# Patient Record
Sex: Female | Born: 1940 | Race: White | Hispanic: No | Smoking: Former smoker
Health system: Southern US, Community
[De-identification: ages and names within clinical notes are randomized; demographics above are authoritative.]

## PROBLEM LIST (undated history)

## (undated) DIAGNOSIS — F329 Major depressive disorder, single episode, unspecified: Secondary | ICD-10-CM

## (undated) DIAGNOSIS — R609 Edema, unspecified: Secondary | ICD-10-CM

## (undated) DIAGNOSIS — K219 Gastro-esophageal reflux disease without esophagitis: Secondary | ICD-10-CM

## (undated) DIAGNOSIS — F32A Depression, unspecified: Secondary | ICD-10-CM

## (undated) DIAGNOSIS — H9190 Unspecified hearing loss, unspecified ear: Secondary | ICD-10-CM

## (undated) DIAGNOSIS — R197 Diarrhea, unspecified: Secondary | ICD-10-CM

## (undated) DIAGNOSIS — L659 Nonscarring hair loss, unspecified: Secondary | ICD-10-CM

## (undated) DIAGNOSIS — M1990 Unspecified osteoarthritis, unspecified site: Secondary | ICD-10-CM

## (undated) DIAGNOSIS — E785 Hyperlipidemia, unspecified: Secondary | ICD-10-CM

## (undated) DIAGNOSIS — T7840XA Allergy, unspecified, initial encounter: Secondary | ICD-10-CM

## (undated) DIAGNOSIS — K52831 Collagenous colitis: Secondary | ICD-10-CM

## (undated) DIAGNOSIS — J45909 Unspecified asthma, uncomplicated: Secondary | ICD-10-CM

## (undated) DIAGNOSIS — I10 Essential (primary) hypertension: Secondary | ICD-10-CM

## (undated) DIAGNOSIS — F419 Anxiety disorder, unspecified: Secondary | ICD-10-CM

## (undated) HISTORY — DX: Hyperlipidemia, unspecified: E78.5

## (undated) HISTORY — DX: Collagenous colitis: K52.831

## (undated) HISTORY — DX: Nonscarring hair loss, unspecified: L65.9

## (undated) HISTORY — PX: SPINE SURGERY: SHX786

## (undated) HISTORY — DX: Edema, unspecified: R60.9

## (undated) HISTORY — DX: Allergy, unspecified, initial encounter: T78.40XA

## (undated) HISTORY — DX: Major depressive disorder, single episode, unspecified: F32.9

## (undated) HISTORY — DX: Unspecified osteoarthritis, unspecified site: M19.90

## (undated) HISTORY — PX: ABDOMINAL HYSTERECTOMY: SHX81

## (undated) HISTORY — DX: Essential (primary) hypertension: I10

## (undated) HISTORY — DX: Depression, unspecified: F32.A

---

## 2014-08-28 ENCOUNTER — Telehealth: Payer: Self-pay | Admitting: Family Medicine

## 2014-09-03 NOTE — Telephone Encounter (Signed)
Several attempts have been made to contact this patient and no answer. This encounter will be closed

## 2014-09-06 ENCOUNTER — Telehealth: Payer: Self-pay | Admitting: General Practice

## 2014-09-10 NOTE — Telephone Encounter (Signed)
Patient has humana. Patient is currently taking amlodipine, trazadone, wellbutrin, patanal, and furosemide. Patient given appointment for 4/28 with Tiffany and then patient would then like to transfer to one of the MD's.

## 2014-09-11 ENCOUNTER — Telehealth: Payer: Self-pay | Admitting: Family Medicine

## 2014-09-11 NOTE — Telephone Encounter (Signed)
Appt made per patient request 

## 2014-09-26 ENCOUNTER — Ambulatory Visit: Payer: Self-pay | Admitting: Physician Assistant

## 2014-10-09 ENCOUNTER — Ambulatory Visit (INDEPENDENT_AMBULATORY_CARE_PROVIDER_SITE_OTHER): Payer: Commercial Managed Care - HMO | Admitting: Family Medicine

## 2014-10-09 ENCOUNTER — Encounter: Payer: Self-pay | Admitting: Family Medicine

## 2014-10-09 ENCOUNTER — Encounter (INDEPENDENT_AMBULATORY_CARE_PROVIDER_SITE_OTHER): Payer: Self-pay

## 2014-10-09 VITALS — BP 148/82 | HR 85 | Temp 97.7°F | Ht 65.0 in | Wt 143.0 lb

## 2014-10-09 DIAGNOSIS — L989 Disorder of the skin and subcutaneous tissue, unspecified: Secondary | ICD-10-CM | POA: Diagnosis not present

## 2014-10-09 DIAGNOSIS — M25561 Pain in right knee: Secondary | ICD-10-CM

## 2014-10-09 DIAGNOSIS — R05 Cough: Secondary | ICD-10-CM

## 2014-10-09 DIAGNOSIS — M1990 Unspecified osteoarthritis, unspecified site: Secondary | ICD-10-CM | POA: Insufficient documentation

## 2014-10-09 DIAGNOSIS — F32A Depression, unspecified: Secondary | ICD-10-CM | POA: Insufficient documentation

## 2014-10-09 DIAGNOSIS — I10 Essential (primary) hypertension: Secondary | ICD-10-CM | POA: Insufficient documentation

## 2014-10-09 DIAGNOSIS — F329 Major depressive disorder, single episode, unspecified: Secondary | ICD-10-CM | POA: Insufficient documentation

## 2014-10-09 DIAGNOSIS — T7840XA Allergy, unspecified, initial encounter: Secondary | ICD-10-CM | POA: Insufficient documentation

## 2014-10-09 DIAGNOSIS — R059 Cough, unspecified: Secondary | ICD-10-CM

## 2014-10-09 DIAGNOSIS — L659 Nonscarring hair loss, unspecified: Secondary | ICD-10-CM | POA: Insufficient documentation

## 2014-10-09 MED ORDER — FUROSEMIDE 20 MG PO TABS: 20.0000 mg | ORAL_TABLET | Freq: Two times a day (BID) | ORAL | Status: DC

## 2014-10-09 MED ORDER — AMLODIPINE BESYLATE 5 MG PO TABS: 5.0000 mg | ORAL_TABLET | Freq: Every day | ORAL | Status: DC

## 2014-10-09 MED ORDER — BUPROPION HCL ER (SR) 150 MG PO TB12: 150.0000 mg | ORAL_TABLET | Freq: Two times a day (BID) | ORAL | Status: DC

## 2014-10-09 MED ORDER — HYDROCOD POLST-CPM POLST ER 10-8 MG/5ML PO SUER: 5.0000 mL | Freq: Two times a day (BID) | ORAL | Status: DC | PRN

## 2014-10-09 MED ORDER — TRAZODONE HCL 150 MG PO TABS: 150.0000 mg | ORAL_TABLET | Freq: Every day | ORAL | Status: DC

## 2014-10-09 MED ORDER — OMEPRAZOLE 20 MG PO CPDR: 20.0000 mg | DELAYED_RELEASE_CAPSULE | Freq: Every day | ORAL | Status: DC

## 2014-10-09 MED ORDER — MECLIZINE HCL 25 MG PO TABS: 25.0000 mg | ORAL_TABLET | Freq: Three times a day (TID) | ORAL | Status: DC | PRN

## 2014-10-09 MED ORDER — MELOXICAM 7.5 MG PO TABS: 7.5000 mg | ORAL_TABLET | Freq: Every day | ORAL | Status: DC

## 2014-10-09 NOTE — Progress Notes (Signed)
   Subjective:    Patient ID: Pamela Hughes, female    DOB: 1940-09-10, 74 y.o.   MRN: [id number]  HPI 74 year old female, first visit here changing doctors because insurance changed. We talked mostly today about her cough which seems to occur every spring and last for 1 or 2 months. In reviewing her records from her previous doctor she has been on antibiotics cough syrup steroids and multiple antihistamines. Cough is accompanied by itching eyes and sneezing and nasal discharge. She has a past history of asthma  She also has a history of hypertension chronic renal disease GERD, anxiety, hair loss, depression, and lumbar spine surgery. She will request her medicines be refilled.    Review of Systems  Constitutional: Negative.   HENT: Positive for congestion and sneezing.   Respiratory: Positive for cough.   Cardiovascular: Negative.   Gastrointestinal: Negative.   Genitourinary: Negative.   Neurological: Negative.   Psychiatric/Behavioral: Negative.        Objective:   Physical Exam  Constitutional: She is oriented to person, place, and time. She appears well-developed and well-nourished.  HENT:  Head: Normocephalic.  Cardiovascular: Normal rate and regular rhythm.   Pulmonary/Chest: Effort normal and breath sounds normal.  Neurological: She is alert and oriented to person, place, and time.  Psychiatric: She has a normal mood and affect.          Assessment & Plan:  1. Cough Office more than likely related to allergies or seasonal. She's been on multiple medications including antibiotics antihistamines steroids all with minimal success. One thing that does help his Tussionex so we'll refill that. I've also asked her to pick up some Allegra over-the-counter. We need to get her back for a complete physical. I have ordered some blood work for later this week for back and get a chance to review her old records in more detail.  Patient Active Problem List   Diagnosis Date Noted    . Hypertension   . Depression   . Allergy   . Alopecia   . Arthritis    Outpatient Encounter Prescriptions as of 10/09/2014  Medication Sig  . amLODipine (NORVASC) 5 MG tablet Take 1 tablet by mouth daily.  Marland Kitchen. buPROPion (WELLBUTRIN SR) 150 MG 12 hr tablet Take 1 tablet by mouth 2 (two) times daily.  . cholecalciferol (VITAMIN D) 1000 UNITS tablet Take 1,000 Units by mouth daily.  . cyclobenzaprine (FLEXERIL) 10 MG tablet Take 10 mg by mouth daily as needed.  . furosemide (LASIX) 20 MG tablet Take 1 tablet by mouth 2 (two) times daily.  . hydroxychloroquine (PLAQUENIL) 200 MG tablet Take 1 tablet by mouth 2 (two) times daily.  Marland Kitchen. ipratropium (ATROVENT) 0.03 % nasal spray Place 1 spray into the nose daily as needed.  . meclizine (ANTIVERT) 25 MG tablet Take 25 mg by mouth 3 (three) times daily as needed for dizziness.  . meloxicam (MOBIC) 7.5 MG tablet Take 7.5 mg by mouth daily.  . Multiple Vitamin (MULTIVITAMIN WITH MINERALS) TABS tablet Take 1 tablet by mouth daily.  . Omega-3 Fatty Acids (FISH OIL PO) Take by mouth.  Marland Kitchen. omeprazole (PRILOSEC) 20 MG capsule Take 1 capsule by mouth daily.  . traZODone (DESYREL) 150 MG tablet Take 1 tablet by mouth at bedtime.   No facility-administered encounter medications on file as of 10/09/2014.

## 2014-10-11 ENCOUNTER — Telehealth: Payer: Self-pay | Admitting: Family Medicine

## 2014-10-11 NOTE — Telephone Encounter (Signed)
Patient has had one visit for a cough.  No other visits documented  Epic.  She is requesting medication refills.

## 2014-10-14 ENCOUNTER — Telehealth: Payer: Self-pay | Admitting: Family Medicine

## 2014-10-14 ENCOUNTER — Other Ambulatory Visit: Payer: Self-pay

## 2014-10-14 DIAGNOSIS — M25562 Pain in left knee: Secondary | ICD-10-CM

## 2014-10-14 MED ORDER — IPRATROPIUM BROMIDE 0.03 % NA SOLN: 1.0000 | Freq: Every day | NASAL | Status: DC | PRN

## 2014-10-14 MED ORDER — AMLODIPINE BESYLATE 5 MG PO TABS: 5.0000 mg | ORAL_TABLET | Freq: Every day | ORAL | Status: DC

## 2014-10-16 NOTE — Telephone Encounter (Signed)
I thought I refilled these 1 or 2 days ago but okay to do if I did not

## 2014-11-08 ENCOUNTER — Other Ambulatory Visit: Payer: Self-pay | Admitting: Family Medicine

## 2014-12-06 ENCOUNTER — Telehealth: Payer: Self-pay | Admitting: Family Medicine

## 2014-12-06 ENCOUNTER — Other Ambulatory Visit (INDEPENDENT_AMBULATORY_CARE_PROVIDER_SITE_OTHER): Payer: Commercial Managed Care - HMO

## 2014-12-06 DIAGNOSIS — I10 Essential (primary) hypertension: Secondary | ICD-10-CM | POA: Diagnosis not present

## 2014-12-07 LAB — BMP8+EGFR
BUN / CREAT RATIO: 12 (ref 11–26)
BUN: 16 mg/dL (ref 8–27)
CO2: 25 mmol/L (ref 18–29)
Calcium: 9.3 mg/dL (ref 8.7–10.3)
Chloride: 96 mmol/L — ABNORMAL LOW (ref 97–108)
Creatinine, Ser: 1.3 mg/dL — ABNORMAL HIGH (ref 0.57–1.00)
GFR calc non Af Amer: 41 mL/min/{1.73_m2} — ABNORMAL LOW (ref >59)
GFR, EST AFRICAN AMERICAN: 47 mL/min/{1.73_m2} — AB (ref >59)
GLUCOSE: 83 mg/dL (ref 65–99)
POTASSIUM: 4.5 mmol/L (ref 3.5–5.2)
Sodium: 138 mmol/L (ref 134–144)

## 2014-12-08 NOTE — Telephone Encounter (Signed)
Rx: Mycelex troches TID # [id number]

## 2014-12-09 MED ORDER — CLOTRIMAZOLE 10 MG MT TROC: 10.0000 mg | Freq: Every day | OROMUCOSAL | Status: DC

## 2014-12-09 NOTE — Telephone Encounter (Signed)
Detailed message left for patient that rx has been sent to pharmacy. 

## 2014-12-10 ENCOUNTER — Telehealth: Payer: Self-pay | Admitting: *Deleted

## 2014-12-10 NOTE — Telephone Encounter (Signed)
-----   Message from Frederica KusterStephen M Miller, MD sent at 12/08/2014  8:13 PM EDT ----- Has CKD and history of same; do not have any old labs for comparison

## 2014-12-11 ENCOUNTER — Other Ambulatory Visit: Payer: Self-pay | Admitting: Family Medicine

## 2014-12-13 ENCOUNTER — Encounter: Payer: Self-pay | Admitting: Family Medicine

## 2014-12-13 ENCOUNTER — Encounter (INDEPENDENT_AMBULATORY_CARE_PROVIDER_SITE_OTHER): Payer: Self-pay

## 2014-12-13 ENCOUNTER — Ambulatory Visit (INDEPENDENT_AMBULATORY_CARE_PROVIDER_SITE_OTHER): Payer: Commercial Managed Care - HMO

## 2014-12-13 ENCOUNTER — Ambulatory Visit (INDEPENDENT_AMBULATORY_CARE_PROVIDER_SITE_OTHER): Payer: Commercial Managed Care - HMO | Admitting: Family Medicine

## 2014-12-13 ENCOUNTER — Ambulatory Visit: Payer: Commercial Managed Care - HMO | Admitting: Family Medicine

## 2014-12-13 VITALS — BP 145/86 | HR 90 | Temp 98.4°F | Ht 66.0 in | Wt 142.0 lb

## 2014-12-13 DIAGNOSIS — Z780 Asymptomatic menopausal state: Secondary | ICD-10-CM

## 2014-12-13 DIAGNOSIS — T7840XD Allergy, unspecified, subsequent encounter: Secondary | ICD-10-CM

## 2014-12-13 DIAGNOSIS — I10 Essential (primary) hypertension: Secondary | ICD-10-CM

## 2014-12-13 DIAGNOSIS — Z0000 Encounter for general adult medical examination without abnormal findings: Secondary | ICD-10-CM | POA: Diagnosis not present

## 2014-12-13 MED ORDER — OMEPRAZOLE 20 MG PO CPDR: 20.0000 mg | DELAYED_RELEASE_CAPSULE | Freq: Every day | ORAL | Status: DC

## 2014-12-13 MED ORDER — CLOTRIMAZOLE 10 MG MT TROC: 10.0000 mg | Freq: Every day | OROMUCOSAL | Status: DC

## 2014-12-13 MED ORDER — BUPROPION HCL ER (SR) 150 MG PO TB12: 150.0000 mg | ORAL_TABLET | Freq: Two times a day (BID) | ORAL | Status: DC

## 2014-12-13 MED ORDER — FUROSEMIDE 20 MG PO TABS: 20.0000 mg | ORAL_TABLET | Freq: Two times a day (BID) | ORAL | Status: DC

## 2014-12-13 MED ORDER — MELOXICAM 7.5 MG PO TABS: 7.5000 mg | ORAL_TABLET | Freq: Every day | ORAL | Status: DC

## 2014-12-13 MED ORDER — AMLODIPINE BESYLATE 5 MG PO TABS: 5.0000 mg | ORAL_TABLET | Freq: Every day | ORAL | Status: DC

## 2014-12-13 NOTE — Progress Notes (Signed)
Subjective:    Patient ID: Pamela Hughes, female    DOB: 05-14-1941, 74 y.o.   MRN: [id number]  HPI 74 year old female here for physical. This is her second visit here. She had come last week for some blood work which basically showed some stage III, chronic kidney disease really unchanged from last blood work in March. We did not check lipids. Review of lab work from prior physician shows a really high level of HDL and her LDL was only mildly elevated but not high enough to treat. She continues to have intermittent thrush. She also has chronic cough which sounds allergic she is been on several any histamines as well as Singulair. There is no asthma by history she also has hypertension and takes amlodipine.  Patient Active Problem List   Diagnosis Date Noted  . Hypertension   . Depression   . Allergy   . Alopecia   . Arthritis    Outpatient Encounter Prescriptions as of 12/13/2014  Medication Sig  . amLODipine (NORVASC) 5 MG tablet TAKE ONE (1) TABLET EACH DAY  . buPROPion (WELLBUTRIN SR) 150 MG 12 hr tablet Take 1 tablet (150 mg total) by mouth 2 (two) times daily.  . cholecalciferol (VITAMIN D) 1000 UNITS tablet Take 1,000 Units by mouth daily.  . clobetasol ointment (TEMOVATE) 0.05 % APPLY TO AFFECTED AREA DAILY  . clotrimazole (MYCELEX) 10 MG troche Take 1 tablet (10 mg total) by mouth 5 (five) times daily.  . cyclobenzaprine (FLEXERIL) 10 MG tablet Take 10 mg by mouth daily as needed.  . furosemide (LASIX) 20 MG tablet Take 1 tablet (20 mg total) by mouth 2 (two) times daily.  . hydroxychloroquine (PLAQUENIL) 200 MG tablet Take 1 tablet by mouth 2 (two) times daily.  Marland Kitchen. ipratropium (ATROVENT) 0.03 % nasal spray Place 1 spray into the nose daily as needed.  Marland Kitchen. ketoconazole (NIZORAL) 2 % cream APPLY ONCE DAILY FOR 3 WEEKS AS DIRECTED  . meclizine (ANTIVERT) 25 MG tablet Take 1 tablet (25 mg total) by mouth 3 (three) times daily as needed for dizziness.  . meloxicam (MOBIC) 7.5 MG tablet  Take 1 tablet (7.5 mg total) by mouth daily.  . Multiple Vitamin (MULTIVITAMIN WITH MINERALS) TABS tablet Take 1 tablet by mouth daily.  . Omega-3 Fatty Acids (FISH OIL PO) Take by mouth.  Marland Kitchen. omeprazole (PRILOSEC) 20 MG capsule Take 1 capsule (20 mg total) by mouth daily.  . traZODone (DESYREL) 150 MG tablet TAKE ONE TABLET DAILY AT BEDTIME  . [DISCONTINUED] chlorpheniramine-HYDROcodone (TUSSIONEX PENNKINETIC ER) 10-8 MG/5ML SUER Take 5 mLs by mouth every 12 (twelve) hours as needed for cough.  Marland Kitchen. HYDROcodone-acetaminophen (NORCO/VICODIN) 5-325 MG per tablet   . [DISCONTINUED] ketoconazole (NIZORAL) 2 % cream APPLY TO AFFECTED AREA TWICE DAILY AS NEEDED   No facility-administered encounter medications on file as of 12/13/2014.      Review of Systems  Constitutional: Negative.   Respiratory: Positive for cough.   Cardiovascular: Negative.   Gastrointestinal: Negative.   Genitourinary: Negative.   Neurological: Negative.   Psychiatric/Behavioral: Negative.        Objective:   Physical Exam  Constitutional: She is oriented to person, place, and time. She appears well-developed and well-nourished.  HENT:  Head: Normocephalic.  Mouth/Throat: Oropharynx is clear and moist.  Eyes: Pupils are equal, round, and reactive to light.  Neck: Normal range of motion.  Cardiovascular: Normal rate and regular rhythm.   Pulmonary/Chest: Effort normal and breath sounds normal.  Abdominal: Soft. She  exhibits no mass. There is no tenderness.  Musculoskeletal: Normal range of motion.  Neurological: She is alert and oriented to person, place, and time.     BP 145/86 mmHg  Pulse 90  Temp(Src) 98.4 F (36.9 C) (Oral)  Ht  (1.676 m)  Wt 142 lb (64.411 kg)  BMI 22.93 kg/m2      Assessment & Plan:  1. Post-menopausal Been some years since she's had bone density so we will do that today.  - DG Bone Density; Future - MM DIGITAL SCREENING BILATERAL; Future   2. Essential  hypertension Blood pressure adequately controlled on amlodipine  3. Allergy, subsequent encounter Chronic cough. Suggested switching from Allegra to Zyrtec and sterilely nasal spray such as Flonase or Nasonex  Frederica Kuster MD

## 2014-12-17 NOTE — Progress Notes (Signed)
T score -1.1. Consistent with osteopenia. Would recommend calcium thousand milligrams and vitamin D 05-1998 units per day

## 2014-12-26 ENCOUNTER — Telehealth: Payer: Self-pay | Admitting: Family Medicine

## 2014-12-26 NOTE — Telephone Encounter (Signed)
Details on BMP left

## 2015-01-15 ENCOUNTER — Telehealth: Payer: Self-pay | Admitting: Family Medicine

## 2015-01-31 ENCOUNTER — Encounter: Payer: Self-pay | Admitting: Family Medicine

## 2015-01-31 ENCOUNTER — Ambulatory Visit (INDEPENDENT_AMBULATORY_CARE_PROVIDER_SITE_OTHER): Payer: Commercial Managed Care - HMO | Admitting: Family Medicine

## 2015-01-31 ENCOUNTER — Ambulatory Visit (INDEPENDENT_AMBULATORY_CARE_PROVIDER_SITE_OTHER): Payer: Commercial Managed Care - HMO

## 2015-01-31 VITALS — BP 148/83 | HR 89 | Temp 97.4°F | Ht 66.0 in | Wt 143.2 lb

## 2015-01-31 DIAGNOSIS — M79672 Pain in left foot: Secondary | ICD-10-CM

## 2015-01-31 DIAGNOSIS — J309 Allergic rhinitis, unspecified: Secondary | ICD-10-CM

## 2015-01-31 MED ORDER — TRIAMCINOLONE ACETONIDE 40 MG/ML IJ SUSP
40.0000 mg | Freq: Once | INTRAMUSCULAR | Status: AC
  Administered 2015-01-31: 40 mg via INTRAMUSCULAR

## 2015-01-31 MED ORDER — AZELASTINE HCL 0.1 % NA SOLN: 1.0000 | Freq: Two times a day (BID) | NASAL | Status: DC

## 2015-01-31 NOTE — Progress Notes (Signed)
   HPI  Patient presents today or evaluation of left foot pain, also allergic rhinitis  Left foot pain New problem Patient explains that over the last month or 2 she's had intermittent left foot/heel pain whenever she wakes up. She states that over the ast week she's developed continuous left heel pain described as dull continuous pain worsened with walking She denies any traumaor previous pain similar to this. She's been adding our support to her shoes with no improvement. She's also tried a foot stretchwhich has not helped her.  Allergic rhinitis, non productive cough for approx 1 year She describes  Persistent aggravating cough, itchy throat,intermittent ear painand crunching sounds in her ears, anditching eyes. She states that Allegra helps some but Claritin and Zyrtec do not help her. She's tried Atrovent nasal spray with mild improvement. She requests another medication today  PMH: Smoking status noted ROS: Per HPI  Objective: BP 148/83 mmHg  Pulse 89  Temp(Src) 97.4 F (36.3 C) (Oral)  Ht  (1.676 m)  Wt 143 lb 3.2 oz (64.955 kg)  BMI 23.12 kg/m2 Gen: NAD, alert, cooperative with exam HEENT: NCAT, ropharynx clear, TMs WL BL,nares clear CV: RRR, good S1/S2, no murmur Resp: CTABL, no wheezes, non-labored Abd: SNTND, BS present, no guarding or organomegaly Ext: No edema, warm Neuro: Alert and oriented, No gross deficits Musculoskeletal: No lesions, swelling, or erythema of the left foot, no tenderness to palpation of the insertion of the plantar fascia  Plain film of foot 01/31/2015- Heel spurs, no fracture, awaiting radiology read     Assessment and plan:  # Left foot pain Likely plantar fasciitis, XR with constant pain to r/o fracture and heel spur Already on daily NSAIDs, Given a shot of kenalog in the clinic Ice, stretches, handout given with illustrations  # Allergic rhinitis, chronic cough With ear symptoms and itchy eyes probably due to allergic rhinitis  with postnasal drip Change from Atrovent to Astelin nasal spray Continue Allegra   Orders Placed This Encounter  Procedures  . DG Foot Complete Left    Standing Status: Future     Number of Occurrences:      Standing Expiration Date: 04/01/2016    Order Specific Question:  Reason for Exam (SYMPTOM  OR DIAGNOSIS REQUIRED)    Answer:  heel pain    Order Specific Question:  Preferred imaging location?    Answer:  Internal    Meds ordered this encounter  Medications  . azelastine (ASTELIN) 0.1 % nasal spray    Sig: Place 1-2 sprays into both nostrils 2 (two) times daily. Use in each nostril as directed    Dispense:  30 mL    Refill:  12    Murtis Sink, MD [facility] 01/31/2015, 1:33 PM

## 2015-01-31 NOTE — Patient Instructions (Signed)
Great to meet you!  Try the astelin spray for your nose, it will probably help your ears as well.    Try the stretches for your foot, it looks like you have plantar fascitis

## 2015-03-07 ENCOUNTER — Telehealth: Payer: Self-pay | Admitting: Family Medicine

## 2015-03-07 DIAGNOSIS — M79672 Pain in left foot: Secondary | ICD-10-CM

## 2015-03-07 MED ORDER — TRAZODONE HCL 150 MG PO TABS: 150.0000 mg | ORAL_TABLET | Freq: Every day | ORAL | Status: DC

## 2015-03-07 MED ORDER — AMLODIPINE BESYLATE 5 MG PO TABS: 5.0000 mg | ORAL_TABLET | Freq: Every day | ORAL | Status: DC

## 2015-03-07 MED ORDER — AZELASTINE HCL 0.1 % NA SOLN: 1.0000 | Freq: Two times a day (BID) | NASAL | Status: DC

## 2015-03-07 MED ORDER — OMEPRAZOLE 20 MG PO CPDR: 20.0000 mg | DELAYED_RELEASE_CAPSULE | Freq: Every day | ORAL | Status: DC

## 2015-03-07 NOTE — Telephone Encounter (Signed)
done

## 2015-03-17 ENCOUNTER — Telehealth: Payer: Self-pay | Admitting: Family Medicine

## 2015-03-19 NOTE — Telephone Encounter (Signed)
appt made

## 2015-03-28 ENCOUNTER — Encounter: Payer: Self-pay | Admitting: Family Medicine

## 2015-03-28 ENCOUNTER — Ambulatory Visit (INDEPENDENT_AMBULATORY_CARE_PROVIDER_SITE_OTHER): Payer: Commercial Managed Care - HMO | Admitting: Family Medicine

## 2015-03-28 VITALS — BP 134/80 | HR 92 | Temp 98.5°F | Ht 66.0 in | Wt 144.4 lb

## 2015-03-28 DIAGNOSIS — L661 Lichen planopilaris: Secondary | ICD-10-CM | POA: Diagnosis not present

## 2015-03-28 DIAGNOSIS — M79672 Pain in left foot: Secondary | ICD-10-CM | POA: Diagnosis not present

## 2015-03-28 DIAGNOSIS — I10 Essential (primary) hypertension: Secondary | ICD-10-CM | POA: Diagnosis not present

## 2015-03-28 MED ORDER — TRAZODONE HCL 150 MG PO TABS: 150.0000 mg | ORAL_TABLET | Freq: Every day | ORAL | Status: DC

## 2015-03-28 MED ORDER — HYDROXYCHLOROQUINE SULFATE 200 MG PO TABS: 200.0000 mg | ORAL_TABLET | Freq: Two times a day (BID) | ORAL | Status: DC

## 2015-03-28 MED ORDER — OMEPRAZOLE 20 MG PO CPDR: 20.0000 mg | DELAYED_RELEASE_CAPSULE | Freq: Every day | ORAL | Status: DC

## 2015-03-28 MED ORDER — KETOCONAZOLE 2 % EX SHAM: 1.0000 "application " | MEDICATED_SHAMPOO | CUTANEOUS | Status: DC

## 2015-03-28 MED ORDER — KETOCONAZOLE 2 % EX CREA
TOPICAL_CREAM | CUTANEOUS | Status: DC
Start: 2015-03-28 — End: 2015-07-11

## 2015-03-28 NOTE — Progress Notes (Signed)
HPI  Patient presents today for follow-up of left foot pain and refills.  Left foot pain Previously was left heel pain which improved after last visit. She has now had sevral weeks Hx of arch pain that sometimes radiates to her lateral foot She states that she also has some mild swelling with it. She denies any erythema, fever, injury to the area She would like to see Dr. Irving Shows, our local podiatrist  Skin nodule Right nose skin nodule, present for 6+ months and not changing. No drainage, tenderness, or redness.  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 by dermatology several years ago She would like a refill on Plaquenil today  Cough Says she has cough, slightly improved with Allegra She's also using Astelin and ipratropium nasal sprays Is worse in the morning and productive She denies any dyspnea or illness- fever or malaise  PMH: Smoking status noted ROS: Per HPI  Objective: BP 134/80 mmHg  Pulse 92  Temp(Src) 98.5 F (36.9 C) (Oral)  Ht _0  (1.676 m)  Wt 144 lb 6.4 oz (65.499 kg)  BMI 23.32 kg/m2 Gen: NAD, alert, cooperative with exam HEENT: NCAT,  nares clear, small approximately 1-2 mm papule that is why on her right nares verge \CV: RRR, good S1/S2, no murmur Resp: CTABL, no wheezes, non-labored Ext: No edema, warm Neuro: Alert and oriented, No gross deficits Skin: Approximately 0.7 cm irregularly-shaped subcutaneous nodule in the right nose, no overlying erythema, tenderness to palpation, or change in skin color   Assessment and plan:  # Left foot pain Previously consistent with plantar fasciitis which seems to resolve. Now she has arch pain Recommended good arch support in her shoes and stretching Refer to podiatry  # fibrosing alopecia Refill Plaquenil Recommend annual eye exams, ask her to have her eye exams forward to me I also asked her to send the original records from dermatology CMP today  # HTN Controlled on  repeat check Continue current meds Labs    Orders Placed This Encounter  Procedures  . CMP14+EGFR  . CBC with Differential  . Lipid panel    Standing Status: Future     Number of Occurrences: 1     Standing Expiration Date: 03/27/2016  . Ambulatory referral to Podiatry    Referral Priority:  Routine    Referral Type:  Consultation    Referral Reason:  Specialty Services Required    Requested Specialty:  Podiatry    Number of Visits Requested:  1    Meds ordered this encounter  Medications  . hydroxychloroquine (PLAQUENIL) 200 MG tablet    Sig: Take 1 tablet (200 mg total) by mouth 2 (two) times daily.    Dispense:  60 tablet    Refill:  1  . ketoconazole (NIZORAL) 2 % cream    Sig: APPLY ONCE DAILY FOR 3 WEEKS AS DIRECTED    Dispense:  60 g    Refill:  3  . ketoconazole (NIZORAL) 2 % shampoo    Sig: Apply 1 application topically 2 (two) times a week.    Dispense:  120 mL    Refill:  3  . omeprazole (PRILOSEC) 20 MG capsule    Sig: Take 1 capsule (20 mg total) by mouth daily.    Dispense:  30 capsule    Refill:  2  . traZODone (DESYREL) 150 MG tablet    Sig: Take 1 tablet (150 mg total) by mouth at bedtime.    Dispense:  30  tablet    Refill:  [place], MD [facility] 03/28/2015, 1:43 PM

## 2015-03-28 NOTE — Patient Instructions (Signed)
Great to see you!  Please have recoprds from the rheumatologist sent to me, I would like to know specifically why they prescribed plaquenil You should have annual eye exams while using this medicine.   You will hear from us in the next week or so for  A podiatry appointment

## 2015-03-29 LAB — CBC WITH DIFFERENTIAL/PLATELET
BASOS ABS: 0 10*3/uL (ref 0.0–0.2)
Basos: 0 %
EOS (ABSOLUTE): 0.2 10*3/uL (ref 0.0–0.4)
Eos: 3 %
HEMOGLOBIN: 12.9 g/dL (ref 11.1–15.9)
Hematocrit: 37 % (ref 34.0–46.6)
IMMATURE GRANS (ABS): 0 10*3/uL (ref 0.0–0.1)
Immature Granulocytes: 0 %
LYMPHS: 27 %
Lymphocytes Absolute: 1.8 10*3/uL (ref 0.7–3.1)
MCH: 33.8 pg — AB (ref 26.6–33.0)
MCHC: 34.9 g/dL (ref 31.5–35.7)
MCV: 97 fL (ref 79–97)
Monocytes Absolute: 0.9 10*3/uL (ref 0.1–0.9)
Monocytes: 13 %
NEUTROS ABS: 3.9 10*3/uL (ref 1.4–7.0)
NEUTROS PCT: 57 %
PLATELETS: 396 10*3/uL — AB (ref 150–379)
RBC: 3.82 x10E6/uL (ref 3.77–5.28)
RDW: 13.9 % (ref 12.3–15.4)
WBC: 6.7 10*3/uL (ref 3.4–10.8)

## 2015-03-29 LAB — CMP14+EGFR
A/G RATIO: 1.8 (ref 1.1–2.5)
ALK PHOS: 98 IU/L (ref 39–117)
ALT: 21 IU/L (ref 0–32)
AST: 24 IU/L (ref 0–40)
Albumin: 4.1 g/dL (ref 3.5–4.8)
BUN/Creatinine Ratio: 14 (ref 11–26)
BUN: 18 mg/dL (ref 8–27)
CHLORIDE: 94 mmol/L — AB (ref 97–106)
CO2: 29 mmol/L (ref 18–29)
Calcium: 9.2 mg/dL (ref 8.7–10.3)
Creatinine, Ser: 1.27 mg/dL — ABNORMAL HIGH (ref 0.57–1.00)
GFR calc Af Amer: 48 mL/min/{1.73_m2} — ABNORMAL LOW (ref >59)
GFR calc non Af Amer: 42 mL/min/{1.73_m2} — ABNORMAL LOW (ref >59)
GLUCOSE: 108 mg/dL — AB (ref 65–99)
Globulin, Total: 2.3 g/dL (ref 1.5–4.5)
POTASSIUM: 4.2 mmol/L (ref 3.5–5.2)
Sodium: 137 mmol/L (ref 136–144)
TOTAL PROTEIN: 6.4 g/dL (ref 6.0–8.5)

## 2015-03-29 LAB — LIPID PANEL
CHOLESTEROL TOTAL: 267 mg/dL — AB (ref 100–199)
Chol/HDL Ratio: 2.2 ratio units (ref 0.0–4.4)
HDL: 124 mg/dL (ref >39)
LDL CALC: 128 mg/dL — AB (ref 0–99)
TRIGLYCERIDES: 74 mg/dL (ref 0–149)
VLDL CHOLESTEROL CAL: 15 mg/dL (ref 5–40)

## 2015-04-04 ENCOUNTER — Other Ambulatory Visit: Payer: Self-pay | Admitting: Family Medicine

## 2015-04-04 DIAGNOSIS — M79672 Pain in left foot: Secondary | ICD-10-CM

## 2015-04-04 MED ORDER — AMLODIPINE BESYLATE 5 MG PO TABS: 5.0000 mg | ORAL_TABLET | Freq: Every day | ORAL | Status: DC

## 2015-04-04 MED ORDER — AZELASTINE HCL 0.1 % NA SOLN: 1.0000 | Freq: Two times a day (BID) | NASAL | Status: DC

## 2015-05-02 ENCOUNTER — Other Ambulatory Visit: Payer: Self-pay | Admitting: Family Medicine

## 2015-06-06 ENCOUNTER — Other Ambulatory Visit: Payer: Self-pay | Admitting: Family Medicine

## 2015-06-13 ENCOUNTER — Telehealth: Payer: Self-pay | Admitting: Family Medicine

## 2015-06-27 ENCOUNTER — Encounter: Payer: Self-pay | Admitting: Family Medicine

## 2015-06-27 ENCOUNTER — Ambulatory Visit (INDEPENDENT_AMBULATORY_CARE_PROVIDER_SITE_OTHER): Payer: Medicare Other | Admitting: Family Medicine

## 2015-06-27 VITALS — BP 144/81 | HR 81 | Temp 97.2°F | Ht 66.0 in | Wt 142.0 lb

## 2015-06-27 DIAGNOSIS — R1030 Lower abdominal pain, unspecified: Secondary | ICD-10-CM | POA: Diagnosis not present

## 2015-06-27 DIAGNOSIS — R600 Localized edema: Secondary | ICD-10-CM

## 2015-06-27 DIAGNOSIS — M19041 Primary osteoarthritis, right hand: Secondary | ICD-10-CM | POA: Insufficient documentation

## 2015-06-27 DIAGNOSIS — M19042 Primary osteoarthritis, left hand: Secondary | ICD-10-CM

## 2015-06-27 MED ORDER — DICLOFENAC SODIUM 1 % TD GEL: 2.0000 g | Freq: Four times a day (QID) | TRANSDERMAL | Status: DC

## 2015-06-27 NOTE — Patient Instructions (Addendum)
Great to see you!  Stop mobic, you can take aleve (1 pill) once or twice daily instead.   Try Ted hoses for swelling. Keep the fluid pill at once a day  Continue your over the counter gas medicine as needed.

## 2015-06-27 NOTE — Progress Notes (Signed)
   HPI  Patient presents today here today for several complaints.  Patient explains she had bilateral foot swelling 2 months. She's had chronic right foot swelling which she's been taking Lasix for for several years. She has developed a left foot swelling as well over the last couple months. She denies any dyspnea or pain with swelling. She also denies any rash or itching or irritation. Considered using compression hoses that has not bought them.  Arthritis pain. Requests Voltaren gel She would like to discuss meloxicam and if this is a good idea long-term. She uses Meloxican which helps for part of the day, she uses this about 4 pills per week.  Amlodipine is not a new medication.  Gas pains and diarrhea. A few days a week she has loose stools which she uses Imodium for. Occasionally she has sharp lower abdominal gas pains which resolved after bowel movement. She uses phazyme with resolution.   PMH: Smoking status noted ROS: Per HPI  Objective: BP 144/81 mmHg  Pulse 81  Temp(Src) 97.2 F (36.2 C) (Oral)  Ht  (1.676 m)  Wt 142 lb (64.411 kg)  BMI 22.93 kg/m2 Gen: NAD, alert, cooperative with exam HEENT: NCAT CV: RRR, good S1/S2, no murmur Resp: CTABL, no wheezes, non-labored Ext: 1+ Pitting edema on LLE, R less swollen but larger- trace edema on R Neuro: Alert and oriented, No gross deficits  Assessment and plan:  # Lower extremity edema Long-standing right greater than left edema, her left is now swelling some as well. Continue Lasix at once daily dosing Try TED hose  # Osteoarthritis Voltaren gel Discontinue mobic Aleve 1 pill up to twice a day  # Gas pains Encouraged her to use over-the-counter Phazyme as this relieves her symptoms.  Murtis Sink, MD [facility] 06/27/2015, 2:45 PM

## 2015-07-01 ENCOUNTER — Telehealth: Payer: Self-pay

## 2015-07-01 NOTE — Telephone Encounter (Signed)
Insurance prior authorized Diclofenac gel through 05/30/16

## 2015-07-11 ENCOUNTER — Other Ambulatory Visit: Payer: Self-pay | Admitting: Family Medicine

## 2015-07-11 DIAGNOSIS — H3589 Other specified retinal disorders: Secondary | ICD-10-CM | POA: Diagnosis not present

## 2015-07-11 DIAGNOSIS — H2513 Age-related nuclear cataract, bilateral: Secondary | ICD-10-CM | POA: Diagnosis not present

## 2015-07-11 DIAGNOSIS — H04123 Dry eye syndrome of bilateral lacrimal glands: Secondary | ICD-10-CM | POA: Diagnosis not present

## 2015-07-11 DIAGNOSIS — H35363 Drusen (degenerative) of macula, bilateral: Secondary | ICD-10-CM | POA: Diagnosis not present

## 2015-08-15 ENCOUNTER — Other Ambulatory Visit: Payer: Self-pay | Admitting: Family Medicine

## 2015-09-12 ENCOUNTER — Other Ambulatory Visit: Payer: Self-pay | Admitting: Family Medicine

## 2015-10-17 ENCOUNTER — Other Ambulatory Visit: Payer: Self-pay | Admitting: Family Medicine

## 2015-11-14 ENCOUNTER — Other Ambulatory Visit: Payer: Self-pay | Admitting: Family Medicine

## 2015-12-10 ENCOUNTER — Ambulatory Visit (INDEPENDENT_AMBULATORY_CARE_PROVIDER_SITE_OTHER): Payer: Medicare Other | Admitting: Family Medicine

## 2015-12-10 ENCOUNTER — Encounter: Payer: Self-pay | Admitting: Family Medicine

## 2015-12-10 VITALS — BP 121/72 | HR 87 | Temp 97.6°F | Ht 66.0 in | Wt 141.6 lb

## 2015-12-10 DIAGNOSIS — R5381 Other malaise: Secondary | ICD-10-CM | POA: Diagnosis not present

## 2015-12-10 DIAGNOSIS — I10 Essential (primary) hypertension: Secondary | ICD-10-CM | POA: Diagnosis not present

## 2015-12-10 DIAGNOSIS — R5383 Other fatigue: Secondary | ICD-10-CM | POA: Diagnosis not present

## 2015-12-10 NOTE — Progress Notes (Signed)
Subjective:    Patient ID: Pamela Hughes, female    DOB: 18-Jan-1941, 75 y.o.   MRN: [id number]  HPI  Patient is here today with a knot on the left side of her neck. Patient also is complaining with nausea, dizziness, and feeling fatigue. She had some pain in her left ear prior to the onset of the swelling at the angle of the jaw. She also has some dizziness and takes meclizine. There is a positional component.  Review of Systems  Constitutional: Positive for fatigue.  HENT: Negative.   Eyes: Negative.   Respiratory: Negative.   Cardiovascular: Negative.   Gastrointestinal: Negative.   Endocrine: Negative.   Genitourinary: Negative.   Musculoskeletal: Negative.   Skin: Negative.   Allergic/Immunologic: Negative.   Neurological: Positive for dizziness.  Hematological: Negative.   Psychiatric/Behavioral: Negative.        Depression screen [facility] 2/9 12/10/2015 12/10/2015 06/27/2015 10/09/2014  Decreased Interest - - 0 3  Down, Depressed, Hopeless 2 0 1 3  PHQ - 2 Score 2 0 1 6  Altered sleeping 3 - - 3  Tired, decreased energy 3 - - 3  Change in appetite 0 - - 0  Feeling bad or failure about yourself  2 - - 3  Trouble concentrating 1 - - 3  Moving slowly or fidgety/restless 0 - - 0  Suicidal thoughts 0 - - 0  PHQ-9 Score 11 - - 18  Difficult doing work/chores Not difficult at all - - -         Patient Active Problem List   Diagnosis Date Noted  . Osteoarthritis of both hands 06/27/2015  . 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 Left foot pain 01/31/2015  . Allergic rhinitis 01/31/2015  . Hypertension   . Depression   . Allergy   . Alopecia   . Arthritis    Outpatient Encounter Prescriptions as of 12/10/2015  Medication Sig  . amLODipine (NORVASC) 5 MG tablet Take 1 tablet (5 mg total) by mouth daily.  Marland Kitchen azelastine (ASTELIN) 0.1 % nasal spray Place 1-2 sprays into both nostrils 2 (two) times daily. Use in each nostril as directed  . buPROPion (WELLBUTRIN SR) 150  MG 12 hr tablet TAKE ONE TABLET BY MOUTH TWICE DAILY  . cholecalciferol (VITAMIN D) 1000 UNITS tablet Take 1,000 Units by mouth daily.  . clotrimazole (MYCELEX) 10 MG troche TAKE 1 TABLET 5 TIMES DAILY (Patient taking differently: TAKE 1 TABLET 5 TIMES DAILY as needed)  . cyclobenzaprine (FLEXERIL) 10 MG tablet Take 10 mg by mouth daily as needed.  . furosemide (LASIX) 20 MG tablet TAKE ONE TABLET BY MOUTH TWICE DAILY  . ipratropium (ATROVENT) 0.03 % nasal spray USE AS DIRECTED TWICE DAILY  . ketoconazole (NIZORAL) 2 % cream APPLY ONCE DAILY FOR 3 WEEKS AS DIRECTED  . ketoconazole (NIZORAL) 2 % shampoo APPLY 1 APPLICATION TOPICALLY 2 TIMES A WEEK  . meloxicam (MOBIC) 7.5 MG tablet TAKE ONE (1) TABLET EACH DAY (Patient taking differently: TAKE ONE (1) TABLET EACH DAY as needed)  . Multiple Vitamin (MULTIVITAMIN WITH MINERALS) TABS tablet Take 1 tablet by mouth daily.  . Omega-3 Fatty Acids (FISH OIL PO) Take by mouth.  Marland Kitchen omeprazole (PRILOSEC) 20 MG capsule Take 1 capsule (20 mg total) by mouth daily.  . traZODone (DESYREL) 150 MG tablet TAKE ONE TABLET DAILY AT BEDTIME  . [DISCONTINUED] diclofenac sodium (VOLTAREN) 1 % GEL Apply 2 g topically 4 (four) times daily. (Patient not taking: Reported on  12/10/2015)  . [DISCONTINUED] hydroxychloroquine (PLAQUENIL) 200 MG tablet TAKE ONE TABLET BY MOUTH TWICE DAILY (Patient not taking: Reported on 12/10/2015)   No facility-administered encounter medications on file as of 12/10/2015.     Objective:   Physical Exam  Constitutional: She is oriented to person, place, and time. She appears well-developed and well-nourished.  HENT:  There are some confluent lymph nodes at the left angle of the jaw. They are not really firm or hard or feel pathologic. The ear on that side is dull as in a serous effusion  Cardiovascular: Normal rate and regular rhythm.   Pulmonary/Chest: Effort normal and breath sounds normal.  Abdominal: Soft.  Neurological: She is alert and  oriented to person, place, and time.    BP 121/72 mmHg  Pulse 87  Temp(Src) 97.6 F (36.4 C) (Oral)  Ht '5\' 6"'  (1.676 m)  Wt 141 lb 9.6 oz (64.229 kg)  BMI 22.87 kg/m2       Assessment & Plan:  1. Essential hypertension Blood pressure is good today at 121/72 - BMP8+EGFR  2. Malaise and fatigue She'll request that her arm rechecked I told her we will check her hemoglobin and also look at potassium and thyroid as a calls for her lack of energy and weakness  Wardell Honour MD - CBC with Differential/Platelet - TSH

## 2015-12-11 LAB — CBC WITH DIFFERENTIAL/PLATELET
Basophils Absolute: 0 10*3/uL (ref 0.0–0.2)
Basos: 1 %
EOS (ABSOLUTE): 0.2 10*3/uL (ref 0.0–0.4)
Eos: 4 %
HEMATOCRIT: 36.2 % (ref 34.0–46.6)
HEMOGLOBIN: 12.4 g/dL (ref 11.1–15.9)
IMMATURE GRANULOCYTES: 0 %
Immature Grans (Abs): 0 10*3/uL (ref 0.0–0.1)
LYMPHS: 36 %
Lymphocytes Absolute: 2 10*3/uL (ref 0.7–3.1)
MCH: 31.9 pg (ref 26.6–33.0)
MCHC: 34.3 g/dL (ref 31.5–35.7)
MCV: 93 fL (ref 79–97)
Monocytes Absolute: 0.6 10*3/uL (ref 0.1–0.9)
Monocytes: 11 %
NEUTROS ABS: 2.7 10*3/uL (ref 1.4–7.0)
Neutrophils: 48 %
Platelets: 399 10*3/uL — ABNORMAL HIGH (ref 150–379)
RBC: 3.89 x10E6/uL (ref 3.77–5.28)
RDW: 13.2 % (ref 12.3–15.4)
WBC: 5.6 10*3/uL (ref 3.4–10.8)

## 2015-12-11 LAB — BMP8+EGFR
BUN/Creatinine Ratio: 15 (ref 12–28)
BUN: 21 mg/dL (ref 8–27)
CALCIUM: 9.1 mg/dL (ref 8.7–10.3)
CO2: 27 mmol/L (ref 18–29)
CREATININE: 1.41 mg/dL — AB (ref 0.57–1.00)
Chloride: 96 mmol/L (ref 96–106)
GFR, EST AFRICAN AMERICAN: 42 mL/min/{1.73_m2} — AB (ref >59)
GFR, EST NON AFRICAN AMERICAN: 36 mL/min/{1.73_m2} — AB (ref >59)
Glucose: 105 mg/dL — ABNORMAL HIGH (ref 65–99)
POTASSIUM: 4.4 mmol/L (ref 3.5–5.2)
Sodium: 139 mmol/L (ref 134–144)

## 2015-12-11 LAB — TSH: TSH: 1.17 u[IU]/mL (ref 0.450–4.500)

## 2015-12-12 ENCOUNTER — Other Ambulatory Visit: Payer: Self-pay | Admitting: *Deleted

## 2015-12-12 DIAGNOSIS — N189 Chronic kidney disease, unspecified: Secondary | ICD-10-CM

## 2015-12-18 ENCOUNTER — Other Ambulatory Visit: Payer: Self-pay | Admitting: Family Medicine

## 2016-01-15 ENCOUNTER — Other Ambulatory Visit: Payer: Self-pay | Admitting: Family Medicine

## 2016-02-17 ENCOUNTER — Other Ambulatory Visit: Payer: Self-pay | Admitting: Family Medicine

## 2016-03-19 ENCOUNTER — Other Ambulatory Visit: Payer: Self-pay | Admitting: Family Medicine

## 2016-05-04 DIAGNOSIS — Z1231 Encounter for screening mammogram for malignant neoplasm of breast: Secondary | ICD-10-CM | POA: Diagnosis not present

## 2016-05-13 ENCOUNTER — Other Ambulatory Visit: Payer: Self-pay | Admitting: Family Medicine

## 2016-05-18 ENCOUNTER — Ambulatory Visit (INDEPENDENT_AMBULATORY_CARE_PROVIDER_SITE_OTHER): Payer: Medicare Other | Admitting: Family Medicine

## 2016-05-18 ENCOUNTER — Encounter: Payer: Self-pay | Admitting: Family Medicine

## 2016-05-18 VITALS — BP 126/75 | HR 86 | Temp 97.7°F | Ht 66.0 in | Wt 146.2 lb

## 2016-05-18 DIAGNOSIS — R21 Rash and other nonspecific skin eruption: Secondary | ICD-10-CM | POA: Diagnosis not present

## 2016-05-18 DIAGNOSIS — B370 Candidal stomatitis: Secondary | ICD-10-CM

## 2016-05-18 DIAGNOSIS — B079 Viral wart, unspecified: Secondary | ICD-10-CM

## 2016-05-18 MED ORDER — CLOTRIMAZOLE 10 MG MT TROC: OROMUCOSAL | 1 refills | Status: DC

## 2016-05-18 NOTE — Progress Notes (Signed)
   HPI  Patient presents today here with rash, thrush, and discussed the wart, patient is also depressed.  Depression Marks feelings of being "better off dead" on depression screening today, she states that she would not hurt herself and contracts for safety. She states that she's been depressed for "40 years" she reminisces about one of her ex-husband's that committed suicide, she found the body.  Rash Itchy red rash on back and left arm it's been going on for several months, simply worse over the last few weeks. Is helped slightly by triamcinolone.  Thrush States that she gets yeast in her mouth, she wonders if it's from chronic cough drop use as well as denture use. He has been using some old clotrimazole troche's sparingly trying to help the symptoms, they're helping but not completely. She is only used 2 or 3 a day.   PMH: Smoking status noted ROS: Per HPI  Objective: BP 126/75   Pulse 86   Temp 97.7 F (36.5 C) (Oral)   Ht 5\' 6"  (1.676 m)   Wt 146 lb 3.2 oz (66.3 kg)   BMI 23.60 kg/m  Gen: NAD, alert, cooperative with exam HEENT: NCAT, small area of white discoloration on left cheek, no erythema CV: RRR, good S1/S2, no murmur Resp: CTABL, no wheezes, non-labored Ext: No edema, warm Neuro: Alert and oriented, No gross deficits  Skin: R temple with Verruca, approx 6 mm in diameter  L arm and low back with 20-30 erythematous flat lesions measuring 4-6 mm in diameter, no areas of fluctuance, drainage, warmth, or induration.  Psych: Appropriate mood and affect, contracts for safety.  Assessment and plan:  # Thrush Very mild findings on exam, however her symptoms are consistent with thrush Treatment with clotrimazole troche, 5 times daily  # Rash Unusual rash, moderately responsive to steroids Referring 2 dermatology  # Verruca Right temple with a verruca, given area I would recommend dermatology referral.   Depression Passive thoughts of being "better off  dead", denies suicidal intention and contracts for safety Good medication compliance well with Wellbutrin Also has memory loss, see below Continue Wellbutrin for now and follow-up in 3-4 weeks.  After the visit: Amlodipine causing leg swelling Memory loss for several months- plan MMSE at next visit, recommended follow-up 3-4 weeks.    Orders Placed This Encounter  Procedures  . Ambulatory referral to Dermatology    Referral Priority:   Routine    Referral Type:   Consultation    Referral Reason:   Specialty Services Required    Requested Specialty:   Dermatology    Number of Visits Requested:   1    Meds ordered this encounter  Medications  . clotrimazole (MYCELEX) 10 MG troche    Sig: TAKE 1 TABLET 5 TIMES DAILY    Dispense:  50 tablet    Refill:  1    Murtis SinkSam Bradshaw, MD [facility] 05/18/2016, 3:44 PM

## 2016-05-18 NOTE — Patient Instructions (Signed)
Great to see you!  I have sent in the antifungal lozenges, use them 5 times daily for 5 days  You will hear from us about a dermatologist referral

## 2016-06-08 ENCOUNTER — Ambulatory Visit: Payer: Medicare Other | Admitting: Family Medicine

## 2016-06-09 ENCOUNTER — Encounter: Payer: Self-pay | Admitting: Family Medicine

## 2016-06-16 ENCOUNTER — Other Ambulatory Visit: Payer: Self-pay | Admitting: Family Medicine

## 2016-06-17 ENCOUNTER — Ambulatory Visit: Payer: Medicare Other | Admitting: Family Medicine

## 2016-07-13 DIAGNOSIS — R05 Cough: Secondary | ICD-10-CM | POA: Diagnosis not present

## 2016-07-14 ENCOUNTER — Other Ambulatory Visit: Payer: Self-pay | Admitting: Family Medicine

## 2016-08-14 ENCOUNTER — Other Ambulatory Visit: Payer: Self-pay | Admitting: Family Medicine

## 2016-09-09 ENCOUNTER — Encounter: Payer: Self-pay | Admitting: Family

## 2016-09-09 ENCOUNTER — Ambulatory Visit (INDEPENDENT_AMBULATORY_CARE_PROVIDER_SITE_OTHER): Payer: Medicare Other | Admitting: Family

## 2016-09-09 VITALS — BP 149/85 | HR 79 | Temp 97.2°F | Ht 66.0 in | Wt 141.8 lb

## 2016-09-09 DIAGNOSIS — E785 Hyperlipidemia, unspecified: Secondary | ICD-10-CM | POA: Diagnosis not present

## 2016-09-09 DIAGNOSIS — R42 Dizziness and giddiness: Secondary | ICD-10-CM | POA: Diagnosis not present

## 2016-09-09 DIAGNOSIS — B370 Candidal stomatitis: Secondary | ICD-10-CM

## 2016-09-09 DIAGNOSIS — R21 Rash and other nonspecific skin eruption: Secondary | ICD-10-CM | POA: Diagnosis not present

## 2016-09-09 DIAGNOSIS — R5383 Other fatigue: Secondary | ICD-10-CM

## 2016-09-09 MED ORDER — TRIAMCINOLONE ACETONIDE 0.5 % EX OINT: 1.0000 "application " | TOPICAL_OINTMENT | Freq: Two times a day (BID) | CUTANEOUS | 0 refills | Status: DC

## 2016-09-09 MED ORDER — NYSTATIN 100000 UNIT/ML MT SUSP: 5.0000 mL | Freq: Four times a day (QID) | OROMUCOSAL | 0 refills | Status: DC

## 2016-09-09 NOTE — Progress Notes (Signed)
   Subjective:    Patient ID: Pamela Hughes, female    DOB: August 03, 1940, 76 y.o.   MRN: [id number]  Pt presents to the office today with fatigue, weakness, and dizziness that started several weeks. Pt states she started iron and B12 and seems to have helped.  Pt states she feels like she could fall asleep at any time.  Pt complaining of oral thrush. Pt was given Mycelex and states it helped "a little".    PT requesting her cholesterol levels to be rechecked. Pt states he has been told in the past her cholesterol was elevated. Pt does not want to be on statin and has been taking Red Wheat yeast.  Dizziness  Associated symptoms include a rash.  Rash  This is a new problem. The current episode started more than 1 month ago. The problem has been waxing and waning since onset. The affected locations include the back. The rash is characterized by redness and itchiness. She was exposed to nothing. Past treatments include anti-itch cream. The treatment provided mild relief.        Review of Systems  Skin: Positive for rash.  Neurological: Positive for dizziness.  All other systems reviewed and are negative.      Objective:   Physical Exam  Constitutional: She is oriented to person, place, and time. She appears well-developed and well-nourished. No distress.  HENT:  Head: Normocephalic and atraumatic.  Right Ear: External ear normal.  Left Ear: External ear normal.  Nose: Nose normal.  Mouth/Throat: Oropharynx is clear and moist.  No coating on tongue, small erythemas on left cheek   Eyes: Pupils are equal, round, and reactive to light.  Neck: Normal range of motion. Neck supple. No thyromegaly present.  Cardiovascular: Normal rate, regular rhythm, normal heart sounds and intact distal pulses.   No murmur heard. Pulmonary/Chest: Effort normal and breath sounds normal. No respiratory distress. She has no wheezes.  Abdominal: Soft. Bowel sounds are normal. She exhibits no distension.  There is no tenderness.  Musculoskeletal: Normal range of motion. She exhibits no edema or tenderness.  Neurological: She is alert and oriented to person, place, and time.  Skin: Skin is warm and dry. Rash (several scattered erythemas circular  blanchable rash on lower back) noted.  Psychiatric: She has a normal mood and affect. Her behavior is normal. Judgment and thought content normal.  Vitals reviewed.   BP (!) 149/85   Pulse 79   Temp 97.2 F (36.2 C) (Oral)   Ht _0  (1.676 m)   Wt 141 lb 12.8 oz (64.3 kg)   BMI 22.89 kg/m       Assessment & Plan:  1. Other fatigue - Anemia Profile B - CMP14+EGFR - Thyroid Panel With TSH  2. Dizziness - Anemia Profile B - CMP14+EGFR  3. Rash -Pt states she has "been planning" to make appt with Derm, encouraged to make appt - CMP14+EGFR - triamcinolone ointment (KENALOG) 0.5 %; Apply 1 application topically 2 (two) times daily.  Dispense: 30 g; Refill: 0 - Ambulatory referral to Dermatology  4. Oral thrush - CMP14+EGFR - nystatin (MYCOSTATIN) 100000 UNIT/ML suspension; Take 5 mLs (500,000 Units total) by mouth 4 (four) times daily.  Dispense: 60 mL; Refill: 0  5. Hyperlipidemia, unspecified hyperlipidemia type - Lipid panel   Continue all meds Labs pending Health Maintenance reviewed Diet and exercise encouraged RTO as needed and keep follow up with PCP  Evelina Dun, FNP

## 2016-09-09 NOTE — Patient Instructions (Signed)

## 2016-09-10 LAB — LIPID PANEL
CHOL/HDL RATIO: 2.8 ratio (ref 0.0–4.4)
Cholesterol, Total: 268 mg/dL — ABNORMAL HIGH (ref 100–199)
HDL: 97 mg/dL (ref >39)
LDL Calculated: 157 mg/dL — ABNORMAL HIGH (ref 0–99)
Triglycerides: 69 mg/dL (ref 0–149)
VLDL Cholesterol Cal: 14 mg/dL (ref 5–40)

## 2016-09-10 LAB — ANEMIA PROFILE B
BASOS: 1 %
Basophils Absolute: 0 10*3/uL (ref 0.0–0.2)
EOS (ABSOLUTE): 0.2 10*3/uL (ref 0.0–0.4)
EOS: 3 %
FERRITIN: 111 ng/mL (ref 15–150)
Folate: >20 ng/mL (ref >3.0)
HEMATOCRIT: 36.1 % (ref 34.0–46.6)
HEMOGLOBIN: 12.3 g/dL (ref 11.1–15.9)
IMMATURE GRANS (ABS): 0 10*3/uL (ref 0.0–0.1)
IRON SATURATION: 23 % (ref 15–55)
IRON: 64 ug/dL (ref 27–139)
Immature Granulocytes: 0 %
Lymphocytes Absolute: 1.8 10*3/uL (ref 0.7–3.1)
Lymphs: 29 %
MCH: 31.6 pg (ref 26.6–33.0)
MCHC: 34.1 g/dL (ref 31.5–35.7)
MCV: 93 fL (ref 79–97)
MONOCYTES: 8 %
Monocytes Absolute: 0.5 10*3/uL (ref 0.1–0.9)
NEUTROS ABS: 3.7 10*3/uL (ref 1.4–7.0)
Neutrophils: 59 %
Platelets: 405 10*3/uL — ABNORMAL HIGH (ref 150–379)
RBC: 3.89 x10E6/uL (ref 3.77–5.28)
RDW: 13.6 % (ref 12.3–15.4)
RETIC CT PCT: 1.2 % (ref 0.6–2.6)
TIBC: 279 ug/dL (ref 250–450)
UIBC: 215 ug/dL (ref 118–369)
WBC: 6.2 10*3/uL (ref 3.4–10.8)

## 2016-09-10 LAB — CMP14+EGFR
ALT: 14 IU/L (ref 0–32)
AST: 20 IU/L (ref 0–40)
Albumin/Globulin Ratio: 1.7 (ref 1.2–2.2)
Albumin: 4.2 g/dL (ref 3.5–4.8)
Alkaline Phosphatase: 93 IU/L (ref 39–117)
BUN/Creatinine Ratio: 13 (ref 12–28)
BUN: 15 mg/dL (ref 8–27)
Bilirubin Total: 0.2 mg/dL (ref 0.0–1.2)
CO2: 28 mmol/L (ref 18–29)
Calcium: 9.1 mg/dL (ref 8.7–10.3)
Chloride: 95 mmol/L — ABNORMAL LOW (ref 96–106)
Creatinine, Ser: 1.18 mg/dL — ABNORMAL HIGH (ref 0.57–1.00)
GFR calc Af Amer: 52 mL/min/{1.73_m2} — ABNORMAL LOW (ref >59)
GFR calc non Af Amer: 45 mL/min/{1.73_m2} — ABNORMAL LOW (ref >59)
Globulin, Total: 2.5 g/dL (ref 1.5–4.5)
Glucose: 89 mg/dL (ref 65–99)
Potassium: 4.1 mmol/L (ref 3.5–5.2)
Sodium: 137 mmol/L (ref 134–144)
Total Protein: 6.7 g/dL (ref 6.0–8.5)

## 2016-09-10 LAB — THYROID PANEL WITH TSH
FREE THYROXINE INDEX: 2.5 (ref 1.2–4.9)
T3 UPTAKE RATIO: 27 % (ref 24–39)
T4 TOTAL: 9.4 ug/dL (ref 4.5–12.0)
TSH: 1.91 u[IU]/mL (ref 0.450–4.500)

## 2016-09-11 ENCOUNTER — Other Ambulatory Visit: Payer: Self-pay | Admitting: Family

## 2016-09-13 ENCOUNTER — Other Ambulatory Visit: Payer: Self-pay | Admitting: Family Medicine

## 2016-10-01 ENCOUNTER — Telehealth: Payer: Self-pay | Admitting: Family Medicine

## 2016-10-01 MED ORDER — CLOTRIMAZOLE 10 MG MT TROC: 10.0000 mg | Freq: Every day | OROMUCOSAL | 2 refills | Status: DC

## 2016-10-02 ENCOUNTER — Other Ambulatory Visit: Payer: Self-pay | Admitting: Family Medicine

## 2016-10-04 NOTE — Telephone Encounter (Signed)
Patient aware that rx sent to pharmacy. 

## 2016-10-12 ENCOUNTER — Ambulatory Visit (INDEPENDENT_AMBULATORY_CARE_PROVIDER_SITE_OTHER): Payer: Medicare Other | Admitting: Family

## 2016-10-12 ENCOUNTER — Encounter: Payer: Self-pay | Admitting: Family

## 2016-10-12 VITALS — BP 149/82 | HR 76 | Temp 97.6°F | Ht 66.0 in | Wt 141.2 lb

## 2016-10-12 DIAGNOSIS — M5430 Sciatica, unspecified side: Secondary | ICD-10-CM | POA: Diagnosis not present

## 2016-10-12 MED ORDER — METHYLPREDNISOLONE ACETATE 80 MG/ML IJ SUSP
80.0000 mg | Freq: Once | INTRAMUSCULAR | Status: AC
  Administered 2016-10-12: 80 mg via INTRAMUSCULAR

## 2016-10-12 MED ORDER — KETOROLAC TROMETHAMINE 60 MG/2ML IM SOLN
30.0000 mg | Freq: Once | INTRAMUSCULAR | Status: AC
  Administered 2016-10-12: 30 mg via INTRAMUSCULAR

## 2016-10-12 NOTE — Patient Instructions (Addendum)
Spondylolisthesis Rehab Ask your health care provider which exercises are safe for you. Do exercises exactly as told by your health care provider and adjust them as directed. It is normal to feel mild stretching, pulling, tightness, or discomfort as you do these exercises, but you should stop right away if you feel sudden pain or your pain gets worse. Do not begin these exercises until told by your health care provider. Stretching and range of motion exercises These exercises warm up your muscles and joints and improve the movement and flexibility of your hips and your back. These exercises may also help to relieve pain, numbness, and tingling. Exercise A: Single knee to chest   1. Lie on your back on a firm surface with both legs straight. 2. Bend one of your knees. Use your hands to move your knee up toward your chest until you feel a gentle stretch in your lower back and buttock.  Hold your leg in this position by holding onto the front of your knee.  Keep your other leg as straight as possible. 3. Hold for __________ seconds. 4. Slowly return to the starting position. 5. Repeat this exercise with your other leg. Repeat __________ times. Complete this exercise __________ times a day. Exercise B: Double knee to chest   1. Lie on your back on a firm surface with both legs straight. 2. Bend one of your knees and move it toward your chest until you feel a gentle stretch in your lower back and buttock. 3. Tense your abdominal muscles and repeat the previous step with your other leg. 4. Hold both of your legs in this position by holding onto the backs of your thighs or the fronts of your knees. 5. Hold for __________ seconds. 6. Tense your abdominal muscles and slowly move your legs back to the floor, one leg at a time. Repeat __________ times. Complete this exercise __________ times a day. Strengthening exercises These exercises build strength and endurance in your back. Endurance is the  ability to use your muscles for a long time, even after they get tired. Exercise C: Pelvic tilt  1. Lie on your back on a firm bed or the floor. Bend your knees and keep your feet flat. 2. Tense your abdominal muscles. Tip your pelvis up toward the ceiling and flatten your lower back into the floor.  To help with this exercise, you may place a small towel under your lower back and try to push your back into the towel. 3. Hold for __________ seconds. 4. Let your muscles relax completely before you repeat this exercise. Repeat __________ times. Complete this exercise __________ times a day. Exercise D: Abdominal crunch   1. Lie on your back on a firm surface. Bend your knees and keep your feet flat. Cross your arms over your chest. 2. Tuck your chin down toward your chest, without bending your neck. 3. Use your abdominal muscles to lift your upper body off of the ground, straight up into the air.  Try to lift yourself until your shoulder blades are off the ground. You may need to work up to this.  Keep your lower back on the ground while you crunch upward.  Do not hold your breath. 4. Slowly lower yourself down. Keep your abdominal muscles tense until you are back to the starting position. Repeat __________ times. Complete this exercise __________ times a day. Exercise E: Alternating arm and leg raises   1. Get on your hands and knees on a firm  surface. If you are on a hard floor, you may want to use padding to cushion your knees, such as an exercise mat. 2. Line up your arms and legs. Your hands should be below your shoulders, and your knees should be below your hips. 3. Lift your left leg behind you. At the same time, raise your right arm and straighten it in front of you.  Do not lift your leg higher than your hip.  Do not lift your arm higher than your shoulder.  Keep your abdominal and back muscles tight.  Keep your hips facing the ground.  Do not arch your back.  Keep your  balance carefully, and do not hold your breath. 4. Hold for __________ seconds. 5. Slowly return to the starting position and repeat with your right leg and your left arm. Repeat __________ times. Complete this exercise __________ times a day. Posture and body mechanics   Body mechanics refers to the movements and positions of your body while you do your daily activities. Posture is part of body mechanics. Good posture and healthy body mechanics can help to relieve stress in your body's tissues and joints. Good posture means that your spine is in its natural S-curve position (your spine is neutral), your shoulders are pulled back slightly, and your head is not tipped forward. The following are general guidelines for applying improved posture and body mechanics to your everyday activities. Standing    When standing, keep your spine neutral and your feet about hip-width apart. Keep a slight bend in your knees. Your ears, shoulders, and hips should line up.  When you do a task in which you stand in one place for a long time, place one foot up on a stable object that is 2-4 inches (5-10 cm) high, such as a footstool. This helps keep your spine neutral. Sitting    When sitting, keep your spine neutral and keep your feet flat on the floor. Use a footrest, if necessary, and keep your thighs parallel to the floor. Avoid rounding your shoulders, and avoid tilting your head forward.  When working at a desk or a computer, keep your desk at a height where your hands are slightly lower than your elbows. Slide your chair under your desk so you are close enough to maintain good posture.  When working at a computer, place your monitor at a height where you are looking straight ahead and you do not have to tilt your head forward or downward to look at the screen. Resting   When lying down and resting, avoid positions that are most painful for you.  If you have pain with activities such as sitting, bending,  stooping, or squatting (flexion-based activities), lie in a position in which your body does not bend very much. For example, avoid curling up on your side with your arms and knees near your chest (fetal position).  If you have pain with activities such as standing for a long time or reaching with your arms (extension-based activities), lie with your spine in a neutral position and bend your knees slightly. Try the following positions:  Lying on your side with a pillow between your knees.  Lying on your back with a pillow under your knees. Lifting    When lifting objects, keep your feet at least shoulder-width apart and tighten your abdominal muscles.  Bend your knees and hips and keep your spine neutral. It is important to lift using the strength of your legs, not your back. Do  not lock your knees straight out.  Always ask for help to lift heavy or awkward objects. This information is not intended to replace advice given to you by your health care provider. Make sure you discuss any questions you have with your health care provider. Document Released: 05/17/2005 Document Revised: 01/22/2016 Document Reviewed: 02/25/2015 Elsevier Interactive Patient Education  2017 [employer] Sciatica Sciatica is pain, numbness, weakness, or tingling along your sciatic nerve. The sciatic nerve starts in the lower back and goes down the back of each leg. Sciatica happens when this nerve is pinched or has pressure put on it. Sciatica usually goes away on its own or with treatment. Sometimes, sciatica may keep coming back (recur). Follow these instructions at home: Medicines   Take over-the-counter and prescription medicines only as told by your doctor.  Do not drive or use heavy machinery while taking prescription pain medicine. Managing pain   If directed, put ice on the affected area.  Put ice in a plastic bag.  Place a towel between your skin and the bag.  Leave the ice on for 20 minutes, 2-3  times a day.  After icing, apply heat to the affected area before you exercise or as often as told by your doctor. Use the heat source that your doctor tells you to use, such as a moist heat pack or a heating pad.  Place a towel between your skin and the heat source.  Leave the heat on for 20-30 minutes.  Remove the heat if your skin turns bright red. This is especially important if you are unable to feel pain, heat, or cold. You may have a greater risk of getting burned. Activity   Return to your normal activities as told by your doctor. Ask your doctor what activities are safe for you.  Avoid activities that make your sciatica worse.  Take short rests during the day. Rest in a lying or standing position. This is usually better than sitting to rest.  When you rest for a long time, do some physical activity or stretching between periods of rest.  Avoid sitting for a long time without moving. Get up and move around at least one time each hour.  Exercise and stretch regularly, as told by your doctor.  Do not lift anything that is heavier than 10 lb (4.5 kg) while you have symptoms of sciatica.  Avoid lifting heavy things even when you do not have symptoms.  Avoid lifting heavy things over and over.  When you lift objects, always lift in a way that is safe for your body. To do this, you should:  Bend your knees.  Keep the object close to your body.  Avoid twisting. General instructions   Use good posture.  Avoid leaning forward when you are sitting.  Avoid hunching over when you are standing.  Stay at a healthy weight.  Wear comfortable shoes that support your feet. Avoid wearing high heels.  Avoid sleeping on a mattress that is too soft or too hard. You might have less pain if you sleep on a mattress that is firm enough to support your back.  Keep all follow-up visits as told by your doctor. This is important. Contact a doctor if:  You have pain that:  Wakes you  up when you are sleeping.  Gets worse when you lie down.  Is worse than the pain you have had in the past.  Lasts longer than 4 weeks.  You lose weight for without trying. Get  help right away if:  You cannot control when you pee (urinate) or poop (have a bowel movement).  You have weakness in any of these areas and it gets worse.  Lower back.  Lower belly (pelvis).  Butt (buttocks).  Legs.  You have redness or swelling of your back.  You have a burning feeling when you pee. This information is not intended to replace advice given to you by your health care provider. Make sure you discuss any questions you have with your health care provider. Document Released: 02/24/2008 Document Revised: 10/23/2015 Document Reviewed: 01/24/2015 Elsevier Interactive Patient Education  2017 [employer]

## 2016-10-12 NOTE — Progress Notes (Addendum)
   Subjective:    Patient ID: Pamela Hughes, female    DOB: [birth year], 76 y.o.   MRN: [id number]  Back Pain  This is a recurrent problem. The current episode started 1 to 4 weeks ago. The problem occurs constantly. The problem is unchanged. The pain is present in the gluteal. The quality of the pain is described as aching. The pain radiates to the left thigh. The pain is at a severity of 5/10. The pain is moderate. The symptoms are aggravated by standing. Associated symptoms include leg pain. Pertinent negatives include no bladder incontinence or bowel incontinence. Risk factors include menopause. She has tried bed rest and NSAIDs for the symptoms. The treatment provided mild relief.      Review of Systems  Gastrointestinal: Negative for bowel incontinence.  Genitourinary: Negative for bladder incontinence.  Musculoskeletal: Positive for back pain.  All other systems reviewed and are negative.      Objective:   Physical Exam  Constitutional: She is oriented to person, place, and time. She appears well-developed and well-nourished. No distress.  HENT:  Head: Normocephalic.  Eyes: Pupils are equal, round, and reactive to light.  Neck: Normal range of motion. Neck supple. No thyromegaly present.  Cardiovascular: Normal rate, regular rhythm and intact distal pulses.   Murmur heard. Pulmonary/Chest: Effort normal and breath sounds normal. No respiratory distress. She has no wheezes.  Abdominal: Soft. Bowel sounds are normal. She exhibits no distension. There is no tenderness.  Musculoskeletal: Normal range of motion. She exhibits no edema or tenderness.  Negative SLR, Full ROM, tenderness in left gluteal when standing   Neurological: She is alert and oriented to person, place, and time.  Skin: Skin is warm and dry.  Psychiatric: She has a normal mood and affect. Her behavior is normal. Judgment and thought content normal.  Vitals reviewed.     BP (!) 149/82   Pulse 76   Temp 97.6  F (36.4 C) (Oral)   Ht 5\' 6"  (1.676 m)   Wt 141 lb 3.2 oz (64 kg)   BMI 22.79 kg/m      Assessment & Plan:  1. Sciatic leg pain -Rest -Ice and heat -ROM exercises discussed, handout given RTO prn  - methylPREDNISolone acetate (DEPO-MEDROL) injection 80 mg; Inject 1 mL (80 mg total) into the muscle once. - ketorolac (TORADOL) injection 30 mg; Inject 1 mL (30 mg total) into the muscle once.   Jannifer Rodneyhristy Knight [place], FNP

## 2016-11-01 ENCOUNTER — Telehealth: Payer: Self-pay

## 2016-11-01 DIAGNOSIS — R197 Diarrhea, unspecified: Secondary | ICD-10-CM

## 2016-11-01 NOTE — Telephone Encounter (Signed)
Referral ordered

## 2016-11-01 NOTE — Telephone Encounter (Signed)
Pt  Wants a referral to Dr. Karilyn Cotaehman for diarrhea

## 2016-11-05 ENCOUNTER — Other Ambulatory Visit: Payer: Self-pay | Admitting: Physician Assistant

## 2016-11-05 ENCOUNTER — Other Ambulatory Visit: Payer: Self-pay | Admitting: Family Medicine

## 2016-11-11 ENCOUNTER — Telehealth (INDEPENDENT_AMBULATORY_CARE_PROVIDER_SITE_OTHER): Payer: Self-pay | Admitting: *Deleted

## 2016-11-11 ENCOUNTER — Other Ambulatory Visit (INDEPENDENT_AMBULATORY_CARE_PROVIDER_SITE_OTHER): Payer: Self-pay | Admitting: Internal Medicine

## 2016-11-11 ENCOUNTER — Ambulatory Visit (INDEPENDENT_AMBULATORY_CARE_PROVIDER_SITE_OTHER): Payer: Medicare Other | Admitting: Internal Medicine

## 2016-11-11 ENCOUNTER — Encounter (INDEPENDENT_AMBULATORY_CARE_PROVIDER_SITE_OTHER): Payer: Self-pay

## 2016-11-11 ENCOUNTER — Encounter (INDEPENDENT_AMBULATORY_CARE_PROVIDER_SITE_OTHER): Payer: Self-pay | Admitting: *Deleted

## 2016-11-11 ENCOUNTER — Encounter (INDEPENDENT_AMBULATORY_CARE_PROVIDER_SITE_OTHER): Payer: Self-pay | Admitting: Internal Medicine

## 2016-11-11 VITALS — BP 140/78 | HR 70 | Temp 97.7°F | Resp 18 | Ht 65.5 in | Wt 138.0 lb

## 2016-11-11 DIAGNOSIS — Z800 Family history of malignant neoplasm of digestive organs: Secondary | ICD-10-CM

## 2016-11-11 DIAGNOSIS — R197 Diarrhea, unspecified: Secondary | ICD-10-CM | POA: Diagnosis not present

## 2016-11-11 MED ORDER — PEG 3350-KCL-NA BICARB-NACL 420 G PO SOLR: 4000.0000 mL | Freq: Once | ORAL | 0 refills | Status: AC

## 2016-11-11 NOTE — Patient Instructions (Addendum)
The risks of bleeding, perforation and infection were reviewed with patient. GI pathogen.  Fiber 4gm po daily.

## 2016-11-11 NOTE — Progress Notes (Signed)
   Subjective:    Patient ID: Pamela Hughes, female    DOB: 02/28/1941, 76 y.o.   MRN: [id number]  HPI  Referred by Jannifer Rodneyhristy Hawks FNP for diarrhea. She says about 1 1/2 yrs ago,diarrhea started. She says she usually has constipation. She also states she has a lot of gas.  She takes Imodium which helps with the diarrhea.  She says it controls the incontinence. She says the diarrhea is in the morning. She is having about 2 stools a day with the Imodium. No recent antibiotics. She says her stools are always watery. No form.  Really no weight. Appetite is good.  She occasionally sees blood ( she says she has a hemorrhoid).  She says the gas pain is awful.  Her last colonoscopy was 6-7 yrs ago.  09/09/2016 TSH 1.910.   09/2010 Colonoscopy: family hx of colon cancer (Mother's sister). Normal colonoscopy.  Review of Systems Past Medical History:  Diagnosis Date  . Allergy   . Alopecia   . Arthritis   . Depression   . Edema   . Hypertension     Past Surgical History:  Procedure Laterality Date  . ABDOMINAL HYSTERECTOMY     one ovary left  . SPINE SURGERY  1990s   L spine    No Known Allergies  Current Outpatient Prescriptions on File Prior to Visit  Medication Sig Dispense Refill  . amLODipine (NORVASC) 5 MG tablet TAKE ONE (1) TABLET EACH DAY 90 tablet 0  . buPROPion (WELLBUTRIN SR) 150 MG 12 hr tablet TAKE ONE TABLET BY MOUTH TWICE DAILY 180 tablet 0  . cholecalciferol (VITAMIN D) 1000 UNITS tablet Take 1,000 Units by mouth daily.    . clotrimazole (MYCELEX) 10 MG troche Take 1 tablet (10 mg total) by mouth 5 (five) times daily. 40 tablet 2  . furosemide (LASIX) 20 MG tablet TAKE ONE TABLET BY MOUTH TWICE DAILY 60 tablet 3  . ipratropium (ATROVENT) 0.03 % nasal spray USE TWICE DAILY AS DIRECTED 30 mL 1  . meloxicam (MOBIC) 7.5 MG tablet TAKE ONE (1) TABLET EACH DAY 90 tablet 0  . Multiple Vitamin (MULTIVITAMIN WITH MINERALS) TABS tablet Take 1 tablet by mouth daily.    Marland Kitchen.  omeprazole (PRILOSEC) 20 MG capsule TAKE ONE (1) CAPSULE EACH DAY 90 capsule 0  . TRAVEL SICKNESS 25 MG CHEW TAKE ONE TABLET 3 TIMES A DAY AS NEEDED. 30 each 3  . traZODone (DESYREL) 150 MG tablet TAKE ONE TABLET DAILY AT BEDTIME 90 tablet 1   No current facility-administered medications on file prior to visit.         Objective:   Physical Exam Blood pressure 140/78, pulse 70, temperature 97.7 F (36.5 C), temperature source Oral, resp. rate 18, height 5' 5.5" (1.664 m), weight 138 lb (62.6 kg).  Alert and oriented. Skin warm and dry. Oral mucosa is moist.   . Sclera anicteric, conjunctivae is pink. Thyroid not enlarged. No cervical lymphadenopathy. Lungs clear. Heart regular rate and rhythm.  Abdomen is soft. Bowel sounds are positive. No hepatomegaly. No abdominal masses felt. No tenderness.  No edema to lower extremities.  Stool brown and guaiac negative.         Assessment & Plan:  Diarrhea.: Fiber 4gm po. ' GI pathogen. Colonoscopy: Family hx of colon cancer.

## 2016-11-11 NOTE — Telephone Encounter (Signed)
Patient needs trilyte 

## 2016-11-16 DIAGNOSIS — R197 Diarrhea, unspecified: Secondary | ICD-10-CM | POA: Diagnosis not present

## 2016-11-18 DIAGNOSIS — H00015 Hordeolum externum left lower eyelid: Secondary | ICD-10-CM | POA: Diagnosis not present

## 2016-11-18 LAB — GASTROINTESTINAL PATHOGEN PANEL PCR
C. difficile Tox A/B, PCR: NOT DETECTED
CAMPYLOBACTER, PCR: NOT DETECTED
Cryptosporidium, PCR: NOT DETECTED
E COLI (ETEC) LT/ST, PCR: NOT DETECTED
E COLI 0157, PCR: NOT DETECTED
E coli (STEC) stx1/stx2, PCR: NOT DETECTED
GIARDIA LAMBLIA, PCR: NOT DETECTED
Norovirus, PCR: NOT DETECTED
Rotavirus A, PCR: NOT DETECTED
Salmonella, PCR: NOT DETECTED
Shigella, PCR: NOT DETECTED

## 2016-11-26 ENCOUNTER — Telehealth (INDEPENDENT_AMBULATORY_CARE_PROVIDER_SITE_OTHER): Payer: Self-pay | Admitting: Internal Medicine

## 2016-11-26 NOTE — Telephone Encounter (Signed)
Results given to patient. GI pathogen was normal.

## 2016-11-26 NOTE — Telephone Encounter (Signed)
Patient is calling regarding stool card results.  [phone]

## 2016-12-12 ENCOUNTER — Other Ambulatory Visit: Payer: Self-pay | Admitting: Family Medicine

## 2016-12-13 ENCOUNTER — Other Ambulatory Visit: Payer: Self-pay | Admitting: Family Medicine

## 2016-12-17 ENCOUNTER — Encounter (HOSPITAL_COMMUNITY)
Admission: RE | Disposition: A | Discharge status: Home / Self Care | Payer: Self-pay | Source: Ambulatory Visit | Attending: Internal Medicine

## 2016-12-17 ENCOUNTER — Ambulatory Visit (HOSPITAL_COMMUNITY)
Admission: RE | Admit: 2016-12-17 | Discharge: 2016-12-17 | Disposition: A | Discharge status: Home / Self Care | Payer: Medicare Other | Source: Ambulatory Visit | Attending: Internal Medicine | Admitting: Internal Medicine

## 2016-12-17 ENCOUNTER — Encounter (HOSPITAL_COMMUNITY): Payer: Self-pay | Admitting: *Deleted

## 2016-12-17 DIAGNOSIS — K529 Noninfective gastroenteritis and colitis, unspecified: Secondary | ICD-10-CM | POA: Diagnosis not present

## 2016-12-17 DIAGNOSIS — Z800 Family history of malignant neoplasm of digestive organs: Secondary | ICD-10-CM | POA: Diagnosis not present

## 2016-12-17 DIAGNOSIS — K52839 Microscopic colitis, unspecified: Secondary | ICD-10-CM | POA: Insufficient documentation

## 2016-12-17 DIAGNOSIS — F329 Major depressive disorder, single episode, unspecified: Secondary | ICD-10-CM | POA: Insufficient documentation

## 2016-12-17 DIAGNOSIS — K644 Residual hemorrhoidal skin tags: Secondary | ICD-10-CM | POA: Diagnosis not present

## 2016-12-17 DIAGNOSIS — Z87891 Personal history of nicotine dependence: Secondary | ICD-10-CM | POA: Diagnosis not present

## 2016-12-17 DIAGNOSIS — I10 Essential (primary) hypertension: Secondary | ICD-10-CM | POA: Insufficient documentation

## 2016-12-17 DIAGNOSIS — K6389 Other specified diseases of intestine: Secondary | ICD-10-CM | POA: Diagnosis not present

## 2016-12-17 DIAGNOSIS — Z79899 Other long term (current) drug therapy: Secondary | ICD-10-CM | POA: Diagnosis not present

## 2016-12-17 DIAGNOSIS — K6289 Other specified diseases of anus and rectum: Secondary | ICD-10-CM | POA: Diagnosis not present

## 2016-12-17 HISTORY — PX: COLONOSCOPY: SHX5424

## 2016-12-17 HISTORY — DX: Diarrhea, unspecified: R19.7

## 2016-12-17 SURGERY — COLONOSCOPY
Anesthesia: Moderate Sedation

## 2016-12-17 MED ORDER — SODIUM CHLORIDE 0.9 % IV SOLN
INTRAVENOUS | Status: DC
  Administered 2016-12-17: 14:00:00 via INTRAVENOUS

## 2016-12-17 MED ORDER — MIDAZOLAM HCL 5 MG/5ML IJ SOLN
INTRAMUSCULAR | Status: DC | PRN
  Administered 2016-12-17: 2 mg via INTRAVENOUS
  Administered 2016-12-17: 1 mg via INTRAVENOUS
  Administered 2016-12-17: 2 mg via INTRAVENOUS

## 2016-12-17 MED ORDER — MIDAZOLAM HCL 5 MG/5ML IJ SOLN
INTRAMUSCULAR | Status: AC
  Filled 2016-12-17: qty 10

## 2016-12-17 MED ORDER — SIMETHICONE 40 MG/0.6ML PO SUSP
ORAL | Status: DC | PRN
  Administered 2016-12-17: 14:00:00

## 2016-12-17 MED ORDER — LOPERAMIDE HCL 2 MG PO CAPS: 2.0000 mg | ORAL_CAPSULE | Freq: Every day | ORAL | Status: DC

## 2016-12-17 MED ORDER — MEPERIDINE HCL 50 MG/ML IJ SOLN
INTRAMUSCULAR | Status: AC
  Filled 2016-12-17: qty 1

## 2016-12-17 MED ORDER — MEPERIDINE HCL 50 MG/ML IJ SOLN
INTRAMUSCULAR | Status: DC | PRN
  Administered 2016-12-17 (×2): 25 mg via INTRAVENOUS

## 2016-12-17 NOTE — H&P (Signed)
Pamela Hughes is an 76 y.o. female.   Chief Complaint: Patient is here for colonoscopy. HPI: She is [age] old Caucasian female who presents with over a year history of nonbloody diarrhea. If she takes Imodium she has 2-3 stools stools every morning. If she does not she has multiple stools throughout the day. She also has some cramping. She says she used to be constipated and one year ago things change. She has lost few pounds but nothing significant. Her appetite is normal. Last colonoscopy was in May 2012 was normal. Family history is negative for CRC in first-degree relative but her maternal aunt had colon cancer in her 1870s.  Past Medical History:  Diagnosis Date  . Allergy   . Alopecia   . Arthritis   . Depression   . Diarrhea    chronic- "states for at least a year)  . Edema   . Hypertension     Past Surgical History:  Procedure Laterality Date  . ABDOMINAL HYSTERECTOMY     one ovary left  . SPINE SURGERY  1990s   L spine    Family History  Problem Relation Age of Onset  . Alzheimer's disease Mother   . Alcohol abuse Father   . Diabetes Brother    Social History:  reports that she quit smoking about 42 years ago. She has never used smokeless tobacco. She reports that she drinks alcohol. She reports that she does not use drugs.  Allergies: No Known Allergies  Medications Prior to Admission  Medication Sig Dispense Refill  . amLODipine (NORVASC) 5 MG tablet TAKE ONE (1) TABLET EACH DAY 90 tablet 0  . beta carotene w/minerals (OCUVITE) tablet Take 1 tablet by mouth daily.    Marland Kitchen. buPROPion (WELLBUTRIN SR) 150 MG 12 hr tablet TAKE ONE TABLET BY MOUTH TWICE DAILY (Patient taking differently: TAKE ONE TABLET BY MOUTH ONCE DAILY IN THE MORNING.) 180 tablet 0  . Calcium Carbonate-Vitamin D (CALTRATE 600+D PO) Take 1 tablet by mouth daily.    . Carboxymethylcellulose Sodium (THERATEARS) 0.25 % SOLN Place 1 drop into both eyes daily.    . cholecalciferol (VITAMIN D) 1000 UNITS tablet  Take 1,000 Units by mouth daily. 25 mcg    . clotrimazole (MYCELEX) 10 MG troche DISSOLVE 1 TABLET IN MOUTH 5 TIMES DAILY (Patient taking differently: DISSOLVE 1 TABLET IN MOUTH UP TO 5 TIMES DAILY IF NEEDED FOR MOUTH PAIN.) 35 lozenge 2  . furosemide (LASIX) 20 MG tablet TAKE ONE TABLET BY MOUTH TWICE DAILY (Patient taking differently: TAKE ONE TABLET BY MOUTH ONCE DAILY IN THE MORNING.) 180 tablet 0  . Glucosamine-Chondroitin (MOVE FREE PO) Take 2 tablets by mouth daily.    Marland Kitchen. ipratropium (ATROVENT) 0.03 % nasal spray USE TWICE DAILY AS DIRECTED (Patient taking differently: USE ONCE DAILY IN THE MORNING.) 30 mL 1  . levocetirizine (XYZAL) 5 MG tablet Take 5 mg by mouth daily as needed for allergies.    Marland Kitchen. loperamide (IMODIUM A-D) 2 MG tablet Take 1 mg by mouth daily.    . meloxicam (MOBIC) 7.5 MG tablet TAKE ONE (1) TABLET EACH DAY (Patient taking differently: TAKE ONE (1) TABLET DAILY AS NEEDED FOR BACK PAIN.) 90 tablet 0  . Multiple Vitamin (MULTIVITAMIN WITH MINERALS) TABS tablet Take 1 tablet by mouth daily. Centrum Silver    . omeprazole (PRILOSEC) 20 MG capsule TAKE ONE (1) CAPSULE EACH DAY (Patient taking differently: TAKE ONE (1) CAPSULE DAILY IN THE MORNING.) 90 capsule 0  . polycarbophil (FIBERCON)  625 MG tablet Take 1,875 mg by mouth daily.    . Probiotic Product (PROBIOTIC PO) Take 1 capsule by mouth daily.    . TRAVEL SICKNESS 25 MG CHEW TAKE ONE TABLET 3 TIMES A DAY AS NEEDED. (Patient taking differently: TAKE ONE TABLET 3 TIMES A DAY AS NEEDED FOR DIZZINESS.) 30 each 3  . traZODone (DESYREL) 150 MG tablet TAKE ONE TABLET DAILY AT BEDTIME 90 tablet 1    No results found for this or any previous visit (from the past 48 hour(s)). No results found.  ROS  Blood pressure (!) 165/72, pulse 70, temperature 98.4 F (36.9 C), temperature source Oral, resp. rate 12, height 5' 5.5" (1.664 m), weight 135 lb (61.2 kg), SpO2 100 %. Physical Exam  Constitutional: She is oriented to person,  place, and time. She appears well-developed and well-nourished.  HENT:  Mouth/Throat: Oropharynx is clear and moist.  Eyes: Conjunctivae are normal.  Neck: No thyromegaly present.  Cardiovascular: Normal rate and regular rhythm.   Murmur (faint systolic ejection murmur best heard at left sternalder.) heard. Respiratory: Effort normal and breath sounds normal.  GI: Soft. She exhibits no distension and no mass. There is no tenderness.  Musculoskeletal: She exhibits no edema.  Lymphadenopathy:    She has no cervical adenopathy.  Neurological: She is alert and oriented to person, place, and time.  Skin: Skin is warm and dry.     Assessment/Plan Chronic diarrhea. Diagnostic colonoscopy.  Lionel December, MD 12/17/2016, 2:23 PM

## 2016-12-17 NOTE — Op Note (Signed)
[facility] Patient Name: Pamela Hughes Procedure Date: 12/17/2016 1:58 PM MRN: [id number] Date of Birth: 07-18-1940 Attending MD: Lionel December , MD CSN: [id number] Age: 76 Admit Type: Outpatient Procedure:                Colonoscopy Indications:              Chronic diarrhea Providers:                Lionel December, MD, Criselda Peaches. Patsy Lager, RN, Dyann Ruddle Referring MD:             Edilia Bo Hawks, FNP Medicines:                Meperidine 50 mg IV, Midazolam 5 mg IV Complications:            No immediate complications. Estimated Blood Loss:     Estimated blood loss was minimal. Procedure:                Pre-Anesthesia Assessment:                           - Prior to the procedure, a History and Physical                            was performed, and patient medications and                            allergies were reviewed. The patient's tolerance of                            previous anesthesia was also reviewed. The risks                            and benefits of the procedure and the sedation                            options and risks were discussed with the patient.                            All questions were answered, and informed consent                            was obtained. Prior Anticoagulants: The patient                            last took previous NSAID medication on the day of                            the procedure. ASA Grade Assessment: II - A patient                            with mild systemic disease. After reviewing the  risks and benefits, the patient was deemed in                            satisfactory condition to undergo the procedure.                           After obtaining informed consent, the colonoscope                            was passed under direct vision. Throughout the                            procedure, the patient's blood pressure, pulse, and                            oxygen  saturations were monitored continuously. The                            [id number] ([id number]) scope was introduced through                            the anus and advanced to the the cecum, identified                            by appendiceal orifice and ileocecal valve. The                            colonoscopy was somewhat difficult due to a                            tortuous colon. Successful completion of the                            procedure was aided by withdrawing and reinserting                            the scope. The ileocecal valve, appendiceal                            orifice, and rectum were photographed. The quality                            of the bowel preparation was good. Scope In: 2:35:32 PM Scope Out: 2:57:30 PM Scope Withdrawal Time: 0 hours 8 minutes 3 seconds  Total Procedure Duration: 0 hours 21 minutes 58 seconds  Findings:      The perianal and digital rectal examinations were normal.      The colon (entire examined portion) appeared normal. Biopsies were taken       with a cold forceps for histology.      External hemorrhoids were found during retroflexion. The hemorrhoids       were small.      Anal papilla(e) were hypertrophied. Impression:               - The entire examined colon is normal.  Random                            Biopsies taken from mucosa of sigmoid colon.                           - External hemorrhoids.                           - Anal papilla. Moderate Sedation:      Moderate (conscious) sedation was administered by the endoscopy nurse       and supervised by the endoscopist. The following parameters were       monitored: oxygen saturation, heart rate, blood pressure, CO2       capnography and response to care. Total physician intraservice time was       28 minutes. Recommendation:           - Patient has a contact number available for                            emergencies. The signs and symptoms of potential                             delayed complications were discussed with the                            patient. Return to normal activities tomorrow.                            Written discharge instructions were provided to the                            patient.                           - Resume previous diet today.                           - Continue present medications.                           - Await pathology results.                           - Patient advised to take loperamide OTC 2 mg every                            morning rather than on as-needed basis.                           Comment:atient's chronarrhea is possibly do to IBS                            unless biopsy reveals microscopic or collagenous                            colitis.                           -  No repeat colonoscopy due to the absence of                            advanced adenomas. Procedure Code(s):        --- Professional ---                           [phone], Colonoscopy, flexible; with biopsy, single                            or multiple                           99152, Moderate sedation services provided by the                            same physician or other qualified health care                            professional performing the diagnostic or                            therapeutic service that the sedation supports,                            requiring the presence of an independent trained                            observer to assist in the monitoring of the                            patient's level of consciousness and physiological                            status; initial 15 minutes of intraservice time,                            patient age 56 years or older                           [phone], Moderate sedation services; each additional                            15 minutes intraservice time Diagnosis Code(s):        --- Professional ---                           K64.4, Residual hemorrhoidal skin tags                            K62.89, Other specified diseases of anus and rectum                           K52.9, Noninfective gastroenteritis and colitis,  unspecified CPT copyright 2016 American Medical Association. All rights reserved. The codes documented in this report are preliminary and upon coder review may  be revised to meet current compliance requirements. Lionel December, MD Lionel December, MD 12/17/2016 3:24:21 PM This report has been signed electronically. Number of Addenda: 0

## 2016-12-17 NOTE — Discharge Instructions (Signed)
Resume usual diet and medications. Take Imodium OTC 2 mg by mouth daily before breakfast. No driving for 24 hours. Physician will call with biopsy results and further recommendations.   Colonoscopy, Adult, Care After This sheet gives you information about how to care for yourself after your procedure. Your health care provider may also give you more specific instructions. If you have problems or questions, contact your health care provider. What can I expect after the procedure? After the procedure, it is common to have:  A small amount of blood in your stool for 24 hours after the procedure.  Some gas.  Mild abdominal cramping or bloating.  Follow these instructions at home: General instructions   For the first 24 hours after the procedure: ? Do not drive or use machinery. ? Do not sign important documents. ? Do not drink alcohol. ? Do your regular daily activities at a slower pace than normal. ? Eat soft, easy-to-digest foods. ? Rest often.  Take over-the-counter or prescription medicines only as told by your health care provider.  It is up to you to get the results of your procedure. Ask your health care provider, or the department performing the procedure, when your results will be ready. Relieving cramping and bloating  Try walking around when you have cramps or feel bloated.  Apply heat to your abdomen as told by your health care provider. Use a heat source that your health care provider recommends, such as a moist heat pack or a heating pad. ? Place a towel between your skin and the heat source. ? Leave the heat on for 20-30 minutes. ? Remove the heat if your skin turns bright red. This is especially important if you are unable to feel pain, heat, or cold. You may have a greater risk of getting burned. Eating and drinking  Drink enough fluid to keep your urine clear or pale yellow.  Resume your normal diet as instructed by your health care provider. Avoid heavy or  fried foods that are hard to digest.  Avoid drinking alcohol for as long as instructed by your health care provider. Contact a health care provider if:  You have blood in your stool 2-3 days after the procedure. Get help right away if:  You have more than a small spotting of blood in your stool.  You pass large blood clots in your stool.  Your abdomen is swollen.  You have nausea or vomiting.  You have a fever.  You have increasing abdominal pain that is not relieved with medicine. This information is not intended to replace advice given to you by your health care provider. Make sure you discuss any questions you have with your health care provider. Document Released: 12/30/2003 Document Revised: 02/09/2016 Document Reviewed: 07/29/2015 Elsevier Interactive Patient Education  [employer]2018 [employer]

## 2016-12-20 ENCOUNTER — Telehealth (INDEPENDENT_AMBULATORY_CARE_PROVIDER_SITE_OTHER): Payer: Self-pay | Admitting: *Deleted

## 2016-12-20 NOTE — Telephone Encounter (Signed)
had tcs Friday, has been taking Imodium for diarrhea -- states paper work said she need to take something else, wants to know if it as been called in  Ph# [phone]

## 2016-12-20 NOTE — Telephone Encounter (Signed)
Patient was called and a message was left. Per Dr.Rehman the patient is to take 1 in the morning and the second Imodium on a as needed basis. She needs to be on a schedule.

## 2016-12-21 ENCOUNTER — Encounter (HOSPITAL_COMMUNITY): Payer: Self-pay | Admitting: Internal Medicine

## 2016-12-27 ENCOUNTER — Telehealth (INDEPENDENT_AMBULATORY_CARE_PROVIDER_SITE_OTHER): Payer: Self-pay | Admitting: Internal Medicine

## 2016-12-27 NOTE — Telephone Encounter (Signed)
Patient called, stated that she got a call from Dr. Karilyn Cotaehman on Thursday and stated that he had her results back and that he would call her the next day.  She stated that she hasn't gotten a call back yet.  She would like to know the results.  She stated she will not be at home today, but please leave a message.  [phone]

## 2016-12-27 NOTE — Telephone Encounter (Signed)
I will make Dr.Rehman aware. 

## 2016-12-29 NOTE — Telephone Encounter (Signed)
Colon, biopsy, sigmoid - FINDINGS MOST CONSISTENT WITH COLLAGENOUS COLITIS. - THERE IS NO EVIDENCE OF IDIOPATHIC INFLAMMATORY BOWEL DISEASE, DYSPLASIA OR MALIGNANCY.

## 2016-12-29 NOTE — Telephone Encounter (Signed)
Forwarded to D.Rehman for review.

## 2016-12-31 ENCOUNTER — Telehealth (INDEPENDENT_AMBULATORY_CARE_PROVIDER_SITE_OTHER): Payer: Self-pay | Admitting: *Deleted

## 2016-12-31 ENCOUNTER — Other Ambulatory Visit: Payer: Self-pay | Admitting: Family Medicine

## 2016-12-31 NOTE — Telephone Encounter (Signed)
Per Dr.Rehman the patient will need to take Bunesonide  9 mg by mouth for 2 weeks, then she will take 6 mg by mouth for 2 weeks, then she will take 3 mg by mouth for 2 weeks. #84 no refills  This was called to [facility]. The patient was made aware and she is to keep a stool dairy unitl her office visit . If she is not feeling better in 2 weeks after taking the medication she is to call our office. Patient will need. OV with him on the next 4- 5 weeks.

## 2016-12-31 NOTE — Telephone Encounter (Signed)
Patient was called last week yesterday and this morning but no answer. Will treat her with budesonide 9 mg daily for 2 weeks followed by 6 mg daily for 2 weeks and then 3 mg daily for 2 weeks. Patient should keep stool diary and return for office visit in 2 months.

## 2016-12-31 NOTE — Telephone Encounter (Signed)
Last seen 10/12/16  Pamela BonitoChristy  This med not on list

## 2016-12-31 NOTE — Telephone Encounter (Signed)
Patient was called , message was left for her to call our office so that I may go over results and medication with her.

## 2017-01-06 NOTE — Telephone Encounter (Signed)
A staff message has been sent to Pamela Hughes for an appointment for this patient.

## 2017-02-08 ENCOUNTER — Encounter (INDEPENDENT_AMBULATORY_CARE_PROVIDER_SITE_OTHER): Payer: Self-pay | Admitting: Internal Medicine

## 2017-02-15 ENCOUNTER — Encounter (INDEPENDENT_AMBULATORY_CARE_PROVIDER_SITE_OTHER): Payer: Self-pay | Admitting: Internal Medicine

## 2017-02-15 ENCOUNTER — Encounter (INDEPENDENT_AMBULATORY_CARE_PROVIDER_SITE_OTHER): Payer: Self-pay

## 2017-02-15 ENCOUNTER — Ambulatory Visit (INDEPENDENT_AMBULATORY_CARE_PROVIDER_SITE_OTHER): Payer: Medicare Other | Admitting: Internal Medicine

## 2017-02-15 VITALS — BP 120/70 | HR 67 | Temp 98.3°F | Resp 18 | Ht 65.5 in | Wt 136.9 lb

## 2017-02-15 DIAGNOSIS — K52831 Collagenous colitis: Secondary | ICD-10-CM

## 2017-02-15 MED ORDER — BUDESONIDE 3 MG PO CPEP: 3.0000 mg | ORAL_CAPSULE | Freq: Every day | ORAL | 0 refills | Status: DC

## 2017-02-15 NOTE — Progress Notes (Signed)
Presenting complaint;  Follow-up for diarrhea/collagenous colitis.  Database and Subjective:  Patient is 76 year old Caucasian female who underwent diagnostic colonoscopy for chronic diarrhea unresponsive therapy for IBS. Colonoscopy was unremarkable except for external hemorrhoids and anal papillae. Sigmoid colon biopsy however showed collagenous colitis. She was treated with budesonide for 6 weeks. She noted resolution of diarrhea within few days. At times she felt she was constipated. She took last pill on 02/11/2017 and she is back to having diarrhea. She had diarrhea yesterday and she is already had 3 stools today. She is not having abdominal pain melena or rectal bleeding fever or chills. Before she went on this therapy she was having abdominal cramping. Her appetite remains good. She did not experience any side effects with budesonide.   Current Medications: Outpatient Encounter Prescriptions as of 02/15/2017  Medication Sig  . amLODipine (NORVASC) 5 MG tablet TAKE ONE (1) TABLET EACH DAY  . beta carotene w/minerals (OCUVITE) tablet Take 1 tablet by mouth daily.  Marland Kitchen buPROPion (WELLBUTRIN SR) 150 MG 12 hr tablet TAKE ONE TABLET BY MOUTH TWICE DAILY (Patient taking differently: TAKE ONE TABLET BY MOUTH ONCE DAILY IN THE MORNING.)  . Calcium Carbonate-Vitamin D (CALTRATE 600+D PO) Take 1 tablet by mouth daily.  . Carboxymethylcellulose Sodium (THERATEARS) 0.25 % SOLN Place 1 drop into both eyes daily.  . cholecalciferol (VITAMIN D) 1000 UNITS tablet Take 1,000 Units by mouth daily. 25 mcg  . clotrimazole (MYCELEX) 10 MG troche DISSOLVE 1 TABLET IN MOUTH 5 TIMES DAILY (Patient taking differently: DISSOLVE 1 TABLET IN MOUTH UP TO 5 TIMES DAILY IF NEEDED FOR MOUTH PAIN.)  . furosemide (LASIX) 20 MG tablet TAKE ONE TABLET BY MOUTH TWICE DAILY (Patient taking differently: TAKE ONE TABLET BY MOUTH ONCE DAILY IN THE MORNING.)  . Glucosamine-Chondroitin (MOVE FREE PO) Take 2 tablets by mouth daily.  Marland Kitchen  ipratropium (ATROVENT) 0.03 % nasal spray USE TWICE DAILY AS DIRECTED  . levocetirizine (XYZAL) 5 MG tablet Take 5 mg by mouth daily as needed for allergies.  Marland Kitchen loperamide (IMODIUM) 2 MG capsule Take 1 capsule (2 mg total) by mouth daily before breakfast.  . meloxicam (MOBIC) 7.5 MG tablet TAKE ONE (1) TABLET EACH DAY (Patient taking differently: TAKE ONE (1) TABLET DAILY AS NEEDED FOR BACK PAIN.)  . Multiple Vitamin (MULTIVITAMIN WITH MINERALS) TABS tablet Take 1 tablet by mouth daily. Centrum Silver  . omeprazole (PRILOSEC) 20 MG capsule TAKE ONE (1) CAPSULE EACH DAY (Patient taking differently: TAKE ONE (1) CAPSULE DAILY IN THE MORNING.)  . TRAVEL SICKNESS 25 MG CHEW TAKE ONE TABLET 3 TIMES A DAY AS NEEDED. (Patient taking differently: TAKE ONE TABLET 3 TIMES A DAY AS NEEDED FOR DIZZINESS.)  . traZODone (DESYREL) 150 MG tablet TAKE ONE TABLET DAILY AT BEDTIME  . [DISCONTINUED] polycarbophil (FIBERCON) 625 MG tablet Take 1,875 mg by mouth daily.  . [DISCONTINUED] Probiotic Product (PROBIOTIC PO) Take 1 capsule by mouth daily.   No facility-administered encounter medications on file as of 02/15/2017.      Objective: Blood pressure 120/70, pulse 67, temperature 98.3 F (36.8 C), temperature source Oral, resp. rate 18, height 5' 5.5" (1.664 m), weight 136 lb 14.4 oz (62.1 kg). Patient is alert and in no acute distress. Conjunctiva is pink. Sclera is nonicteric Oropharyngeal mucosa is normal. No neck masses or thyromegaly noted. Cardiac exam with regular rhythm normal S1 and S2. No murmur or gallop noted. Lungs are clear to auscultation. Abdomen is symmetrical. Bowel sounds are normal. On palpation abdomen  is soft with mild tenderness at LLQ. No organomegaly or masses. No LE edema or clubbing noted.   Assessment:  #1. 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 She experience 100% symptomatic improvement while on budesonide. However diarrhea has relapsed within few days of stopping  the medication. Therefore will treat her for 3 months at a low dose before other treatment options considered. She is on NSAID but she uses it sparingly.   Plan:  Budesonide 3 mg by mouth every morning. If she does not respond to 3 mg daily she can double up the dose to 6 mg daily. She will call office with progress report when she finishes 3 months therapy. Office visit in 6 months.

## 2017-02-15 NOTE — Patient Instructions (Signed)
If diarrhea is not controlled with 3 mg of budesonide daily can increase dose to 6 mg daily. Call office with progress report when you finish budesonide prescription.

## 2017-02-16 DIAGNOSIS — K52831 Collagenous colitis: Secondary | ICD-10-CM | POA: Insufficient documentation

## 2017-03-15 ENCOUNTER — Other Ambulatory Visit: Payer: Self-pay | Admitting: Family

## 2017-03-15 ENCOUNTER — Other Ambulatory Visit: Payer: Self-pay | Admitting: Family Medicine

## 2017-04-26 ENCOUNTER — Other Ambulatory Visit: Payer: Self-pay | Admitting: Family Medicine

## 2017-06-06 ENCOUNTER — Ambulatory Visit (INDEPENDENT_AMBULATORY_CARE_PROVIDER_SITE_OTHER): Payer: Medicare Other | Admitting: *Deleted

## 2017-06-06 ENCOUNTER — Encounter: Payer: Self-pay | Admitting: *Deleted

## 2017-06-06 ENCOUNTER — Ambulatory Visit (INDEPENDENT_AMBULATORY_CARE_PROVIDER_SITE_OTHER): Payer: Medicare Other

## 2017-06-06 VITALS — BP 128/71 | HR 74 | Ht 64.5 in | Wt 136.0 lb

## 2017-06-06 DIAGNOSIS — M8589 Other specified disorders of bone density and structure, multiple sites: Secondary | ICD-10-CM

## 2017-06-06 DIAGNOSIS — M8588 Other specified disorders of bone density and structure, other site: Secondary | ICD-10-CM | POA: Diagnosis not present

## 2017-06-06 DIAGNOSIS — Z0000 Encounter for general adult medical examination without abnormal findings: Secondary | ICD-10-CM | POA: Diagnosis not present

## 2017-06-06 DIAGNOSIS — Z780 Asymptomatic menopausal state: Secondary | ICD-10-CM

## 2017-06-06 DIAGNOSIS — M85852 Other specified disorders of bone density and structure, left thigh: Secondary | ICD-10-CM | POA: Diagnosis not present

## 2017-06-06 NOTE — Patient Instructions (Signed)
  Ms. Pamela Hughes , Thank you for taking time to come for your Medicare Wellness Visit. I appreciate your ongoing commitment to your health goals. Please review the following plan we discussed and let me know if I can assist you in the future.   These are the goals we discussed: Goals    . Exercise 150 min/wk Moderate Activity     Do chair exercises daily. Refer to handout.        This is a list of the screening recommended for you and due dates:  Health Maintenance  Topic Date Due  . Tetanus Vaccine  07/27/1959  . DEXA scan (bone density measurement)  07/26/2005  . Pneumonia vaccines (1 of 2 - PCV13) 07/26/2005  . Flu Shot  Completed   Your bone density scan was done today. You should receive a call about the results.   Look over the Advance Directives and bring a signed/notarized copy back to our office.

## 2017-06-06 NOTE — Progress Notes (Signed)
Subjective:   [name] is a 77 y.o. female who presents for an Initial Medicare Annual Wellness Visit. Ms Pamela Hughes is retired from the [employer] industry and lives in a one level home alone. She has one daughter and 3 grandchildren.   Review of Systems    Health is worse than last year due to colitis. Seeing Dr Karilyn Cotaehman for this.   Cardiac Risk Factors include: sedentary lifestyle;advanced age (>8055men, 35>65 women)  Other systems negative today.     Objective:    Today's Vitals   06/06/17 1053  BP: 128/71  Pulse: 74  Weight: 136 lb (61.7 kg)  Height: 5' 4.5" (1.638 m)   Body mass index is 22.98 kg/m.  Advanced Directives 06/06/2017 12/17/2016  Does Patient Have a Medical Advance Directive? No No  Does patient want to make changes to medical advance directive? Yes (MAU/Ambulatory/Procedural Areas - Information given) -  Would patient like information on creating a medical advance directive? Yes (Inpatient - patient defers creating a medical advance directive at this time) -    Current Medications (verified) Outpatient Encounter Medications as of 06/06/2017  Medication Sig  . amLODipine (NORVASC) 5 MG tablet TAKE ONE (1) TABLET EACH DAY  . beta carotene w/minerals (OCUVITE) tablet Take 1 tablet by mouth daily.  Marland Kitchen. buPROPion (WELLBUTRIN SR) 150 MG 12 hr tablet TAKE ONE TABLET BY MOUTH TWICE DAILY (Patient taking differently: TAKE ONE TABLET BY MOUTH ONCE DAILY IN THE MORNING.)  . Calcium Carbonate-Vitamin D (CALTRATE 600+D PO) Take 1 tablet by mouth daily.  . Carboxymethylcellulose Sodium (THERATEARS) 0.25 % SOLN Place 1 drop into both eyes daily.  . cholecalciferol (VITAMIN D) 1000 UNITS tablet Take 1,000 Units by mouth daily. 25 mcg  . clotrimazole (MYCELEX) 10 MG troche DISSOLVE 1 TABLET IN MOUTH 5 TIMES DAILY  . furosemide (LASIX) 20 MG tablet TAKE ONE TABLET BY MOUTH TWICE DAILY  . Glucosamine-Chondroitin (MOVE FREE PO) Take 2 tablets by mouth daily.  Marland Kitchen. ipratropium (ATROVENT) 0.03  % nasal spray USE TWICE DAILY AS DIRECTED  . levocetirizine (XYZAL) 5 MG tablet Take 5 mg by mouth daily as needed for allergies.  Marland Kitchen. loperamide (IMODIUM) 2 MG capsule Take 1 capsule (2 mg total) by mouth daily before breakfast.  . meloxicam (MOBIC) 7.5 MG tablet TAKE ONE (1) TABLET EACH DAY (Patient taking differently: TAKE ONE (1) TABLET DAILY AS NEEDED FOR BACK PAIN.)  . Multiple Vitamin (MULTIVITAMIN WITH MINERALS) TABS tablet Take 1 tablet by mouth daily. Centrum Silver  . omeprazole (PRILOSEC) 20 MG capsule TAKE ONE (1) CAPSULE EACH DAY (Patient taking differently: TAKE ONE (1) CAPSULE DAILY IN THE MORNING.)  . TRAVEL SICKNESS 25 MG CHEW TAKE ONE TABLET 3 TIMES A DAY AS NEEDED. (Patient taking differently: TAKE ONE TABLET 3 TIMES A DAY AS NEEDED FOR DIZZINESS.)  . traZODone (DESYREL) 150 MG tablet TAKE ONE TABLET DAILY AT BEDTIME  . budesonide (ENTOCORT EC) 3 MG 24 hr capsule Take 1 capsule (3 mg total) by mouth daily. (Patient not taking: Reported on 06/06/2017)   No facility-administered encounter medications on file as of 06/06/2017.     Allergies (verified) Patient has no known allergies.   History: Past Medical History:  Diagnosis Date  . Allergy   . Alopecia   . Arthritis   . Collagenous colitis   . Depression   . Diarrhea    chronic- "states for at least a year)  . Edema   . Hypertension    Past Surgical History:  Procedure Laterality Date  . ABDOMINAL HYSTERECTOMY     one ovary left  . COLONOSCOPY N/A 12/17/2016   Procedure: COLONOSCOPY;  Surgeon: Malissa Hippo, MD;  [facility];  Service: Endoscopy;  Laterality: N/A;  2:05  . SPINE SURGERY  1990s   L spine   Family History  Problem Relation Age of Onset  . Alzheimer's disease Mother   . Alcohol abuse Father   . Diabetes Brother   . Healthy Daughter    Social History   Socioeconomic History  . Marital status: Divorced    Spouse name: Not on file  . Number of children: 1  . Years of education:  12  . Highest education level: 10th grade  Social Needs  . Financial resource strain: Not very hard  . Food insecurity - worry: Never true  . Food insecurity - inability: Not on file  . Transportation needs - medical: No  . Transportation needs - non-medical: No  Occupational History  . Occupation: Retired    Comment: Designer, fashion/clothing  Tobacco Use  . Smoking status: Former Smoker    Last attempt to quit: 10/09/1974    Years since quitting: 42.6  . Smokeless tobacco: Never Used  Substance and Sexual Activity  . Alcohol use: Yes    Comment: glass of wine occasionally  . Drug use: No  . Sexual activity: Yes    Birth control/protection: Surgical  Other Topics Concern  . Not on file  Social History Narrative  . Not on file    Tobacco Counseling No tobacco use  Clinical Intake:   Nutritional Status: BMI of 19-24  Normal  Activities of Daily Living: Independent Ambulation: Independent Medication Administration: Independent Home Management: Independent     Do you feel unsafe in your current relationship?: No Do you feel physically threatened by others?: No Anyone hurting you at home, work, or school?: No Unable to ask?: No Information provided on Community resources: No  How often do you need to have someone help you when you read instructions, pamphlets, or other written materials from your doctor or pharmacy?: 1 - Never  Interpreter Needed?: No     Activities of Daily Living In your present state of health, do you have any difficulty performing the following activities: 06/06/2017  Hearing? Y  Comment noticed some decrease in hearing over the past year or two. Considering seeing an audiologist.   Vision? Y  Comment Has cataracts. Last saw Lorna Dibble, OD last year.   Difficulty concentrating or making decisions? Y  Walking or climbing stairs? N  Comment Uses handrails on porch. One story home .  Dressing or bathing? N  Doing errands, shopping? N  Preparing Food and  eating ? N  Using the Toilet? N  In the past six months, have you accidently leaked urine? N  Do you have problems with loss of bowel control? Y  Comment Colitis. Being seen by Dr Karilyn Cota  Managing your Medications? N  Comment Uses a pill box to organize  Managing your Finances? N  Housekeeping or managing your Housekeeping? N  Some recent data might be hidden     Immunizations and Health Maintenance Immunization History  Administered Date(s) Administered  . Influenza, High Dose Seasonal PF 02/25/2016, 03/03/2017  . Influenza,inj,Quad PF,6+ Mos 02/27/2015   Health Maintenance Due  Topic Date Due  . TETANUS/TDAP  07/27/1959  . DEXA SCAN  07/26/2005  . PNA vac Low Risk Adult (1 of 2 - PCV13) 07/26/2005  Patient Care Team: Elenora Gamma, MD as PCP - General (Family Medicine) Malissa Hippo, MD as Consulting Physician (Gastroenterology)  Hospitalization 11/2016 for collagenous colitis.    Assessment:   This is a routine wellness examination for Pamela Hughes.  Hearing/Vision screen Hearing deficit noted during visit. Eye exam is up to date.   Dietary issues and exercise activities discussed: Current Exercise Habits: The patient does not participate in regular exercise at present(Was walking a mile a day until the weather got cold), Exercise limited by: None identified  Goals    . Exercise 150 min/wk Moderate Activity     Do chair exercises daily. Refer to handout.       Depression Screen PHQ 2/9 Scores 06/06/2017 10/12/2016 09/09/2016 05/18/2016 12/10/2015 12/10/2015 06/27/2015  PHQ - 2 Score 3 1 1 4 2  0 1  PHQ- 9 Score 15 - - 20 11 - -    Fall Risk Fall Risk  06/06/2017 10/12/2016 09/09/2016 05/18/2016 12/10/2015  Falls in the past year? No No No No No    Is the patient's home free of loose throw rugs in walkways, pet beds, electrical cords, etc?   yes      Grab bars in the bathroom? no      Handrails on the stairs?   yes      Adequate lighting?   yes   Cognitive  Function: MMSE - Mini Mental State Exam 06/06/2017  Orientation to time 5  Orientation to Place 5  Registration 3  Attention/ Calculation 5  Recall 3  Language- name 2 objects 2  Language- repeat 1  Language- follow 3 step command 2  Language- read & follow direction 1  Write a sentence 1  Copy design 0  Total score 28   Normal exam    Screening Tests Health Maintenance  Topic Date Due  . TETANUS/TDAP  07/27/1959  . DEXA SCAN  07/26/2005  . PNA vac Low Risk Adult (1 of 2 - PCV13) 07/26/2005  . INFLUENZA VACCINE  Completed   Patient believes that she had a pneumonia shot at the pharmacy. She isn't certain of which pharmacy or the date. She does recall having a shingles vaccine at the [facility].    Plan:   Dexa scan done today. Keep f/u with Dr Ermalinda Memos Move carefully to avoid falls Mammogram appointment scheduled  I have personally reviewed and noted the following in the patient's chart:   . Medical and social history . Use of alcohol, tobacco or illicit drugs  . Current medications and supplements . Functional ability and status . Nutritional status . Physical activity . Advanced directives . List of other physicians . Hospitalizations, surgeries, and ER visits in previous 12 months . Vitals . Screenings to include cognitive, depression, and falls . Referrals and appointments  In addition, I have reviewed and discussed with patient certain preventive protocols, quality metrics, and best practice recommendations. A written personalized care plan for preventive services as well as general preventive health recommendations were provided to patient.     [name], RN   06/06/2017    I have reviewed and agree with the above AWV documentation.   Murtis Sink, MD [facility] 06/06/2017, 1:26 PM

## 2017-06-07 ENCOUNTER — Encounter (INDEPENDENT_AMBULATORY_CARE_PROVIDER_SITE_OTHER): Payer: Self-pay | Admitting: Internal Medicine

## 2017-06-07 ENCOUNTER — Ambulatory Visit (INDEPENDENT_AMBULATORY_CARE_PROVIDER_SITE_OTHER): Payer: Medicare Other | Admitting: Internal Medicine

## 2017-06-07 ENCOUNTER — Telehealth: Payer: Self-pay | Admitting: Family Medicine

## 2017-06-07 VITALS — BP 142/62 | HR 68 | Temp 98.3°F | Ht 60.0 in | Wt 134.5 lb

## 2017-06-07 DIAGNOSIS — K52831 Collagenous colitis: Secondary | ICD-10-CM | POA: Diagnosis not present

## 2017-06-07 NOTE — Telephone Encounter (Signed)
Pamela Hughes picked up call

## 2017-06-07 NOTE — Progress Notes (Signed)
Subjective:    Patient ID: Pamela Hughes, female    DOB: Mar 02, 1941, 77 y.o.   MRN: [id number]  HPI Presents today with c/o diarrhea. She states she has been having diarrhea. She is having about 4-5 stools a day. She is taking Imodium twice a day. She says her stools are better. She is not getting up at night to have a BM.   She is not taking any ASA. She takes Meloxicam, as needed.  Stools are not formed, nor have been formed in a long time. Stools are always watery.  Appetite is okay. She has maintained her wt.      Hx of lymphocytic colitis. Underwent a colonoscopy in July of 2018 for chronic diarrhea. Colonoscopy normal except for external hemorrhoids. Biopsy revealed Biopsy revealed collagenous colitis. Per records, Dr. Karilyn Cota treated with Budesanide 9mg  x 2 weeks, 6mg  x 2 weeks and 3 mg x 2 weeks. She was last seen by Dr. Karilyn Cota in September.    Review of Systems Past Medical History:  Diagnosis Date  . Allergy   . Alopecia   . Arthritis   . Collagenous colitis   . Depression   . Diarrhea    chronic- "states for at least a year)  . Edema   . Hypertension     Past Surgical History:  Procedure Laterality Date  . ABDOMINAL HYSTERECTOMY     one ovary left  . COLONOSCOPY N/A 12/17/2016   Procedure: COLONOSCOPY;  Surgeon: Malissa Hippo, MD;  Location: [facility];  Service: Endoscopy;  Laterality: N/A;  2:05  . SPINE SURGERY  1990s   L spine    No Known Allergies  Current Outpatient Medications on File Prior to Visit  Medication Sig Dispense Refill  . amLODipine (NORVASC) 5 MG tablet TAKE ONE (1) TABLET EACH DAY 90 tablet 0  . beta carotene w/minerals (OCUVITE) tablet Take 1 tablet by mouth daily.    Marland Kitchen buPROPion (WELLBUTRIN SR) 150 MG 12 hr tablet TAKE ONE TABLET BY MOUTH TWICE DAILY (Patient taking differently: TAKE ONE TABLET BY MOUTH ONCE DAILY IN THE MORNING.) 180 tablet 0  . Calcium Carbonate-Vitamin D (CALTRATE 600+D PO) Take 1 tablet by mouth daily.    .  Carboxymethylcellulose Sodium (THERATEARS) 0.25 % SOLN Place 1 drop into both eyes daily.    . cholecalciferol (VITAMIN D) 1000 UNITS tablet Take 1,000 Units by mouth daily. 25 mcg    . clotrimazole (MYCELEX) 10 MG troche DISSOLVE 1 TABLET IN MOUTH 5 TIMES DAILY 35 lozenge 0  . furosemide (LASIX) 20 MG tablet TAKE ONE TABLET BY MOUTH TWICE DAILY 180 tablet 0  . Glucosamine-Chondroitin (MOVE FREE PO) Take 2 tablets by mouth daily.    Marland Kitchen ipratropium (ATROVENT) 0.03 % nasal spray USE TWICE DAILY AS DIRECTED 30 mL 3  . levocetirizine (XYZAL) 5 MG tablet Take 5 mg by mouth daily as needed for allergies.    Marland Kitchen loperamide (IMODIUM) 2 MG capsule Take 1 capsule (2 mg total) by mouth daily before breakfast. 30 capsule   . meloxicam (MOBIC) 7.5 MG tablet TAKE ONE (1) TABLET EACH DAY (Patient taking differently: TAKE ONE (1) TABLET DAILY AS NEEDED FOR BACK PAIN.) 90 tablet 0  . Multiple Vitamin (MULTIVITAMIN WITH MINERALS) TABS tablet Take 1 tablet by mouth daily. Centrum Silver    . omeprazole (PRILOSEC) 20 MG capsule TAKE ONE (1) CAPSULE EACH DAY (Patient taking differently: TAKE ONE (1) CAPSULE DAILY IN THE MORNING.) 90 capsule 0  . TRAVEL  SICKNESS 25 MG CHEW TAKE ONE TABLET 3 TIMES A DAY AS NEEDED. (Patient taking differently: TAKE ONE TABLET 3 TIMES A DAY AS NEEDED FOR DIZZINESS.) 30 each 3  . traZODone (DESYREL) 150 MG tablet TAKE ONE TABLET DAILY AT BEDTIME 90 tablet 1  . budesonide (ENTOCORT EC) 3 MG 24 hr capsule Take 1 capsule (3 mg total) by mouth daily. (Patient not taking: Reported on 06/06/2017) 90 capsule 0   No current facility-administered medications on file prior to visit.         Objective:   Physical Exam Blood pressure (!) 142/62, pulse 68, temperature 98.3 F (36.8 C), height 5' (1.524 m), weight 134 lb 8 oz (61 kg). Alert and oriented. Skin warm and dry. Oral mucosa is moist.   . Sclera anicteric, conjunctivae is pink. Thyroid not enlarged. No cervical lymphadenopathy. Lungs clear.  Heart regular rate and rhythm. Murmur heard.  Abdomen is soft. Bowel sounds are positive. No hepatomegaly. No abdominal masses felt. No tenderness.  No edema to lower extremities.         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  She relapsed after finished Budesonide.  Will start back on at 6mg  x 2 weeks then 3 mg x 2 weeks. OV in 6 months.

## 2017-06-07 NOTE — Patient Instructions (Signed)
Rx for Budesonide given to patient. 6mg  x 2 weeks then 3mg  x 2 weeks.

## 2017-06-10 ENCOUNTER — Other Ambulatory Visit: Payer: Self-pay | Admitting: Family Medicine

## 2017-06-10 ENCOUNTER — Encounter: Payer: Self-pay | Admitting: Family Medicine

## 2017-06-10 ENCOUNTER — Ambulatory Visit (INDEPENDENT_AMBULATORY_CARE_PROVIDER_SITE_OTHER): Payer: Medicare Other | Admitting: Family Medicine

## 2017-06-10 VITALS — BP 139/75 | HR 81 | Temp 97.3°F | Ht 61.0 in | Wt 135.2 lb

## 2017-06-10 DIAGNOSIS — M7989 Other specified soft tissue disorders: Secondary | ICD-10-CM | POA: Diagnosis not present

## 2017-06-10 DIAGNOSIS — I10 Essential (primary) hypertension: Secondary | ICD-10-CM

## 2017-06-10 DIAGNOSIS — R42 Dizziness and giddiness: Secondary | ICD-10-CM | POA: Diagnosis not present

## 2017-06-10 MED ORDER — CHLORTHALIDONE 25 MG PO TABS: 25.0000 mg | ORAL_TABLET | Freq: Every day | ORAL | 1 refills | Status: DC

## 2017-06-10 NOTE — Patient Instructions (Signed)
Great to see you!  I recommend that you stop amlodipine and Lasix and start chlorthalidone 1 tablet once daily instead.  My hope is that this will control your blood pressure and control your fluid without causing any additional leg swelling.   It is possible that Lasix is actually causing a little bit more fluid loss then you need causing your unsteadiness type dizziness.

## 2017-06-10 NOTE — Progress Notes (Signed)
   HPI  Patient presents today with dizziness and leg swelling.  She explains that she has had dizziness off and on for about 1 year.  She states that it is worse with bending forward and describes it as an unsteadiness type sensation. She states that it is also much like bloating.  Patient takes amlodipine daily, she feels this may also be causing her leg swelling.  She states she has had bilateral calf swelling for years, however her right foot has begun to swell and over the last several months.  She uses Lasix 1 pill once daily but she states that on some days she takes a second pill to help with elevated blood pressure  PMH: Smoking status noted ROS: Per HPI  Objective: BP 139/75   Pulse 81   Temp (!) 97.3 F (36.3 C) (Oral)   Ht 5\' 1"  (1.549 m)   Wt 135 lb 3.2 oz (61.3 kg)   BMI 25.55 kg/m  Gen: NAD, alert, cooperative with exam HEENT: NCAT, EOMI, TMs normal bilaterally CV: RRR, good S1/S2 Resp: CTABL, no wheezes, non-labored Ext: Trace pitting edema bilateral lower extremities, symmetric Neuro: Alert and oriented, No gross deficits  Assessment and plan:  #Dizziness Likely multifactorial, sensation of floating, not consistent with vertigo Meclizine is mildly helpful Discontinue Lasix and amlodipine, more reasoning below  #Leg swelling Patient sensing more leg swelling late lately, I believe amlodipine could be the primary reason for this. Discontinue amlodipine, replace with chlorthalidone Discontinue Lasix, hopefully we will have the same net effect with less dizziness and less leg swelling with a single medication  #Hypertension Controlled with amlodipine plus Lasix Discontinue both, start chlorthalidone, follow-up 1 month    Murtis SinkSam Bradshaw, MD [facility] 06/10/2017, 3:27 PM

## 2017-06-23 ENCOUNTER — Other Ambulatory Visit: Payer: Self-pay | Admitting: Family Medicine

## 2017-07-11 ENCOUNTER — Encounter: Payer: Self-pay | Admitting: Family Medicine

## 2017-07-11 ENCOUNTER — Ambulatory Visit (INDEPENDENT_AMBULATORY_CARE_PROVIDER_SITE_OTHER): Payer: Medicare Other | Admitting: Family Medicine

## 2017-07-11 VITALS — BP 143/82 | HR 85 | Temp 97.5°F | Ht 61.0 in | Wt 135.6 lb

## 2017-07-11 DIAGNOSIS — M7989 Other specified soft tissue disorders: Secondary | ICD-10-CM | POA: Diagnosis not present

## 2017-07-11 DIAGNOSIS — E785 Hyperlipidemia, unspecified: Secondary | ICD-10-CM | POA: Diagnosis not present

## 2017-07-11 DIAGNOSIS — I10 Essential (primary) hypertension: Secondary | ICD-10-CM | POA: Diagnosis not present

## 2017-07-11 DIAGNOSIS — R42 Dizziness and giddiness: Secondary | ICD-10-CM | POA: Diagnosis not present

## 2017-07-11 NOTE — Patient Instructions (Signed)
Great to see you!   

## 2017-07-11 NOTE — Progress Notes (Signed)
   HPI  Patient presents today here for follow-up hypertension and dizziness.  Patient states that she continues to have unsteadiness type dizziness with worsening with bending her head forward. She states that she is tolerating chlorthalidone well. She is discontinued amlodipine and Lasix.  Her leg swelling has improved but she still has some mild right greater than left leg swelling.  Dizziness has now been going on for about 3 months.   PMH: Smoking status noted ROS: Per HPI  Objective: BP (!) 143/82   Pulse 85   Temp (!) 97.5 F (36.4 C) (Oral)   Ht _0  (1.549 m)   Wt 135 lb 9.6 oz (61.5 kg)   BMI 25.62 kg/m  Gen: NAD, alert, cooperative with exam HEENT: NCAT, EOMI, PERRL CV: RRR, good S1/S2, no murmur Resp: CTABL, no wheezes, non-labored Abd: SNTND, BS present, no guarding or organomegaly Ext: No edema, warm Neuro: Alert and oriented, No gross deficits  Assessment and plan:  #Dizziness Unclear etiology, I initially felt that this was not vertiginous in nature, however it may be considering that she has worsening symptoms with tilting her head forward. Refer to ENT, appreciate the recommendations  #Hypertension Discontinued amlodipine plus Lasix, changed over to chlorthalidone alone, she seems to be doing well with this.   #Leg swelling Improved, mild, none on exam  #Hyperlipidemia No medications, repeat labs today nonfasting    Orders Placed This Encounter  Procedures  . LDL Cholesterol, Direct  . CMP14+EGFR  . CBC with Differential/Platelet  . Thyroid Panel With TSH  . Lipid panel  . Ambulatory referral to ENT    Referral Priority:   Routine    Referral Type:   Consultation    Referral Reason:   Specialty Services Required    Referred to Provider:   Leta Baptist, MD    Requested Specialty:   Otolaryngology    Number of Visits Requested:   [place], MD [facility] 07/11/2017, 3:55 PM

## 2017-07-12 ENCOUNTER — Encounter (HOSPITAL_COMMUNITY): Payer: Self-pay | Admitting: Psychiatry

## 2017-07-12 ENCOUNTER — Telehealth: Payer: Self-pay

## 2017-07-12 DIAGNOSIS — F321 Major depressive disorder, single episode, moderate: Secondary | ICD-10-CM

## 2017-07-12 LAB — LIPID PANEL
CHOLESTEROL TOTAL: 269 mg/dL — AB (ref 100–199)
Chol/HDL Ratio: 2.6 ratio (ref 0.0–4.4)
HDL: 105 mg/dL (ref >39)
LDL Calculated: 145 mg/dL — ABNORMAL HIGH (ref 0–99)
Triglycerides: 94 mg/dL (ref 0–149)
VLDL CHOLESTEROL CAL: 19 mg/dL (ref 5–40)

## 2017-07-12 LAB — THYROID PANEL WITH TSH
FREE THYROXINE INDEX: 2.2 (ref 1.2–4.9)
T3 Uptake Ratio: 27 % (ref 24–39)
T4, Total: 8 ug/dL (ref 4.5–12.0)
TSH: 1.41 u[IU]/mL (ref 0.450–4.500)

## 2017-07-12 LAB — CBC WITH DIFFERENTIAL/PLATELET
Basophils Absolute: 0 10*3/uL (ref 0.0–0.2)
Basos: 1 %
EOS (ABSOLUTE): 0.2 10*3/uL (ref 0.0–0.4)
EOS: 3 %
HEMOGLOBIN: 12.7 g/dL (ref 11.1–15.9)
Hematocrit: 35.7 % (ref 34.0–46.6)
IMMATURE GRANS (ABS): 0 10*3/uL (ref 0.0–0.1)
IMMATURE GRANULOCYTES: 0 %
LYMPHS: 29 %
Lymphocytes Absolute: 1.9 10*3/uL (ref 0.7–3.1)
MCH: 32.2 pg (ref 26.6–33.0)
MCHC: 35.6 g/dL (ref 31.5–35.7)
MCV: 90 fL (ref 79–97)
MONOCYTES: 8 %
Monocytes Absolute: 0.6 10*3/uL (ref 0.1–0.9)
NEUTROS ABS: 3.9 10*3/uL (ref 1.4–7.0)
Neutrophils: 59 %
Platelets: 433 10*3/uL — ABNORMAL HIGH (ref 150–379)
RBC: 3.95 x10E6/uL (ref 3.77–5.28)
RDW: 13.8 % (ref 12.3–15.4)
WBC: 6.6 10*3/uL (ref 3.4–10.8)

## 2017-07-12 LAB — CMP14+EGFR
A/G RATIO: 1.4 (ref 1.2–2.2)
ALT: 12 IU/L (ref 0–32)
AST: 19 IU/L (ref 0–40)
Albumin: 4 g/dL (ref 3.5–4.8)
Alkaline Phosphatase: 96 IU/L (ref 39–117)
BUN/Creatinine Ratio: 15 (ref 12–28)
BUN: 17 mg/dL (ref 8–27)
Bilirubin Total: 0.3 mg/dL (ref 0.0–1.2)
CALCIUM: 9.9 mg/dL (ref 8.7–10.3)
CO2: 26 mmol/L (ref 20–29)
CREATININE: 1.16 mg/dL — AB (ref 0.57–1.00)
Chloride: 93 mmol/L — ABNORMAL LOW (ref 96–106)
GFR, EST AFRICAN AMERICAN: 53 mL/min/{1.73_m2} — AB (ref >59)
GFR, EST NON AFRICAN AMERICAN: 46 mL/min/{1.73_m2} — AB (ref >59)
GLUCOSE: 97 mg/dL (ref 65–99)
Globulin, Total: 2.8 g/dL (ref 1.5–4.5)
Potassium: 4.3 mmol/L (ref 3.5–5.2)
Sodium: 135 mmol/L (ref 134–144)
TOTAL PROTEIN: 6.8 g/dL (ref 6.0–8.5)

## 2017-07-12 LAB — LDL CHOLESTEROL, DIRECT: LDL DIRECT: 165 mg/dL — AB (ref 0–99)

## 2017-07-12 NOTE — Progress Notes (Signed)
[facility] [place] [facility] Telephone Follow-up  MRN: [id number] NAME: Pamela Hughes Date: 07/12/17 Time of Assessment: 3:31 PM Call number: 1/6  Reason for call today: Reason for Contact: Initial Assessment  PHQ-9 Scores:  [facility] 07/12/2017 07/11/2017 06/10/2017 06/06/2017 10/12/2016  Decreased Interest 3 3 0 0 0  Down, Depressed, Hopeless 2 3 1 3 1   PHQ - 2 Score 5 6 1 3 1   Altered sleeping 0 3 - 1 -  Tired, decreased energy 3 3 - 3 -  Change in appetite 0 0 - 1 -  Feeling bad or failure about yourself  3 3 - 3 -  Trouble concentrating 1 3 - 3 -  Moving slowly or fidgety/restless 2 0 - 1 -  Suicidal thoughts 0 0 - 0 -  PHQ-9 Score 14 18 - 15 -  Difficult doing work/chores - - - - -      .Marland Kitchen. GAD 7 : Generalized Anxiety Score 07/12/2017  Nervous, Anxious, on Edge 0  Control/stop worrying 0  Worry too much - different things 0  Trouble relaxing 0  Restless 0  Easily annoyed or irritable 0  Afraid - awful might happen 0  Total GAD 7 Score 0    Stress Current stressors: Current Stressors: Other (Comment)(Lonlliness and dizzieness) Sleep: Sleep: No problems Appetite: Appetite: No problems Coping ability: Coping ability: Normal Patient taking medications as prescribed: Patient taking medications as prescribed: Yes  Current medications:  Outpatient Encounter Medications as of 07/12/2017  Medication Sig  . beta carotene w/minerals (OCUVITE) tablet Take 1 tablet by mouth daily.  Marland Kitchen. buPROPion (WELLBUTRIN SR) 150 MG 12 hr tablet TAKE ONE TABLET BY MOUTH TWICE DAILY (Patient taking differently: TAKE ONE TABLET BY MOUTH ONCE DAILY IN THE MORNING.)  . Calcium Carbonate-Vitamin D (CALTRATE 600+D PO) Take 1 tablet by mouth daily.  . Carboxymethylcellulose Sodium (THERATEARS) 0.25 % SOLN Place 1 drop into both eyes daily.  . chlorthalidone (HYGROTON) 25 MG tablet TAKE ONE (1) TABLET EACH DAY  . cholecalciferol (VITAMIN D) 1000 UNITS tablet Take 1,000 Units by mouth daily. 25  mcg  . clotrimazole (MYCELEX) 10 MG troche DISSOLVE 1 TABLET IN MOUTH 5 TIMES DAILY  . Glucosamine-Chondroitin (MOVE FREE PO) Take 2 tablets by mouth daily.  Marland Kitchen. ipratropium (ATROVENT) 0.03 % nasal spray USE TWICE DAILY AS DIRECTED  . levocetirizine (XYZAL) 5 MG tablet Take 5 mg by mouth daily as needed for allergies.  Marland Kitchen. loperamide (IMODIUM) 2 MG capsule Take 1 capsule (2 mg total) by mouth daily before breakfast.  . meloxicam (MOBIC) 7.5 MG tablet TAKE ONE (1) TABLET EACH DAY (Patient taking differently: TAKE ONE (1) TABLET DAILY AS NEEDED FOR BACK PAIN.)  . Multiple Vitamin (MULTIVITAMIN WITH MINERALS) TABS tablet Take 1 tablet by mouth daily. Centrum Silver  . omeprazole (PRILOSEC) 20 MG capsule TAKE ONE (1) CAPSULE EACH DAY (Patient taking differently: TAKE ONE (1) CAPSULE DAILY IN THE MORNING.)  . TRAVEL SICKNESS 25 MG CHEW TAKE ONE TABLET 3 TIMES A DAY AS NEEDED. (Patient taking differently: TAKE ONE TABLET 3 TIMES A DAY AS NEEDED FOR DIZZINESS.)  . traZODone (DESYREL) 150 MG tablet TAKE ONE TABLET DAILY AT BEDTIME   No facility-administered encounter medications on file as of 07/12/2017.      Self-harm Behaviors Risk Assessment Self-harm risk factors:   Patient endorses recent thoughts of harming self: Have you recently had any thoughts about harming yourself?: No  [place] Suicide Severity Rating Scale: No flowsheet data found.  No flowsheet data found.   Danger to Others Risk Assessment Danger to others risk factors: Danger to Others Risk Factors: No risk factors noted Patient endorses recent thoughts of harming others: Notification required: No need or identified person  Dynamic Appraisal of Situational Aggression (DASA): No flowsheet data found.   Goals, Interventions and Follow-up Plan Goals:  Patient will reduce overall level, frequency, and intensity of depression so that daily functioning is not impaired as evidenced by a reduction in the PHQ-9 and the GAD-7 score.    Interventions:  [facility] Counselor will promote and assist consumer with an exercise regimen to relieve depression symptoms.   [facility] Counselor will identify through journaling irrational extinguish irrational beliefs and conclusions that contribute to depression.   Follow-up Plan: [facility] 2 week follow up phone calls.     Summary:  Patient is a 77 year old female that reports increased depression associated with continued medical issues with her high blood pressure that is causing her to be continuously dizzy.  Patient reports living alone and feeling lonely.   Patient reports a history of mental illness.  Patient reports that she was diagnosed with Major Depression Disorder in the past and took Prozac for over 44yrs.  Patient report that she had to be switched to Wellbutrin because the Prozac was making her sick to the point that she was throwing up.  Patient has been off Prozac for a couple of years.  Patient denies any side effects with the Wellbutrin.    Patient reports that she began taking Lexapro yesterday.    Patient denies any problems with her sleep because she takes medication to help her to sleep at night.   Patient reports that her appetite is very good.  Patient reports that she has been divorced and widowed and she now lives alone. Patient reports that she only has one daughter that she does not see very often.  Patient reports that she is lonely at times because she lives alone and has few friends.  Patient reports that she spends a lot of time with the dog Pricilla Holm).   Patient denies a history of mania.  Patient denies decreased need for sleep. Patient denies euphoria.  Patient denies past suicide attempts. Patient denies any past or present self-injurious behaviors.  Patient denies a family history of mental illness.  Patient denies a family history of substance abuse.  Patient denies a family history of suicide. Patient denies a history of substance abuse. Patient denies DUI.  Patient  denies SI/HI.  Patient denies a history of violence.  Patient denies AH/VH/paranoia.  Patient denies physical, sexual or emotional abuse.    Patient denies outpatient therapy.  Patient reports receiving prozc in the past from a Psychiatrist but now she only receives her psychiatric medication from her PCP.  Marland Kitchen Patient denies prior inpatient psychiatric hospitalization.   Phillip Heal LaVerne, LCAS-A

## 2017-07-12 NOTE — Telephone Encounter (Signed)
This encounter was created in error - please disregard.

## 2017-07-13 NOTE — Progress Notes (Signed)
[facility] ([facility]) Psychiatric Consultant Case Review   Summary Pamela Hughes is a 77 y.o. year old female with history of depression, hypertension, chronic diarrhea with collagenous colitis. Per chart, "she reminisces about one of her ex-husband's that committed suicide, she found the body."  Functional Impairment: n/a Psychosocial factors:loneliness, medical issues with hypertension, dizziness  Current Medications Current Outpatient Medications on File Prior to Visit  Medication Sig Dispense Refill  . beta carotene w/minerals (OCUVITE) tablet Take 1 tablet by mouth daily.    Marland Kitchen. buPROPion (WELLBUTRIN SR) 150 MG 12 hr tablet TAKE ONE TABLET BY MOUTH TWICE DAILY (Patient taking differently: TAKE ONE TABLET BY MOUTH ONCE DAILY IN THE MORNING.) 180 tablet 0  . Calcium Carbonate-Vitamin D (CALTRATE 600+D PO) Take 1 tablet by mouth daily.    . Carboxymethylcellulose Sodium (THERATEARS) 0.25 % SOLN Place 1 drop into both eyes daily.    . chlorthalidone (HYGROTON) 25 MG tablet TAKE ONE (1) TABLET EACH DAY 90 tablet 1  . cholecalciferol (VITAMIN D) 1000 UNITS tablet Take 1,000 Units by mouth daily. 25 mcg    . clotrimazole (MYCELEX) 10 MG troche DISSOLVE 1 TABLET IN MOUTH 5 TIMES DAILY 35 lozenge 0  . Glucosamine-Chondroitin (MOVE FREE PO) Take 2 tablets by mouth daily.    Marland Kitchen. ipratropium (ATROVENT) 0.03 % nasal spray USE TWICE DAILY AS DIRECTED 30 mL 3  . levocetirizine (XYZAL) 5 MG tablet Take 5 mg by mouth daily as needed for allergies.    Marland Kitchen. loperamide (IMODIUM) 2 MG capsule Take 1 capsule (2 mg total) by mouth daily before breakfast. 30 capsule   . meloxicam (MOBIC) 7.5 MG tablet TAKE ONE (1) TABLET EACH DAY (Patient taking differently: TAKE ONE (1) TABLET DAILY AS NEEDED FOR BACK PAIN.) 90 tablet 0  . Multiple Vitamin (MULTIVITAMIN WITH MINERALS) TABS tablet Take 1 tablet by mouth daily. Centrum Silver    . omeprazole (PRILOSEC) 20 MG capsule TAKE ONE (1) CAPSULE EACH DAY  (Patient taking differently: TAKE ONE (1) CAPSULE DAILY IN THE MORNING.) 90 capsule 0  . TRAVEL SICKNESS 25 MG CHEW TAKE ONE TABLET 3 TIMES A DAY AS NEEDED. (Patient taking differently: TAKE ONE TABLET 3 TIMES A DAY AS NEEDED FOR DIZZINESS.) 30 each 3  . traZODone (DESYREL) 150 MG tablet TAKE ONE TABLET DAILY AT BEDTIME 90 tablet 1   No current facility-administered medications on file prior to visit.      Past psychiatry history Outpatient: diagnosed with depression for 20 years Psychiatry admission: denies Previous suicide attempt: denies Past trials of medication: fluoxetine (nausea), wellburin History of violence: denies  Current measures [facility] 07/12/2017 07/11/2017 06/10/2017 06/06/2017 10/12/2016  Decreased Interest 3 3 0 0 0  Down, Depressed, Hopeless 2 3 1 3 1   PHQ - 2 Score 5 6 1 3 1   Altered sleeping 0 3 - 1 -  Tired, decreased energy 3 3 - 3 -  Change in appetite 0 0 - 1 -  Feeling bad or failure about yourself  3 3 - 3 -  Trouble concentrating 1 3 - 3 -  Moving slowly or fidgety/restless 2 0 - 1 -  Suicidal thoughts 0 0 - 0 -  PHQ-9 Score 14 18 - 15 -  Difficult doing work/chores - - - - -   GAD 7 : Generalized Anxiety Score 07/12/2017  Nervous, Anxious, on Edge 0  Control/stop worrying 0  Worry too much - different things 0  Trouble relaxing 0  Restless 0  Easily annoyed or irritable 0  Afraid - awful might happen 0  Total GAD 7 Score 0    Goals (patient centered) Decrease depression  Assessment/Provisional Diagnosis # MDD, recurrent without psychotic features Would recommend uptitration of Wellbutrin to target depression.   Recommendation - Consider uptitration of Wellbutrin SR to 150 mg BID (per record, patient takes 150 mg SR daily. If patient already takes 150 mg BID, then uptitrate Wellbutrin SR to 200 mg BID if no history of seizure or headache.) - BH specialist to work on behavioral activation   Thank you for your consult. We will  continue to follow the patient. Please contact [facility]  for any questions or concerns.   The above treatment considerations and suggestions are based on consultation with the [facility] specialist and/or PCP and a review of information available in the shared registry and the patient's Electronic Health Record (EHR). I have not personally examined the patient. All recommendations should be implemented with consideration of the patient's relevant prior history and current clinical status. Please feel free to call me with any questions about the care of this patient.

## 2017-07-26 ENCOUNTER — Telehealth: Payer: Self-pay

## 2017-07-26 DIAGNOSIS — Z1231 Encounter for screening mammogram for malignant neoplasm of breast: Secondary | ICD-10-CM | POA: Diagnosis not present

## 2017-07-26 LAB — HM MAMMOGRAPHY

## 2017-07-26 NOTE — Telephone Encounter (Signed)
[facility] - left message.  

## 2017-08-02 ENCOUNTER — Telehealth: Payer: Self-pay

## 2017-08-02 DIAGNOSIS — F331 Major depressive disorder, recurrent, moderate: Secondary | ICD-10-CM

## 2017-08-02 NOTE — Progress Notes (Signed)
[place] [facility] Telephone Follow-up  MRN: [id number] NAME: Pamela Hughes Date: 08/02/17 Time of Assessment: 2:08 PM Call number: 2/6  Reason for call today:    PHQ-9 Scores:  Depression screen [facility] 2/9 08/02/2017 07/12/2017 07/11/2017 06/10/2017 06/06/2017  Decreased Interest 2 3 3  0 0  Down, Depressed, Hopeless 2 2 3 1 3   PHQ - 2 Score 4 5 6 1 3   Altered sleeping 1 0 3 - 1  Tired, decreased energy 2 3 3  - 3  Change in appetite 0 0 0 - 1  Feeling bad or failure about yourself  1 3 3  - 3  Trouble concentrating 1 1 3  - 3  Moving slowly or fidgety/restless 1 2 0 - 1  Suicidal thoughts 0 0 0 - 0  PHQ-9 Score 10 14 18  - 15  Difficult doing work/chores - - - - -     .Marland Kitchen GAD 7 : Generalized Anxiety Score 08/02/2017 07/12/2017  Nervous, Anxious, on Edge 0 0  Control/stop worrying 0 0  Worry too much - different things 0 0  Trouble relaxing 0 0  Restless 0 0  Easily annoyed or irritable 0 0  Afraid - awful might happen 0 0  Total GAD 7 Score 0 0      Stress Current stressors:   Sleep:   Appetite:   Coping ability:   Patient taking medications as prescribed:    Current medications:  Outpatient Encounter Medications as of 08/02/2017  Medication Sig  . beta carotene w/minerals (OCUVITE) tablet Take 1 tablet by mouth daily.  Marland Kitchen buPROPion (WELLBUTRIN SR) 150 MG 12 hr tablet TAKE ONE TABLET BY MOUTH TWICE DAILY (Patient taking differently: TAKE ONE TABLET BY MOUTH ONCE DAILY IN THE MORNING.)  . Calcium Carbonate-Vitamin D (CALTRATE 600+D PO) Take 1 tablet by mouth daily.  . Carboxymethylcellulose Sodium (THERATEARS) 0.25 % SOLN Place 1 drop into both eyes daily.  . chlorthalidone (HYGROTON) 25 MG tablet TAKE ONE (1) TABLET EACH DAY  . cholecalciferol (VITAMIN D) 1000 UNITS tablet Take 1,000 Units by mouth daily. 25 mcg  . clotrimazole (MYCELEX) 10 MG troche DISSOLVE 1 TABLET IN MOUTH 5 TIMES DAILY  . Glucosamine-Chondroitin (MOVE FREE PO) Take 2 tablets by mouth daily.  Marland Kitchen  ipratropium (ATROVENT) 0.03 % nasal spray USE TWICE DAILY AS DIRECTED  . levocetirizine (XYZAL) 5 MG tablet Take 5 mg by mouth daily as needed for allergies.  Marland Kitchen loperamide (IMODIUM) 2 MG capsule Take 1 capsule (2 mg total) by mouth daily before breakfast.  . meloxicam (MOBIC) 7.5 MG tablet TAKE ONE (1) TABLET EACH DAY (Patient taking differently: TAKE ONE (1) TABLET DAILY AS NEEDED FOR BACK PAIN.)  . Multiple Vitamin (MULTIVITAMIN WITH MINERALS) TABS tablet Take 1 tablet by mouth daily. Centrum Silver  . omeprazole (PRILOSEC) 20 MG capsule TAKE ONE (1) CAPSULE EACH DAY (Patient taking differently: TAKE ONE (1) CAPSULE DAILY IN THE MORNING.)  . TRAVEL SICKNESS 25 MG CHEW TAKE ONE TABLET 3 TIMES A DAY AS NEEDED. (Patient taking differently: TAKE ONE TABLET 3 TIMES A DAY AS NEEDED FOR DIZZINESS.)  . traZODone (DESYREL) 150 MG tablet TAKE ONE TABLET DAILY AT BEDTIME   No facility-administered encounter medications on file as of 08/02/2017.      Self-harm Behaviors Risk Assessment Self-harm risk factors:   Patient endorses recent thoughts of harming self:    [place] Suicide Severity Rating Scale: No flowsheet data found. No flowsheet data found.   Danger to Others Risk Assessment Danger  to others risk factors:   Patient endorses recent thoughts of harming others:    Dynamic Appraisal of Situational Aggression (DASA): No flowsheet data found.   Goals, Interventions and Follow-up Plan Goals: Decrease depression as evidenced by a reduction in the PHQ9 and GD7 score.   Interventions: Motivational interviewing, active listening  Follow-up Plan: Patient reports that she has plan on going to the local [employer] in town in order to work on some art projects.    Summary:  Patient reports decreased depression.  Patient reports that she has still be feeling dizzy but she does not know if it is associated with her sinus issues or the medication.  Patient reports that she has been taking Wellbutrin  for more than 20 years and thinks that she may need to change because she has taken the medication for so long.  Patient report that the medication has been working fine for her.    Patient denies any side effect with the medication.  Patient reports that she was not able to follow up and go outside for a walk as a means to decrease depression due to the cold weather.    Phillip HealStevenson, Kyndell Zeiser LaVerne, LCAS-A

## 2017-08-03 ENCOUNTER — Encounter (HOSPITAL_COMMUNITY): Payer: Self-pay | Admitting: Psychiatry

## 2017-08-03 NOTE — Progress Notes (Signed)
[facility] ([facility]) Psychiatric Consultant Case Review, Follow up note  Summary [name] is a 77 y.o. year old female with history of depression, hypertension, chronic diarrhea with collagenous colitis. Patient complains of dizziness. She wonders if she needs to change her medication (Wellbutrin) as she has been on this for more than 20 years. She denies any side effect. She denies significant neurovegetative symptoms. PHQ9 score is decreased.   Functional Impairment: n/a Psychosocial factors:loneliness, medical issues with hypertension, dizziness  Current Medications Current Outpatient Medications on File Prior to Visit  Medication Sig Dispense Refill  . beta carotene w/minerals (OCUVITE) tablet Take 1 tablet by mouth daily.    Marland Kitchen. buPROPion (WELLBUTRIN SR) 150 MG 12 hr tablet TAKE ONE TABLET BY MOUTH TWICE DAILY (Patient taking differently: TAKE ONE TABLET BY MOUTH ONCE DAILY IN THE MORNING.) 180 tablet 0  . Calcium Carbonate-Vitamin D (CALTRATE 600+D PO) Take 1 tablet by mouth daily.    . Carboxymethylcellulose Sodium (THERATEARS) 0.25 % SOLN Place 1 drop into both eyes daily.    . chlorthalidone (HYGROTON) 25 MG tablet TAKE ONE (1) TABLET EACH DAY 90 tablet 1  . cholecalciferol (VITAMIN D) 1000 UNITS tablet Take 1,000 Units by mouth daily. 25 mcg    . clotrimazole (MYCELEX) 10 MG troche DISSOLVE 1 TABLET IN MOUTH 5 TIMES DAILY 35 lozenge 0  . Glucosamine-Chondroitin (MOVE FREE PO) Take 2 tablets by mouth daily.    Marland Kitchen. ipratropium (ATROVENT) 0.03 % nasal spray USE TWICE DAILY AS DIRECTED 30 mL 3  . levocetirizine (XYZAL) 5 MG tablet Take 5 mg by mouth daily as needed for allergies.    Marland Kitchen. loperamide (IMODIUM) 2 MG capsule Take 1 capsule (2 mg total) by mouth daily before breakfast. 30 capsule   . meloxicam (MOBIC) 7.5 MG tablet TAKE ONE (1) TABLET EACH DAY (Patient taking differently: TAKE ONE (1) TABLET DAILY AS NEEDED FOR BACK PAIN.) 90 tablet 0  . Multiple Vitamin  (MULTIVITAMIN WITH MINERALS) TABS tablet Take 1 tablet by mouth daily. Centrum Silver    . omeprazole (PRILOSEC) 20 MG capsule TAKE ONE (1) CAPSULE EACH DAY (Patient taking differently: TAKE ONE (1) CAPSULE DAILY IN THE MORNING.) 90 capsule 0  . TRAVEL SICKNESS 25 MG CHEW TAKE ONE TABLET 3 TIMES A DAY AS NEEDED. (Patient taking differently: TAKE ONE TABLET 3 TIMES A DAY AS NEEDED FOR DIZZINESS.) 30 each 3  . traZODone (DESYREL) 150 MG tablet TAKE ONE TABLET DAILY AT BEDTIME 90 tablet 1   No current facility-administered medications on file prior to visit.      Past psychiatry history I have reviewed the patient's psychiatry history in detail and updated the patient record. Outpatient: diagnosed with depression for 20 years Psychiatry admission: denies Previous suicide attempt: denies Past trials of medication: fluoxetine (nausea), wellburin History of violence: denies    [facility]/2017 07/12/2017 07/11/2017 06/10/2017 06/06/2017  Decreased Interest 2 3 3  0 0  Down, Depressed, Hopeless 2 2 3 1 3   PHQ - 2 Score 4 5 6 1 3   Altered sleeping 1 0 3 - 1  Tired, decreased energy 2 3 3  - 3  Change in appetite 0 0 0 - 1  Feeling bad or failure about yourself  1 3 3  - 3  Trouble concentrating 1 1 3  - 3  Moving slowly or fidgety/restless 1 2 0 - 1  Suicidal thoughts 0 0 0 - 0  PHQ-9 Score 10 14 18  -  15  Difficult doing work/chores - - - - -   GAD 7 : Generalized Anxiety Score 08/02/2017 07/12/2017  Nervous, Anxious, on Edge 0 0  Control/stop worrying 0 0  Worry too much - different things 0 0  Trouble relaxing 0 0  Restless 0 0  Easily annoyed or irritable 0 0  Afraid - awful might happen 0 0  Total GAD 7 Score 0 0    Goals (patient centered) Decrease depression, work on behavioral activation  Assessment/Provisional Diagnosis # MDD, moderate, recurrent without psychotic features Patient tolerates well to Wellbutrin and there has been improvement in  PHQ score. No medication change required at this time unless patient experiences any adverse reaction. May consider Wellbutrin uptitration in the future if any worsening in depression.   Recommendation  - Continue current Wellbutrin dose.  - If any worsening in depression, consider uptitration of Wellbutrin SR to 150 mg BID (per record, patient takes 150 mg SR daily. If patient already takes 150 mg BID, then uptitrate Wellbutrin SR to 200 mg BID if no history of seizure or headache.) - BH specialist to work on behavioral activation  Thank you for your consult. We will continue to follow the patient. Please contact [facility]  for any questions or concerns.   The above treatment considerations and suggestions are based on consultation with the [facility] specialist and/or PCP and a review of information available in the shared registry and the patient's Electronic Health Record (EHR). I have not personally examined the patient. All recommendations should be implemented with consideration of the patient's relevant prior history and current clinical status. Please feel free to call me with any questions about the care of this patient.

## 2017-08-03 NOTE — Telephone Encounter (Signed)
This encounter was created in error - please disregard.

## 2017-08-16 ENCOUNTER — Ambulatory Visit (INDEPENDENT_AMBULATORY_CARE_PROVIDER_SITE_OTHER): Payer: Medicare Other | Admitting: Internal Medicine

## 2017-08-16 ENCOUNTER — Encounter (INDEPENDENT_AMBULATORY_CARE_PROVIDER_SITE_OTHER): Payer: Self-pay | Admitting: Internal Medicine

## 2017-08-16 VITALS — BP 140/90 | HR 64 | Temp 97.4°F | Resp 18 | Ht 65.5 in | Wt 136.2 lb

## 2017-08-16 DIAGNOSIS — K52831 Collagenous colitis: Secondary | ICD-10-CM

## 2017-08-16 MED ORDER — BUDESONIDE ER 9 MG PO TB24: 9.0000 mg | ORAL_TABLET | Freq: Every day | ORAL | 0 refills | Status: DC

## 2017-08-16 MED ORDER — BUDESONIDE ER 9 MG PO TB24: 9.0000 mg | ORAL_TABLET | Freq: Every day | ORAL | 0 refills | Status: DC | PRN

## 2017-08-16 NOTE — Progress Notes (Signed)
Presenting complaint;  Follow-up for collagenous colitis/diarrhea.  Database and subjective:  Patient is 77 year old Caucasian female who was evaluated for chronic diarrhea last year.  She underwent colonoscopy in July 2018.  There was no evidence of endoscopic colitis but biopsies confirmed collagenous colitis.  She was treated with budesonide with complete resolution of her diarrhea.  She says she has few pills left over. She says she has done well.  She did have a flareup a few weeks ago when she had diarrhea and accidents 3 nights in a row.  Now she is back to having regular bowel movements.  She denies abdominal pain melena or rectal bleeding.  She has days when she does not have a bowel movement.  She does not have any side effects or concerns with using Imodium every day.  Her appetite is good.  Her weight has been stable.   Current Medications: Outpatient Encounter Medications as of 08/16/2017  Medication Sig  . beta carotene w/minerals (OCUVITE) tablet Take 1 tablet by mouth daily.  Marland Kitchen buPROPion (WELLBUTRIN SR) 150 MG 12 hr tablet TAKE ONE TABLET BY MOUTH TWICE DAILY (Patient taking differently: TAKE ONE TABLET BY MOUTH ONCE DAILY IN THE MORNING.)  . Calcium Carbonate-Vitamin D (CALTRATE 600+D PO) Take 1 tablet by mouth daily.  . Carboxymethylcellulose Sodium (THERATEARS) 0.25 % SOLN Place 1 drop into both eyes daily.  . chlorthalidone (HYGROTON) 25 MG tablet TAKE ONE (1) TABLET EACH DAY  . cholecalciferol (VITAMIN D) 1000 UNITS tablet Take 1,000 Units by mouth daily. 25 mcg  . clotrimazole (MYCELEX) 10 MG troche DISSOLVE 1 TABLET IN MOUTH 5 TIMES DAILY  . Glucosamine-Chondroitin (MOVE FREE PO) Take 2 tablets by mouth daily.  Marland Kitchen ipratropium (ATROVENT) 0.03 % nasal spray USE TWICE DAILY AS DIRECTED  . levocetirizine (XYZAL) 5 MG tablet Take 5 mg by mouth daily as needed for allergies.  Marland Kitchen loperamide (IMODIUM) 2 MG capsule Take 1 capsule (2 mg total) by mouth daily before breakfast.  .  meloxicam (MOBIC) 7.5 MG tablet TAKE ONE (1) TABLET EACH DAY (Patient taking differently: TAKE ONE (1) TABLET DAILY AS NEEDED FOR BACK PAIN.)  . Multiple Vitamin (MULTIVITAMIN WITH MINERALS) TABS tablet Take 1 tablet by mouth daily. Centrum Silver  . omeprazole (PRILOSEC) 20 MG capsule TAKE ONE (1) CAPSULE EACH DAY (Patient taking differently: TAKE ONE (1) CAPSULE DAILY IN THE MORNING.)  . TRAVEL SICKNESS 25 MG CHEW TAKE ONE TABLET 3 TIMES A DAY AS NEEDED. (Patient taking differently: TAKE ONE TABLET 3 TIMES A DAY AS NEEDED FOR DIZZINESS.)  . traZODone (DESYREL) 150 MG tablet TAKE ONE TABLET DAILY AT BEDTIME   No facility-administered encounter medications on file as of 08/16/2017.      Objective: Blood pressure 140/90, pulse 64, temperature (!) 97.4 F (36.3 C), temperature source Oral, resp. rate 18, height 5' 5.5" (1.664 m), weight 136 lb 3.2 oz (61.8 kg). Patient is alert and in no acute distress. Conjunctiva is pink. Sclera is nonicteric Oropharyngeal mucosa is normal. No neck masses or thyromegaly noted. Cardiac exam with regular rhythm normal S1 and S2. No murmur or gallop noted. Lungs are clear to auscultation. Abdomen is symmetrical.  Bowel sounds are normal.  On palpation abdomen is soft and nontender without organomegaly or masses. No LE edema or clubbing noted.   Assessment:  #1.  Collagenous colitis.  She may have had mild relapse recently but she is back to having formed stools.  She is on low-dose Imodium OTC daily.  If she  has relapse in future we will treat her with Uceris.   Plan:  Patient will continue loperamide OTC 2 mg p.o. every morning. If diarrhea relapses she will take Uceris 9 mg daily for 2-4 weeks.  Patient was given samples for 4 weeks. Office visit on as-needed basis.

## 2017-08-16 NOTE — Patient Instructions (Addendum)
Notify if diarrhea relapses and not controlled with Uceris and  Imodium OTC.

## 2017-08-17 ENCOUNTER — Telehealth: Payer: Self-pay

## 2017-08-17 NOTE — Telephone Encounter (Signed)
[facility] - left message.  

## 2017-08-25 ENCOUNTER — Other Ambulatory Visit: Payer: Self-pay | Admitting: Family Medicine

## 2017-08-26 NOTE — Telephone Encounter (Signed)
Last seebn 05/10/17  Dr Ermalinda MemosBradshaw

## 2017-09-08 ENCOUNTER — Other Ambulatory Visit: Payer: Self-pay | Admitting: Family

## 2017-09-08 ENCOUNTER — Other Ambulatory Visit: Payer: Self-pay | Admitting: Family Medicine

## 2017-09-19 ENCOUNTER — Ambulatory Visit (INDEPENDENT_AMBULATORY_CARE_PROVIDER_SITE_OTHER): Payer: Medicare Other | Admitting: Otolaryngology

## 2017-09-19 DIAGNOSIS — J343 Hypertrophy of nasal turbinates: Secondary | ICD-10-CM

## 2017-09-19 DIAGNOSIS — J310 Chronic rhinitis: Secondary | ICD-10-CM | POA: Diagnosis not present

## 2017-10-25 ENCOUNTER — Other Ambulatory Visit: Payer: Self-pay | Admitting: Family Medicine

## 2017-10-27 DIAGNOSIS — H35363 Drusen (degenerative) of macula, bilateral: Secondary | ICD-10-CM | POA: Diagnosis not present

## 2017-10-27 DIAGNOSIS — H04123 Dry eye syndrome of bilateral lacrimal glands: Secondary | ICD-10-CM | POA: Diagnosis not present

## 2017-10-27 DIAGNOSIS — H25813 Combined forms of age-related cataract, bilateral: Secondary | ICD-10-CM | POA: Diagnosis not present

## 2017-11-10 ENCOUNTER — Ambulatory Visit (INDEPENDENT_AMBULATORY_CARE_PROVIDER_SITE_OTHER): Payer: Medicare Other | Admitting: Family Medicine

## 2017-11-10 ENCOUNTER — Ambulatory Visit (INDEPENDENT_AMBULATORY_CARE_PROVIDER_SITE_OTHER): Payer: Medicare Other

## 2017-11-10 ENCOUNTER — Encounter: Payer: Self-pay | Admitting: Family Medicine

## 2017-11-10 ENCOUNTER — Telehealth: Payer: Self-pay | Admitting: Family Medicine

## 2017-11-10 VITALS — BP 135/66 | HR 86 | Temp 98.0°F | Ht 65.5 in | Wt 135.6 lb

## 2017-11-10 DIAGNOSIS — R05 Cough: Secondary | ICD-10-CM | POA: Diagnosis not present

## 2017-11-10 DIAGNOSIS — R053 Chronic cough: Secondary | ICD-10-CM

## 2017-11-10 MED ORDER — ALBUTEROL SULFATE HFA 108 (90 BASE) MCG/ACT IN AERS
2.0000 | INHALATION_SPRAY | Freq: Four times a day (QID) | RESPIRATORY_TRACT | 0 refills | Status: DC | PRN
Start: 2017-11-10 — End: 2018-12-23

## 2017-11-10 MED ORDER — METHYLPREDNISOLONE ACETATE 80 MG/ML IJ SUSP
80.0000 mg | Freq: Once | INTRAMUSCULAR | Status: AC
  Administered 2017-11-10: 80 mg via INTRAMUSCULAR

## 2017-11-10 MED ORDER — AZITHROMYCIN 250 MG PO TABS: ORAL_TABLET | ORAL | 0 refills | Status: DC

## 2017-11-10 NOTE — Patient Instructions (Signed)
Great to see you!  Use albuterol inhaler as needed every 4-6 hours for cough.  Start azithromycin today.  We will call and let you know of the results of your chest x-ray

## 2017-11-10 NOTE — Progress Notes (Signed)
   HPI  Patient presents today cough.  Patient explains that she has a chronic cough, she states she has a lingering cough that usually improves in the summer.  Of the last few days she has had worsening cough, productive of thick white sputum, shortness of breath.  She has had improvement with albuterol previously and been told she had asthma previously.  Patient states that she usually gets better with Hycodan cough syrup and antibiotics.  She denies fever, chills, sweats. She states that she coughs so much yesterday she had chest pain.  PMH: Smoking status noted ROS: Per HPI  Objective: BP 135/66   Pulse 86   Temp 98 F (36.7 C) (Oral)   Ht 5' 5.5" (1.664 m)   Wt 135 lb 9.6 oz (61.5 kg)   SpO2 99%   BMI 22.22 kg/m  Gen: NAD, alert, cooperative with exam HEENT: NCAT CV: RRR, good S1/S2, no murmur Resp: CTABL, no wheezes, non-labored Ext: No edema, warm Neuro: Alert and oriented, No gross deficits  Assessment and plan:  #Chronic cough, acute cough on chronic Possible underlying asthma Considering this I gave her IM Depo-Medrol as well as azithromycin Plain film to rule out true lobar pneumonia Declined her request for Hycodan cough syrup Albuterol also given. She likely also has some postnasal drip contributing to her chronic cough, she does well with Atrovent nasal spray, no change to that.   Orders Placed This Encounter  Procedures  . DG Chest 2 View    Standing Status:   Future    Number of Occurrences:   1    Standing Expiration Date:   01/11/2019    Order Specific Question:   Reason for Exam (SYMPTOM  OR DIAGNOSIS REQUIRED)    Answer:   cough, evla for CAP    Order Specific Question:   Preferred imaging location?    Answer:   Internal    Order Specific Question:   Radiology Contrast Protocol - do NOT remove file path    Answer:   [email or web address]    Meds ordered this encounter  Medications  . albuterol  (PROVENTIL HFA;VENTOLIN HFA) 108 (90 Base) MCG/ACT inhaler    Sig: Inhale 2 puffs into the lungs every 6 (six) hours as needed for wheezing or shortness of breath.    Dispense:  1 Inhaler    Refill:  0  . azithromycin (ZITHROMAX) 250 MG tablet    Sig: Take 2 tablets on day 1 and 1 tablet daily after that    Dispense:  6 tablet    Refill:  0  . methylPREDNISolone acetate (DEPO-MEDROL) injection 80 mg    Murtis SinkSam Dilan Fullenwider, MD [facility] 11/10/2017, 11:27 AM

## 2017-11-10 NOTE — Telephone Encounter (Signed)
Pt aware of result.

## 2017-11-14 ENCOUNTER — Telehealth: Payer: Self-pay

## 2017-11-14 NOTE — Telephone Encounter (Signed)
[facility] - Writer left a voice mail message

## 2017-11-25 ENCOUNTER — Telehealth: Payer: Self-pay | Admitting: Clinical

## 2017-11-25 NOTE — Telephone Encounter (Signed)
This BH intern left message to call back with name and contact information. ° °Sudheera Ranaweera, M.A. °Behavioral Health Intern  °

## 2017-11-28 DIAGNOSIS — J40 Bronchitis, not specified as acute or chronic: Secondary | ICD-10-CM | POA: Diagnosis not present

## 2017-12-03 ENCOUNTER — Other Ambulatory Visit: Payer: Self-pay | Admitting: Family Medicine

## 2017-12-05 ENCOUNTER — Ambulatory Visit (INDEPENDENT_AMBULATORY_CARE_PROVIDER_SITE_OTHER): Payer: Medicare Other | Admitting: Internal Medicine

## 2017-12-05 ENCOUNTER — Encounter: Payer: Self-pay | Admitting: Family Medicine

## 2017-12-05 ENCOUNTER — Ambulatory Visit (INDEPENDENT_AMBULATORY_CARE_PROVIDER_SITE_OTHER): Payer: Medicare Other | Admitting: Family Medicine

## 2017-12-05 ENCOUNTER — Ambulatory Visit (INDEPENDENT_AMBULATORY_CARE_PROVIDER_SITE_OTHER): Payer: Medicare Other

## 2017-12-05 VITALS — BP 139/74 | HR 78 | Temp 98.5°F | Ht 65.5 in | Wt 135.0 lb

## 2017-12-05 DIAGNOSIS — R0602 Shortness of breath: Secondary | ICD-10-CM

## 2017-12-05 DIAGNOSIS — R0989 Other specified symptoms and signs involving the circulatory and respiratory systems: Secondary | ICD-10-CM

## 2017-12-05 DIAGNOSIS — R0600 Dyspnea, unspecified: Secondary | ICD-10-CM | POA: Diagnosis not present

## 2017-12-05 MED ORDER — MECLIZINE HCL 25 MG PO CHEW: CHEWABLE_TABLET | ORAL | 0 refills | Status: DC

## 2017-12-05 MED ORDER — IPRATROPIUM BROMIDE 0.03 % NA SOLN
NASAL | 3 refills | Status: DC
Start: 2017-12-05 — End: 2018-04-18

## 2017-12-05 NOTE — Progress Notes (Signed)
BP 139/74   Pulse 78   Temp 98.5 F (36.9 C) (Oral)   Ht 5' 5.5" (1.664 m)   Wt 135 lb (61.2 kg)   SpO2 99%   BMI 22.12 kg/m    Subjective:    Patient ID: Pamela Hughes, female    DOB: 02/19/1941, 77 y.o.   MRN: [id number]  HPI: Pamela Hughes is a 77 y.o. female presenting on 12/05/2017 for Shortness of breath with or without exertion and cough (patient reports this is ongoing for quite a while ) and Nurse from [facility] reports bruit to right carotid   HPI Shortness of breath on exertion and cough Patient comes in today complaining of shortness of breath on exertion that has been worsening over the past couple years.  She says she has a cough that is dry nonproductive that is also worse at night and comes in waves but is worse at times and not worse at others.  Patient denies ever being a smoker.  She does say that she worked in the [employer] for 6 years and had a husband who smoked for 10 years.  She denies any chest pain but just feels short of breath when she walks even short distances like walking from her car to our exam room today.  She was seen by a nurse from her insurance company who also said she had a carotid bruit on the right side and recommended coming in for testing.  She denies any fevers or chills, she does say that she gets occasional wheezing and has been using Atrovent for this but does not think it has been helping much.  She also takes an allergy medication but also feels like is not been helping much.  She denies any swelling or significant weight gain.  Relevant past medical, surgical, family and social history reviewed and updated as indicated. Interim medical history since our last visit reviewed. Allergies and medications reviewed and updated.  Review of Systems  Constitutional: Negative for chills and fever.  HENT: Negative for congestion, ear discharge, ear pain, sinus pressure, sinus pain, sneezing and sore throat.   Eyes: Negative for redness  and visual disturbance.  Respiratory: Positive for cough, shortness of breath and wheezing. Negative for chest tightness.   Cardiovascular: Negative for chest pain, palpitations and leg swelling.  Gastrointestinal: Negative for abdominal pain.  Genitourinary: Negative for difficulty urinating, dysuria and hematuria.  Musculoskeletal: Negative for back pain and gait problem.  Skin: Negative for rash.  Neurological: Negative for light-headedness and headaches.  Psychiatric/Behavioral: Negative for agitation and behavioral problems.  All other systems reviewed and are negative.   Per HPI unless specifically indicated above   Allergies as of 12/05/2017   No Known Allergies     Medication List        Accurate as of 12/05/17 10:15 AM. Always use your most recent med list.          albuterol 108 (90 Base) MCG/ACT inhaler Commonly known as:  PROVENTIL HFA;VENTOLIN HFA Inhale 2 puffs into the lungs every 6 (six) hours as needed for wheezing or shortness of breath.   beta carotene w/minerals tablet Take 1 tablet by mouth daily.   buPROPion 150 MG 12 hr tablet Commonly known as:  WELLBUTRIN SR TAKE ONE TABLET BY MOUTH TWICE DAILY   CALTRATE 600+D PO Take 1 tablet by mouth daily.   chlorthalidone 25 MG tablet Commonly known as:  HYGROTON TAKE ONE (1) TABLET EACH DAY  cholecalciferol 1000 units tablet Commonly known as:  VITAMIN D Take 1,000 Units by mouth daily. 25 mcg   clotrimazole 10 MG troche Commonly known as:  MYCELEX DISSOLVE 1 TABLET IN MOUTH 5 TIMES DAILY   ipratropium 0.03 % nasal spray Commonly known as:  ATROVENT USE TWICE DAILY AS DIRECTED   levocetirizine 5 MG tablet Commonly known as:  XYZAL Take 5 mg by mouth daily as needed for allergies.   loperamide 2 MG capsule Commonly known as:  IMODIUM Take 1 capsule (2 mg total) by mouth daily before breakfast.   Meclizine HCl 25 MG Chew TAKE ONE TABLET 3 TIMES A DAY AS NEEDED.   meloxicam 7.5 MG  tablet Commonly known as:  MOBIC TAKE ONE (1) TABLET EACH DAY   MOVE FREE PO Take 2 tablets by mouth daily.   multivitamin with minerals Tabs tablet Take 1 tablet by mouth daily. Centrum Silver   omeprazole 20 MG capsule Commonly known as:  PRILOSEC TAKE ONE (1) CAPSULE EACH DAY   traZODone 150 MG tablet Commonly known as:  DESYREL TAKE ONE TABLET DAILY AT BEDTIME          Objective:    BP 139/74   Pulse 78   Temp 98.5 F (36.9 C) (Oral)   Ht 5' 5.5" (1.664 m)   Wt 135 lb (61.2 kg)   SpO2 99%   BMI 22.12 kg/m   Wt Readings from Last 3 Encounters:  12/05/17 135 lb (61.2 kg)  11/10/17 135 lb 9.6 oz (61.5 kg)  08/16/17 136 lb 3.2 oz (61.8 kg)    Physical Exam  Constitutional: She is oriented to person, place, and time. She appears well-developed and well-nourished. No distress.  Eyes: Conjunctivae are normal.  Neck: Neck supple. Carotid bruit is present (Faint right carotid bruit). No thyromegaly present.  Cardiovascular: Normal rate, regular rhythm, normal heart sounds and intact distal pulses.  No murmur heard. Pulmonary/Chest: Effort normal and breath sounds normal. No respiratory distress. She has no decreased breath sounds. She has no wheezes. She has no rhonchi. She has no rales.  Abdominal: Soft. Bowel sounds are normal. She exhibits no distension. There is no tenderness.  Musculoskeletal: Normal range of motion. She exhibits no edema or tenderness.  Lymphadenopathy:    She has no cervical adenopathy.  Neurological: She is alert and oriented to person, place, and time. Coordination normal.  Skin: Skin is warm and dry. No rash noted. She is not diaphoretic.  Psychiatric: She has a normal mood and affect. Her behavior is normal.  Nursing note and vitals reviewed.   Chest x-ray: Hyperinflated lungs consistent with COPD or emphysema but no other abnormalities noted  Spirometry: FEV1, 1.86, 85% predicted, FEV1- FVC: Actual 77%, 104% predicted    Assessment &  Plan:   Problem List Items Addressed This Visit    None    Visit Diagnoses    Shortness of breath on exertion    -  Primary   Relevant Orders   DG Chest 2 View   ECHOCARDIOGRAM COMPLETE   PR BREATHING CAPACITY TEST   CBC with Differential/Platelet   CMP14+EGFR   PR BREATHING CAPACITY TEST   Bruit of right carotid artery       Faint carotid bruit   Relevant Orders   US Carotid Duplex Bilateral      Chest x-ray did not give [place] any clear answers and spirometry looked fine, will send for echocardiogram and carotid ultrasound to see if it could be cardiac  in nature causing her symptoms. Follow up plan: Return in about 2 weeks (around 12/19/2017), or if symptoms worsen or fail to improve, for Follow-up with PCP on shortness of breath on exertion.  Counseling provided for all of the vaccine components Orders Placed This Encounter  Procedures  . DG Chest 2 View  . US Carotid Duplex Bilateral  . ECHOCARDIOGRAM COMPLETE  . PR BREATHING CAPACITY TEST    Caryl Pina, MD [facility] 12/05/2017, 10:15 AM

## 2017-12-06 ENCOUNTER — Telehealth: Payer: Self-pay

## 2017-12-06 LAB — CMP14+EGFR
ALK PHOS: 85 IU/L (ref 39–117)
ALT: 20 IU/L (ref 0–32)
AST: 23 IU/L (ref 0–40)
Albumin/Globulin Ratio: 1.7 (ref 1.2–2.2)
Albumin: 3.9 g/dL (ref 3.5–4.8)
BUN/Creatinine Ratio: 17 (ref 12–28)
BUN: 22 mg/dL (ref 8–27)
Bilirubin Total: 0.3 mg/dL (ref 0.0–1.2)
CALCIUM: 9.4 mg/dL (ref 8.7–10.3)
CO2: 27 mmol/L (ref 20–29)
CREATININE: 1.32 mg/dL — AB (ref 0.57–1.00)
Chloride: 89 mmol/L — ABNORMAL LOW (ref 96–106)
GFR calc Af Amer: 45 mL/min/{1.73_m2} — ABNORMAL LOW (ref >59)
GFR, EST NON AFRICAN AMERICAN: 39 mL/min/{1.73_m2} — AB (ref >59)
GLOBULIN, TOTAL: 2.3 g/dL (ref 1.5–4.5)
GLUCOSE: 94 mg/dL (ref 65–99)
Potassium: 3.8 mmol/L (ref 3.5–5.2)
Sodium: 132 mmol/L — ABNORMAL LOW (ref 134–144)
TOTAL PROTEIN: 6.2 g/dL (ref 6.0–8.5)

## 2017-12-06 LAB — CBC WITH DIFFERENTIAL/PLATELET
BASOS ABS: 0 10*3/uL (ref 0.0–0.2)
Basos: 0 %
EOS (ABSOLUTE): 0.2 10*3/uL (ref 0.0–0.4)
EOS: 2 %
HEMATOCRIT: 33.4 % — AB (ref 34.0–46.6)
HEMOGLOBIN: 11.8 g/dL (ref 11.1–15.9)
IMMATURE GRANS (ABS): 0 10*3/uL (ref 0.0–0.1)
IMMATURE GRANULOCYTES: 0 %
LYMPHS ABS: 1.6 10*3/uL (ref 0.7–3.1)
LYMPHS: 22 %
MCH: 31.8 pg (ref 26.6–33.0)
MCHC: 35.3 g/dL (ref 31.5–35.7)
MCV: 90 fL (ref 79–97)
MONOCYTES: 13 %
Monocytes Absolute: 1 10*3/uL — ABNORMAL HIGH (ref 0.1–0.9)
NEUTROS PCT: 63 %
Neutrophils Absolute: 4.5 10*3/uL (ref 1.4–7.0)
Platelets: 378 10*3/uL (ref 150–450)
RBC: 3.71 x10E6/uL — ABNORMAL LOW (ref 3.77–5.28)
RDW: 13.9 % (ref 12.3–15.4)
WBC: 7.3 10*3/uL (ref 3.4–10.8)

## 2017-12-06 NOTE — Telephone Encounter (Signed)
Several attempts have been made to contact patient without success. Patient will be placed on the inactive list.  If services are needed again.  Please contact [facility] at [phone].    Information will be routed to the PCP and Dr. Hisada  

## 2017-12-08 DIAGNOSIS — H25813 Combined forms of age-related cataract, bilateral: Secondary | ICD-10-CM | POA: Diagnosis not present

## 2017-12-09 ENCOUNTER — Other Ambulatory Visit: Payer: Self-pay | Admitting: Family Medicine

## 2017-12-12 ENCOUNTER — Ambulatory Visit (HOSPITAL_COMMUNITY)
Admission: RE | Admit: 2017-12-12 | Discharge: 2017-12-12 | Disposition: A | Discharge status: Home / Self Care | Payer: Medicare Other | Source: Ambulatory Visit | Attending: Family Medicine | Admitting: Family Medicine

## 2017-12-12 ENCOUNTER — Other Ambulatory Visit: Payer: Self-pay | Admitting: Family Medicine

## 2017-12-12 DIAGNOSIS — I10 Essential (primary) hypertension: Secondary | ICD-10-CM | POA: Insufficient documentation

## 2017-12-12 DIAGNOSIS — I6523 Occlusion and stenosis of bilateral carotid arteries: Secondary | ICD-10-CM | POA: Insufficient documentation

## 2017-12-12 DIAGNOSIS — R0602 Shortness of breath: Secondary | ICD-10-CM

## 2017-12-12 DIAGNOSIS — R0989 Other specified symptoms and signs involving the circulatory and respiratory systems: Secondary | ICD-10-CM

## 2017-12-12 DIAGNOSIS — I340 Nonrheumatic mitral (valve) insufficiency: Secondary | ICD-10-CM | POA: Diagnosis not present

## 2017-12-12 DIAGNOSIS — I6522 Occlusion and stenosis of left carotid artery: Secondary | ICD-10-CM | POA: Diagnosis not present

## 2017-12-12 NOTE — Progress Notes (Signed)
*  PRELIMINARY RESULTS* Echocardiogram 2D Echocardiogram has been performed.  Stacey DrainWhite, Tyquez Hollibaugh J 12/12/2017, 11:59 AM

## 2017-12-14 ENCOUNTER — Ambulatory Visit (INDEPENDENT_AMBULATORY_CARE_PROVIDER_SITE_OTHER): Payer: Medicare Other | Admitting: Family Medicine

## 2017-12-14 ENCOUNTER — Encounter: Payer: Self-pay | Admitting: Family Medicine

## 2017-12-14 ENCOUNTER — Telehealth: Payer: Self-pay | Admitting: *Deleted

## 2017-12-14 VITALS — BP 138/75 | HR 73 | Temp 98.1°F | Ht 65.5 in | Wt 133.0 lb

## 2017-12-14 DIAGNOSIS — R0602 Shortness of breath: Secondary | ICD-10-CM

## 2017-12-14 DIAGNOSIS — R05 Cough: Secondary | ICD-10-CM | POA: Diagnosis not present

## 2017-12-14 DIAGNOSIS — R053 Chronic cough: Secondary | ICD-10-CM

## 2017-12-14 MED ORDER — HYDROCODONE-HOMATROPINE 5-1.5 MG/5ML PO SYRP: 5.0000 mL | ORAL_SOLUTION | Freq: Four times a day (QID) | ORAL | 0 refills | Status: DC | PRN

## 2017-12-14 NOTE — Progress Notes (Signed)
BP 138/75   Pulse 73   Temp 98.1 F (36.7 C) (Oral)   Ht 5' 5.5" (1.664 m)   Wt 133 lb (60.3 kg)   SpO2 97%   BMI 21.80 kg/m    Subjective:    Patient ID: Pamela Hughes, female    DOB: 21-Nov-1940, 77 y.o.   MRN: [id number]  HPI: Pamela Hughes is a 77 y.o. female presenting on 12/14/2017 for Shortness of breath/cough (cataract surgery scheduled for 01/13/18 and she is concerned about the cough causing problems during the procedure, would like to know if you can give her something to control the cough)   HPI Chronic cough and mild shortness of breath that is chronic Patient comes in complaining of a chronic dry cough with mild shortness of breath while she is having coughing episodes.  She has been having issues with this for a long time but she is mainly concerned right now because she is about to go in for an ophthalmology surgery and she does not want to have a coughing spell while she is there is that she is wondering if she can get something for cough.  She says her cough is dry and nonproductive and is not necessarily that bothersome except for some time she will get a little bit of a coughing spell.  Patient has albuterol and Atrovent inhalers and she is been using them and they do help some but she has not had to use them too often.  Relevant past medical, surgical, family and social history reviewed and updated as indicated. Interim medical history since our last visit reviewed. Allergies and medications reviewed and updated.  Review of Systems  Constitutional: Negative for chills and fever.  HENT: Negative for congestion, ear discharge, ear pain, rhinorrhea, sinus pressure and sinus pain.   Eyes: Negative for redness and visual disturbance.  Respiratory: Positive for cough and shortness of breath. Negative for chest tightness and wheezing.   Cardiovascular: Negative for chest pain and leg swelling.  Genitourinary: Negative for difficulty urinating and dysuria.    Musculoskeletal: Negative for back pain and gait problem.  Skin: Negative for rash.  Neurological: Negative for light-headedness and headaches.  Psychiatric/Behavioral: Negative for agitation and behavioral problems.  All other systems reviewed and are negative.   Per HPI unless specifically indicated above   Allergies as of 12/14/2017   No Known Allergies     Medication List        Accurate as of 12/14/17 11:59 PM. Always use your most recent med list.          albuterol 108 (90 Base) MCG/ACT inhaler Commonly known as:  PROVENTIL HFA;VENTOLIN HFA Inhale 2 puffs into the lungs every 6 (six) hours as needed for wheezing or shortness of breath.   beta carotene w/minerals tablet Take 1 tablet by mouth daily.   buPROPion 150 MG 12 hr tablet Commonly known as:  WELLBUTRIN SR TAKE ONE TABLET BY MOUTH TWICE DAILY   CALTRATE 600+D PO Take 1 tablet by mouth daily.   chlorthalidone 25 MG tablet Commonly known as:  HYGROTON TAKE ONE (1) TABLET EACH DAY   cholecalciferol 1000 units tablet Commonly known as:  VITAMIN D Take 1,000 Units by mouth daily. 25 mcg   clotrimazole 10 MG troche Commonly known as:  MYCELEX DISSOLVE 1 TABLET IN MOUTH 5 TIMES DAILY   HYDROcodone-homatropine 5-1.5 MG/5ML syrup Commonly known as:  HYCODAN Take 5 mLs by mouth every 6 (six) hours as needed for cough.  ipratropium 0.03 % nasal spray Commonly known as:  ATROVENT USE TWICE DAILY AS DIRECTED   levocetirizine 5 MG tablet Commonly known as:  XYZAL Take 5 mg by mouth daily as needed for allergies.   loperamide 2 MG capsule Commonly known as:  IMODIUM Take 1 capsule (2 mg total) by mouth daily before breakfast.   Meclizine HCl 25 MG Chew TAKE ONE TABLET 3 TIMES A DAY AS NEEDED.   meloxicam 7.5 MG tablet Commonly known as:  MOBIC TAKE ONE (1) TABLET EACH DAY   MOVE FREE PO Take 2 tablets by mouth daily.   multivitamin with minerals Tabs tablet Take 1 tablet by mouth daily.  Centrum Silver   omeprazole 20 MG capsule Commonly known as:  PRILOSEC TAKE ONE (1) CAPSULE EACH DAY   traZODone 150 MG tablet Commonly known as:  DESYREL TAKE ONE TABLET DAILY AT BEDTIME          Objective:    BP 138/75   Pulse 73   Temp 98.1 F (36.7 C) (Oral)   Ht 5' 5.5" (1.664 m)   Wt 133 lb (60.3 kg)   SpO2 97%   BMI 21.80 kg/m   Wt Readings from Last 3 Encounters:  12/14/17 133 lb (60.3 kg)  12/05/17 135 lb (61.2 kg)  11/10/17 135 lb 9.6 oz (61.5 kg)    Physical Exam  Constitutional: She is oriented to person, place, and time. She appears well-developed and well-nourished. No distress.  HENT:  Right Ear: External ear normal.  Left Ear: External ear normal.  Mouth/Throat: Oropharynx is clear and moist. No oropharyngeal exudate.  Eyes: Pupils are equal, round, and reactive to light. Conjunctivae and EOM are normal.  Neck: Neck supple. No thyromegaly present.  Cardiovascular: Normal rate, regular rhythm, normal heart sounds and intact distal pulses. Exam reveals no friction rub.  No murmur heard. Pulmonary/Chest: Effort normal and breath sounds normal. No respiratory distress. She has no wheezes. She has no rales.  Musculoskeletal: Normal range of motion. She exhibits no edema or tenderness.  Lymphadenopathy:    She has no cervical adenopathy.  Neurological: She is alert and oriented to person, place, and time. Coordination normal.  Skin: Skin is warm and dry. No rash noted. She is not diaphoretic.  Psychiatric: She has a normal mood and affect. Her behavior is normal.  Nursing note and vitals reviewed.       Assessment & Plan:   Problem List Items Addressed This Visit    None    Visit Diagnoses    Chronic cough    -  Primary   Relevant Medications   HYDROcodone-homatropine (HYCODAN) 5-1.5 MG/5ML syrup     Gave Hycodan cough syrup to help during her procedure.  Follow up plan: Return if symptoms worsen or fail to improve.  Counseling provided  for all of the vaccine components No orders of the defined types were placed in this encounter.   Arville CareJoshua Nimco Bivens, MD [facility] 12/16/2017, 12:53 PM

## 2017-12-14 NOTE — Telephone Encounter (Signed)
Aware of lab and Echocardiogram results.  Referral to cardiology in process.

## 2017-12-19 ENCOUNTER — Ambulatory Visit: Payer: Medicare Other | Admitting: Family Medicine

## 2018-01-04 NOTE — Patient Instructions (Signed)
Your procedure is scheduled on: 01/13/2018   Report to [facility] at  615   AM.  Call this number if you have problems the morning of surgery: [phone]   Do not eat food or drink liquids :After Midnight.      Take these medicines the morning of surgery with A SIP OF WATER: wellbutrin, zyrtec, hygroton, meclizine (if needed), mobic, (if needed), prilosec. Use your inhaler before you come.   Do not wear jewelry, make-up or nail polish.  Do not wear lotions, powders, or perfumes. You may wear deodorant.  Do not shave 48 hours prior to surgery.  Do not bring valuables to the hospital.  Contacts, dentures or bridgework may not be worn into surgery.  Leave suitcase in the car. After surgery it may be brought to your room.  For patients admitted to the hospital, checkout time is 11:00 AM the day of discharge.   Patients discharged the day of surgery will not be allowed to drive home.  :     Please read over the following fact sheets that you were given: Coughing and Deep Breathing, Surgical Site Infection Prevention, Anesthesia Post-op Instructions and Care and Recovery After Surgery    Cataract A cataract is a clouding of the lens of the eye. When a lens becomes cloudy, vision is reduced based on the degree and nature of the clouding. Many cataracts reduce vision to some degree. Some cataracts make people more near-sighted as they develop. Other cataracts increase glare. Cataracts that are ignored and become worse can sometimes look white. The white color can be seen through the pupil. CAUSES   Aging. However, cataracts may occur at any age, even in newborns.   Certain drugs.   Trauma to the eye.   Certain diseases such as diabetes.   Specific eye diseases such as chronic inflammation inside the eye or a sudden attack of a rare form of glaucoma.   Inherited or acquired medical problems.  SYMPTOMS   Gradual, progressive drop in vision in the affected eye.   Severe, rapid visual  loss. This most often happens when trauma is the cause.  DIAGNOSIS  To detect a cataract, an eye doctor examines the lens. Cataracts are best diagnosed with an exam of the eyes with the pupils enlarged (dilated) by drops.  TREATMENT  For an early cataract, vision may improve by using different eyeglasses or stronger lighting. If that does not help your vision, surgery is the only effective treatment. A cataract needs to be surgically removed when vision loss interferes with your everyday activities, such as driving, reading, or watching TV. A cataract may also have to be removed if it prevents examination or treatment of another eye problem. Surgery removes the cloudy lens and usually replaces it with a substitute lens (intraocular lens, IOL).  At a time when both you and your doctor agree, the cataract will be surgically removed. If you have cataracts in both eyes, only one is usually removed at a time. This allows the operated eye to heal and be out of danger from any possible problems after surgery (such as infection or poor wound healing). In rare cases, a cataract may be doing damage to your eye. In these cases, your caregiver may advise surgical removal right away. The vast majority of people who have cataract surgery have better vision afterward. HOME CARE INSTRUCTIONS  If you are not planning surgery, you may be asked to do the following:  Use different eyeglasses.  Use stronger or brighter lighting.   Ask your eye doctor about reducing your medicine dose or changing medicines if it is thought that a medicine caused your cataract. Changing medicines does not make the cataract go away on its own.   Become familiar with your surroundings. Poor vision can lead to injury. Avoid bumping into things on the affected side. You are at a higher risk for tripping or falling.   Exercise extreme care when driving or operating machinery.   Wear sunglasses if you are sensitive to bright light or  experiencing problems with glare.  SEEK IMMEDIATE MEDICAL CARE IF:   You have a worsening or sudden vision loss.   You notice redness, swelling, or increasing pain in the eye.   You have a fever.  Document Released: 05/17/2005 Document Revised: 05/06/2011 Document Reviewed: 01/08/2011 [facility] Patient Information 2012 [place].PATIENT INSTRUCTIONS POST-ANESTHESIA  IMMEDIATELY FOLLOWING SURGERY:  Do not drive or operate machinery for the first twenty four hours after surgery.  Do not make any important decisions for twenty four hours after surgery or while taking narcotic pain medications or sedatives.  If you develop intractable nausea and vomiting or a severe headache please notify your doctor immediately.  FOLLOW-UP:  Please make an appointment with your surgeon as instructed. You do not need to follow up with anesthesia unless specifically instructed to do so.  WOUND CARE INSTRUCTIONS (if applicable):  Keep a dry clean dressing on the anesthesia/puncture wound site if there is drainage.  Once the wound has quit draining you may leave it open to air.  Generally you should leave the bandage intact for twenty four hours unless there is drainage.  If the epidural site drains for more than 36-48 hours please call the anesthesia department.  QUESTIONS?:  Please feel free to call your physician or the hospital operator if you have any questions, and they will be happy to assist you.

## 2018-01-05 ENCOUNTER — Other Ambulatory Visit: Payer: Self-pay

## 2018-01-05 ENCOUNTER — Encounter (HOSPITAL_COMMUNITY): Payer: Self-pay

## 2018-01-05 ENCOUNTER — Ambulatory Visit: Payer: Medicare Other | Admitting: Cardiovascular Disease

## 2018-01-05 ENCOUNTER — Encounter: Payer: Self-pay | Admitting: Cardiovascular Disease

## 2018-01-05 ENCOUNTER — Encounter (HOSPITAL_COMMUNITY)
Admission: RE | Admit: 2018-01-05 | Discharge: 2018-01-05 | Disposition: A | Discharge status: Home / Self Care | Payer: Medicare Other | Source: Ambulatory Visit | Attending: Ophthalmology | Admitting: Ophthalmology

## 2018-01-05 VITALS — BP 181/85 | HR 76 | Ht 65.0 in | Wt 137.0 lb

## 2018-01-05 DIAGNOSIS — Z01810 Encounter for preprocedural cardiovascular examination: Secondary | ICD-10-CM | POA: Diagnosis not present

## 2018-01-05 DIAGNOSIS — E785 Hyperlipidemia, unspecified: Secondary | ICD-10-CM

## 2018-01-05 DIAGNOSIS — H269 Unspecified cataract: Secondary | ICD-10-CM | POA: Insufficient documentation

## 2018-01-05 DIAGNOSIS — I10 Essential (primary) hypertension: Secondary | ICD-10-CM

## 2018-01-05 DIAGNOSIS — R931 Abnormal findings on diagnostic imaging of heart and coronary circulation: Secondary | ICD-10-CM | POA: Diagnosis not present

## 2018-01-05 DIAGNOSIS — I6522 Occlusion and stenosis of left carotid artery: Secondary | ICD-10-CM | POA: Diagnosis not present

## 2018-01-05 HISTORY — DX: Unspecified asthma, uncomplicated: J45.909

## 2018-01-05 HISTORY — DX: Anxiety disorder, unspecified: F41.9

## 2018-01-05 HISTORY — DX: Unspecified hearing loss, unspecified ear: H91.90

## 2018-01-05 HISTORY — DX: Gastro-esophageal reflux disease without esophagitis: K21.9

## 2018-01-05 MED ORDER — ATORVASTATIN CALCIUM 20 MG PO TABS: 20.0000 mg | ORAL_TABLET | Freq: Every day | ORAL | 11 refills | Status: DC

## 2018-01-05 MED ORDER — ASPIRIN EC 81 MG PO TBEC: 81.0000 mg | DELAYED_RELEASE_TABLET | Freq: Every day | ORAL | Status: DC

## 2018-01-05 NOTE — Progress Notes (Signed)
CARDIOLOGY CONSULT NOTE  Patient ID: Pamela Hughes MRN: [id number] DOB/AGE: [birth year] 77 y.o.  Admit date: (Not on file) Primary Physician: Elenora Gamma, MD Referring Physician: Dr. Ermalinda Memos  Reason for Consultation: Abnormal echocardiogram  HPI: Pamela Hughes is a 77 y.o. female who is being seen today for the evaluation of abnormal echocardiogram at the request of Elenora Gamma, MD.   I reviewed carotid Dopplers performed on 12/12/2017 which showed 50 to 69% left internal carotid artery stenosis.  Chest x-ray 12/05/2017: No active cardiopulmonary disease.  I reviewed the echocardiogram performed on 12/12/2017 which showed normal left ventricular systolic function and regional wall motion, LVEF 60 to 65%, mild mitral regurgitation, and increased thickness of the septum consistent with lipomatous hypertrophy.  With CBC 12/05/2017: Hemoglobin 11.8.  Lipids 07/11/2017: Total cholesterol elevated 269, triglycerides 94, HDL elevated 105, LDL 145.  She denies exertional chest pain.  She does have asthma but denies wheezing at present.  She walks 2 miles a day at least 4 to 5 days/week.  She has some mild right sided ankle swelling.  She denies orthopnea and paroxysmal nocturnal dyspnea.  She denies personal history of MI and stroke.  ECG performed in the office today which I ordered and personally interpreted demonstrates normal sinus rhythm with mild nonspecific T wave abnormalities.   No Known Allergies  Current Outpatient Medications  Medication Sig Dispense Refill  . albuterol (PROVENTIL HFA;VENTOLIN HFA) 108 (90 Base) MCG/ACT inhaler Inhale 2 puffs into the lungs every 6 (six) hours as needed for wheezing or shortness of breath. 1 Inhaler 0  . Aspirin-Acetaminophen-Caffeine (GOODY HEADACHE PO) Take 1 packet by mouth daily as needed (pain).    Marland Kitchen beta carotene w/minerals (OCUVITE) tablet Take 1 tablet by mouth daily.    Marland Kitchen buPROPion (WELLBUTRIN SR) 150 MG 12 hr tablet  TAKE ONE TABLET BY MOUTH TWICE DAILY (Patient taking differently: Take 150 mg by mouth once daily) 180 tablet 0  . Calcium Carbonate-Vitamin D (CALCIUM 600+D PO) Take 1 tablet by mouth 2 (two) times daily.    . cetirizine (ZYRTEC) 10 MG tablet Take 10 mg by mouth daily as needed for allergies.    . chlorthalidone (HYGROTON) 25 MG tablet TAKE ONE (1) TABLET EACH DAY 90 tablet 0  . cholecalciferol (VITAMIN D) 1000 UNITS tablet Take 1,000 Units by mouth daily. 25 mcg    . clotrimazole (MYCELEX) 10 MG troche DISSOLVE 1 TABLET IN MOUTH 5 TIMES DAILY (Patient taking differently: DISSOLVE 1 TABLET IN MOUTH 5 TIMES DAILY AS NEEDED FOR YEAST) 35 tablet 1  . Glucosamine-Chondroitin (OSTEO BI-FLEX REGULAR STRENGTH PO) Take 2 tablets by mouth daily.    Marland Kitchen HYDROcodone-homatropine (HYCODAN) 5-1.5 MG/5ML syrup Take 5 mLs by mouth every 6 (six) hours as needed for cough. 120 mL 0  . Hypromellose (ARTIFICIAL TEARS OP) Place 1 drop into both eyes daily as needed (dry eyes).    Marland Kitchen ipratropium (ATROVENT) 0.03 % nasal spray USE TWICE DAILY AS DIRECTED (Patient taking differently: Place 1 spray into both nostrils 2 (two) times daily. ) 30 mL 3  . loperamide (IMODIUM) 2 MG capsule Take 1 capsule (2 mg total) by mouth daily before breakfast. (Patient taking differently: Take 1-2 mg by mouth daily before breakfast. ) 30 capsule   . Meclizine HCl 25 MG CHEW TAKE ONE TABLET 3 TIMES A DAY AS NEEDED. (Patient taking differently: Take 25 mg by mouth 3 times daily as needed for dizziness) 30 each  0  . meloxicam (MOBIC) 7.5 MG tablet TAKE ONE (1) TABLET EACH DAY (Patient taking differently: Take 7.5 mg by mouth once daily as needed for pain) 90 tablet 0  . Multiple Vitamin (MULTIVITAMIN WITH MINERALS) TABS tablet Take 1 tablet by mouth daily. Centrum Silver    . omeprazole (PRILOSEC) 20 MG capsule TAKE ONE (1) CAPSULE EACH DAY 90 capsule 0  . traZODone (DESYREL) 150 MG tablet TAKE ONE TABLET DAILY AT BEDTIME (Patient taking  differently: Take 75 mg by mouth once daily at bedtime) 90 tablet 0   No current facility-administered medications for this visit.     Past Medical History:  Diagnosis Date  . Allergy   . Alopecia   . Anxiety   . Arthritis   . Asthma   . Collagenous colitis   . Depression   . Diarrhea    chronic- "states for at least a year)  . Edema   . GERD (gastroesophageal reflux disease)   . HOH (hard of hearing)   . Hypertension     Past Surgical History:  Procedure Laterality Date  . ABDOMINAL HYSTERECTOMY     one ovary left  . COLONOSCOPY N/A 12/17/2016   Procedure: COLONOSCOPY;  Surgeon: Malissa Hippo, MD;  Location: [facility];  Service: Endoscopy;  Laterality: N/A;  2:05  . SPINE SURGERY  1990s   L spine    Social History   Socioeconomic History  . Marital status: Divorced    Spouse name: Not on file  . Number of children: 1  . Years of education: 22  . Highest education level: 10th grade  Occupational History  . Occupation: Retired    Comment: Air traffic controller  . Financial resource strain: Not very hard  . Food insecurity:    Worry: Never true    Inability: Not on file  . Transportation needs:    Medical: No    Non-medical: No  Tobacco Use  . Smoking status: Former Smoker    Packs/day: 0.25    Years: 5.00    Pack years: 1.25    Types: Cigarettes    Last attempt to quit: 10/09/1974    Years since quitting: 43.2  . Smokeless tobacco: Never Used  Substance and Sexual Activity  . Alcohol use: Yes    Comment: glass of wine occasionally  . Drug use: No  . Sexual activity: Yes    Birth control/protection: Surgical  Lifestyle  . Physical activity:    Days per week: 0 days    Minutes per session: Not on file  . Stress: Not on file  Relationships  . Social connections:    Talks on phone: Three times a week    Gets together: Twice a week    Attends religious service: More than 4 times per year    Active member of club or organization: No     Attends meetings of clubs or organizations: Never    Relationship status: Divorced  . Intimate partner violence:    Fear of current or ex partner: Not on file    Emotionally abused: Not on file    Physically abused: Not on file    Forced sexual activity: Not on file  Other Topics Concern  . Not on file  Social History Narrative  . Not on file     No family history of premature CAD in 1st degree relatives.  Current Meds  Medication Sig  . albuterol (PROVENTIL HFA;VENTOLIN HFA) 108 (90 Base) MCG/ACT inhaler  Inhale 2 puffs into the lungs every 6 (six) hours as needed for wheezing or shortness of breath.  . Aspirin-Acetaminophen-Caffeine (GOODY HEADACHE PO) Take 1 packet by mouth daily as needed (pain).  Marland Kitchen beta carotene w/minerals (OCUVITE) tablet Take 1 tablet by mouth daily.  Marland Kitchen buPROPion (WELLBUTRIN SR) 150 MG 12 hr tablet TAKE ONE TABLET BY MOUTH TWICE DAILY (Patient taking differently: Take 150 mg by mouth once daily)  . Calcium Carbonate-Vitamin D (CALCIUM 600+D PO) Take 1 tablet by mouth 2 (two) times daily.  . cetirizine (ZYRTEC) 10 MG tablet Take 10 mg by mouth daily as needed for allergies.  . chlorthalidone (HYGROTON) 25 MG tablet TAKE ONE (1) TABLET EACH DAY  . cholecalciferol (VITAMIN D) 1000 UNITS tablet Take 1,000 Units by mouth daily. 25 mcg  . clotrimazole (MYCELEX) 10 MG troche DISSOLVE 1 TABLET IN MOUTH 5 TIMES DAILY (Patient taking differently: DISSOLVE 1 TABLET IN MOUTH 5 TIMES DAILY AS NEEDED FOR YEAST)  . Glucosamine-Chondroitin (OSTEO BI-FLEX REGULAR STRENGTH PO) Take 2 tablets by mouth daily.  Marland Kitchen HYDROcodone-homatropine (HYCODAN) 5-1.5 MG/5ML syrup Take 5 mLs by mouth every 6 (six) hours as needed for cough.  . Hypromellose (ARTIFICIAL TEARS OP) Place 1 drop into both eyes daily as needed (dry eyes).  Marland Kitchen ipratropium (ATROVENT) 0.03 % nasal spray USE TWICE DAILY AS DIRECTED (Patient taking differently: Place 1 spray into both nostrils 2 (two) times daily. )  .  loperamide (IMODIUM) 2 MG capsule Take 1 capsule (2 mg total) by mouth daily before breakfast. (Patient taking differently: Take 1-2 mg by mouth daily before breakfast. )  . Meclizine HCl 25 MG CHEW TAKE ONE TABLET 3 TIMES A DAY AS NEEDED. (Patient taking differently: Take 25 mg by mouth 3 times daily as needed for dizziness)  . meloxicam (MOBIC) 7.5 MG tablet TAKE ONE (1) TABLET EACH DAY (Patient taking differently: Take 7.5 mg by mouth once daily as needed for pain)  . Multiple Vitamin (MULTIVITAMIN WITH MINERALS) TABS tablet Take 1 tablet by mouth daily. Centrum Silver  . omeprazole (PRILOSEC) 20 MG capsule TAKE ONE (1) CAPSULE EACH DAY  . traZODone (DESYREL) 150 MG tablet TAKE ONE TABLET DAILY AT BEDTIME (Patient taking differently: Take 75 mg by mouth once daily at bedtime)      Review of systems complete and found to be negative unless listed above in HPI    Physical exam Blood pressure (!) 181/85, pulse 76, height 5\' 5"  (1.651 m), weight 137 lb (62.1 kg), SpO2 96 %. General: NAD Neck: No JVD, no thyromegaly or thyroid nodule.  Lungs: Clear to auscultation bilaterally with normal respiratory effort. CV: Nondisplaced PMI. Regular rate and rhythm, normal S1/S2, no S3/S4, 1/6 systolic murmur loudest over left upper sternal border.  No peripheral edema.  No carotid bruit.   Abdomen: Soft, nontender, no distention.  Skin: Intact without lesions or rashes.  Neurologic: Alert and oriented x 3.  Psych: Normal affect. Extremities: No clubbing or cyanosis.  HEENT: Normal.   ECG: Most recent ECG reviewed.   Labs: Lab Results  Component Value Date/Time   K 3.8 12/05/2017 11:11 AM   BUN 22 12/05/2017 11:11 AM   CREATININE 1.32 (H) 12/05/2017 11:11 AM   ALT 20 12/05/2017 11:11 AM   TSH 1.410 07/11/2017 03:55 PM   HGB 11.8 12/05/2017 11:11 AM     Lipids: Lab Results  Component Value Date/Time   LDLCALC 145 (H) 07/11/2017 03:55 PM   LDLDIRECT 165 (H) 07/11/2017 03:55 PM  CHOL 269  (H) 07/11/2017 03:55 PM   TRIG 94 07/11/2017 03:55 PM   HDL 105 07/11/2017 03:55 PM        ASSESSMENT AND PLAN:  1.  Abnormal echocardiogram: She has lipomatous hypertrophy of the interatrial septum which was the question posed by the PCP.  This can be a common finding in the elderly and portends no adverse cardiovascular outcomes.  2.  Hypertension: Blood pressure is elevated today but she says it is routinely normal.  This will need further monitoring.  3.  Left internal carotid artery stenosis: I will repeat carotid artery Dopplers in 1 year.  I recommend she start aspirin 81 mg daily.  I also recommend she starts low-dose atorvastatin 20 mg daily.  4.  Hypercholesterolemia: Lipids reviewed above.  Given left internal carotid artery stenosis, I will start low-dose atorvastatin 20 mg daily.     Disposition: Follow up in 1 year  Signed: Prentice DockerSuresh Koneswaran, M.D., F.A.C.C.  01/05/2018, 1:57 PM

## 2018-01-05 NOTE — Patient Instructions (Signed)
Medication Instructions:   Begin Aspirin 81mg  daily.   Begin Lipitor 20mg  daily.   Continue all other medications.    Labwork: none  Testing/Procedures: Repeat carotid doppler in 1 year.   Follow-Up: Your physician wants you to follow up in:  1 year.  You will receive a reminder letter in the mail one-two months in advance.  If you don't receive a letter, please call our office to schedule the follow up appointment   Any Other Special Instructions Will Be Listed Below (If Applicable).  If you need a refill on your cardiac medications before your next appointment, please call your pharmacy.

## 2018-01-06 ENCOUNTER — Inpatient Hospital Stay (HOSPITAL_COMMUNITY)
Admission: RE | Admit: 2018-01-06 | Discharge: 2018-01-06 | Disposition: A | Discharge status: Home / Self Care | Payer: Medicare Other | Source: Ambulatory Visit

## 2018-01-06 NOTE — Progress Notes (Signed)
Patient called at 11:48am to state "No one can bring me to the hospital for my surgery by 6:15am on August 16th, 2019.  Verified with Carloyn Jaegerarolyn Young, surgery scheduler could move patient to 9:50am surgery time, arrival at 8:15am on August 16th, 2019.  Patient states she "cannot get here by 8:20am as well due to person bringing her.  Patient advised to Call Dr. Adella HareWrozek's office to let them know that she will need to be rescheduled as the surgery times available patient cannot arrive.  Patient agrees to call Dr. Chalmers CaterWrozsek's office.

## 2018-01-10 ENCOUNTER — Other Ambulatory Visit: Payer: Self-pay | Admitting: *Deleted

## 2018-01-10 MED ORDER — MELOXICAM 7.5 MG PO TABS: ORAL_TABLET | ORAL | 0 refills | Status: DC

## 2018-01-13 ENCOUNTER — Encounter (HOSPITAL_COMMUNITY)
Admission: RE | Disposition: A | Discharge status: Home / Self Care | Payer: Self-pay | Source: Ambulatory Visit | Attending: Ophthalmology

## 2018-01-13 ENCOUNTER — Ambulatory Visit (HOSPITAL_COMMUNITY)
Admission: RE | Admit: 2018-01-13 | Discharge: 2018-01-13 | Disposition: A | Discharge status: Home / Self Care | Payer: Medicare Other | Source: Ambulatory Visit | Attending: Ophthalmology | Admitting: Ophthalmology

## 2018-01-13 SURGERY — PHACOEMULSIFICATION, CATARACT, WITH IOL INSERTION
Anesthesia: Monitor Anesthesia Care | Laterality: Right

## 2018-01-17 ENCOUNTER — Inpatient Hospital Stay (HOSPITAL_COMMUNITY): Admission: RE | Admit: 2018-01-17 | Payer: Medicare Other | Source: Ambulatory Visit

## 2018-01-31 NOTE — Progress Notes (Unsigned)
[facility]  

## 2018-02-03 ENCOUNTER — Other Ambulatory Visit: Payer: Self-pay | Admitting: *Deleted

## 2018-02-06 MED ORDER — TRAZODONE HCL 150 MG PO TABS: 150.0000 mg | ORAL_TABLET | Freq: Every day | ORAL | 0 refills | Status: DC

## 2018-02-06 MED ORDER — CLOTRIMAZOLE 10 MG MT TROC: OROMUCOSAL | 0 refills | Status: DC

## 2018-02-09 DIAGNOSIS — H25811 Combined forms of age-related cataract, right eye: Secondary | ICD-10-CM | POA: Diagnosis not present

## 2018-02-14 ENCOUNTER — Encounter (HOSPITAL_COMMUNITY)
Admission: RE | Admit: 2018-02-14 | Discharge: 2018-02-14 | Disposition: A | Discharge status: Home / Self Care | Payer: Medicare Other | Source: Ambulatory Visit | Attending: Ophthalmology | Admitting: Ophthalmology

## 2018-02-17 ENCOUNTER — Ambulatory Visit (HOSPITAL_COMMUNITY): Payer: Medicare Other | Admitting: Anesthesiology

## 2018-02-17 ENCOUNTER — Encounter (HOSPITAL_COMMUNITY)
Admission: RE | Disposition: A | Discharge status: Home / Self Care | Payer: Self-pay | Source: Ambulatory Visit | Attending: Ophthalmology

## 2018-02-17 ENCOUNTER — Ambulatory Visit (HOSPITAL_COMMUNITY)
Admission: RE | Admit: 2018-02-17 | Discharge: 2018-02-17 | Disposition: A | Discharge status: Home / Self Care | Payer: Medicare Other | Source: Ambulatory Visit | Attending: Ophthalmology | Admitting: Ophthalmology

## 2018-02-17 ENCOUNTER — Encounter (HOSPITAL_COMMUNITY): Payer: Self-pay

## 2018-02-17 DIAGNOSIS — R42 Dizziness and giddiness: Secondary | ICD-10-CM | POA: Insufficient documentation

## 2018-02-17 DIAGNOSIS — F329 Major depressive disorder, single episode, unspecified: Secondary | ICD-10-CM | POA: Diagnosis not present

## 2018-02-17 DIAGNOSIS — H52201 Unspecified astigmatism, right eye: Secondary | ICD-10-CM | POA: Diagnosis not present

## 2018-02-17 DIAGNOSIS — H269 Unspecified cataract: Secondary | ICD-10-CM | POA: Diagnosis not present

## 2018-02-17 DIAGNOSIS — K219 Gastro-esophageal reflux disease without esophagitis: Secondary | ICD-10-CM | POA: Diagnosis not present

## 2018-02-17 DIAGNOSIS — M810 Age-related osteoporosis without current pathological fracture: Secondary | ICD-10-CM | POA: Insufficient documentation

## 2018-02-17 DIAGNOSIS — I10 Essential (primary) hypertension: Secondary | ICD-10-CM | POA: Insufficient documentation

## 2018-02-17 DIAGNOSIS — Z79899 Other long term (current) drug therapy: Secondary | ICD-10-CM | POA: Insufficient documentation

## 2018-02-17 DIAGNOSIS — H2511 Age-related nuclear cataract, right eye: Secondary | ICD-10-CM | POA: Diagnosis not present

## 2018-02-17 DIAGNOSIS — Z87891 Personal history of nicotine dependence: Secondary | ICD-10-CM | POA: Insufficient documentation

## 2018-02-17 DIAGNOSIS — J45909 Unspecified asthma, uncomplicated: Secondary | ICD-10-CM | POA: Diagnosis not present

## 2018-02-17 DIAGNOSIS — H25811 Combined forms of age-related cataract, right eye: Secondary | ICD-10-CM | POA: Diagnosis not present

## 2018-02-17 HISTORY — PX: CATARACT EXTRACTION W/PHACO: SHX586

## 2018-02-17 SURGERY — PHACOEMULSIFICATION, CATARACT, WITH IOL INSERTION
Anesthesia: Monitor Anesthesia Care | Site: Eye | Laterality: Right

## 2018-02-17 MED ORDER — PHENYLEPHRINE HCL 2.5 % OP SOLN
1.0000 [drp] | OPHTHALMIC | Status: AC | PRN
  Administered 2018-02-17 (×3): 1 [drp] via OPHTHALMIC

## 2018-02-17 MED ORDER — LACTATED RINGERS IV SOLN
INTRAVENOUS | Status: DC
  Administered 2018-02-17: 09:00:00 via INTRAVENOUS

## 2018-02-17 MED ORDER — LIDOCAINE HCL (PF) 1 % IJ SOLN
INTRAOCULAR | Status: DC | PRN
  Administered 2018-02-17: .9 mL via OPHTHALMIC

## 2018-02-17 MED ORDER — CYCLOPENTOLATE-PHENYLEPHRINE 0.2-1 % OP SOLN
1.0000 [drp] | OPHTHALMIC | Status: AC
  Administered 2018-02-17 (×3): 1 [drp] via OPHTHALMIC

## 2018-02-17 MED ORDER — NEOMYCIN-POLYMYXIN-DEXAMETH 3.5-10000-0.1 OP SUSP
OPHTHALMIC | Status: DC | PRN
  Administered 2018-02-17: 2 [drp] via OPHTHALMIC

## 2018-02-17 MED ORDER — SODIUM HYALURONATE 23 MG/ML IO SOLN
INTRAOCULAR | Status: DC | PRN
  Administered 2018-02-17: 0.6 mL via INTRAOCULAR

## 2018-02-17 MED ORDER — PROVISC 10 MG/ML IO SOLN
INTRAOCULAR | Status: DC | PRN
  Administered 2018-02-17: 0.85 mL via INTRAOCULAR

## 2018-02-17 MED ORDER — MIDAZOLAM HCL 5 MG/5ML IJ SOLN
INTRAMUSCULAR | Status: DC | PRN
  Administered 2018-02-17: 1 mg via INTRAVENOUS

## 2018-02-17 MED ORDER — LIDOCAINE HCL 3.5 % OP GEL
1.0000 "application " | Freq: Once | OPHTHALMIC | Status: AC
  Administered 2018-02-17: 1 via OPHTHALMIC

## 2018-02-17 MED ORDER — TETRACAINE HCL 0.5 % OP SOLN
1.0000 [drp] | OPHTHALMIC | Status: AC
  Administered 2018-02-17 (×3): 1 [drp] via OPHTHALMIC

## 2018-02-17 MED ORDER — BSS IO SOLN
INTRAOCULAR | Status: DC | PRN
  Administered 2018-02-17: 15 mL via INTRAOCULAR

## 2018-02-17 MED ORDER — EPINEPHRINE PF 1 MG/ML IJ SOLN
INTRAOCULAR | Status: DC | PRN
  Administered 2018-02-17: 500 mL

## 2018-02-17 MED ORDER — POVIDONE-IODINE 5 % OP SOLN
OPHTHALMIC | Status: DC | PRN
  Administered 2018-02-17: 1 via OPHTHALMIC

## 2018-02-17 MED ORDER — MIDAZOLAM HCL 2 MG/2ML IJ SOLN
INTRAMUSCULAR | Status: AC
  Filled 2018-02-17: qty 2

## 2018-02-17 SURGICAL SUPPLY — 20 items
CLOTH BEACON ORANGE TIMEOUT ST (SAFETY) ×2 IMPLANT
EYE SHIELD UNIVERSAL CLEAR (GAUZE/BANDAGES/DRESSINGS) ×2 IMPLANT
GLOVE BIOGEL PI IND STRL 7.0 (GLOVE) IMPLANT
GLOVE BIOGEL PI IND STRL 7.5 (GLOVE) IMPLANT
GLOVE BIOGEL PI INDICATOR 7.0 (GLOVE) ×4
GLOVE BIOGEL PI INDICATOR 7.5 (GLOVE) ×2
GOWN STRL REUS W/TWL LRG LVL3 (GOWN DISPOSABLE) ×2 IMPLANT
LENS IOL MULTIFOCL TECNIS 18.5 (Intraocular Lens) ×3 IMPLANT
LENS IOL TECNIS MF 3P 18.5 (Intraocular Lens) IMPLANT
NDL HYPO 18GX1.5 BLUNT FILL (NEEDLE) IMPLANT
NEEDLE HYPO 18GX1.5 BLUNT FILL (NEEDLE) ×3 IMPLANT
PAD ARMBOARD 7.5X6 YLW CONV (MISCELLANEOUS) ×2 IMPLANT
PROC W SPEC LENS (INTRAOCULAR LENS) ×3
PROCESS W SPEC LENS (INTRAOCULAR LENS) IMPLANT
RING MALYGIN (MISCELLANEOUS) IMPLANT
SYR TB 1ML LL NO SAFETY (SYRINGE) ×2 IMPLANT
TAPE SURG TRANSPORE 1 IN (GAUZE/BANDAGES/DRESSINGS) IMPLANT
TAPE SURGICAL TRANSPORE 1 IN (GAUZE/BANDAGES/DRESSINGS) ×2
VISCOELASTIC ADDITIONAL (OPHTHALMIC RELATED) ×2 IMPLANT
WATER STERILE IRR 250ML POUR (IV SOLUTION) ×2 IMPLANT

## 2018-02-17 NOTE — H&P (Signed)
The H and P was reviewed and updated. The patient was examined.  No changes were found after exam.  The surgical eye was marked.  

## 2018-02-17 NOTE — Op Note (Signed)
Date of procedure: 02/17/18  Pre-operative diagnosis: Visually significant cataract, Right Eye; Visually Significant Astigmatism, Right Eye (H25.811)  Post-operative diagnosis: Visually significant cataract, Right Eye; Visually Significant Astigmatism, Right Eye  Procedure: Removal of cataract via phacoemulsification and insertion of intra-ocular lens AMO ZCT150 +22.0D into the capsular bag of the Right Eye  Attending surgeon: Gerda Diss. Jasiah Buntin, MD, MA  Anesthesia: MAC, Topical Akten  Complications: None  Estimated Blood Loss: <27m (minimal)  Specimens: None  Implants: As above  Indications:  Visually significant cataract, Right Eye; Visually Significant Astigmatism, Right Eye  Procedure:  The patient was seen and identified in the pre-operative area. The operative eye was identified and dilated.  The operative eye was marked.  Pre-operative toric markers were used to mark the eye at 0 and 180 degrees. Topical anesthesia was administered to the operative eye.     The patient was then to the operative suite and placed in the supine position.  A timeout was performed confirming the patient, procedure to be performed, and all other relevant information.   The patient's face was prepped and draped in the usual fashion for intra-ocular surgery.  A lid speculum was placed into the operative eye and the surgical microscope moved into place and focused.  A superotemporal paracentesis was created using a 20 gauge paracentesis blade.  Shugarcaine was injected into the anterior chamber.  Viscoelastic was injected into the anterior chamber.  A temporal clear-corneal main wound incision was created using a 2.415mmicrokeratome.  A continuous curvilinear capsulorrhexis was initiated using an irrigating cystitome and completed using capsulorrhexis forceps.  Hydrodissection and hydrodeliniation were performed.  Viscoelastic was injected into the anterior chamber.  A phacoemulsification handpiece and a chopper  as a second instrument were used to remove the nucleus and epinucleus. The irrigation/aspiration handpiece was used to remove any remaining cortical material.   The capsular bag was reinflated with viscoelastic, checked, and found to be intact.  The intraocular lens was inserted into the capsular bag and dialed into place using a Kuglen hook to ?? degrees.  The irrigation/aspiration handpiece was used to remove any remaining viscoelastic.  The clear corneal wound and paracentesis wounds were then hydrated and checked with Weck-Cels to be watertight.  The lid-speculum and drape was removed, and the patient's face was cleaned with a wet and dry 4x4.  Maxitrol was instilled in the eye before a clear shield was taped over the eye. The patient was taken to the post-operative care unit in good condition, having tolerated the procedure well.  Post-Op Instructions: [facility] and will receive all other orders and instructions.

## 2018-02-17 NOTE — Anesthesia Preprocedure Evaluation (Signed)
Anesthesia Evaluation  Patient identified by MRN, date of birth, ID band Patient awake    Reviewed: Allergy & Precautions, H&P , NPO status , Patient's Chart, lab work & pertinent test results, reviewed documented beta blocker date and time   Airway Mallampati: II  TM Distance: >3 FB Neck ROM: full    Dental no notable dental hx.    Pulmonary neg pulmonary ROS, asthma , former smoker,    Pulmonary exam normal breath sounds clear to auscultation       Cardiovascular Exercise Tolerance: Good hypertension, negative cardio ROS   Rhythm:regular Rate:Normal     Neuro/Psych negative neurological ROS  negative psych ROS   GI/Hepatic negative GI ROS, Neg liver ROS, GERD  ,  Endo/Other  negative endocrine ROS  Renal/GU negative Renal ROS  negative genitourinary   Musculoskeletal   Abdominal   Peds  Hematology negative hematology ROS (+)   Anesthesia Other Findings   Reproductive/Obstetrics negative OB ROS                             Anesthesia Physical Anesthesia Plan  ASA: III  Anesthesia Plan: MAC   Post-op Pain Management:    Induction:   PONV Risk Score and Plan:   Airway Management Planned:   Additional Equipment:   Intra-op Plan:   Post-operative Plan:   Informed Consent: I have reviewed the patients History and Physical, chart, labs and discussed the procedure including the risks, benefits and alternatives for the proposed anesthesia with the patient or authorized representative who has indicated his/her understanding and acceptance.     Plan Discussed with: CRNA  Anesthesia Plan Comments:         Anesthesia Quick Evaluation

## 2018-02-17 NOTE — Discharge Instructions (Addendum)
Please discharge patient when stable, will follow up today with Dr. Wrzosek at the [facility] office immediately following discharge.  Leave shield in place until visit.  All paperwork with discharge instructions will be given at the office. ° ° °Monitored Anesthesia Care, Care After °These instructions provide you with information about caring for yourself after your procedure. Your health care provider may also give you more specific instructions. Your treatment has been planned according to current medical practices, but problems sometimes occur. Call your health care provider if you have any problems or questions after your procedure. °What can I expect after the procedure? °After your procedure, it is common to: °· Feel sleepy for several hours. °· Feel clumsy and have poor balance for several hours. °· Feel forgetful about what happened after the procedure. °· Have poor judgment for several hours. °· Feel nauseous or vomit. °· Have a sore throat if you had a breathing tube during the procedure. ° °Follow these instructions at home: °For at least 24 hours after the procedure: ° °· Do not: °? Participate in activities in which you could fall or become injured. °? Drive. °? Use heavy machinery. °? Drink alcohol. °? Take sleeping pills or medicines that cause drowsiness. °? Make important decisions or sign legal documents. °? Take care of children on your own. °· Rest. °Eating and drinking °· Follow the diet that is recommended by your health care provider. °· If you vomit, drink water, juice, or soup when you can drink without vomiting. °· Make sure you have little or no nausea before eating solid foods. °General instructions °· Have a responsible adult stay with you until you are awake and alert. °· Take over-the-counter and prescription medicines only as told by your health care provider. °· If you smoke, do not smoke without supervision. °· Keep all follow-up visits as told by your health care  provider. This is important. °Contact a health care provider if: °· You keep feeling nauseous or you keep vomiting. °· You feel light-headed. °· You develop a rash. °· You have a fever. °Get help right away if: °· You have trouble breathing. °This information is not intended to replace advice given to you by your health care provider. Make sure you discuss any questions you have with your health care provider. °Document Released: 09/07/2015 Document Revised: 01/07/2016 Document Reviewed: 09/07/2015 °Elsevier Interactive Patient Education © 2018 Elsevier Inc. ° °

## 2018-02-17 NOTE — Transfer of Care (Signed)
Immediate Anesthesia Transfer of Care Note  Patient: Pamela Hughes  Procedure(s) Performed: CATARACT EXTRACTION PHACO AND INTRAOCULAR LENS PLACEMENT (IOC) (Right Eye)  Patient Location: Short Stay  Anesthesia Type:MAC  Level of Consciousness: awake  Airway & Oxygen Therapy: Patient Spontanous Breathing  Post-op Assessment: Report given to RN  Post vital signs: Reviewed  Last Vitals:  Vitals Value Taken Time  BP    Temp    Pulse    Resp    SpO2      Last Pain:  Vitals:   02/17/18 0854  TempSrc: Oral  PainSc: 0-No pain         Complications: No apparent anesthesia complications

## 2018-02-17 NOTE — Anesthesia Postprocedure Evaluation (Signed)
Anesthesia Post Note  Patient: Pamela Hughes  Procedure(s) Performed: CATARACT EXTRACTION PHACO AND INTRAOCULAR LENS PLACEMENT (IOC) (Right Eye)  Patient location during evaluation: Short Stay Anesthesia Type: MAC Level of consciousness: awake and alert and oriented Pain management: pain level controlled Vital Signs Assessment: post-procedure vital signs reviewed and stable Respiratory status: spontaneous breathing Cardiovascular status: blood pressure returned to baseline and stable Postop Assessment: no apparent nausea or vomiting Anesthetic complications: no     Last Vitals:  Vitals:   02/17/18 0854 02/17/18 0928  BP: (!) 151/66 (!) 149/63  Pulse: 64 61  Temp: 36.6 C   SpO2: 98% 98%    Last Pain:  Vitals:   02/17/18 0854  TempSrc: Oral  PainSc: 0-No pain                 [name]

## 2018-02-21 ENCOUNTER — Encounter (HOSPITAL_COMMUNITY): Payer: Self-pay | Admitting: Ophthalmology

## 2018-03-13 ENCOUNTER — Other Ambulatory Visit (HOSPITAL_COMMUNITY): Payer: Medicare Other

## 2018-03-17 ENCOUNTER — Other Ambulatory Visit: Payer: Self-pay | Admitting: Family Medicine

## 2018-03-20 NOTE — Telephone Encounter (Signed)
Last seen 12/14/17.

## 2018-03-21 ENCOUNTER — Encounter: Payer: Self-pay | Admitting: Family Medicine

## 2018-03-21 ENCOUNTER — Ambulatory Visit (INDEPENDENT_AMBULATORY_CARE_PROVIDER_SITE_OTHER): Payer: Medicare Other | Admitting: Family Medicine

## 2018-03-21 ENCOUNTER — Other Ambulatory Visit: Payer: Self-pay

## 2018-03-21 VITALS — BP 139/64 | HR 91 | Temp 97.7°F | Ht 65.0 in | Wt 142.0 lb

## 2018-03-21 DIAGNOSIS — Z23 Encounter for immunization: Secondary | ICD-10-CM | POA: Diagnosis not present

## 2018-03-21 DIAGNOSIS — R05 Cough: Secondary | ICD-10-CM

## 2018-03-21 DIAGNOSIS — M6281 Muscle weakness (generalized): Secondary | ICD-10-CM

## 2018-03-21 DIAGNOSIS — Z8709 Personal history of other diseases of the respiratory system: Secondary | ICD-10-CM | POA: Diagnosis not present

## 2018-03-21 DIAGNOSIS — R053 Chronic cough: Secondary | ICD-10-CM

## 2018-03-21 DIAGNOSIS — R0609 Other forms of dyspnea: Secondary | ICD-10-CM | POA: Diagnosis not present

## 2018-03-21 DIAGNOSIS — Z87891 Personal history of nicotine dependence: Secondary | ICD-10-CM

## 2018-03-21 MED ORDER — BUPROPION HCL ER (SR) 150 MG PO TB12: 150.0000 mg | ORAL_TABLET | Freq: Two times a day (BID) | ORAL | 0 refills | Status: DC

## 2018-03-21 MED ORDER — HYDROCODONE-HOMATROPINE 5-1.5 MG/5ML PO SYRP: 5.0000 mL | ORAL_SOLUTION | Freq: Four times a day (QID) | ORAL | 0 refills | Status: DC | PRN

## 2018-03-21 MED ORDER — ROSUVASTATIN CALCIUM 10 MG PO TABS: 10.0000 mg | ORAL_TABLET | Freq: Every day | ORAL | 11 refills | Status: DC

## 2018-03-21 MED ORDER — OMEPRAZOLE 20 MG PO CPDR: DELAYED_RELEASE_CAPSULE | ORAL | 1 refills | Status: DC

## 2018-03-21 MED ORDER — CHLORTHALIDONE 25 MG PO TABS: ORAL_TABLET | ORAL | 1 refills | Status: DC

## 2018-03-21 NOTE — Progress Notes (Signed)
Subjective: CC: weakness, shortness of breath PCP: Dettinger, Fransisca Kaufmann, MD ONG:EXBMW Pamela Hughes is a 77 y.o. female presenting to clinic today for:  1. Weakness, cough Patient reports long-standing history of cough.  She reports that she has a history of asthma.  She reports a dyspnea on exertion that has been chronic.  This is been worked up by cardiology previously.  She is currently treated with albuterol, Hycodan as needed and Allegra.  She also uses an Atrovent nasal spray.  She is on omeprazole for acid reflux.  She is never seen a pulmonologist for chronic cough.  X-ray was obtained in July and was unremarkable.  Patient was a smoker many years ago but states that she quit over 40 years ago.  Denies any hemoptysis, fevers, chills.  She reports chronic right ankle swelling.  She reports that weakness has been more prominent since starting atorvastatin about a month ago.  She was previously able to walk about 2 miles per day but now is down to half a mile per day.  She wonders if the atorvastatin is causing this.  Denies any myalgia per se but feels like she is more weak.   ROS: Per HPI  No Known Allergies Past Medical History:  Diagnosis Date  . Allergy   . Alopecia   . Anxiety   . Arthritis   . Asthma   . Collagenous colitis   . Depression   . Diarrhea    chronic- "states for at least a year)  . Edema   . GERD (gastroesophageal reflux disease)   . HOH (hard of hearing)   . Hypertension     Current Outpatient Medications:  .  albuterol (PROVENTIL HFA;VENTOLIN HFA) 108 (90 Base) MCG/ACT inhaler, Inhale 2 puffs into the lungs every 6 (six) hours as needed for wheezing or shortness of breath., Disp: 1 Inhaler, Rfl: 0 .  aspirin EC 81 MG tablet, Take 1 tablet (81 mg total) by mouth daily., Disp: , Rfl:  .  Aspirin-Acetaminophen-Caffeine (GOODY HEADACHE PO), Take 1 packet by mouth daily as needed (for pain). , Disp: , Rfl:  .  buPROPion (WELLBUTRIN SR) 150 MG 12 hr tablet, TAKE  ONE TABLET BY MOUTH TWICE DAILY (Patient taking differently: Take 150 mg by mouth 2 (two) times daily. ), Disp: 180 tablet, Rfl: 0 .  Calcium Carbonate-Vitamin D (CALCIUM 600+D PO), Take 1 tablet by mouth 2 (two) times daily., Disp: , Rfl:  .  chlorthalidone (HYGROTON) 25 MG tablet, TAKE ONE (1) TABLET EACH DAY (Patient taking differently: Take 25 mg by mouth daily. ), Disp: 90 tablet, Rfl: 0 .  cholecalciferol (VITAMIN D) 1000 UNITS tablet, Take 1,000 Units by mouth daily. , Disp: , Rfl:  .  clotrimazole (MYCELEX) 10 MG troche, DISSOLVE 1 TABLET IN MOUTH 5 TIMES DAILY (Needs to be seen) (Patient taking differently: Take 10 mg by mouth daily as needed (for infection in mouth). ), Disp: 35 tablet, Rfl: 0 .  fexofenadine (ALLEGRA) 180 MG tablet, Take 180 mg by mouth daily., Disp: , Rfl:  .  Glucosamine-Chondroitin (OSTEO BI-FLEX REGULAR STRENGTH PO), Take 3 tablets by mouth daily. , Disp: , Rfl:  .  Hypromellose (ARTIFICIAL TEARS OP), Place 1 drop into both eyes daily as needed (for dry eyes). , Disp: , Rfl:  .  ipratropium (ATROVENT) 0.03 % nasal spray, USE TWICE DAILY AS DIRECTED (Patient taking differently: Place 1 spray into both nostrils 2 (two) times daily. ), Disp: 30 mL, Rfl: 3 .  loperamide (IMODIUM) 2 MG capsule, Take 1 capsule (2 mg total) by mouth daily before breakfast. (Patient taking differently: Take 1 mg by mouth daily. ), Disp: 30 capsule, Rfl:  .  Meclizine HCl 25 MG CHEW, TAKE ONE TABLET 3 TIMES A DAY AS NEEDED. (Patient taking differently: Chew 25 mg by mouth 3 (three) times daily as needed (for dizziness). ), Disp: 30 each, Rfl: 0 .  Melatonin 5 MG CAPS, Take 5 mg by mouth at bedtime., Disp: , Rfl:  .  meloxicam (MOBIC) 7.5 MG tablet, Take 7.5 mg by mouth once daily as needed for pain (Patient taking differently: Take 7.5 mg by mouth daily as needed for pain. ), Disp: 90 tablet, Rfl: 0 .  Multiple Vitamin (MULTIVITAMIN WITH MINERALS) TABS tablet, Take 1 tablet by mouth daily. Centrum  Silver, Disp: , Rfl:  .  Multiple Vitamins-Minerals (ICAPS PO), Take 1 capsule by mouth daily., Disp: , Rfl:  .  omeprazole (PRILOSEC) 20 MG capsule, TAKE ONE (1) CAPSULE EACH DAY (Patient taking differently: Take 20 mg by mouth daily. ), Disp: 90 capsule, Rfl: 0 .  traZODone (DESYREL) 150 MG tablet, TAKE ONE TABLET DAILY AT BEDTIME, Disp: 30 tablet, Rfl: 0 .  HYDROcodone-homatropine (HYCODAN) 5-1.5 MG/5ML syrup, Take 5 mLs by mouth every 6 (six) hours as needed for cough., Disp: 100 mL, Rfl: 0 .  rosuvastatin (CRESTOR) 10 MG tablet, Take 1 tablet (10 mg total) by mouth daily., Disp: 30 tablet, Rfl: 11 Social History   Socioeconomic History  . Marital status: Divorced    Spouse name: Not on file  . Number of children: 1  . Years of education: 66  . Highest education level: 10th grade  Occupational History  . Occupation: Retired    Comment: Copy  . Financial resource strain: Not very hard  . Food insecurity:    Worry: Never true    Inability: Not on file  . Transportation needs:    Medical: No    Non-medical: No  Tobacco Use  . Smoking status: Former Smoker    Packs/day: 0.25    Years: 5.00    Pack years: 1.25    Types: Cigarettes    Last attempt to quit: 10/09/1974    Years since quitting: 43.4  . Smokeless tobacco: Never Used  Substance and Sexual Activity  . Alcohol use: Yes    Comment: glass of wine occasionally  . Drug use: No  . Sexual activity: Yes    Birth control/protection: Surgical  Lifestyle  . Physical activity:    Days per week: 0 days    Minutes per session: Not on file  . Stress: Not on file  Relationships  . Social connections:    Talks on phone: Three times a week    Gets together: Twice a week    Attends religious service: More than 4 times per year    Active member of club or organization: No    Attends meetings of clubs or organizations: Never    Relationship status: Divorced  . Intimate partner violence:    Fear of current or  ex partner: Not on file    Emotionally abused: Not on file    Physically abused: Not on file    Forced sexual activity: Not on file  Other Topics Concern  . Not on file  Social History Narrative  . Not on file   Family History  Problem Relation Age of Onset  . Alzheimer's disease Mother   .  Alcohol abuse Father   . Diabetes Brother   . Healthy Daughter     Objective: Office vital signs reviewed. BP 139/64   Pulse 91   Temp 97.7 F (36.5 C) (Oral)   Ht _0  (1.651 m)   Wt 142 lb (64.4 kg)   BMI 23.63 kg/m   Physical Examination:  General: Awake, alert, well nourished, No acute distress HEENT: Normal    Eyes: PERRLA, extraocular membranes intact, sclera white    Nose: nasal turbinates moist, clear nasal discharge    Throat: moist mucus membranes, no erythema, no tonsillar exudate.  Airway is patent Cardio: regular rate and rhythm, O2H4 heard, systolic murmur appreciated at RSB and LSB Pulm: clear to auscultation bilaterally, no wheezes, rhonchi or rales; normal work of breathing on room air Extremities: warm, well perfused, No edema, cyanosis or clubbing; +2 pulses bilaterally MSK: normal gait and station  Assessment/ Plan: 77 y.o. female   1. Chronic cough I reviewed her chest x-ray results and office visit with Dr. Bronson Ing in August.  We discussed that use of Hycodan is not a long-term fix to her chronic cough.  I will place a referral to pulmonology for PFTs.  I also gave her sample of Symbicort 80 mg today.  Use was demonstrated during the office visit today.  Continue antihistamines.  Will check CBC.  Follow-up with PCP in 2 to 4 weeks for recheck. - CBC - Ambulatory referral to Pulmonology - HYDROcodone-homatropine (HYCODAN) 5-1.5 MG/5ML syrup; Take 5 mLs by mouth every 6 (six) hours as needed for cough.  Dispense: 100 mL; Refill: 0  2. Dyspnea on exertion Possibly related to uncontrolled asthma.  Referral to pulmonology placed. - CBC - Ambulatory referral  to Pulmonology  3. Former smoker - Ambulatory referral to Pulmonology  4. History of asthma - Ambulatory referral to Pulmonology  5. Muscle weakness Possibly related to statin versus chronic deconditioning.  I have discontinued the atorvastatin and started Crestor 10 mg.  Medication was started for internal carotid artery stenosis.  Will check CK and CMP. - CK - CMP14+EGFR  6. Encounter for immunization - Flu vaccine HIGH DOSE PF   Orders Placed This Encounter  Procedures  . CK  . CMP14+EGFR  . CBC  . Ambulatory referral to Pulmonology    Referral Priority:   Routine    Referral Type:   Consultation    Referral Reason:   Specialty Services Required    Requested Specialty:   Pulmonary Disease    Number of Visits Requested:   1   Meds ordered this encounter  Medications  . HYDROcodone-homatropine (HYCODAN) 5-1.5 MG/5ML syrup    Sig: Take 5 mLs by mouth every 6 (six) hours as needed for cough.    Dispense:  100 mL    Refill:  0  . rosuvastatin (CRESTOR) 10 MG tablet    Sig: Take 1 tablet (10 mg total) by mouth daily.    Dispense:  30 tablet    Refill:  11   The Narcotic Database has been reviewed.  There were no red flags.     Janora Norlander, DO [place] [phone]

## 2018-03-21 NOTE — Patient Instructions (Signed)
I have placed a referral to the pulmonologist, lung specialist for your chronic cough and shortness of breath.  I have also given you a sample of an inhaler called Symbicort that I would like you to use 2 puffs 2 times per day until you follow-up with your primary care doctor, Dr. Louanne Skye in a couple of weeks.  If your cough is asthma mediated, this medicine should help with your symptoms.  I have also refilled your cough medication.  This medicine does contain hydrocodone and should be used extremely carefully, as it can increase your risk of falls and injury.  Do not drive while taking this medicine.  Please inform your eye doctor that you are taking the medicine.  For your muscle weakness, we are checking muscle enzymes and your liver function and we will contact you with the results in a few days.  I have changed your atorvastatin to rosuvastatin.  Take 1 tablet every night at bedtime.

## 2018-03-22 LAB — CMP14+EGFR
ALBUMIN: 4.2 g/dL (ref 3.5–4.8)
ALT: 14 IU/L (ref 0–32)
AST: 23 IU/L (ref 0–40)
Albumin/Globulin Ratio: 1.9 (ref 1.2–2.2)
Alkaline Phosphatase: 87 IU/L (ref 39–117)
BUN/Creatinine Ratio: 13 (ref 12–28)
BUN: 19 mg/dL (ref 8–27)
Bilirubin Total: 0.3 mg/dL (ref 0.0–1.2)
CO2: 27 mmol/L (ref 20–29)
CREATININE: 1.51 mg/dL — AB (ref 0.57–1.00)
Calcium: 9.2 mg/dL (ref 8.7–10.3)
Chloride: 89 mmol/L — ABNORMAL LOW (ref 96–106)
GFR, EST AFRICAN AMERICAN: 38 mL/min/{1.73_m2} — AB (ref >59)
GFR, EST NON AFRICAN AMERICAN: 33 mL/min/{1.73_m2} — AB (ref >59)
GLUCOSE: 115 mg/dL — AB (ref 65–99)
Globulin, Total: 2.2 g/dL (ref 1.5–4.5)
Potassium: 3.9 mmol/L (ref 3.5–5.2)
Sodium: 131 mmol/L — ABNORMAL LOW (ref 134–144)
Total Protein: 6.4 g/dL (ref 6.0–8.5)

## 2018-03-22 LAB — CBC
HEMATOCRIT: 32 % — AB (ref 34.0–46.6)
HEMOGLOBIN: 11.7 g/dL (ref 11.1–15.9)
MCH: 35.2 pg — ABNORMAL HIGH (ref 26.6–33.0)
MCHC: 36.6 g/dL — ABNORMAL HIGH (ref 31.5–35.7)
MCV: 96 fL (ref 79–97)
Platelets: 304 10*3/uL (ref 150–450)
RBC: 3.32 x10E6/uL — AB (ref 3.77–5.28)
RDW: 11.9 % — ABNORMAL LOW (ref 12.3–15.4)
WBC: 6.9 10*3/uL (ref 3.4–10.8)

## 2018-03-22 LAB — CK: Total CK: 86 U/L (ref 24–173)

## 2018-03-23 DIAGNOSIS — H25812 Combined forms of age-related cataract, left eye: Secondary | ICD-10-CM | POA: Diagnosis not present

## 2018-03-27 ENCOUNTER — Encounter (HOSPITAL_COMMUNITY): Payer: Self-pay

## 2018-03-27 ENCOUNTER — Encounter (HOSPITAL_COMMUNITY)
Admission: RE | Admit: 2018-03-27 | Discharge: 2018-03-27 | Disposition: A | Discharge status: Home / Self Care | Payer: Medicare Other | Source: Ambulatory Visit | Attending: Ophthalmology | Admitting: Ophthalmology

## 2018-03-31 ENCOUNTER — Ambulatory Visit (HOSPITAL_COMMUNITY): Payer: Medicare Other | Admitting: Anesthesiology

## 2018-03-31 ENCOUNTER — Encounter (HOSPITAL_COMMUNITY)
Admission: RE | Disposition: A | Discharge status: Home / Self Care | Payer: Self-pay | Source: Ambulatory Visit | Attending: Ophthalmology

## 2018-03-31 ENCOUNTER — Encounter (HOSPITAL_COMMUNITY): Payer: Self-pay | Admitting: Anesthesiology

## 2018-03-31 ENCOUNTER — Ambulatory Visit (HOSPITAL_COMMUNITY)
Admission: RE | Admit: 2018-03-31 | Discharge: 2018-03-31 | Disposition: A | Discharge status: Home / Self Care | Payer: Medicare Other | Source: Ambulatory Visit | Attending: Ophthalmology | Admitting: Ophthalmology

## 2018-03-31 DIAGNOSIS — F329 Major depressive disorder, single episode, unspecified: Secondary | ICD-10-CM | POA: Insufficient documentation

## 2018-03-31 DIAGNOSIS — Z79899 Other long term (current) drug therapy: Secondary | ICD-10-CM | POA: Diagnosis not present

## 2018-03-31 DIAGNOSIS — H52202 Unspecified astigmatism, left eye: Secondary | ICD-10-CM | POA: Diagnosis not present

## 2018-03-31 DIAGNOSIS — R42 Dizziness and giddiness: Secondary | ICD-10-CM | POA: Diagnosis not present

## 2018-03-31 DIAGNOSIS — Z87891 Personal history of nicotine dependence: Secondary | ICD-10-CM | POA: Diagnosis not present

## 2018-03-31 DIAGNOSIS — J45909 Unspecified asthma, uncomplicated: Secondary | ICD-10-CM | POA: Diagnosis not present

## 2018-03-31 DIAGNOSIS — K219 Gastro-esophageal reflux disease without esophagitis: Secondary | ICD-10-CM | POA: Insufficient documentation

## 2018-03-31 DIAGNOSIS — H25812 Combined forms of age-related cataract, left eye: Secondary | ICD-10-CM | POA: Diagnosis not present

## 2018-03-31 DIAGNOSIS — I10 Essential (primary) hypertension: Secondary | ICD-10-CM | POA: Insufficient documentation

## 2018-03-31 DIAGNOSIS — H2512 Age-related nuclear cataract, left eye: Secondary | ICD-10-CM | POA: Diagnosis not present

## 2018-03-31 DIAGNOSIS — H269 Unspecified cataract: Secondary | ICD-10-CM | POA: Diagnosis not present

## 2018-03-31 HISTORY — PX: CATARACT EXTRACTION W/PHACO: SHX586

## 2018-03-31 SURGERY — PHACOEMULSIFICATION, CATARACT, WITH IOL INSERTION
Anesthesia: Monitor Anesthesia Care | Site: Eye | Laterality: Left

## 2018-03-31 MED ORDER — MIDAZOLAM HCL 2 MG/2ML IJ SOLN
INTRAMUSCULAR | Status: AC
  Filled 2018-03-31: qty 2

## 2018-03-31 MED ORDER — MIDAZOLAM HCL 5 MG/5ML IJ SOLN
INTRAMUSCULAR | Status: DC | PRN
  Administered 2018-03-31 (×2): 1 mg via INTRAVENOUS

## 2018-03-31 MED ORDER — NEOMYCIN-POLYMYXIN-DEXAMETH 3.5-10000-0.1 OP SUSP
OPHTHALMIC | Status: DC | PRN
  Administered 2018-03-31: 1 [drp] via OPHTHALMIC

## 2018-03-31 MED ORDER — PHENYLEPHRINE HCL 2.5 % OP SOLN
1.0000 [drp] | OPHTHALMIC | Status: AC
  Administered 2018-03-31 (×3): 1 [drp] via OPHTHALMIC

## 2018-03-31 MED ORDER — PROPOFOL 10 MG/ML IV BOLUS
INTRAVENOUS | Status: AC
  Filled 2018-03-31: qty 20

## 2018-03-31 MED ORDER — CYCLOPENTOLATE-PHENYLEPHRINE 0.2-1 % OP SOLN
1.0000 [drp] | OPHTHALMIC | Status: AC
  Administered 2018-03-31 (×3): 1 [drp] via OPHTHALMIC

## 2018-03-31 MED ORDER — FENTANYL CITRATE (PF) 100 MCG/2ML IJ SOLN
INTRAMUSCULAR | Status: AC
  Filled 2018-03-31: qty 2

## 2018-03-31 MED ORDER — PROVISC 10 MG/ML IO SOLN
INTRAOCULAR | Status: DC | PRN
  Administered 2018-03-31: 0.85 mL via INTRAOCULAR

## 2018-03-31 MED ORDER — POVIDONE-IODINE 5 % OP SOLN
OPHTHALMIC | Status: DC | PRN
  Administered 2018-03-31: 1 via OPHTHALMIC

## 2018-03-31 MED ORDER — EPINEPHRINE PF 1 MG/ML IJ SOLN
INTRAMUSCULAR | Status: AC
  Filled 2018-03-31: qty 2

## 2018-03-31 MED ORDER — LACTATED RINGERS IV SOLN
INTRAVENOUS | Status: DC
  Administered 2018-03-31: 11:00:00 via INTRAVENOUS

## 2018-03-31 MED ORDER — BSS IO SOLN
INTRAOCULAR | Status: DC | PRN
  Administered 2018-03-31: 15 mL via INTRAOCULAR

## 2018-03-31 MED ORDER — LIDOCAINE HCL 3.5 % OP GEL
1.0000 "application " | Freq: Once | OPHTHALMIC | Status: AC
  Administered 2018-03-31: 1 via OPHTHALMIC

## 2018-03-31 MED ORDER — SODIUM HYALURONATE 23 MG/ML IO SOLN
INTRAOCULAR | Status: DC | PRN
  Administered 2018-03-31: 0.6 mL via INTRAOCULAR

## 2018-03-31 MED ORDER — TETRACAINE HCL 0.5 % OP SOLN
1.0000 [drp] | OPHTHALMIC | Status: AC
  Administered 2018-03-31 (×3): 1 [drp] via OPHTHALMIC

## 2018-03-31 MED ORDER — LIDOCAINE HCL (PF) 1 % IJ SOLN
INTRAOCULAR | Status: DC | PRN
  Administered 2018-03-31: 1 mL via OPHTHALMIC

## 2018-03-31 MED ORDER — EPINEPHRINE PF 1 MG/ML IJ SOLN
INTRAOCULAR | Status: DC | PRN
  Administered 2018-03-31: 500 mL

## 2018-03-31 SURGICAL SUPPLY — 20 items
CLOTH BEACON ORANGE TIMEOUT ST (SAFETY) ×2 IMPLANT
EYE SHIELD UNIVERSAL CLEAR (GAUZE/BANDAGES/DRESSINGS) ×2 IMPLANT
GLOVE BIOGEL PI IND STRL 6.5 (GLOVE) IMPLANT
GLOVE BIOGEL PI IND STRL 7.0 (GLOVE) IMPLANT
GLOVE BIOGEL PI IND STRL 7.5 (GLOVE) IMPLANT
GLOVE BIOGEL PI INDICATOR 6.5 (GLOVE)
GLOVE BIOGEL PI INDICATOR 7.0 (GLOVE) ×4
GLOVE BIOGEL PI INDICATOR 7.5 (GLOVE) ×2
NDL HYPO 18GX1.5 BLUNT FILL (NEEDLE) IMPLANT
NEEDLE HYPO 18GX1.5 BLUNT FILL (NEEDLE) ×3 IMPLANT
PAD ARMBOARD 7.5X6 YLW CONV (MISCELLANEOUS) ×2 IMPLANT
PROC W SPEC LENS (INTRAOCULAR LENS) ×3
PROCESS W SPEC LENS (INTRAOCULAR LENS) IMPLANT
RING MALYGIN (MISCELLANEOUS) IMPLANT
SYR TB 1ML LL NO SAFETY (SYRINGE) ×2 IMPLANT
TAPE SURG TRANSPORE 1 IN (GAUZE/BANDAGES/DRESSINGS) IMPLANT
TAPE SURGICAL TRANSPORE 1 IN (GAUZE/BANDAGES/DRESSINGS) ×2
Tecnis Toric IOL (Intraocular Lens) ×2 IMPLANT
VISCOELASTIC ADDITIONAL (OPHTHALMIC RELATED) ×2 IMPLANT
WATER STERILE IRR 250ML POUR (IV SOLUTION) ×2 IMPLANT

## 2018-03-31 NOTE — Transfer of Care (Signed)
Immediate Anesthesia Transfer of Care Note  Patient: Pamela Hughes  Procedure(s) Performed: CATARACT EXTRACTION PHACO AND INTRAOCULAR LENS PLACEMENT (IOC) LEFT CDE: 4.51 (Left Eye)  Patient Location: Short Stay  Anesthesia Type:MAC  Level of Consciousness: awake, alert  and patient cooperative  Airway & Oxygen Therapy: Patient Spontanous Breathing  Post-op Assessment: Report given to RN and Post -op Vital signs reviewed and stable  Post vital signs: Reviewed and stable  Last Vitals:  Vitals Value Taken Time  BP    Temp    Pulse    Resp    SpO2      Last Pain:  Vitals:   03/31/18 1023  TempSrc: Oral  PainSc: 0-No pain      Patients Stated Pain Goal: 4 (86/82/57 [id number])  Complications: No apparent anesthesia complications

## 2018-03-31 NOTE — Op Note (Signed)
Date of procedure: 03/31/18  Pre-operative diagnosis: Visually significant cataract, Left Eye; Visually Significant Astigmatism, Left Eye (H25.?1)  Post-operative diagnosis: Visually significant cataract, Left Eye; Visually Significant Astigmatism, Left Eye  Procedure: Removal of cataract via phacoemulsification and insertion of intra-ocular lens AMO ZCT150 +21.0D into the capsular bag of the Left Eye  Attending surgeon: Gerda Diss. Tobi Leinweber, MD, MA  Anesthesia: MAC, Topical Akten  Complications: None  Estimated Blood Loss: <10m (minimal)  Specimens: None  Implants: As above  Indications:  Visually significant cataract, Left Eye; Visually Significant Astigmatism, Left Eye  Procedure:  The patient was seen and identified in the pre-operative area. The operative eye was identified and dilated.  The operative eye was marked.  Pre-operative toric markers were used to mark the eye at 0 and 180 degrees. Topical anesthesia was administered to the operative eye.     The patient was then to the operative suite and placed in the supine position.  A timeout was performed confirming the patient, procedure to be performed, and all other relevant information.   The patient's face was prepped and draped in the usual fashion for intra-ocular surgery.  A lid speculum was placed into the operative eye and the surgical microscope moved into place and focused.  A superotemporal paracentesis was created using a 20 gauge paracentesis blade.  Shugarcaine was injected into the anterior chamber.  Viscoelastic was injected into the anterior chamber.  A temporal clear-corneal main wound incision was created using a 2.436mmicrokeratome.  A continuous curvilinear capsulorrhexis was initiated using an irrigating cystitome and completed using capsulorrhexis forceps.  Hydrodissection and hydrodeliniation were performed.  Viscoelastic was injected into the anterior chamber.  A phacoemulsification handpiece and a chopper as a  second instrument were used to remove the nucleus and epinucleus. The irrigation/aspiration handpiece was used to remove any remaining cortical material.   The capsular bag was reinflated with viscoelastic, checked, and found to be intact.  The intraocular lens was inserted into the capsular bag and dialed into place using a Kuglen hook to 001 degrees.  The irrigation/aspiration handpiece was used to remove any remaining viscoelastic.  The clear corneal wound and paracentesis wounds were then hydrated and checked with Weck-Cels to be watertight.  The lid-speculum and drape was removed, and the patient's face was cleaned with a wet and dry 4x4.  Maxitrol was instilled in the eye before a clear shield was taped over the eye. The patient was taken to the post-operative care unit in good condition, having tolerated the procedure well.  Post-Op Instructions: [facility] and will receive all other orders and instructions.

## 2018-03-31 NOTE — Discharge Instructions (Signed)
Please discharge patient when stable, will follow up today with Dr. Aceyn Kathol at the [facility] office immediately following discharge.  Leave shield in place until visit.  All paperwork with discharge instructions will be given at the office. ° ° °Monitored Anesthesia Care, Care After °These instructions provide you with information about caring for yourself after your procedure. Your health care provider may also give you more specific instructions. Your treatment has been planned according to current medical practices, but problems sometimes occur. Call your health care provider if you have any problems or questions after your procedure. °What can I expect after the procedure? °After your procedure, it is common to: °· Feel sleepy for several hours. °· Feel clumsy and have poor balance for several hours. °· Feel forgetful about what happened after the procedure. °· Have poor judgment for several hours. °· Feel nauseous or vomit. °· Have a sore throat if you had a breathing tube during the procedure. ° °Follow these instructions at home: °For at least 24 hours after the procedure: ° °· Do not: °? Participate in activities in which you could fall or become injured. °? Drive. °? Use heavy machinery. °? Drink alcohol. °? Take sleeping pills or medicines that cause drowsiness. °? Make important decisions or sign legal documents. °? Take care of children on your own. °· Rest. °Eating and drinking °· Follow the diet that is recommended by your health care provider. °· If you vomit, drink water, juice, or soup when you can drink without vomiting. °· Make sure you have little or no nausea before eating solid foods. °General instructions °· Have a responsible adult stay with you until you are awake and alert. °· Take over-the-counter and prescription medicines only as told by your health care provider. °· If you smoke, do not smoke without supervision. °· Keep all follow-up visits as told by your health care  provider. This is important. °Contact a health care provider if: °· You keep feeling nauseous or you keep vomiting. °· You feel light-headed. °· You develop a rash. °· You have a fever. °Get help right away if: °· You have trouble breathing. °This information is not intended to replace advice given to you by your health care provider. Make sure you discuss any questions you have with your health care provider. °Document Released: 09/07/2015 Document Revised: 01/07/2016 Document Reviewed: 09/07/2015 °Elsevier Interactive Patient Education © 2018 Elsevier Inc. ° °

## 2018-03-31 NOTE — H&P (Signed)
The H and P was reviewed and updated. The patient was examined.  No changes were found after exam.  The surgical eye was marked.  

## 2018-03-31 NOTE — Anesthesia Procedure Notes (Signed)
Procedure Name: MAC Date/Time: 03/31/2018 11:40 AM Performed by: Vista Deck, CRNA Pre-anesthesia Checklist: Patient identified, Emergency Drugs available, Suction available, Timeout performed and Patient being monitored Patient Re-evaluated:Patient Re-evaluated prior to induction Oxygen Delivery Method: Nasal Cannula

## 2018-03-31 NOTE — Anesthesia Preprocedure Evaluation (Signed)
Anesthesia Evaluation    Airway Mallampati: II  TM Distance: >3 FB Neck ROM: Full    Dental no notable dental hx. (+) Missing, Poor Dentition   Pulmonary asthma , former smoker,  Former smoker -states 40 years prior  Uses inhalers for asthma -last 2-3 d ago    Pulmonary exam normal breath sounds clear to auscultation       Cardiovascular Exercise Tolerance: Poor hypertension, Pt. on medications Normal cardiovascular examII Rhythm:Regular Rate:Normal     Neuro/Psych Anxiety Depression    GI/Hepatic GERD  Medicated and Controlled,  Endo/Other    Renal/GU      Musculoskeletal  (+) Arthritis ,   Abdominal   Peds  Hematology   Anesthesia Other Findings   Reproductive/Obstetrics                             Anesthesia Physical Anesthesia Plan  ASA: II  Anesthesia Plan: MAC   Post-op Pain Management:    Induction: Intravenous  PONV Risk Score and Plan:   Airway Management Planned: Nasal Cannula and Simple Face Mask  Additional Equipment:   Intra-op Plan:   Post-operative Plan:   Informed Consent: I have reviewed the patients History and Physical, chart, labs and discussed the procedure including the risks, benefits and alternatives for the proposed anesthesia with the patient or authorized representative who has indicated his/her understanding and acceptance.   Dental advisory given  Plan Discussed with: CRNA  Anesthesia Plan Comments:         Anesthesia Quick Evaluation

## 2018-03-31 NOTE — Anesthesia Postprocedure Evaluation (Signed)
Anesthesia Post Note  Patient: Pamela Hughes  Procedure(s) Performed: CATARACT EXTRACTION PHACO AND INTRAOCULAR LENS PLACEMENT (IOC) LEFT CDE: 4.51 (Left Eye)  Patient location during evaluation: Short Stay Anesthesia Type: MAC Level of consciousness: awake and alert and patient cooperative Pain management: pain level controlled Vital Signs Assessment: post-procedure vital signs reviewed and stable Respiratory status: spontaneous breathing Cardiovascular status: stable Postop Assessment: no apparent nausea or vomiting Anesthetic complications: no     Last Vitals:  Vitals:   03/31/18 1023  BP: (!) 150/65  Pulse: 71  Resp: 18  Temp: 36.4 C  SpO2: 95%    Last Pain:  Vitals:   03/31/18 1023  TempSrc: Oral  PainSc: 0-No pain                 [name]

## 2018-04-04 ENCOUNTER — Encounter (HOSPITAL_COMMUNITY): Payer: Self-pay | Admitting: Ophthalmology

## 2018-04-18 ENCOUNTER — Other Ambulatory Visit: Payer: Self-pay | Admitting: Family Medicine

## 2018-04-19 NOTE — Telephone Encounter (Signed)
Last seen 03/21/18

## 2018-05-09 ENCOUNTER — Institutional Professional Consult (permissible substitution): Payer: Medicare Other | Admitting: Internal Medicine

## 2018-05-15 ENCOUNTER — Institutional Professional Consult (permissible substitution): Payer: Medicare Other | Admitting: Internal Medicine

## 2018-05-18 ENCOUNTER — Institutional Professional Consult (permissible substitution): Payer: Medicare Other | Admitting: Internal Medicine

## 2018-05-18 ENCOUNTER — Ambulatory Visit: Payer: Medicare Other | Admitting: Internal Medicine

## 2018-05-18 ENCOUNTER — Encounter: Payer: Self-pay | Admitting: Internal Medicine

## 2018-05-18 VITALS — BP 162/72 | HR 80 | Ht 65.0 in | Wt 139.0 lb

## 2018-05-18 DIAGNOSIS — R05 Cough: Secondary | ICD-10-CM | POA: Diagnosis not present

## 2018-05-18 DIAGNOSIS — R053 Chronic cough: Secondary | ICD-10-CM

## 2018-05-18 DIAGNOSIS — R0609 Other forms of dyspnea: Secondary | ICD-10-CM | POA: Insufficient documentation

## 2018-05-18 LAB — NITRIC OXIDE: Nitric Oxide: 5

## 2018-05-18 MED ORDER — PANTOPRAZOLE SODIUM 40 MG PO TBEC: 40.0000 mg | DELAYED_RELEASE_TABLET | Freq: Every day | ORAL | 2 refills | Status: DC

## 2018-05-18 MED ORDER — HYDROCODONE-HOMATROPINE 5-1.5 MG/5ML PO SYRP: 5.0000 mL | ORAL_SOLUTION | Freq: Four times a day (QID) | ORAL | 0 refills | Status: DC | PRN

## 2018-05-18 MED ORDER — PREDNISONE 10 MG PO TABS: ORAL_TABLET | ORAL | 0 refills | Status: DC

## 2018-05-18 NOTE — Assessment & Plan Note (Signed)
05/18/2018   Walked RA  2 laps @ 26910ft each @ fast pace  stopped due to  End of study, no desats, some cough > sob   Does not need further w/u until cough is addressed   >> f/u in 2 week with all meds in hand using a trust but verify approach to confirm accurate Medication  Reconciliation The principal here is that until we are certain that the  patients are doing what we've asked, it makes no sense to ask them to do more.     Total time devoted to counseling  > 50 % of initial 60 min office visit:  review case with pt/ discussion of options/alternatives/ personally creating written customized instructions  in presence of pt  then going over those specific  Instructions directly with the pt including how to use all of the meds but in particular covering each new medication in detail and the difference between the maintenance= "automatic" meds and the prns using an action plan format for the latter (If this problem/symptom => do that organization reading Left to right).  Please see AVS from this visit for a full list of these instructions which I personally wrote for this pt and  are unique to this visit.

## 2018-05-18 NOTE — Progress Notes (Signed)
Pamela Hughes, female    DOB: Apr 02, 1941,    MRN: [id number]   Brief patient profile:  [age] yowf quit smoking 1976 due to cost but remembers episodes "with colds' as child but months between illnesses less likely in summer with no need maint rx until 2015/2016 when developed persistent cough with sob worse over the same period of time so referred to pulmonary clinic 05/18/2018 by Pamela   Pamela Hughes   Allergy testing around 2000 positive but not better with shots x one year    History of Present Illness  05/18/2018  Pulmonary/ 1st office eval/Pamela Hughes  Chief Complaint  Patient presents with  . Pulmonary Consult    Referred by Pamela Hughes.  Pt c/o cough "for years and years". She states she occ produces some clear to white sputum.  She is SOB with exertion such as walking accross a parking lot.   Dyspnea:  50 -100 ft  Cough: so hard vomits/otherwise min white mucus  Sleep: better when sleeping and p 5 min of stirring in am  SABA use: not much help Takes prilosec pc and lots of cough drops/ saba doesn't help Does not recall if every took pred x > 5 days  Chest discomfort on R anteriorly just with coughing fits    Pamela Hughes Reflux v Neurogenic Cough Differentiator Reflux Comments  Do you awaken from a sound sleep coughing violently?                            With trouble breathing? Not often   Do you have choking episodes when you cannot  Get enough air, gasping for air ?              Yes    Do you usually cough when you lie down into  The bed, or when you just lie down to rest ?                          Yes   Do you usually cough after meals or eating?         no   Do you cough when (or after) you bend over?    Yes   GERD SCORE     Pamela Hughes Reflux v Neurogenic Cough Differentiator Neurogenic   Do you more-or-less cough all day long? Sporadic    Does change of temperature make you cough? no   Does laughing or chuckling cause you to cough? maybe   Do fumes (perfume, automobile fumes,  burned  Toast, etc.,) cause you to cough ?      Yes    Does speaking, singing, or talking on the phone cause you to cough   ?               No    Neurogenic/Airway score         No obvious other patterns in day to day or daytime variability or assoc excess/ purulent sputum or mucus plugs or hemoptysis or cp or chest tightness, subjective wheeze or overt sinus or hb symptoms.   Sleeps  without nocturnal  or early am exacerbation  of respiratory  c/o's or need for noct saba. Also denies any obvious fluctuation of symptoms with weather or environmental changes or other aggravating or alleviating factors except as outlined above   No unusual exposure hx or h/o childhood pna  or knowledge of premature birth.  Current Allergies,  Complete Past Medical History, Past Surgical History, Family History, and Social History were reviewed in [employer] [facility] record.  ROS  The following are not active complaints unless bolded Hoarseness, sore throat, dysphagia, dental problems, itching, sneezing,  nasal congestion or discharge of excess mucus or purulent secretions, ear ache,   fever, chills, sweats, unintended wt loss or wt gain, classically pleuritic or exertional cp,  orthopnea pnd or arm/hand swelling  or leg swelling, presyncope, palpitations, abdominal pain, anorexia, nausea, vomiting, diarrhea  or change in bowel habits or change in bladder habits, change in stools or change in urine, dysuria, hematuria,  rash, arthralgias, visual complaints, headache, numbness, weakness or ataxia or problems with walking or coordination,  change in mood or  memory.                Past Medical History:  Diagnosis Date  . Allergy   . Alopecia   . Anxiety   . Arthritis   . Asthma   . Collagenous colitis   . Depression   . Diarrhea    chronic- "states for at least a year)  . Edema   . GERD (gastroesophageal reflux disease)   . HOH (hard of hearing)   . Hypertension     Outpatient  Medications Prior to Visit  - NOTE:   Unable to verify as accurately reflecting what pt takes     Medication Sig Dispense Refill  . albuterol (PROVENTIL HFA;VENTOLIN HFA) 108 (90 Base) MCG/ACT inhaler Inhale 2 puffs into the lungs every 6 (six) hours as needed for wheezing or shortness of breath. 1 Inhaler 0  . Aspirin-Acetaminophen-Caffeine (GOODY HEADACHE PO) Take 1 packet by mouth daily as needed (for pain).     Marland Kitchen buPROPion (WELLBUTRIN SR) 150 MG 12 hr tablet Take 1 tablet (150 mg total) by mouth 2 (two) times daily. 180 tablet 0  . Calcium Carbonate-Vitamin D (CALCIUM 600+D PO) Take 1 tablet by mouth 2 (two) times daily.    . chlorthalidone (HYGROTON) 25 MG tablet TAKE ONE (1) TABLET EACH DAY 90 tablet 1  . cholecalciferol (VITAMIN D) 1000 UNITS tablet Take 1,000 Units by mouth daily.     . clotrimazole (MYCELEX) 10 MG troche DISSOLVE 1 TABLET IN MOUTH 5 TIMES DAILY 35 tablet 0  . fexofenadine (ALLEGRA) 180 MG tablet Take 180 mg by mouth daily.    . Glucosamine-Chondroitin (OSTEO BI-FLEX REGULAR STRENGTH PO) Take 3 tablets by mouth daily.     . Hypromellose (ARTIFICIAL TEARS OP) Place 1 drop into both eyes daily as needed (for dry eyes).     Marland Kitchen ipratropium (ATROVENT) 0.03 % nasal spray USE TWICE DAILY AS DIRECTED 30 mL 4  . loperamide (IMODIUM) 2 MG capsule Take 1 capsule (2 mg total) by mouth daily before breakfast. (Patient taking differently: Take 1 mg by mouth daily. ) 30 capsule   . Meclizine HCl 25 MG CHEW TAKE ONE TABLET 3 TIMES A DAY AS NEEDED. (Patient taking differently: Chew 25 mg by mouth 3 (three) times daily as needed (for dizziness). ) 30 each 0  . Melatonin 5 MG CAPS Take 5 mg by mouth at bedtime.    . MELOXICAM PO Take 1 tablet by mouth daily as needed.    . Multiple Vitamin (MULTIVITAMIN WITH MINERALS) TABS tablet Take 1 tablet by mouth daily. Centrum Silver    . Multiple Vitamins-Minerals (ICAPS PO) Take 1 capsule by mouth daily.    Marland Kitchen omeprazole (PRILOSEC) 20 MG capsule TAKE  ONE (1)  CAPSULE EACH DAY 90 capsule 1  . rosuvastatin (CRESTOR) 10 MG tablet Take 1 tablet (10 mg total) by mouth daily. 30 tablet 11  . traZODone (DESYREL) 150 MG tablet TAKE ONE TABLET DAILY AT BEDTIME 30 tablet 0       Objective:     BP (!) 162/72 (BP Location: Left Arm, Cuff Size: Normal)   Pulse 80   Ht 5\' 5"  (1.651 m)   Wt 139 lb (63 kg)   SpO2 99%   BMI 23.13 kg/m   SpO2: 99 %  RA  Stoic amb wf very evasive with questions/ uses lots of dx's like "bronchitis/allergies/asthma" s answering the question asked   HEENT: Top denture/ lower partial  turbinates bilaterally, and oropharynx. Nl external ear canals without cough reflex   NECK :  without JVD/Nodes/TM/ nl carotid upstrokes bilaterally   LUNGS: no acc muscle use,  Nl contour chest which is clear to A and P bilaterally without cough on insp or exp maneuvers   CV:  RRR  no s3 or murmur or increase in P2, and no edema   ABD:  soft and nontender with nl inspiratory excursion in the supine position. No bruits or organomegaly appreciated, bowel sounds nl  MS:  Nl gait/ ext warm without deformities, calf tenderness, cyanosis or clubbing No obvious joint restrictions   SKIN: warm and dry without lesions    NEURO:  alert, approp, nl sensorium with  no motor or cerebellar deficits apparent.      I personally reviewed images and agree with radiology impression as follows:  CXR:   12/05/17  No active cardiopulmonary disease.    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   These are the same pts (now being labeled as having "irritable larynx syndrome" by some cough centers) who not infrequently have a history of having failed to tolerate ace inhibitors,  dry powder inhalers or biphosphonates or report having atypical/extraesophageal reflux symptoms that don't respond to standard doses of PPI  and are easily confused as having aecopd or asthma flares by even experienced allergists/ pulmonologists (myself included).   Of the three most common causes of  Sub-acute / recurrent or chronic cough, only one (GERD)  can actually contribute to/ trigger  the other two (asthma and post nasal drip syndrome)  and perpetuate the cylce of cough.  While not intuitively obvious, many patients with chronic low grade reflux do not cough until there is a primary insult that disturbs the protective epithelial barrier and exposes sensitive nerve endings.   This is typically viral but can due to PNDS and  either may apply here.      >>> The point is that once this occurs, it is difficult to eliminate the cycle  using anything but a maximally effective acid suppression regimen at least in the short  run, accompanied by an appropriate diet to address non acid GERD and control / eliminate the cough itself for at least 3 days with hycodan and pred x 6 days in case any of the upper airway symptoms are T 2 driven   DOE (dyspnea on exertion) 05/18/2018   Walked RA  2 laps @ 210ft each @ fast pace  stopped due to  End of study, no desats, some cough > sob   Does not need further w/u until cough is addressed   >> f/u in 2 week with all meds in hand using a trust but verify approach to confirm accurate Medication  Reconciliation The principal here is that until we are certain that the  patients are doing what we've asked, it makes no sense to  ask them to do more.     Total time devoted to counseling  > 50 % of initial 60 min office visit:  review case with pt/ discussion of options/alternatives/ personally creating written customized instructions  in presence of pt  then going over those specific  Instructions directly with the pt including how to use all of the meds but in particular covering each new medication in detail and the difference between the maintenance= "automatic" meds and the prns using an action plan format for the latter (If this problem/symptom => do that organization reading Left to right).  Please see AVS from this visit for a full list of these instructions which I personally wrote for this pt and  are unique to this visit.      Sandrea Hughs, MD 05/18/2018

## 2018-05-18 NOTE — Assessment & Plan Note (Signed)
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   These are the same pts (now being labeled as having "irritable larynx syndrome" by some cough centers) who not infrequently have a history of having failed to tolerate ace inhibitors,  dry powder inhalers or biphosphonates or report having atypical/extraesophageal reflux symptoms that don't respond to standard doses of PPI  and are easily confused as having aecopd or asthma flares by even experienced allergists/ pulmonologists (myself included).   Of the three most common causes of  Sub-acute / recurrent or chronic cough, only one (GERD)  can actually contribute to/ trigger  the other two (asthma and post nasal drip syndrome)  and perpetuate the cylce of cough.  While not intuitively obvious, many patients with chronic low grade reflux do not cough until there is a primary insult that disturbs the protective epithelial barrier and exposes sensitive nerve endings.   This is typically viral but can due to PNDS and  either may apply here.      >>> The point is that once this occurs, it is difficult to eliminate the cycle  using anything but a maximally effective acid suppression regimen at least in the short run, accompanied by an appropriate diet to address non acid GERD and control / eliminate the cough itself for at least 3 days with hycodan and pred x 6 days in case any of the upper  airway symptoms are T 2 driven

## 2018-05-18 NOTE — Patient Instructions (Addendum)
Start protonix 40 mg Take 30-60 min before first meal of the day and take the prilosec 20 mg 30 min  before supper   GERD (REFLUX)  is an extremely common cause of respiratory symptoms just like yours , many times with no obvious heartburn at all.    It can be treated with medication, but also with lifestyle changes including elevation of the head of your bed (ideally with 6 inch  bed blocks),  Smoking cessation, avoidance of late meals, excessive alcohol, and avoid fatty foods, chocolate, peppermint, colas, red wine, and acidic juices such as orange juice.  NO MINT OR MENTHOL PRODUCTS SO NO COUGH DROPS   USE SUGARLESS CANDY INSTEAD (Jolley ranchers or Stover's or Life Savers) or even ice chips will also do - the key is to swallow to prevent all throat clearing. NO OIL BASED VITAMINS - use powdered substitutes.   Prednisone 10 mg take  4 each am x 2 days,   2 each am x 2 days,  1 each am x 2 days and stop    The key to effective treatment for your cough is eliminating the non-stop cycle of cough you're stuck in long enough to let your airway heal completely and then see if there is anything still making you cough once you stop the cough suppression, but this should take no more than 5 days to figure out  First take hycodan up to 1 tsp every  4 hours to suppress even the urge to cough at all or even clear your throat. Swallowing water or using ice chips/non mint and menthol containing candies (such as lifesavers or sugarless jolly ranchers) are also effective.  You should rest your voice and avoid activities that you know make you cough.  Please schedule a follow up office visit in 2  weeks, sooner if needed  with all medications /inhalers/ solutions in hand so we can verify exactly what you are taking. This includes all medications from all doctors and over the counters

## 2018-06-02 ENCOUNTER — Ambulatory Visit: Payer: Medicare Other | Admitting: Internal Medicine

## 2018-06-02 ENCOUNTER — Encounter: Payer: Self-pay | Admitting: Internal Medicine

## 2018-06-02 ENCOUNTER — Ambulatory Visit (INDEPENDENT_AMBULATORY_CARE_PROVIDER_SITE_OTHER)
Admission: RE | Admit: 2018-06-02 | Discharge: 2018-06-02 | Disposition: A | Discharge status: Home / Self Care | Payer: Medicare Other | Source: Ambulatory Visit | Attending: Internal Medicine | Admitting: Internal Medicine

## 2018-06-02 VITALS — BP 128/60 | HR 71 | Ht 65.0 in | Wt 139.0 lb

## 2018-06-02 DIAGNOSIS — R0609 Other forms of dyspnea: Secondary | ICD-10-CM

## 2018-06-02 DIAGNOSIS — R05 Cough: Secondary | ICD-10-CM

## 2018-06-02 DIAGNOSIS — R9389 Abnormal findings on diagnostic imaging of other specified body structures: Secondary | ICD-10-CM

## 2018-06-02 DIAGNOSIS — R053 Chronic cough: Secondary | ICD-10-CM

## 2018-06-02 LAB — CBC WITH DIFFERENTIAL/PLATELET
BASOS PCT: 0.4 % (ref 0.0–3.0)
Basophils Absolute: 0 10*3/uL (ref 0.0–0.1)
EOS ABS: 0.1 10*3/uL (ref 0.0–0.7)
EOS PCT: 1 % (ref 0.0–5.0)
HCT: 36.7 % (ref 36.0–46.0)
Hemoglobin: 12.3 g/dL (ref 12.0–15.0)
Lymphocytes Relative: 14.3 % (ref 12.0–46.0)
Lymphs Abs: 1.4 10*3/uL (ref 0.7–4.0)
MCHC: 33.4 g/dL (ref 30.0–36.0)
MCV: 92.9 fl (ref 78.0–100.0)
MONO ABS: 0.7 10*3/uL (ref 0.1–1.0)
Monocytes Relative: 7.8 % (ref 3.0–12.0)
Neutro Abs: 7.3 10*3/uL (ref 1.4–7.7)
Neutrophils Relative %: 76.5 % (ref 43.0–77.0)
Platelets: 374 10*3/uL (ref 150.0–400.0)
RBC: 3.95 Mil/uL (ref 3.87–5.11)
RDW: 13.2 % (ref 11.5–15.5)
WBC: 9.6 10*3/uL (ref 4.0–10.5)

## 2018-06-02 MED ORDER — GABAPENTIN 100 MG PO CAPS: 100.0000 mg | ORAL_CAPSULE | Freq: Three times a day (TID) | ORAL | 2 refills | Status: DC

## 2018-06-02 NOTE — Patient Instructions (Addendum)
Start gabapentin 100mg  three times a day - you gradually build this up and the cough should improved  Pantoprazole is Take 30-60 min before first meal of the day and omeprazole 20 mg is Take 30- 60 min before  last meals of the day   Take  Hycodan up to a tsp every 4 hours to suppress the urge to cough. Swallowing water and/or using ice chips/non mint and menthol containing candies (such as lifesavers or sugarless jolly ranchers) are also effective.  You should rest your voice and avoid activities that you know make you cough.  Once you have eliminated the cough for 3 straight days try off the hycodan    Please remember to go to the lab and x-ray department    for your tests - we will call you with the results when they are available.   Please schedule a follow up office visit in 4 weeks, sooner if needed  with all medications /inhalers/ solutions in hand so we can verify exactly what you are taking. This includes all medications from all doctors and over the counters

## 2018-06-02 NOTE — Progress Notes (Signed)
Pamela Hughes, female    DOB: 02/10/1941,    MRN: [id number]   Brief patient profile:  28 yowf quit smoking 1976 due to cost but remembers episodes severe cough dx as bronchitis  "with colds' as child but months between illnesses less likely in summer with no need maint rx until 2015/2016 when developed persistent cough with sob worse over the same period of time so referred to pulmonary clinic 05/18/2018 by Dr   Lemar Lofty   Allergy testing around 2000 positive but not better with shots x one year    History of Present Illness  05/18/2018  Pulmonary/ 1st office eval/[facility]  Chief Complaint  Patient presents with  . Pulmonary Consult    Referred by Dr Lajuana Ripple.  Pt c/o cough "for years and years". She states she occ produces some clear to white sputum.  She is SOB with exertion such as walking accross a parking lot.   Dyspnea:  50 -100 ft  Cough: so hard vomits/otherwise min white mucus  Sleep: better when sleeping and p 5 min of stirring in am  SABA use: not much help Takes prilosec pc and lots of cough drops/ saba doesn't help Does not recall if every took pred x > 5 days  Chest discomfort on R anteriorly just with coughing fits    Kouffman Reflux v Neurogenic Cough Differentiator Reflux Comments  Do you awaken from a sound sleep coughing violently?                            With trouble breathing? Not often   Do you have choking episodes when you cannot  Get enough air, gasping for air ?              Yes    Do you usually cough when you lie down into  The bed, or when you just lie down to rest ?                          Yes   Do you usually cough after meals or eating?         no   Do you cough when (or after) you bend over?    Yes   GERD SCORE     Kouffman Reflux v Neurogenic Cough Differentiator Neurogenic   Do you more-or-less cough all day long? Sporadic    Does change of temperature make you cough? no   Does laughing or chuckling cause you to cough? maybe   Do fumes  (perfume, automobile fumes, burned  Toast, etc.,) cause you to cough ?      Yes    Does speaking, singing, or talking on the phone cause you to cough   ?               No    Neurogenic/Airway score      rec Start protonix 40 mg Take 30-60 min before first meal of the day and take the prilosec 20 mg 30 min  before supper  GERD diet   Prednisone 10 mg x 6 days First take hycodan up to 1 tsp every  4 hours to suppress even the urge to cough at all or even clear your throat. Swallowing water or using ice chips/non mint and menthol containing candies (such as lifesavers or sugarless jolly ranchers) are also effective.  You should rest your voice and avoid activities that you  know make you cough. Please schedule a follow up office visit in 2  weeks, sooner if needed  with all medications /inhalers/ solutions in hand so we can verify exactly what you are taking. This includes all medications from all doctors and over the counters   06/02/2018  f/u ov/[facility] re: sob/cough/ brought some but not all meds and confused with details fo care  eg maint vs prns, generic vs trade names   Chief Complaint  Patient presents with  . Follow-up    cough is some better. Her breathing is unchanged. She rarely uses her albuterol inhaler.   Dyspnea:  At rest and walking are the same  Cough: p stirs x 30 - 40 min / takes hycodan each am instead of prn  as directed - if doesn't take the hycodan continues to cough same pattern Sleeping: fine at hs  SABA use: not helpful  02: none Watery nasal discharge  No obvious day to day or daytime variability or assoc excess/ purulent sputum or mucus plugs or hemoptysis or cp or chest tightness, subjective wheeze or overt sinus or hb symptoms.   Sleep  without nocturnal    exacerbation  of respiratory  c/o's or need for noct saba. Also denies any obvious fluctuation of symptoms with weather or environmental changes or other aggravating or alleviating factors except as outlined above     No unusual exposure hx or h/o childhood pna/ asthma or knowledge of premature birth.  Current Allergies, Complete Past Medical History, Past Surgical History, Family History, and Social History were reviewed in [employer] record.  ROS  The following are not active complaints unless bolded Hoarseness, sore throat, dysphagia= globus sensation, dental problems, itching, sneezing,  nasal congestion or discharge of excess mucus or purulent secretions, ear ache,   fever, chills, sweats, unintended wt loss or wt gain, classically pleuritic or exertional cp,  orthopnea pnd or arm/hand swelling  or leg swelling, presyncope, palpitations, abdominal pain, anorexia, nausea, vomiting, diarrhea  or change in bowel habits or change in bladder habits, change in stools or change in urine, dysuria, hematuria,  rash, arthralgias, visual complaints, headache, numbness, weakness or ataxia or problems with walking or coordination,  change in mood or  memory.        Current Meds  Medication Sig  . albuterol (PROVENTIL HFA;VENTOLIN HFA) 108 (90 Base) MCG/ACT inhaler Inhale 2 puffs into the lungs every 6 (six) hours as needed for wheezing or shortness of breath.  Marland Kitchen buPROPion (WELLBUTRIN SR) 150 MG 12 hr tablet Take 1 tablet (150 mg total) by mouth 2 (two) times daily.  . chlorthalidone (HYGROTON) 25 MG tablet TAKE ONE (1) TABLET EACH DAY  . HYDROcodone-homatropine (HYCODAN) 5-1.5 MG/5ML syrup Take 5 mLs by mouth every 6 (six) hours as needed for cough.  . MELOXICAM PO Take 1 tablet by mouth daily as needed.  Marland Kitchen omeprazole (PRILOSEC) 20 MG capsule TAKE ONE (1) CAPSULE EACH DAY  . pantoprazole (PROTONIX) 40 MG tablet Take 1 tablet (40 mg total) by mouth daily. Take 30-60 min before first meal of the day  . rosuvastatin (CRESTOR) 10 MG tablet Take 1 tablet (10 mg total) by mouth daily.  . traZODone (DESYREL) 150 MG tablet TAKE ONE TABLET DAILY AT BEDTIME           Objective:      amb wf  nad  Wt Readings from Last 3 Encounters:  06/02/18 139 lb (63 kg)  05/18/18 139 lb (63 kg)  03/31/18 142 lb (64.4 kg)     Vital signs reviewed - Note on arrival 02 sats  97% on RA      HEENT:  Top denture/ lower parital plate, mild/mod swelling turbinates bilaterally, and oropharynx. Nl external ear canals without cough reflex   NECK :  without JVD/Nodes/TM/ nl carotid upstrokes bilaterally   LUNGS: no acc muscle use,  Nl contour chest which is clear to A and P bilaterally without cough on insp or exp maneuvers   CV:  RRR  no s3 or murmur or increase in P2, and no edema   ABD:  soft and nontender with nl inspiratory excursion in the supine position. No bruits or organomegaly appreciated, bowel sounds nl  MS:  Nl gait/ ext warm without deformities, calf tenderness, cyanosis or clubbing No obvious joint restrictions   SKIN: warm and dry without lesions    NEURO:  alert, approp, nl sensorium with  no motor or cerebellar deficits apparent.    Labs ordered 06/02/2018  Allergy profile  CXR PA and Lateral:   06/02/2018 :    I personally reviewed images and agree with radiology impression as follows:   Coarse perihilar interstitial opacities, increased since previous Exam. My impression: markings are coarse and non-specific       Assessment

## 2018-06-03 ENCOUNTER — Encounter: Payer: Self-pay | Admitting: Internal Medicine

## 2018-06-03 DIAGNOSIS — R9389 Abnormal findings on diagnostic imaging of other specified body structures: Secondary | ICD-10-CM | POA: Insufficient documentation

## 2018-06-03 NOTE — Assessment & Plan Note (Addendum)
05/18/2018   Walked RA  2 laps @ 266ft each @ fast pace  stopped due to  End of study, no desats, some cough > sob    HRCT next step > ordered

## 2018-06-03 NOTE — Assessment & Plan Note (Signed)
Changes 06/02/2018 are non-specific and no cough on inspiration or desats walking 05/18/18  - HRCT Chest rec on return to complete the w/u

## 2018-06-03 NOTE — Assessment & Plan Note (Addendum)
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   These are the same pts (now being labeled as having "irritable larynx syndrome" by some cough centers) who not infrequently have a history of having failed to tolerate ace inhibitors,  dry powder inhalers or biphosphonates or report having atypical/extraesophageal reflux symptoms that don't respond to standard doses of PPI  and are easily confused as having aecopd or asthma flares by even experienced allergists/ pulmonologists (myself included).   All she's responded to to date = cough suppression but misunderstood how to use this.  Advised: The standardized cough guidelines published in Chest by [place] in 2006 are still the best available and consist of a multiple step process (up to 12!) , not a single office visit,  and are intended  to address this problem logically,  with an alogrithm dependent on response to empiric treatment at  each progressive step  to determine a specific diagnosis with  minimal addtional testing needed. Therefore if adherence is an issue or can't be accurately verified,  it's very unlikely the standard evaluation and treatment will be successful here.    Furthermore, response to therapy (other than acute cough suppression, which  should only be used short term with avoidance of narcotic containing cough syrups if possible), can be a gradual process for which the patient is not likely to  perceive immediate benefit.  Unlike going to an eye doctor where the best perscription is almost always the first one and is immediately effective, this is almost never the case in the management of chronic cough syndromes. Therefore the patient needs to commit up front to consistently adhere to recommendations  for up to 6 weeks of therapy directed at the likely underlying problem(s) before the response can be reasonably evaluated.    rec 1) trial of gabapentin 100 tid for globus sensation and to see if can control cough s narcotics 2) max acid suppression / gerd diet  3) if she uses hycodan at all, it should be x 3 days of suppression x 24/7 to interrupt cyclical cough, not to use "a tsp daily"  4) CT sinus next step    F/u in 4 weeks with all meds in hand using a trust but verify approach to confirm accurate Medication  Reconciliation The principal here is that until we are certain that the  patients are doing what we've asked, it makes no sense to ask them to do more.

## 2018-06-05 ENCOUNTER — Telehealth: Payer: Self-pay | Admitting: *Deleted

## 2018-06-05 DIAGNOSIS — R059 Cough, unspecified: Secondary | ICD-10-CM

## 2018-06-05 DIAGNOSIS — R05 Cough: Secondary | ICD-10-CM

## 2018-06-05 DIAGNOSIS — R053 Chronic cough: Secondary | ICD-10-CM

## 2018-06-05 LAB — RESPIRATORY ALLERGY PROFILE REGION II ~~LOC~~
Allergen, A. alternata, m6: <0.1 kU/L
Allergen, Cedar tree, t12: <0.1 kU/L
Allergen, Comm Silver Birch, t9: <0.1 kU/L
Allergen, Cottonwood, t14: <0.1 kU/L
Allergen, Mouse Urine Protein, e78: <0.1 kU/L
Allergen, Mulberry, t76: <0.1 kU/L
Allergen, Oak,t7: <0.1 kU/L
Allergen, P. notatum, m1: <0.1 kU/L
Aspergillus fumigatus, m3: <0.1 kU/L
Bermuda Grass: <0.1 kU/L
Box Elder IgE: <0.1 kU/L
CLADOSPORIUM HERBARUM (M2) IGE: <0.1 kU/L
CLASS: 0
CLASS: 0
COMMON RAGWEED (SHORT) (W1) IGE: <0.1 kU/L
Cat Dander: <0.1 kU/L
Class: 0
Class: 0
Class: 0
Class: 0
Class: 0
Class: 0
Class: 0
Class: 0
Class: 0
Class: 0
Class: 0
Class: 0
Class: 0
Class: 0
Class: 0
Class: 0
Class: 0
Class: 0
Class: 0
Class: 0
Class: 0
Class: 0
Cockroach: <0.1 kU/L
D. farinae: <0.1 kU/L
Dog Dander: <0.1 kU/L
Elm IgE: <0.1 kU/L
IgE (Immunoglobulin E), Serum: 218 kU/L — ABNORMAL HIGH (ref <=114)
Johnson Grass: <0.1 kU/L
Pecan/Hickory Tree IgE: <0.1 kU/L
Rough Pigweed  IgE: <0.1 kU/L
Sheep Sorrel IgE: <0.1 kU/L
Timothy Grass: <0.1 kU/L

## 2018-06-05 LAB — INTERPRETATION:

## 2018-06-05 NOTE — Telephone Encounter (Signed)
Attempted to call pt but unable to reach her and unable to leave a VM. Will try to call back later. 

## 2018-06-05 NOTE — Telephone Encounter (Signed)
LMTCB

## 2018-06-05 NOTE — Telephone Encounter (Signed)
Pt is calling back [phone]

## 2018-06-05 NOTE — Telephone Encounter (Signed)
-----   Message from Nyoka Cowden, MD sent at 06/03/2018  2:05 PM EST ----- Needs hrct chest and sinus CT limited dx cough

## 2018-06-06 NOTE — Telephone Encounter (Signed)
Spoke with the pt and notified of recs per MW  She verbalized understanding and orders for cts placed

## 2018-06-06 NOTE — Progress Notes (Signed)
Spoke with pt and notified of results per Dr. Wert. Pt verbalized understanding and denied any questions. 

## 2018-06-13 ENCOUNTER — Other Ambulatory Visit: Payer: Self-pay | Admitting: Family Medicine

## 2018-06-14 NOTE — Telephone Encounter (Signed)
Last seen 03/21/18

## 2018-06-20 ENCOUNTER — Ambulatory Visit (HOSPITAL_COMMUNITY)
Admission: RE | Admit: 2018-06-20 | Discharge: 2018-06-20 | Disposition: A | Discharge status: Home / Self Care | Payer: Medicare Other | Source: Ambulatory Visit | Attending: Internal Medicine | Admitting: Internal Medicine

## 2018-06-20 DIAGNOSIS — R053 Chronic cough: Secondary | ICD-10-CM

## 2018-06-20 DIAGNOSIS — R05 Cough: Secondary | ICD-10-CM | POA: Insufficient documentation

## 2018-06-20 DIAGNOSIS — R918 Other nonspecific abnormal finding of lung field: Secondary | ICD-10-CM | POA: Diagnosis not present

## 2018-06-20 DIAGNOSIS — R059 Cough, unspecified: Secondary | ICD-10-CM

## 2018-06-21 NOTE — Progress Notes (Signed)
Spoke with pt and notified of results per Dr. Wert. Pt verbalized understanding and denied any questions. 

## 2018-06-30 ENCOUNTER — Ambulatory Visit: Payer: Medicare Other | Admitting: Internal Medicine

## 2018-06-30 ENCOUNTER — Encounter: Payer: Self-pay | Admitting: Internal Medicine

## 2018-06-30 VITALS — BP 150/78 | HR 66 | Ht 66.0 in | Wt 140.4 lb

## 2018-06-30 DIAGNOSIS — R9389 Abnormal findings on diagnostic imaging of other specified body structures: Secondary | ICD-10-CM

## 2018-06-30 DIAGNOSIS — R05 Cough: Secondary | ICD-10-CM

## 2018-06-30 DIAGNOSIS — R053 Chronic cough: Secondary | ICD-10-CM

## 2018-06-30 NOTE — Assessment & Plan Note (Addendum)
Changes 06/02/2018 1st noted   - HRCT Chest 06/20/18   1. Negative for interstitial lung disease. No pulmonary parenchymal findings to explain the patient's symptoms. 2. Pulmonary nodules measure up to 7 mm. Non-contrast chest CT at  3-6 months is recommended.  CT results reviewed with pt >>> Too small for PET or bx, not suspicious enough for excisional bx > really only option for now is follow the Fleischner society guidelines as rec by radiology. She is out > 15 y from smoking so low but not no risk > rec 6 m restudy > placed in reminder file for 12/19/18   Discussed in detail all the  indications, usual  risks and alternatives  relative to the benefits with patient who agrees to proceed with conservative f/u as outlined

## 2018-06-30 NOTE — Patient Instructions (Addendum)
No change in medications   Please schedule a follow up office visit in 2 months  sooner if needed  with all medications /inhalers/ solutions in hand so we can verify exactly what you are taking. This includes all medications from all doctors and over the counters - remind her re f/u ct due 12/19/18

## 2018-06-30 NOTE — Assessment & Plan Note (Signed)
Onset around 2015  - FENO 05/18/2018  =  5 - Spirometry 05/18/2018  FEV1 1.7 (81%)  Ratio 75 s curvature off all rx - Allergy profile 06/02/2018 >  Eos 0.1 /  IgE  218 RAST neg  - trial of gabapentin 100 tid 06/02/2018 for globus/ throat clearing  - sinus ct 06/20/18 Clear paranasal sinuses  Cough finally improving s use of narcotic cough meds c/w Upper airway cough syndrome (previously labeled PNDS),  is so named because it's frequently impossible to sort out how much is  CR/sinusitis with freq throat clearing (which can be related to primary GERD)   vs  causing  secondary (" extra esophageal")  GERD from wide swings in gastric pressure that occur with throat clearing, often  promoting self use of mint and menthol lozenges that reduce the lower esophageal sphincter tone and exacerbate the problem further in a cyclical fashion.   These are the same pts (now being labeled as having "irritable larynx syndrome" by some cough centers) who not infrequently have a history of having failed to tolerate ace inhibitors,  dry powder inhalers or biphosphonates or report having atypical/extraesophageal reflux symptoms that don't respond to standard doses of PPI  and are easily confused as having aecopd or asthma flares by even experienced allergists/ pulmonologists (myself included).    rec continue gabapentin 100 mg tid and max rx for gerd including elevated hob    I had an extended discussion with the patient reviewing all relevant studies completed to date and  lasting 15 to 20 minutes of a 25 minute visit    Each maintenance medication was reviewed in detail including most importantly the difference between maintenance and prns and under what circumstances the prns are to be triggered using an action plan format that is not reflected in the computer generated alphabetically organized AVS.     Please see AVS for specific instructions unique to this visit that I personally wrote and verbalized to the the pt in  detail and then reviewed with pt  by my nurse highlighting any  changes in therapy recommended at today's visit to their plan of care.

## 2018-06-30 NOTE — Progress Notes (Signed)
Pamela Hughes, female    DOB: 02/10/1941,    MRN: [id number]   Brief patient profile:  28 yowf quit smoking 1976 due to cost but remembers episodes severe cough dx as bronchitis  "with colds' as child but months between illnesses less likely in summer with no need maint rx until 2015/2016 when developed persistent cough with sob worse over the same period of time so referred to pulmonary clinic 05/18/2018 by Dr   Lemar Lofty   Allergy testing around 2000 positive but not better with shots x one year    History of Present Illness  05/18/2018  Pulmonary/ 1st office eval/Aizley Stenseth  Chief Complaint  Patient presents with  . Pulmonary Consult    Referred by Dr Lajuana Ripple.  Pt c/o cough "for years and years". She states she occ produces some clear to white sputum.  She is SOB with exertion such as walking accross a parking lot.   Dyspnea:  50 -100 ft  Cough: so hard vomits/otherwise min white mucus  Sleep: better when sleeping and p 5 min of stirring in am  SABA use: not much help Takes prilosec pc and lots of cough drops/ saba doesn't help Does not recall if every took pred x > 5 days  Chest discomfort on R anteriorly just with coughing fits    Kouffman Reflux v Neurogenic Cough Differentiator Reflux Comments  Do you awaken from a sound sleep coughing violently?                            With trouble breathing? Not often   Do you have choking episodes when you cannot  Get enough air, gasping for air ?              Yes    Do you usually cough when you lie down into  The bed, or when you just lie down to rest ?                          Yes   Do you usually cough after meals or eating?         no   Do you cough when (or after) you bend over?    Yes   GERD SCORE     Kouffman Reflux v Neurogenic Cough Differentiator Neurogenic   Do you more-or-less cough all day long? Sporadic    Does change of temperature make you cough? no   Does laughing or chuckling cause you to cough? maybe   Do fumes  (perfume, automobile fumes, burned  Toast, etc.,) cause you to cough ?      Yes    Does speaking, singing, or talking on the phone cause you to cough   ?               No    Neurogenic/Airway score      rec Start protonix 40 mg Take 30-60 min before first meal of the day and take the prilosec 20 mg 30 min  before supper  GERD diet   Prednisone 10 mg x 6 days First take hycodan up to 1 tsp every  4 hours to suppress even the urge to cough at all or even clear your throat. Swallowing water or using ice chips/non mint and menthol containing candies (such as lifesavers or sugarless jolly ranchers) are also effective.  You should rest your voice and avoid activities that you  know make you cough. Please schedule a follow up office visit in 2  weeks, sooner if needed  with all medications /inhalers/ solutions in hand so we can verify exactly what you are taking. This includes all medications from all doctors and over the counters   06/02/2018  f/u ov/Pamela Hughes re: sob/cough/ brought some but not all meds and confused with details fo care  eg maint vs prns, generic vs trade names   Chief Complaint  Patient presents with  . Follow-up    cough is some better. Her breathing is unchanged. She rarely uses her albuterol inhaler.   Dyspnea:  At rest and walking are the same  Cough: p stirs x 30 - 40 min / takes hycodan each am instead of prn  as directed - if doesn't take the hycodan continues to cough same pattern Sleeping: fine at hs  SABA use: not helpful  02: none Watery nasal discharge rec Start gabapentin 100mg  three times a day - you gradually build this up and the cough should improved Pantoprazole is Take 30-60 min before first meal of the day and omeprazole 20 mg is Take 30- 60 min before  last meals of the day  Take  Hycodan up to a tsp every 4 hours to suppress the urge to cough. Swallowing water and/or using ice chips/non mint and menthol containing candies (such as lifesavers or sugarless jolly  ranchers) are also effective.  You should rest your voice and avoid activities that you know make you cough. Once you have eliminated the cough for 3 straight days try off the hycodan  Please remember to go to the lab and x-ray department    for your tests - we will call you with the results when they are available. Please schedule a follow up office visit in 4 weeks, sooner if needed  with all medications /inhalers/ solutions in hand so we can verify exactly what you are taking. This includes all medications from all doctors and over the counters      06/30/2018  f/u ov/Pamela Hughes re:   uacs improved on gabapentin, no longer on hycodan- did  Not bring meds " I know them all and there are no changes"  (note this is not the case at all, wanted refills on specific meds but doesn't know which ones_) Chief Complaint  Patient presents with  . Follow-up    Cough had started to improve but then worsened a wk ago- prod with clear to yellow sputum.  She has minimal SOB. She has not been using her albuterol inhaler.   Dyspnea:  Not limited by breathing from desired activities  / stationary bike  Cough: better until caught "typical heal cold " one week prior to OV  > improving  Sleeping: block of wood under bed "a couple of inches  SABA use: none 02:  None   No obvious day to day or daytime variability or assoc mucus plugs or hemoptysis or cp or chest tightness, subjective wheeze or overt sinus or hb symptoms.   Sleeping as above  without nocturnal  or early am exacerbation  of respiratory  c/o's or need for noct saba. Also denies any obvious fluctuation of symptoms with weather or environmental changes or other aggravating or alleviating factors except as outlined above   No unusual exposure hx or h/o childhood pna/ asthma or knowledge of premature birth.  Current Allergies, Complete Past Medical History, Past Surgical History, Family History, and Social History were reviewed in [facility]  electronic  medical record.  ROS  The following are not active complaints unless bolded Hoarseness, sore throat, dysphagia, dental problems, itching, sneezing,  nasal congestion or discharge of excess mucus or purulent secretions, ear ache,   fever, chills, sweats, unintended wt loss or wt gain, classically pleuritic or exertional cp,  orthopnea pnd or arm/hand swelling  or leg swelling, presyncope, palpitations, abdominal pain, anorexia, nausea, vomiting, diarrhea  or change in bowel habits or change in bladder habits, change in stools or change in urine, dysuria, hematuria,  rash, arthralgias, visual complaints, headache, numbness, weakness or ataxia or problems with walking or coordination,  change in mood or  memory.        Current Meds  Medication Sig  . albuterol (PROVENTIL HFA;VENTOLIN HFA) 108 (90 Base) MCG/ACT inhaler Inhale 2 puffs into the lungs every 6 (six) hours as needed for wheezing or shortness of breath.  Marland Kitchen buPROPion (WELLBUTRIN SR) 150 MG 12 hr tablet Take 1 tablet (150 mg total) by mouth 2 (two) times daily.  . chlorthalidone (HYGROTON) 25 MG tablet TAKE ONE (1) TABLET EACH DAY  . clotrimazole (MYCELEX) 10 MG troche DISSOLVE 1 TABLET IN MOUTH 5 TIMES DAILY  . gabapentin (NEURONTIN) 100 MG capsule Take 1 capsule (100 mg total) by mouth 3 (three) times daily. One three times daily  . MELOXICAM PO Take 1 tablet by mouth daily as needed.  Marland Kitchen omeprazole (PRILOSEC) 20 MG capsule TAKE ONE (1) CAPSULE EACH DAY  . pantoprazole (PROTONIX) 40 MG tablet Take 1 tablet (40 mg total) by mouth daily. Take 30-60 min before first meal of the day  . rosuvastatin (CRESTOR) 10 MG tablet Take 1 tablet (10 mg total) by mouth daily.  . traZODone (DESYREL) 150 MG tablet TAKE ONE TABLET DAILY AT BEDTIME             Objective:     amb wf nad / occ throat clearing   06/30/2018        140   06/02/18 139 lb (63 kg)  05/18/18 139 lb (63 kg)  03/31/18 142 lb (64.4 kg)     Vital signs reviewed - Note on  arrival 02 sats  98% on RA        HEENT: Upper denture/ lower partial plate/ min swelling  turbinates bilaterally, and oropharynx. Nl external ear canals without cough reflex   NECK :  without JVD/Nodes/TM/ nl carotid upstrokes bilaterally   LUNGS: no acc muscle use,  Nl contour chest which is clear to A and P bilaterally without cough on insp or exp maneuvers   CV:  RRR  no s3 or murmur or increase in P2, and no edema   ABD:  soft and nontender with nl inspiratory excursion in the supine position. No bruits or organomegaly appreciated, bowel sounds nl  MS:  Nl gait/ ext warm without deformities, calf tenderness, cyanosis or clubbing No obvious joint restrictions   SKIN: warm and dry without lesions    NEURO:  alert, approp, nl sensorium with  no motor or cerebellar deficits apparent.      I personally reviewed images and agree with radiology impression as follows:   Chest CT 06/20/18 - HRCT Chest 06/20/18   1. Negative for interstitial lung disease. No pulmonary parenchymal findings to explain the patient's symptoms. 2. Pulmonary nodules measure up to 7 mm. Non-contrast chest CT at  3-6 months is recommended.            Assessment

## 2018-07-11 ENCOUNTER — Other Ambulatory Visit: Payer: Self-pay | Admitting: Family Medicine

## 2018-07-12 NOTE — Telephone Encounter (Signed)
Last seen 03/21/18  Dr Dettinger

## 2018-08-16 ENCOUNTER — Telehealth: Payer: Self-pay | Admitting: Internal Medicine

## 2018-08-16 MED ORDER — GABAPENTIN 100 MG PO CAPS: 100.0000 mg | ORAL_CAPSULE | Freq: Three times a day (TID) | ORAL | 2 refills | Status: DC

## 2018-08-16 MED ORDER — PANTOPRAZOLE SODIUM 40 MG PO TBEC: 40.0000 mg | DELAYED_RELEASE_TABLET | Freq: Every day | ORAL | 2 refills | Status: DC

## 2018-08-16 NOTE — Telephone Encounter (Signed)
ATC patient.  Left message to call back to confirm pharmacy.

## 2018-08-16 NOTE — Telephone Encounter (Signed)
Called and spoke with pt in regards to meds that she needs refill of. Stated to pt that the pharmacy could have been trying to contact our old office phone/fax number and that could be why they had not heard from them. Pt expressed understanding. I verified the correct pharmacy and have sent refills in for pt. Nothing further needed.

## 2018-08-16 NOTE — Telephone Encounter (Signed)
Pt is calling back [phone]

## 2018-08-17 ENCOUNTER — Other Ambulatory Visit: Payer: Self-pay | Admitting: Family Medicine

## 2018-08-29 ENCOUNTER — Ambulatory Visit: Payer: Medicare Other | Admitting: Internal Medicine

## 2018-09-14 ENCOUNTER — Other Ambulatory Visit: Payer: Self-pay | Admitting: Family Medicine

## 2018-09-14 ENCOUNTER — Other Ambulatory Visit: Payer: Self-pay | Admitting: Nurse Practitioner

## 2018-09-19 ENCOUNTER — Other Ambulatory Visit: Payer: Self-pay

## 2018-09-19 ENCOUNTER — Encounter: Payer: Self-pay | Admitting: Nurse Practitioner

## 2018-09-19 ENCOUNTER — Ambulatory Visit (INDEPENDENT_AMBULATORY_CARE_PROVIDER_SITE_OTHER): Payer: Medicare Other | Admitting: Nurse Practitioner

## 2018-09-19 DIAGNOSIS — R42 Dizziness and giddiness: Secondary | ICD-10-CM

## 2018-09-19 DIAGNOSIS — R51 Headache: Secondary | ICD-10-CM

## 2018-09-19 DIAGNOSIS — R519 Headache, unspecified: Secondary | ICD-10-CM

## 2018-09-19 DIAGNOSIS — J0110 Acute frontal sinusitis, unspecified: Secondary | ICD-10-CM | POA: Diagnosis not present

## 2018-09-19 MED ORDER — AMOXICILLIN-POT CLAVULANATE 875-125 MG PO TABS: 1.0000 | ORAL_TABLET | Freq: Two times a day (BID) | ORAL | 0 refills | Status: DC

## 2018-09-19 MED ORDER — SCOPOLAMINE 1 MG/3DAYS TD PT72: 1.0000 | MEDICATED_PATCH | TRANSDERMAL | 6 refills | Status: DC

## 2018-09-19 NOTE — Progress Notes (Signed)
Virtual Visit via telephone Note  I connected with Pamela Hughes on 09/19/18 at 11:45AM by telephone and verified that I am speaking with the correct person using two identifiers. Pamela Hughes is currently located at home and Pamela Hughes her cousin is currently with her during visit. The provider, Mary-Margaret Daphine Deutscher, FNP is located in their office at time of visit.  I discussed the limitations, risks, security and privacy concerns of performing an evaluation and management service by telephone and the availability of in person appointments. I also discussed with the patient that there may be a patient responsible charge related to this service. The patient expressed understanding and agreed to proceed.   History and Present Illness:   Chief Complaint: Dizziness   HPI  Patient calls in today c/o dizziness that has caused her to fall 3x. She denies injurying herself. She said headache started 9 days ago when all of this congestion started. She denies sore throat or fever. Slight nonproductive cough.    Review of Systems  Constitutional: Negative for chills and fever.  HENT: Positive for congestion and sinus pain. Negative for ear pain and sore throat.   Respiratory: Positive for cough (slight).   Cardiovascular: Negative.   Genitourinary: Negative.   Musculoskeletal: Negative.   Neurological: Positive for dizziness and headaches.  Psychiatric/Behavioral: Negative.      Observations/Objective: Alert and oriented- answers all questions appropriately No distress in voice  Assessment and Plan: Pamela Hughes in today with chief complaint of Dizziness   1. Dizziness Fall precautions - scopolamine (TRANSDERM-SCOP, 1.5 MG,) 1 MG/3DAYS; Place 1 patch (1.5 mg total) onto the skin every 3 (three) days.  Dispense: 4 patch; Refill: 6  2. Acute frontal sinusitis, recurrence not specified 1. Take meds as prescribed 2. Use a cool mist humidifier especially during the winter months and when  heat has been humid. 3. Use saline nose sprays frequently 4. Saline irrigations of the nose can be very helpful if done frequently.  * 4X daily for 1 week*  * Use of a nettie pot can be helpful with this. Follow directions with this* 5. Drink plenty of fluids 6. Keep thermostat turn down low 7.For any cough or congestion  Use plain Mucinex- regular strength or max strength is fine   * Children- consult with Pharmacist for dosing 8. For fever or aces or pains- take tylenol or ibuprofen appropriate for age and weight.  * for fevers greater than 101 orally you may alternate ibuprofen and tylenol every  3 hours.     - amoxicillin-clavulanate (AUGMENTIN) 875-125 MG tablet; Take 1 tablet by mouth 2 (two) times daily.  Dispense: 14 tablet; Refill: 0  3. Acute nonintractable headache, unspecified headache type advil OTC for headache   Follow Up Instructions: prn    I discussed the assessment and treatment plan with the patient. The patient was provided an opportunity to ask questions and all were answered. The patient agreed with the plan and demonstrated an understanding of the instructions.   The patient was advised to call back or seek an in-person evaluation if the symptoms worsen or if the condition fails to improve as anticipated.  The above assessment and management plan was discussed with the patient. The patient verbalized understanding of and has agreed to the management plan. Patient is aware to call the clinic if symptoms persist or worsen. Patient is aware when to return to the clinic for a follow-up visit. Patient educated on when it is appropriate to  go to the emergency department.    I provided 10 minutes of non-face-to-face time during this encounter.    Mary-Margaret Daphine DeutscherMartin, FNP

## 2018-09-20 DIAGNOSIS — R410 Disorientation, unspecified: Secondary | ICD-10-CM | POA: Diagnosis not present

## 2018-09-21 ENCOUNTER — Encounter (HOSPITAL_COMMUNITY): Payer: Self-pay

## 2018-09-21 ENCOUNTER — Emergency Department (HOSPITAL_COMMUNITY): Payer: Medicare Other

## 2018-09-21 ENCOUNTER — Other Ambulatory Visit: Payer: Self-pay

## 2018-09-21 ENCOUNTER — Inpatient Hospital Stay (HOSPITAL_COMMUNITY)
Admission: EM | Admit: 2018-09-21 | Discharge: 2018-09-23 | DRG: 066 | Disposition: A | Discharge status: Home Health | Payer: Medicare Other | Attending: Internal Medicine | Admitting: Internal Medicine

## 2018-09-21 ENCOUNTER — Inpatient Hospital Stay (HOSPITAL_COMMUNITY): Payer: Medicare Other

## 2018-09-21 DIAGNOSIS — Z820 Family history of epilepsy and other diseases of the nervous system: Secondary | ICD-10-CM | POA: Diagnosis not present

## 2018-09-21 DIAGNOSIS — Z90710 Acquired absence of both cervix and uterus: Secondary | ICD-10-CM

## 2018-09-21 DIAGNOSIS — J45909 Unspecified asthma, uncomplicated: Secondary | ICD-10-CM | POA: Diagnosis present

## 2018-09-21 DIAGNOSIS — Z8719 Personal history of other diseases of the digestive system: Secondary | ICD-10-CM

## 2018-09-21 DIAGNOSIS — I129 Hypertensive chronic kidney disease with stage 1 through stage 4 chronic kidney disease, or unspecified chronic kidney disease: Secondary | ICD-10-CM | POA: Diagnosis present

## 2018-09-21 DIAGNOSIS — Z791 Long term (current) use of non-steroidal anti-inflammatories (NSAID): Secondary | ICD-10-CM

## 2018-09-21 DIAGNOSIS — K219 Gastro-esophageal reflux disease without esophagitis: Secondary | ICD-10-CM | POA: Diagnosis not present

## 2018-09-21 DIAGNOSIS — Z79899 Other long term (current) drug therapy: Secondary | ICD-10-CM | POA: Diagnosis not present

## 2018-09-21 DIAGNOSIS — I10 Essential (primary) hypertension: Secondary | ICD-10-CM | POA: Diagnosis not present

## 2018-09-21 DIAGNOSIS — Z811 Family history of alcohol abuse and dependence: Secondary | ICD-10-CM | POA: Diagnosis not present

## 2018-09-21 DIAGNOSIS — R483 Visual agnosia: Secondary | ICD-10-CM | POA: Diagnosis present

## 2018-09-21 DIAGNOSIS — H539 Unspecified visual disturbance: Secondary | ICD-10-CM | POA: Diagnosis present

## 2018-09-21 DIAGNOSIS — I63311 Cerebral infarction due to thrombosis of right middle cerebral artery: Secondary | ICD-10-CM | POA: Diagnosis not present

## 2018-09-21 DIAGNOSIS — Z7951 Long term (current) use of inhaled steroids: Secondary | ICD-10-CM | POA: Diagnosis not present

## 2018-09-21 DIAGNOSIS — I63531 Cerebral infarction due to unspecified occlusion or stenosis of right posterior cerebral artery: Secondary | ICD-10-CM | POA: Diagnosis not present

## 2018-09-21 DIAGNOSIS — Z833 Family history of diabetes mellitus: Secondary | ICD-10-CM

## 2018-09-21 DIAGNOSIS — N183 Chronic kidney disease, stage 3 (moderate): Secondary | ICD-10-CM | POA: Diagnosis present

## 2018-09-21 DIAGNOSIS — F329 Major depressive disorder, single episode, unspecified: Secondary | ICD-10-CM | POA: Diagnosis present

## 2018-09-21 DIAGNOSIS — Z87891 Personal history of nicotine dependence: Secondary | ICD-10-CM | POA: Diagnosis not present

## 2018-09-21 DIAGNOSIS — I639 Cerebral infarction, unspecified: Secondary | ICD-10-CM | POA: Diagnosis not present

## 2018-09-21 DIAGNOSIS — I351 Nonrheumatic aortic (valve) insufficiency: Secondary | ICD-10-CM | POA: Diagnosis not present

## 2018-09-21 DIAGNOSIS — I63331 Cerebral infarction due to thrombosis of right posterior cerebral artery: Secondary | ICD-10-CM | POA: Diagnosis not present

## 2018-09-21 DIAGNOSIS — R29700 NIHSS score 0: Secondary | ICD-10-CM | POA: Diagnosis present

## 2018-09-21 DIAGNOSIS — R42 Dizziness and giddiness: Secondary | ICD-10-CM | POA: Diagnosis not present

## 2018-09-21 DIAGNOSIS — R2681 Unsteadiness on feet: Secondary | ICD-10-CM | POA: Diagnosis not present

## 2018-09-21 DIAGNOSIS — Z8673 Personal history of transient ischemic attack (TIA), and cerebral infarction without residual deficits: Secondary | ICD-10-CM | POA: Diagnosis present

## 2018-09-21 LAB — URINALYSIS, ROUTINE W REFLEX MICROSCOPIC
Bilirubin Urine: NEGATIVE
Glucose, UA: NEGATIVE mg/dL
Ketones, ur: NEGATIVE mg/dL
Nitrite: NEGATIVE
Protein, ur: NEGATIVE mg/dL
Specific Gravity, Urine: 1.019 (ref 1.005–1.030)
pH: 6 (ref 5.0–8.0)

## 2018-09-21 LAB — CBC
HCT: 35.4 % — ABNORMAL LOW (ref 36.0–46.0)
Hemoglobin: 11.6 g/dL — ABNORMAL LOW (ref 12.0–15.0)
MCH: 29.9 pg (ref 26.0–34.0)
MCHC: 32.8 g/dL (ref 30.0–36.0)
MCV: 91.2 fL (ref 80.0–100.0)
Platelets: 329 10*3/uL (ref 150–400)
RBC: 3.88 MIL/uL (ref 3.87–5.11)
RDW: 12.2 % (ref 11.5–15.5)
WBC: 6.2 10*3/uL (ref 4.0–10.5)
nRBC: 0 % (ref 0.0–0.2)

## 2018-09-21 LAB — COMPREHENSIVE METABOLIC PANEL
ALT: 21 U/L (ref 0–44)
AST: 37 U/L (ref 15–41)
Albumin: 3.8 g/dL (ref 3.5–5.0)
Alkaline Phosphatase: 66 U/L (ref 38–126)
Anion gap: 11 (ref 5–15)
BUN: 23 mg/dL (ref 8–23)
CO2: 26 mmol/L (ref 22–32)
Calcium: 9 mg/dL (ref 8.9–10.3)
Chloride: 96 mmol/L — ABNORMAL LOW (ref 98–111)
Creatinine, Ser: 1.46 mg/dL — ABNORMAL HIGH (ref 0.44–1.00)
GFR calc Af Amer: 40 mL/min — ABNORMAL LOW (ref >60)
GFR calc non Af Amer: 34 mL/min — ABNORMAL LOW (ref >60)
Glucose, Bld: 126 mg/dL — ABNORMAL HIGH (ref 70–99)
Potassium: 3 mmol/L — ABNORMAL LOW (ref 3.5–5.1)
Sodium: 133 mmol/L — ABNORMAL LOW (ref 135–145)
Total Bilirubin: 0.7 mg/dL (ref 0.3–1.2)
Total Protein: 6.7 g/dL (ref 6.5–8.1)

## 2018-09-21 LAB — DIFFERENTIAL
Abs Immature Granulocytes: 0.01 10*3/uL (ref 0.00–0.07)
Basophils Absolute: 0 10*3/uL (ref 0.0–0.1)
Basophils Relative: 1 %
Eosinophils Absolute: 0.2 10*3/uL (ref 0.0–0.5)
Eosinophils Relative: 3 %
Immature Granulocytes: 0 %
Lymphocytes Relative: 19 %
Lymphs Abs: 1.1 10*3/uL (ref 0.7–4.0)
Monocytes Absolute: 0.7 10*3/uL (ref 0.1–1.0)
Monocytes Relative: 11 %
Neutro Abs: 4.1 10*3/uL (ref 1.7–7.7)
Neutrophils Relative %: 66 %

## 2018-09-21 LAB — RAPID URINE DRUG SCREEN, HOSP PERFORMED
Amphetamines: NOT DETECTED
Barbiturates: NOT DETECTED
Benzodiazepines: NOT DETECTED
Cocaine: NOT DETECTED
Opiates: NOT DETECTED
Tetrahydrocannabinol: NOT DETECTED

## 2018-09-21 LAB — I-STAT CREATININE, ED: Creatinine, Ser: 1.5 mg/dL — ABNORMAL HIGH (ref 0.44–1.00)

## 2018-09-21 LAB — APTT: aPTT: 34 seconds (ref 24–36)

## 2018-09-21 LAB — ETHANOL: Alcohol, Ethyl (B): <10 mg/dL (ref <10)

## 2018-09-21 LAB — PROTIME-INR
INR: 1 (ref 0.8–1.2)
Prothrombin Time: 13.3 seconds (ref 11.4–15.2)

## 2018-09-21 MED ORDER — ACETAMINOPHEN 160 MG/5ML PO SOLN: 650.0000 mg | ORAL | Status: DC | PRN

## 2018-09-21 MED ORDER — IOHEXOL 350 MG/ML SOLN
60.0000 mL | Freq: Once | INTRAVENOUS | Status: AC | PRN
  Administered 2018-09-21: 60 mL via INTRAVENOUS

## 2018-09-21 MED ORDER — ASPIRIN 300 MG RE SUPP: 300.0000 mg | Freq: Every day | RECTAL | Status: DC

## 2018-09-21 MED ORDER — ONDANSETRON HCL 4 MG/2ML IJ SOLN
4.0000 mg | Freq: Once | INTRAMUSCULAR | Status: AC
  Administered 2018-09-21: 4 mg via INTRAVENOUS
  Filled 2018-09-21: qty 2

## 2018-09-21 MED ORDER — SENNOSIDES-DOCUSATE SODIUM 8.6-50 MG PO TABS: 1.0000 | ORAL_TABLET | Freq: Every evening | ORAL | Status: DC | PRN

## 2018-09-21 MED ORDER — ALBUTEROL SULFATE (2.5 MG/3ML) 0.083% IN NEBU: 3.0000 mL | INHALATION_SOLUTION | Freq: Four times a day (QID) | RESPIRATORY_TRACT | Status: DC | PRN

## 2018-09-21 MED ORDER — CHLORTHALIDONE 25 MG PO TABS
25.0000 mg | ORAL_TABLET | Freq: Every day | ORAL | Status: DC
  Administered 2018-09-22 – 2018-09-23 (×2): 25 mg via ORAL
  Filled 2018-09-21 (×2): qty 1

## 2018-09-21 MED ORDER — PANTOPRAZOLE SODIUM 40 MG PO TBEC
40.0000 mg | DELAYED_RELEASE_TABLET | Freq: Every day | ORAL | Status: DC
  Administered 2018-09-21 – 2018-09-23 (×3): 40 mg via ORAL
  Filled 2018-09-21 (×3): qty 1

## 2018-09-21 MED ORDER — ENOXAPARIN SODIUM 40 MG/0.4ML ~~LOC~~ SOLN
40.0000 mg | SUBCUTANEOUS | Status: DC
  Administered 2018-09-21: 40 mg via SUBCUTANEOUS
  Filled 2018-09-21: qty 0.4

## 2018-09-21 MED ORDER — GABAPENTIN 100 MG PO CAPS
100.0000 mg | ORAL_CAPSULE | Freq: Three times a day (TID) | ORAL | Status: DC
  Administered 2018-09-21 – 2018-09-23 (×5): 100 mg via ORAL
  Filled 2018-09-21 (×5): qty 1

## 2018-09-21 MED ORDER — SODIUM CHLORIDE 0.9 % IV SOLN
INTRAVENOUS | Status: DC
  Administered 2018-09-21 – 2018-09-22 (×2): via INTRAVENOUS

## 2018-09-21 MED ORDER — ASPIRIN 325 MG PO TABS
325.0000 mg | ORAL_TABLET | Freq: Every day | ORAL | Status: DC
  Administered 2018-09-21 – 2018-09-22 (×2): 325 mg via ORAL
  Filled 2018-09-21 (×2): qty 1

## 2018-09-21 MED ORDER — TRAZODONE HCL 50 MG PO TABS
150.0000 mg | ORAL_TABLET | Freq: Every day | ORAL | Status: DC
  Administered 2018-09-21 – 2018-09-22 (×2): 150 mg via ORAL
  Filled 2018-09-21 (×2): qty 3

## 2018-09-21 MED ORDER — LOPERAMIDE HCL 2 MG PO CAPS
2.0000 mg | ORAL_CAPSULE | Freq: Four times a day (QID) | ORAL | Status: DC | PRN
  Administered 2018-09-21 – 2018-09-23 (×2): 2 mg via ORAL
  Filled 2018-09-21 (×2): qty 1
  Filled 2018-09-21: qty 2

## 2018-09-21 MED ORDER — BUPROPION HCL ER (SR) 150 MG PO TB12
150.0000 mg | ORAL_TABLET | Freq: Two times a day (BID) | ORAL | Status: DC
  Administered 2018-09-21 – 2018-09-23 (×4): 150 mg via ORAL
  Filled 2018-09-21 (×4): qty 1

## 2018-09-21 MED ORDER — ACETAMINOPHEN 650 MG RE SUPP: 650.0000 mg | RECTAL | Status: DC | PRN

## 2018-09-21 MED ORDER — ACETAMINOPHEN 325 MG PO TABS: 650.0000 mg | ORAL_TABLET | ORAL | Status: DC | PRN

## 2018-09-21 MED ORDER — STROKE: EARLY STAGES OF RECOVERY BOOK
Freq: Once | Status: AC
  Administered 2018-09-21: 21:00:00

## 2018-09-21 MED ORDER — ROSUVASTATIN CALCIUM 10 MG PO TABS
10.0000 mg | ORAL_TABLET | Freq: Every day | ORAL | Status: DC
  Administered 2018-09-21: 10 mg via ORAL
  Filled 2018-09-21 (×2): qty 1

## 2018-09-21 NOTE — ED Notes (Signed)
Patient alert and oriented x 4. Patient given ginger-ale to drink. Patient having no difficulty swallowing at this time.

## 2018-09-21 NOTE — ED Provider Notes (Signed)
[facility] EMERGENCY DEPARTMENT Provider Note   CSN: [id number] Arrival date & time: 09/21/18  1259    History   Chief Complaint Chief Complaint  Patient presents with   Cerebrovascular Accident    HPI Pamela Hughes is a 78 y.o. female.     Patient presenting with the concerns of a stroke.  Says she has had symptoms for a week or maybe a little bit longer.  That includes dizziness but no room spinning.  Talks about a headache but it was not severe but some mild nausea.  And the main thing was that she noticed that even though her vision seemed okay she had trouble reading she can understand the words but they were clear to her eyes.  Blood pressure on arrival was 167/76.  She was afebrile no upper respiratory symptoms.  Patient's past medical history significant for former former smoker she quit in 1976.  History of hypertension.  No prior history of stroke.     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   Left foot pain 01/31/2015   Allergic rhinitis 01/31/2015   Hypertension    Depression    Allergy    Alopecia    Arthritis     Past Surgical History:  Procedure Laterality Date   ABDOMINAL HYSTERECTOMY     one ovary left   CATARACT EXTRACTION W/PHACO Right 02/17/2018   Procedure: CATARACT EXTRACTION PHACO AND INTRAOCULAR LENS PLACEMENT (IOC);  Surgeon: Fabio Pierce, MD;  Location: [facility];   Service: Ophthalmology;  Laterality: Right;  CDE: 4.84   CATARACT EXTRACTION W/PHACO Left 03/31/2018   Procedure: CATARACT EXTRACTION PHACO AND INTRAOCULAR LENS PLACEMENT (IOC) LEFT CDE: 4.51;  Surgeon: Fabio Pierce, MD;  Location: [facility];  Service: Ophthalmology;  Laterality: Left;   COLONOSCOPY N/A 12/17/2016   Procedure: COLONOSCOPY;  Surgeon: Malissa Hippo, MD;  Location: [facility] SUITE;  Service: Endoscopy;  Laterality: N/A;  2:05   SPINE SURGERY  1990s   L spine     OB History   No obstetric history on file.      Home Medications    Prior to Admission medications   Medication Sig Start Date End Date Taking? Authorizing Provider  albuterol (PROVENTIL HFA;VENTOLIN HFA) 108 (90 Base) MCG/ACT inhaler Inhale 2 puffs into the lungs every 6 (six) hours as needed for wheezing or shortness of breath. 11/10/17  Yes Elenora Gamma, MD  amoxicillin-clavulanate (AUGMENTIN) 875-125 MG tablet Take 1 tablet by mouth 2 (two) times daily. 09/19/18  Yes Daphine Deutscher, Mary-Margaret, FNP  buPROPion (WELLBUTRIN SR) 150 MG 12 hr tablet Take 1 tablet (150 mg total) by mouth 2 (two) times daily. (Needs to be seen before next refill) 09/15/18  Yes Delynn Flavin M, DO  chlorthalidone (HYGROTON) 25 MG tablet Take 1 tablet (25 mg  total) by mouth daily. (Needs to be seen before next refill) 09/15/18  Yes Gottschalk, Ashly M, DO  clotrimazole (MYCELEX) 10 MG troche DISSOLVE 1 TABLET IN MOUTH 5 TIMES DAILY 06/14/18  Yes Dettinger, Elige Radon, MD  gabapentin (NEURONTIN) 100 MG capsule Take 1 capsule (100 mg total) by mouth 3 (three) times daily. One three times daily 08/16/18  Yes Nyoka Cowden, MD  ipratropium (ATROVENT) 0.03 % nasal spray Place 1 spray into both nostrils 2 (two) times daily.  09/14/18  Yes [provider]  meclizine (ANTIVERT) 25 MG tablet Take 1 tablet by mouth 3 (three) times daily as needed. 09/14/18  Yes [provider]  meloxicam (MOBIC) 7.5 MG tablet Take 1 tablet by mouth  daily as needed.    Yes [provider]  omeprazole (PRILOSEC) 20 MG capsule Take 1 capsule (20 mg total) by mouth daily. (Needs to be seen before next refill) 09/15/18  Yes Gottschalk, Ashly M, DO  pantoprazole (PROTONIX) 40 MG tablet Take 1 tablet (40 mg total) by mouth daily. Take 30-60 min before first meal of the day 08/16/18  Yes Nyoka Cowden, MD  rosuvastatin (CRESTOR) 10 MG tablet Take 1 tablet (10 mg total) by mouth daily. 03/21/18  Yes Gottschalk, Kathie Rhodes M, DO  scopolamine (TRANSDERM-SCOP, 1.5 MG,) 1 MG/3DAYS Place 1 patch (1.5 mg total) onto the skin every 3 (three) days. 09/19/18  Yes Daphine Deutscher, Mary-Margaret, FNP  traZODone (DESYREL) 150 MG tablet Take 1 tablet (150 mg total) by mouth at bedtime. (Needs to be seen before next refill) 09/15/18  Yes Dettinger, Elige Radon, MD    Family History Family History  Problem Relation Age of Onset   Alzheimer's disease Mother    Alcohol abuse Father    Diabetes Brother    Healthy Daughter     Social History Social History   Tobacco Use   Smoking status: Former Smoker    Packs/day: 0.25    Years: 5.00    Pack years: 1.25    Types: Cigarettes    Last attempt to quit: 10/09/1974    Years since quitting: 43.9   Smokeless tobacco: Never Used  Substance Use Topics   Alcohol use: Yes    Comment: glass of wine occasionally   Drug use: No     Allergies   Patient has no known allergies.   Review of Systems Review of Systems  Constitutional: Negative for chills and fever.  HENT: Negative for congestion, rhinorrhea and sore throat.   Eyes: Negative for visual disturbance.  Respiratory: Negative for cough and shortness of breath.   Cardiovascular: Negative for chest pain and leg swelling.  Gastrointestinal: Negative for abdominal pain, diarrhea, nausea and vomiting.  Genitourinary: Negative for dysuria.  Musculoskeletal: Negative for back pain and neck pain.  Skin: Negative for rash.  Neurological: Negative for  dizziness, speech difficulty, weakness, light-headedness, numbness and headaches.  Hematological: Does not bruise/bleed easily.  Psychiatric/Behavioral: Positive for confusion.     Physical Exam Updated Vital Signs BP (!) 151/68    Pulse 70    Temp 98.8 F (37.1 C) (Oral)    Resp (!) 8    Ht 1.651 m ( )    Wt 63.5 kg    SpO2 99%    BMI 23.30 kg/m   Physical Exam Vitals signs and nursing note reviewed.  Constitutional:      General: She is not in acute distress.    Appearance: She is well-developed.  HENT:  Head: Normocephalic and atraumatic.     Mouth/Throat:     Mouth: Mucous membranes are moist.  Eyes:     Extraocular Movements: Extraocular movements intact.     Conjunctiva/sclera: Conjunctivae normal.     Pupils: Pupils are equal, round, and reactive to light.  Neck:     Musculoskeletal: Normal range of motion and neck supple.  Cardiovascular:     Rate and Rhythm: Normal rate and regular rhythm.     Heart sounds: No murmur.  Pulmonary:     Effort: Pulmonary effort is normal. No respiratory distress.     Breath sounds: Normal breath sounds.  Abdominal:     Palpations: Abdomen is soft.     Tenderness: There is no abdominal tenderness.  Musculoskeletal: Normal range of motion.        General: No swelling.  Skin:    General: Skin is warm and dry.     Capillary Refill: Capillary refill takes less than 2 seconds.  Neurological:     General: No focal deficit present.     Mental Status: She is alert and oriented to person, place, and time.     Cranial Nerves: No cranial nerve deficit.     Sensory: No sensory deficit.      ED Treatments / Results  Labs (all labs ordered are listed, but only abnormal results are displayed) Labs Reviewed  CBC - Abnormal; Notable for the following components:      Result Value   Hemoglobin 11.6 (*)    HCT 35.4 (*)    All other components within normal limits  COMPREHENSIVE METABOLIC PANEL - Abnormal; Notable for the following  components:   Sodium 133 (*)    Potassium 3.0 (*)    Chloride 96 (*)    Glucose, Bld 126 (*)    Creatinine, Ser 1.46 (*)    GFR calc non Af Amer 34 (*)    GFR calc Af Amer 40 (*)    All other components within normal limits  URINALYSIS, ROUTINE W REFLEX MICROSCOPIC - Abnormal; Notable for the following components:   Color, Urine STRAW (*)    Hgb urine dipstick MODERATE (*)    Leukocytes,Ua SMALL (*)    Bacteria, UA RARE (*)    All other components within normal limits  I-STAT CREATININE, ED - Abnormal; Notable for the following components:   Creatinine, Ser 1.50 (*)    All other components within normal limits  ETHANOL  PROTIME-INR  APTT  DIFFERENTIAL  RAPID URINE DRUG SCREEN, HOSP PERFORMED    EKG EKG Interpretation  Date/Time:  Thursday September 21 2018 13:19:12 EDT Ventricular Rate:  76 PR Interval:    QRS Duration: 89 QT Interval:  423 QTC Calculation: 476 R Axis:   70 Text Interpretation:  Sinus rhythm Baseline wander in lead(s) I III aVL aVF V1 Confirmed by Vanetta Mulders [id number]) on 09/21/2018 1:41:21 PM   Radiology Ct Angio Head W Or Wo Contrast  Result Date: 09/21/2018 CLINICAL DATA:  Dizziness for 3 weeks with chronic cough. Headache and nausea. EXAM: CT ANGIOGRAPHY HEAD AND NECK TECHNIQUE: Multidetector CT imaging of the head and neck was performed using the standard protocol during bolus administration of intravenous contrast. Multiplanar CT image reconstructions and MIPs were obtained to evaluate the vascular anatomy. Carotid stenosis measurements (when applicable) are obtained utilizing NASCET criteria, using the distal internal carotid diameter as the denominator. CONTRAST:  60mL OMNIPAQUE IOHEXOL 350 MG/ML SOLN COMPARISON:  None. FINDINGS: CT HEAD FINDINGS Brain: Asymmetric hypodensity  involving the RIGHT posterior temporal lobe and RIGHT occipital lobe, no hemorrhage, consistent with acute to subacute infarction. No visible mass lesion, hydrocephalus, or  extra-axial fluid. Normal for age cerebral volume. No white matter disease. Vascular: Reported separately. Skull: Normal. Negative for fracture or focal lesion. Sinuses: Imaged portions are clear. Orbits: No acute finding. Review of the MIP images confirms the above findings CTA NECK FINDINGS Aortic arch: Standard branching. Imaged portion shows no evidence of aneurysm or dissection. No significant stenosis of the major arch vessel origins. Aortic atherosclerosis. Right carotid system: No evidence of dissection, stenosis (50% or greater) or occlusion. Minor calcific plaque at the bifurcation. Left carotid system: No evidence of dissection, stenosis (50% or greater) or occlusion. Vertebral arteries: BILATERAL patent. Minor ostial calcification on the LEFT, but no proximal stenosis. Skeleton: Spondylosis.  Missing teeth. Other neck: Noncontributory. Upper chest: Unremarkable. Review of the MIP images confirms the above findings CTA HEAD FINDINGS Anterior circulation: No significant stenosis, proximal occlusion, aneurysm, or vascular malformation. Posterior circulation: Basilar artery widely patent. Both vertebrals contribute to its formation equally. There is an abrupt occlusion of the RIGHT posterior cerebral artery in its proximal P2 segment. Venous sinuses: As permitted by contrast timing, patent. Anatomic variants: None of significance. Delayed phase: Not performed. 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 No hemorrhage. 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 Given the lack of extracranial and intracranial atherosclerotic disease, this may be an embolic event. Findings discussed with ordering provider at the time of interpretation. Electronically Signed   By: Elsie Stain M.D.   On: 09/21/2018 15:52   Ct Angio Neck W Or Wo  Contrast  Result Date: 09/21/2018 CLINICAL DATA:  Dizziness for 3 weeks with chronic cough. Headache and nausea. EXAM: CT ANGIOGRAPHY HEAD AND NECK TECHNIQUE: Multidetector CT imaging of the head and neck was performed using the standard protocol during bolus administration of intravenous contrast. Multiplanar CT image reconstructions and MIPs were obtained to evaluate the vascular anatomy. Carotid stenosis measurements (when applicable) are obtained utilizing NASCET criteria, using the distal internal carotid diameter as the denominator. CONTRAST:  70mL OMNIPAQUE IOHEXOL 350 MG/ML SOLN COMPARISON:  None. FINDINGS: CT HEAD FINDINGS Brain: Asymmetric hypodensity involving the RIGHT posterior temporal lobe and RIGHT occipital lobe, no hemorrhage, consistent with acute to subacute infarction. No visible mass lesion, hydrocephalus, or extra-axial fluid. Normal for age cerebral volume. No white matter disease. Vascular: Reported separately. Skull: Normal. Negative for fracture or focal lesion. Sinuses: Imaged portions are clear. Orbits: No acute finding. Review of the MIP images confirms the above findings CTA NECK FINDINGS Aortic arch: Standard branching. Imaged portion shows no evidence of aneurysm or dissection. No significant stenosis of the major arch vessel origins. Aortic atherosclerosis. Right carotid system: No evidence of dissection, stenosis (50% or greater) or occlusion. Minor calcific plaque at the bifurcation. Left carotid system: No evidence of dissection, stenosis (50% or greater) or occlusion. Vertebral arteries: BILATERAL patent. Minor ostial calcification on the LEFT, but no proximal stenosis. Skeleton: Spondylosis.  Missing teeth. Other neck: Noncontributory. Upper chest: Unremarkable. Review of the MIP images confirms the above findings CTA HEAD FINDINGS Anterior circulation: No significant stenosis, proximal occlusion, aneurysm, or vascular malformation. Posterior circulation: Basilar artery  widely patent. Both vertebrals contribute to its formation equally. There is an abrupt occlusion of the RIGHT posterior cerebral artery in  its proximal P2 segment. Venous sinuses: As permitted by contrast timing, patent. Anatomic variants: None of significance. Delayed phase: Not performed. 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 No hemorrhage. 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 Given the lack of extracranial and intracranial atherosclerotic disease, this may be an embolic event. Findings discussed with ordering provider at the time of interpretation. Electronically Signed   By: Elsie StainJohn T Curnes M.D.   On: 09/21/2018 15:52    Procedures Procedures (including critical care time)  CRITICAL CARE Performed by: Vanetta MuldersScott Ainhoa Rallo Total critical care time: 30 minutes Critical care time was exclusive of separately billable procedures and treating other patients. Critical care was necessary to treat or prevent imminent or life-threatening deterioration. Critical care was time spent personally by me on the following activities: development of treatment plan with patient and/or surrogate as well as nursing, discussions with consultants, evaluation of patient's response to treatment, examination of patient, obtaining history from patient or surrogate, ordering and performing treatments and interventions, ordering and review of laboratory studies, ordering and review of radiographic studies, pulse oximetry and re-evaluation of patient's condition.   Medications Ordered in ED Medications  iohexol (OMNIPAQUE) 350 MG/ML injection 60 mL (60 mLs Intravenous Contrast Given 09/21/18 1459)  ondansetron (ZOFRAN) injection 4 mg (4 mg Intravenous Given 09/21/18 1805)     Initial Impression / Assessment and Plan / ED Course  I have  reviewed the triage vital signs and the nursing notes.  Pertinent labs & imaging results that were available during my care of the patient were reviewed by me and considered in my medical decision making (see chart for details).       Patient arrived with symptoms that could be consistent with a stroke.  Patient due to the duration of the symptoms not a candidate for TPA.  However CT head was done along with the Angie of the head and neck based on her presentation.  That did show evidence of an acute subacute stroke in the posterior circulation area which may explain patient's symptoms.  Discussed with Dr. Gerilyn Pilgrimoonquah our neurologist recommended hospitalist admission here and he would consult.  Patient has remained stable here.  Hospitalist here will admit her.   Final Clinical Impressions(s) / ED Diagnoses   Final diagnoses:  Cerebrovascular accident (CVA), unspecified mechanism [facility](HCC)    ED Discharge Orders    None       Vanetta MuldersZackowski, Thanos Cousineau, MD 09/21/18 1819

## 2018-09-21 NOTE — ED Notes (Signed)
Patient ambulated to the bathroom with assistance. Patient complains of dizziness.

## 2018-09-21 NOTE — H&P (Signed)
[facility] History and Physical  Pamela Hughes UXL:[id number] DOB: February 28, 1941 DOA: 09/21/2018  Referring physician: Dr Rogene Houston PCP: Dettinger, Fransisca Kaufmann, MD   Chief Complaint: Visual abnormalities  HPI: Pamela Hughes is a 78 y.o. female with hx of HTN and past smoker presented today to ED for 1 week history of mild dizziness, headache and visual problems.  Has not been able to understand the words she sees on paper although she has not problems seeing the words.  In ED work-up showed quite large R occipital CVA.  Asked to see for CVA admit.   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  She says they told to come back in a year, which would have been Feb 2020 but due to [place] she hasn't been able to f/u yet. In the echart it looks like the carotid study was done July 2019 and showed L carotid showing ^'d peak velocities compatible w/ lower end of the 50-69% luminal narrowing range.  The R carotid had moderate plaque within the bulb but not resulting in ^'d velocities ( I.e. did not suggest any sig stenosis).   Patient described about 2 weeks having an acute "falling out" episode at home.  She fell to the ground while cooking in the kitchen.  Did not hit her head.  Ever since has been having dizziness sensation. The past week on a couple of occassions she could not identify close relatives, and when reading a book did not understand what they were trying to say even though reading the words was not a problem.    Pt notes hx of "colitis" (collagenous per chart, bx proven, responded to po Rx budesonide) and uses prn imodium during the day and before bedtime.   ROS  denies CP  no joint pain   no HA  no blurry vision  no rash  no diarrhea  no nausea/ vomiting  no dysuria  no difficulty voiding  no change in urine color    Past Medical History  Past Medical History:  Diagnosis Date  . Allergy   .  Alopecia   . Anxiety   . Arthritis   . Asthma   . Collagenous colitis   . Depression   . Diarrhea    chronic- "states for at least a year)  . Edema   . GERD (gastroesophageal reflux disease)   . HOH (hard of hearing)   . Hypertension    Past Surgical History  Past Surgical History:  Procedure Laterality Date  . ABDOMINAL HYSTERECTOMY     one ovary left  . CATARACT EXTRACTION W/PHACO Right 02/17/2018   Procedure: CATARACT EXTRACTION PHACO AND INTRAOCULAR LENS PLACEMENT (IOC);  Surgeon: Baruch Goldmann, MD;  Location: [facility];  Service: Ophthalmology;  Laterality: Right;  CDE: 4.84  . CATARACT EXTRACTION W/PHACO Left 03/31/2018   Procedure: CATARACT EXTRACTION PHACO AND INTRAOCULAR LENS PLACEMENT (IOC) LEFT CDE: 4.51;  Surgeon: Baruch Goldmann, MD;  Location: [facility];  Service: Ophthalmology;  Laterality: Left;  . COLONOSCOPY N/A 12/17/2016   Procedure: COLONOSCOPY;  Surgeon: Rogene Houston, MD;  Location: [facility];  Service: Endoscopy;  Laterality: N/A;  2:05  . SPINE SURGERY  1990s   L spine   Family History  Family History  Problem Relation Age of Onset  . Alzheimer's disease Mother   . Alcohol abuse Father   . Diabetes Brother   .  Healthy Daughter    Social History  reports that she quit smoking about 43 years ago. Her smoking use included cigarettes. She has a 1.25 pack-year smoking history. She has never used smokeless tobacco. She reports current alcohol use. She reports that she does not use drugs. Allergies No Known Allergies Home medications Prior to Admission medications   Medication Sig Start Date End Date Taking? Authorizing Provider  albuterol (PROVENTIL HFA;VENTOLIN HFA) 108 (90 Base) MCG/ACT inhaler Inhale 2 puffs into the lungs every 6 (six) hours as needed for wheezing or shortness of breath. 11/10/17  Yes Timmothy Euler, MD  amoxicillin-clavulanate (AUGMENTIN) 875-125 MG tablet Take 1 tablet by mouth 2 (two) times daily. 09/19/18  Yes Hassell Done, Mary-Margaret,  FNP  buPROPion (WELLBUTRIN SR) 150 MG 12 hr tablet Take 1 tablet (150 mg total) by mouth 2 (two) times daily. (Needs to be seen before next refill) 09/15/18  Yes Ronnie Doss M, DO  chlorthalidone (HYGROTON) 25 MG tablet Take 1 tablet (25 mg total) by mouth daily. (Needs to be seen before next refill) 09/15/18  Yes Gottschalk, Ashly M, DO  clotrimazole (MYCELEX) 10 MG troche DISSOLVE 1 TABLET IN MOUTH 5 TIMES DAILY 06/14/18  Yes Dettinger, Fransisca Kaufmann, MD  gabapentin (NEURONTIN) 100 MG capsule Take 1 capsule (100 mg total) by mouth 3 (three) times daily. One three times daily 08/16/18  Yes Tanda Rockers, MD  ipratropium (ATROVENT) 0.03 % nasal spray Place 1 spray into both nostrils 2 (two) times daily.  09/14/18  Yes [provider]  meclizine (ANTIVERT) 25 MG tablet Take 1 tablet by mouth 3 (three) times daily as needed. 09/14/18  Yes [provider]  meloxicam (MOBIC) 7.5 MG tablet Take 1 tablet by mouth daily as needed.    Yes [provider]  omeprazole (PRILOSEC) 20 MG capsule Take 1 capsule (20 mg total) by mouth daily. (Needs to be seen before next refill) 09/15/18  Yes Gottschalk, Ashly M, DO  pantoprazole (PROTONIX) 40 MG tablet Take 1 tablet (40 mg total) by mouth daily. Take 30-60 min before first meal of the day 08/16/18  Yes Tanda Rockers, MD  rosuvastatin (CRESTOR) 10 MG tablet Take 1 tablet (10 mg total) by mouth daily. 03/21/18  Yes Gottschalk, Leatrice Jewels M, DO  scopolamine (TRANSDERM-SCOP, 1.5 MG,) 1 MG/3DAYS Place 1 patch (1.5 mg total) onto the skin every 3 (three) days. 09/19/18  Yes Hassell Done, Mary-Margaret, FNP  traZODone (DESYREL) 150 MG tablet Take 1 tablet (150 mg total) by mouth at bedtime. (Needs to be seen before next refill) 09/15/18  Yes Dettinger, Fransisca Kaufmann, MD   Liver Function Tests Recent Labs  Lab 09/21/18 1342  AST 37  ALT 21  ALKPHOS 66  BILITOT 0.7  PROT 6.7  ALBUMIN 3.8   No results for input(s): LIPASE, AMYLASE in the last 168  hours. CBC Recent Labs  Lab 09/21/18 1342  WBC 6.2  NEUTROABS 4.1  HGB 11.6*  HCT 35.4*  MCV 91.2  PLT 989   Basic Metabolic Panel Recent Labs  Lab 09/21/18 1341 09/21/18 1342  NA  --  133*  K  --  3.0*  CL  --  96*  CO2  --  26  GLUCOSE  --  126*  BUN  --  23  CREATININE 1.50* 1.46*  CALCIUM  --  9.0     Vitals:   09/21/18 1415 09/21/18 1430 09/21/18 1445 09/21/18 1500  BP:  (!) 164/62  (!) 151/68  Pulse: 74 66  72 70  Resp: 10 (!) 6 (!) 9 (!) 8  Temp:      TempSrc:      SpO2: 98% 99% 100% 99%  Weight:      Height:       Exam: Gen alert, no distress No rash, cyanosis or gangrene Sclera anicteric, throat clear  No jvd or bruits Chest clear bilat RRR no MRG Abd soft ntnd no mass or ascites +bs GU defer MS no joint effusions or deformity Ext no LE edema / no wounds or ulcers Neuro is alert, Ox 3 Visual fields intact, horizontal gaze is intact Arms/ legs 5/5 strength and sensation intact CN's grossly intact NIHSS = 0     Home meds:  - chlorthalidone 25 qd  - rosuvastatin 10 qd  - trazodone 150 hx/ gabapentin 100 tid/ bupropion sr 150 bid  - prn meclizine/ prn meloxicam/ prn scopolamine/ prn clotrimazole/ prn albuterol  - amoxicillin-clavulanate bid   Na 133  K 3.0  BN 23  Cr 1.46  CO2 26  CL 96   Ca 9.0  Alb 3.8  LFT's ok  eGFR 34   WBC 6k Hb 11.6 mcv 91  plt 329  INR 1.0  pTT 34  Glu 126   UA neg protein, clear, 11-20 wbc, 0-5 epi/ rbc, rare bact   CTA head w/ and w/o contrast > IMPRESSION: Acute to subacute RIGHT PCA territory infarct affecting the posterior temporal and occipital regions on the RIGHT. No hemorrhage.  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 Given the lack of extracranial and intracranial atherosclerotic disease, this may be an embolic event. EKG (independ reviewed) > NSR, 80's, no ischemic changes UA negative  Assessment: 1. Subacute CVA - R PCA territory, large CVA.  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  Hx of moderate carotid disease last summer by doppler. Hx HTN, HL. Admit, start ecasa 325/d, get echo and carotid dopplers.  Neurology consult in am.   2. HTN - takes chlorthalidone 3. HL - cont meds 4. Depression - cont meds 5. CKD stage III - creat close to baseline (Cr 1.3- 1.5 in 2019). UA negative.   Plan - as above   DVT prophylaxis: lovenox sq Family communication: none here Code status: full Admit status: Inpatient Bed type: telemetry     Rob Doctor, hospital / [facility] [phone] 09/21/2018, 6:55 PM  If 7PM-7AM, please contact night-coverage [email or web address] Password [facility] 09/21/2018, 6:55 PM

## 2018-09-21 NOTE — ED Notes (Signed)
Pt assisted to the bathroom with one assist due to dizziness

## 2018-09-21 NOTE — ED Notes (Signed)
Patient given snacks to eat at this time.

## 2018-09-21 NOTE — ED Triage Notes (Signed)
For the last few weeks, pt has been really dizzy and had a headache and was nauseous. Last night, pt was not able to remember the way to get to her bathroom. This happened at 1700. Is also not able to focus enough to read. States these symptoms have been going on for 2-3 weeks but it has gotten worse.

## 2018-09-22 ENCOUNTER — Inpatient Hospital Stay (HOSPITAL_COMMUNITY): Payer: Medicare Other

## 2018-09-22 DIAGNOSIS — I351 Nonrheumatic aortic (valve) insufficiency: Secondary | ICD-10-CM

## 2018-09-22 LAB — LIPID PANEL
Cholesterol: 166 mg/dL (ref 0–200)
HDL: 97 mg/dL (ref >40)
LDL Cholesterol: 61 mg/dL (ref 0–99)
Total CHOL/HDL Ratio: 1.7 RATIO
Triglycerides: 41 mg/dL (ref <150)
VLDL: 8 mg/dL (ref 0–40)

## 2018-09-22 LAB — ECHOCARDIOGRAM COMPLETE
Height: 65 in
Weight: 2261.04 oz

## 2018-09-22 LAB — HEMOGLOBIN A1C
Hgb A1c MFr Bld: 5.3 % (ref 4.8–5.6)
Mean Plasma Glucose: 105.41 mg/dL

## 2018-09-22 MED ORDER — CLOPIDOGREL BISULFATE 75 MG PO TABS
75.0000 mg | ORAL_TABLET | Freq: Every day | ORAL | Status: DC
  Administered 2018-09-23: 75 mg via ORAL
  Filled 2018-09-22: qty 1

## 2018-09-22 MED ORDER — ENOXAPARIN SODIUM 30 MG/0.3ML ~~LOC~~ SOLN
30.0000 mg | SUBCUTANEOUS | Status: DC
  Administered 2018-09-22: 30 mg via SUBCUTANEOUS
  Filled 2018-09-22: qty 0.3

## 2018-09-22 MED ORDER — ASPIRIN EC 81 MG PO TBEC
81.0000 mg | DELAYED_RELEASE_TABLET | Freq: Every day | ORAL | Status: DC
  Administered 2018-09-23: 81 mg via ORAL
  Filled 2018-09-22: qty 1

## 2018-09-22 MED ORDER — ROSUVASTATIN CALCIUM 20 MG PO TABS
20.0000 mg | ORAL_TABLET | Freq: Every day | ORAL | Status: DC
  Administered 2018-09-22: 20 mg via ORAL

## 2018-09-22 NOTE — Clinical Social Work Note (Addendum)
Patient is agreeable to HHPT. She was provided choices for [facility] and DME and chose [facility] and [facility] for DME.    Olegario Messier at [facility] was provided referral for cane.  Linda at [facility] was provided referral for PT services.    Attending notified of need for DME order. DME cannot be delivered until order is placed.     Theoplis Garciagarcia, Juleen China, LCSW

## 2018-09-22 NOTE — Progress Notes (Signed)
PHARMACY NOTE:    RENAL DOSAGE ADJUSTMENT  Current anticoagulant regimen includes a mismatch between  anticoagulant dosage and estimated renal function.  As per policy approved by the Pharmacy & Therapeutics and Medical Executive Committees, the anticoagulant dosage will be adjusted accordingly.  Current anti-coagulant dosage:  Lovenox 40mg  daily  Indication: DVT prophylaxis  Renal Function:  CrClest~ 28.6 mL/min   Estimated Creatinine Clearance: 28.6 mL/min (A) (by C-G formula based on SCr of 1.46 mg/dL (H)). []      On intermittent HD, scheduled: []      On CRRT    Anti-coagulant dosage has been changed to:   Lovenox 30mg  daily     Thank you for allowing pharmacy to be a part of this patient's care.  Tama High, [facility] 09/22/2018 8:08 AM

## 2018-09-22 NOTE — Plan of Care (Signed)
  Problem: Acute Rehab PT Goals(only PT should resolve) Goal: Pt Will Go Supine/Side To Sit Outcome: Progressing Flowsheets (Taken 09/22/2018 0952) Pt will go Supine/Side to Sit: Independently Goal: Patient Will Transfer Sit To/From Stand Outcome: Progressing Flowsheets (Taken 09/22/2018 [phone]) Patient will transfer sit to/from stand: with modified independence Goal: Pt Will Transfer Bed To Chair/Chair To Bed Outcome: Progressing Flowsheets (Taken 09/22/2018 0952) Pt will Transfer Bed to Chair/Chair to Bed: with modified independence Goal: Pt Will Ambulate Outcome: Progressing Flowsheets (Taken 09/22/2018 0952) Pt will Ambulate: > 125 feet; with modified independence; with cane Note:  Use SPC if needed  9:53 AM, 09/22/18 Ocie Bob, MPT Physical Therapist with [facility] [phone] office [phone] mobile phone

## 2018-09-22 NOTE — Evaluation (Signed)
Speech Language Pathology Evaluation Patient Details Name: Pamela Hughes CommentDoris P Petrow MRN: [id number] DOB: April 24, 1941 Today's Date: 09/22/2018 Time: [id number] SLP Time Calculation (min) (ACUTE ONLY): 26 min  Problem List:  Patient Active Problem List   Diagnosis Date Noted  . 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 CVA (cerebral vascular accident) (HCC) 09/21/2018  . Abnormal CXR 06/03/2018  . Chronic cough 05/18/2018  . DOE (dyspnea on exertion) 05/18/2018  . Collagenous colitis 02/16/2017  . Family hx of colon cancer 11/11/2016  . Osteoarthritis of both hands 06/27/2015  . 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 Left foot pain 01/31/2015  . Allergic rhinitis 01/31/2015  . Hypertension   . Depression   . Allergy   . Alopecia   . Arthritis    Past Medical History:  Past Medical History:  Diagnosis Date  . Allergy   . Alopecia   . Anxiety   . Arthritis   . Asthma   . Collagenous colitis   . Depression   . Diarrhea    chronic- "states for at least a year)  . Edema   . GERD (gastroesophageal reflux disease)   . HOH (hard of hearing)   . Hypertension    Past Surgical History:  Past Surgical History:  Procedure Laterality Date  . ABDOMINAL HYSTERECTOMY     one ovary left  . CATARACT EXTRACTION W/PHACO Right 02/17/2018   Procedure: CATARACT EXTRACTION PHACO AND INTRAOCULAR LENS PLACEMENT (IOC);  Surgeon: Fabio PierceWrzosek, James, MD;  Location: [facility];  Service: Ophthalmology;  Laterality: Right;  CDE: 4.84  . CATARACT EXTRACTION W/PHACO Left 03/31/2018   Procedure: CATARACT EXTRACTION PHACO AND INTRAOCULAR LENS PLACEMENT (IOC) LEFT CDE: 4.51;  Surgeon: Fabio PierceWrzosek, James, MD;  Location: [facility];  Service: Ophthalmology;  Laterality: Left;  . COLONOSCOPY N/A 12/17/2016   Procedure: COLONOSCOPY;  Surgeon: Malissa Hippoehman, Najeeb U, MD;  Location: [facility];  Service: Endoscopy;  Laterality: N/A;  2:05  . SPINE  SURGERY  1990s   L spine   HPI:  Pamela Hughes CommentDoris P Meddings is a 78 y.o. female with hx of HTN and past smoker presented today to ED for 1 week history of mild dizziness, headache and visual problems.  Has not been able to understand the words she sees on paper although she has not problems seeing the words.  In ED work-up showed quite large R occipital CVA.  Asked to see for CVA admit.    Assessment / Plan / Recommendation Clinical Impression  SLE completed as part of stroke protocol. Cognitive linguistic skills WNL for items assessed. See below for details. Pt does complain of visual deficits, however she did not have her glasses present for evaluation (she was able to read large print and write personal/bio information). No further SLP services indicated at this time. Pt will have assist from her daughter post discharge, however she was independent with all activities prior to admission (driving, medication management, cooking, and finances).     SLP Assessment  SLP Recommendation/Assessment: Patient does not need any further Speech Lanaguage Pathology Services SLP Visit Diagnosis: Cognitive communication deficit (R41.841)    Follow Up Recommendations  None    Frequency and Duration           SLP Evaluation Cognition  Overall Cognitive Status: Within Functional Limits for tasks assessed Arousal/Alertness: Awake/alert Orientation Level: Oriented X4 Memory: Appears intact Awareness: Appears intact Problem Solving: Appears intact Safety/Judgment: Appears intact  Comprehension  Auditory Comprehension Overall Auditory Comprehension: Appears within functional limits for tasks assessed Yes/No Questions: Within Functional Limits Commands: Within Functional Limits Conversation: Complex Visual Recognition/Discrimination Discrimination: (Pt without her glasses) Reading Comprehension Reading Status: Within funtional limits(for short sentences)    Expression Expression Primary Mode of  Expression: Verbal Verbal Expression Overall Verbal Expression: Appears within functional limits for tasks assessed Initiation: No impairment Automatic Speech: Name;Month of year;Day of week Level of Generative/Spontaneous Verbalization: Conversation Repetition: No impairment Naming: No impairment Pragmatics: No impairment Non-Verbal Means of Communication: Not applicable Written Expression Dominant Hand: Right Written Expression: (Able to write name and address)   Oral / Motor  Oral Motor/Sensory Function Overall Oral Motor/Sensory Function: Within functional limits Motor Speech Overall Motor Speech: Appears within functional limits for tasks assessed Respiration: Within functional limits Phonation: Normal Resonance: Within functional limits Articulation: Within functional limitis Intelligibility: Intelligible Motor Planning: Witnin functional limits Motor Speech Errors: Not applicable   Thank you,  Havery Moros, CCC-SLP [phone]                     Benn Tarver 09/22/2018, 1:08 PM

## 2018-09-22 NOTE — Evaluation (Signed)
Physical Therapy Evaluation Patient Details Name: Pamela Hughes MRN: [id number] DOB: [birth year] Today's Date: 09/22/2018   History of Present Illness  Pamela Hughes is a 78 y.o. female with hx of HTN and past smoker presented today to ED for 1 week history of mild dizziness, headache and visual problems.  Has not been able to understand the words she sees on paper although she has not problems seeing the words.  In ED work-up showed quite large R occipital CVA.  Asked to see for CVA admit.     Clinical Impression  Patient functioning near baseline for functional mobility and gait, able to ambulate in hallway without loss of balance, but slightly unsteady with slower than normal cadence mostly due to c/o mild to moderate dizziness.  Patient declined to sit up in chair after therapy secondary to feeling cold.  Patient will benefit from continued physical therapy in hospital and recommended venue below to increase strength, balance, endurance for safe ADLs and gait.     Follow Up Recommendations Home health PT;Supervision - Intermittent    Equipment Recommendations  Cane    Recommendations for Other Services       Precautions / Restrictions Precautions Precautions: Fall Restrictions Weight Bearing Restrictions: No      Mobility  Bed Mobility Overal bed mobility: Modified Independent                Transfers Overall transfer level: Needs assistance Equipment used: None;Straight cane Transfers: Sit to/from Stand;Stand Pivot Transfers Sit to Stand: Supervision Stand pivot transfers: Supervision       General transfer comment: slightly labored movement  Ambulation/Gait Ambulation/Gait assistance: Supervision Gait Distance (Feet): 100 Feet Assistive device: None;Straight cane Gait Pattern/deviations: Decreased step length - right;Decreased step length - left;Decreased stride length;Step-through pattern Gait velocity: slightly decreased   General Gait Details: slightly  unsteady cadence without loss of balance on level, inclined and declined surfaces, demonstrates mostly step-through pattern using SPC, but requires verbal cueing for proper use with fair carryover  Stairs            Wheelchair Mobility    Modified Rankin (Stroke Patients Only)       Balance Overall balance assessment: Needs assistance Sitting-balance support: Feet supported;No upper extremity supported Sitting balance-Leahy Scale: Good     Standing balance support: During functional activity;No upper extremity supported Standing balance-Leahy Scale: Fair Standing balance comment: fair/good without AD and using SPC                             Pertinent Vitals/Pain Pain Assessment: No/denies pain    Home Living Family/patient expects to be discharged to:: Private residence Living Arrangements: Alone Available Help at Discharge: Family Type of Home: House Home Access: Stairs to enter Entrance Stairs-Rails: Right;Left;Can reach both Secretary/administrator of Steps: 4 Home Layout: One level Home Equipment: None      Prior Function Level of Independence: Independent               Hand Dominance   Dominant Hand: Right    Extremity/Trunk Assessment   Upper Extremity Assessment Upper Extremity Assessment: Defer to OT evaluation    Lower Extremity Assessment Lower Extremity Assessment: Generalized weakness    Cervical / Trunk Assessment Cervical / Trunk Assessment: Normal  Communication   Communication: No difficulties  Cognition Arousal/Alertness: Awake/alert Behavior During Therapy: WFL for tasks assessed/performed Overall Cognitive Status: Within Functional Limits for tasks assessed  General Comments      Exercises     Assessment/Plan    PT Assessment Patient needs continued PT services  PT Problem List Decreased activity tolerance;Decreased balance;Decreased  mobility;Decreased strength       PT Treatment Interventions Therapeutic exercise;Gait training;Stair training;Therapeutic activities;Patient/family education;Functional mobility training    PT Goals (Current goals can be found in the Care Plan section)  Acute Rehab PT Goals Patient Stated Goal: return home PT Goal Formulation: With patient Time For Goal Achievement: 09/24/18 Potential to Achieve Goals: Good    Frequency 7X/week   Barriers to discharge        Co-evaluation               AM-PAC PT "6 Clicks" Mobility  Outcome Measure Help needed turning from your back to your side while in a flat bed without using bedrails?: None Help needed moving from lying on your back to sitting on the side of a flat bed without using bedrails?: None Help needed moving to and from a bed to a chair (including a wheelchair)?: None Help needed standing up from a chair using your arms (e.g., wheelchair or bedside chair)?: None Help needed to walk in hospital room?: A Little Help needed climbing 3-5 steps with a railing? : A Little 6 Click Score: 22    End of Session Equipment Utilized During Treatment: Gait belt Activity Tolerance: Patient tolerated treatment well;Other (comment)(Patient limited by c/o dizziness) Patient left: in bed;with call bell/phone within reach Nurse Communication: Mobility status PT Visit Diagnosis: Unsteadiness on feet (R26.81);Other abnormalities of gait and mobility (R26.89);Muscle weakness (generalized) (M62.81)    Time: [id number] PT Time Calculation (min) (ACUTE ONLY): 32 min   Charges:   PT Evaluation $PT Eval Moderate Complexity: 1 Mod PT Treatments $Therapeutic Activity: 23-37 mins        9:51 AM, 09/22/18 Ocie BobJames Rebecah Dangerfield, MPT Physical Therapist [facility] [phone] office [phone] mobile phone

## 2018-09-22 NOTE — Evaluation (Signed)
Occupational Therapy Evaluation Patient Details Name: Pamela Hughes CommentDoris P Bartoletti MRN: [id number] DOB: 1940-07-25 Today's Date: 09/22/2018    History of Present Illness Pamela Hughes CommentDoris P Hughes is a 78 y.o. female with hx of HTN and past smoker presented today to ED for 1 week history of mild dizziness, headache and visual problems.  Has not been able to understand the words she sees on paper although she has not problems seeing the words.  In ED work-up showed quite large R occipital CVA.  Asked to see for CVA admit.    Clinical Impression   Pt in bed upon therapy arrival and agreeable to participate in OT evaluation. Pt reports that she wears reading glasses which she does not have with her. Difficulty assessing visual completely due to this. Pt reports that words/letters are blurry and when she is dizzy it is difficult to read which is understandable. Patient did not show signs of visual agnosia as she was able to comprehend all things read. Patient was able to pick up objects identified by therapist on tray table. Pt is physically able to complete all basic ADL tasks. I did recommend that initially she has someone with her when she goes home if the dizziness does not resolve. Her daughter is able to help although works a few days a week. Patient does not require any additional OT services at this time.     Follow Up Recommendations  No OT follow up;Supervision - Intermittent    Equipment Recommendations  Tub/shower seat       Precautions / Restrictions Precautions Precautions: Fall Restrictions Weight Bearing Restrictions: No             ADL either performed or assessed with clinical judgement   ADL Overall ADL's : At baseline       General ADL Comments: Due to dizziness, patient will require supervision to prevent falls. Overall, patient is able to complete all aspects of basic daily tasks.      Vision Baseline Vision/History: Wears glasses(has history of cataracts with surgery on both eyes to  address) Wears Glasses: Reading only(Pt does not have glasses with her.) Patient Visual Report: Blurring of vision Vision Assessment?: Yes;No apparent visual deficits Ocular Range of Motion: Within Functional Limits Alignment/Gaze Preference: Within Defined Limits Visual Fields: No apparent deficits Additional Comments: Patient did not show any signs of visual agnosia during OT evaluation. Difficulty with vision assessment during evaluation due to lack of reading glasses.             Pertinent Vitals/Pain Pain Assessment: No/denies pain     Hand Dominance Right   Extremity/Trunk Assessment Upper Extremity Assessment Upper Extremity Assessment: Overall WFL for tasks assessed   Lower Extremity Assessment Lower Extremity Assessment: Defer to PT evaluation   Cervical / Trunk Assessment Cervical / Trunk Assessment: Normal   Communication Communication Communication: No difficulties   Cognition Arousal/Alertness: Awake/alert Behavior During Therapy: WFL for tasks assessed/performed Overall Cognitive Status: Within Functional Limits for tasks assessed                Home Living Family/patient expects to be discharged to:: Private residence Living Arrangements: Alone Available Help at Discharge: Family Type of Home: House Home Access: Stairs to enter Secretary/administratorntrance Stairs-Number of Steps: 4 Entrance Stairs-Rails: Right;Left;Can reach both Home Layout: One level         FirefighterBathroom Toilet: Standard Bathroom Accessibility: Yes   Home Equipment: None          Prior Functioning/Environment Level of Independence: Independent  OT Goals(Current goals can be found in the care plan section) Acute Rehab OT Goals Patient Stated Goal: return home  OT Frequency:      AM-PAC OT "6 Clicks" Daily Activity     Outcome Measure Help from another person eating meals?: None Help from another person taking care of personal grooming?: None Help from  another person toileting, which includes using toliet, bedpan, or urinal?: None Help from another person bathing (including washing, rinsing, drying)?: None Help from another person to put on and taking off regular upper body clothing?: None Help from another person to put on and taking off regular lower body clothing?: None 6 Click Score: 24   End of Session    Activity Tolerance: Patient tolerated treatment well Patient left: in bed;with call bell/phone within reach  OT Visit Diagnosis: Muscle weakness (generalized) (M62.81)                Time: 6962-9528 OT Time Calculation (min): 14 min Charges:  OT General Charges $OT Visit: 1 Visit OT Evaluation $OT Eval Low Complexity: 1 Low  Limmie Patricia, OTR/L,CBIS  [phone]  Shaivi Rothschild, Charisse March 09/22/2018, 10:30 AM

## 2018-09-22 NOTE — Progress Notes (Signed)
PROGRESS NOTE    Pamela Hughes  AGT:[id number] DOB: [birth year] DOA: 09/21/2018 PCP: Dettinger, Elige Radon, MD   Brief Narrative:  Per HPI: Pamela Hughes is a 78 y.o. female with hx of HTN and past smoker presented today to ED for 1 week history of mild dizziness, headache and visual problems.  Has not been able to understand the words she sees on paper although she has not problems seeing the words.  In ED work-up showed quite large R occipital CVA.  Asked to see for CVA admit.   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  She says they told to come back in a year, which would have been Feb 2020 but due to COVID she hasn't been able to f/u yet. In the echart it looks like the carotid study was done July 2019 and showed L carotid showing ^'d peak velocities compatible w/ lower end of the 50-69% luminal narrowing range.  The R carotid had moderate plaque within the bulb but not resulting in ^'d velocities ( I.e. did not suggest any sig stenosis).   Patient described about 2 weeks having an acute "falling out" episode at home.  She fell to the ground while cooking in the kitchen.  Did not hit her head.  Ever since has been having dizziness sensation. The past week on a couple of occassions she could not identify close relatives, and when reading a book did not understand what they were trying to say even though reading the words was not a problem.    Pt notes hx of "colitis" (collagenous per chart, bx proven, responded to po Rx budesonide) and uses prn imodium during the day and before bedtime.    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 Subacute CVA - R PCA territory, large CVA.  Visual agnosia (?) experienced by patient the  last several days at home.  Hx of moderate carotid disease last summer by doppler. Hx HTN, HL. Admit, start ecasa 325/d, get echo and carotid dopplers.  Neurology consult appreciated.  Hoping to DC today with home health per PT recommendations, but 2D echocardiogram still pending. 2. HTN - takes chlorthalidone 3. HL - cont meds 4. Depression - cont meds 5. CKD stage III - creat close to baseline (Cr 1.3- 1.5 in 2019). UA negative.    DVT prophylaxis: Lovenox Code Status: Full Family Communication: None at bedside Disposition Plan: Continue to monitor with neurology consultation, 2D echocardiogram pending.   Consultants:   Neurology  Procedures:   None  Antimicrobials:   None   Subjective: Patient seen and evaluated today with no new acute complaints or concerns. No acute concerns or events noted overnight.  Objective: Vitals:   09/22/18 0440 09/22/18 0640 09/22/18 0840 09/22/18 1240  BP: (!) 135/53 (!) 161/61 (!) 153/62 (!) 149/63  Pulse: 64 71 69 70  Resp: 16 17  17   Temp: (!) 97.5 F (36.4 C)  97.6 F (36.4 C) 98.6 F (37 C)  TempSrc:   Oral Oral  SpO2: 97% 96% 98% 96%  Weight:      Height:        Intake/Output Summary (Last 24 hours) at 09/22/2018 1428 Last data filed  at 09/22/2018 0800 Gross per 24 hour  Intake 826.06 ml  Output --  Net 826.06 ml   Filed Weights   09/21/18 1308 09/21/18 2000 09/22/18 0040  Weight: 63.5 kg 64.1 kg 64.1 kg    Examination:  General exam: Appears calm and comfortable  Respiratory system: Clear to auscultation. Respiratory effort normal. Cardiovascular system: S1 & S2 heard, RRR. No JVD, murmurs, rubs, gallops or clicks. No pedal edema. Gastrointestinal system: Abdomen is nondistended, soft and nontender. No organomegaly or masses felt. Normal bowel sounds heard. Central nervous system: Alert and oriented. No focal neurological deficits. Extremities: Symmetric 5 x 5 power. Skin: No rashes, lesions or ulcers Psychiatry:  Judgement and insight appear normal. Mood & affect appropriate.     Data Reviewed: I have personally reviewed following labs and imaging studies  CBC: Recent Labs  Lab 09/21/18 1342  WBC 6.2  NEUTROABS 4.1  HGB 11.6*  HCT 35.4*  MCV 91.2  PLT 329   Basic Metabolic Panel: Recent Labs  Lab 09/21/18 1341 09/21/18 1342  NA  --  133*  K  --  3.0*  CL  --  96*  CO2  --  26  GLUCOSE  --  126*  BUN  --  23  CREATININE 1.50* 1.46*  CALCIUM  --  9.0   GFR: Estimated Creatinine Clearance: 28.6 mL/min (A) (by C-G formula based on SCr of 1.46 mg/dL (H)). Liver Function Tests: Recent Labs  Lab 09/21/18 1342  AST 37  ALT 21  ALKPHOS 66  BILITOT 0.7  PROT 6.7  ALBUMIN 3.8   No results for input(s): LIPASE, AMYLASE in the last 168 hours. No results for input(s): AMMONIA in the last 168 hours. Coagulation Profile: Recent Labs  Lab 09/21/18 1342  INR 1.0   Cardiac Enzymes: No results for input(s): CKTOTAL, CKMB, CKMBINDEX, TROPONINI in the last 168 hours. BNP (last 3 results) No results for input(s): PROBNP in the last 8760 hours. HbA1C: Recent Labs    09/22/18 0432  HGBA1C 5.3   CBG: No results for input(s): GLUCAP in the last 168 hours. Lipid Profile: Recent Labs    09/22/18 0432  CHOL 166  HDL 97  LDLCALC 61  TRIG 41  CHOLHDL 1.7   Thyroid Function Tests: No results for input(s): TSH, T4TOTAL, FREET4, T3FREE, THYROIDAB in the last 72 hours. Anemia Panel: No results for input(s): VITAMINB12, FOLATE, FERRITIN, TIBC, IRON, RETICCTPCT in the last 72 hours. Sepsis Labs: No results for input(s): PROCALCITON, LATICACIDVEN in the last 168 hours.  No results found for this or any previous visit (from the past 240 hour(s)).       Radiology Studies: Ct Angio Head W Or Wo Contrast  Result Date: 09/21/2018 CLINICAL DATA:  Dizziness for 3 weeks with chronic cough. Headache and nausea. EXAM: CT ANGIOGRAPHY HEAD AND NECK TECHNIQUE: Multidetector CT imaging of  the head and neck was performed using the standard protocol during bolus administration of intravenous contrast. Multiplanar CT image reconstructions and MIPs were obtained to evaluate the vascular anatomy. Carotid stenosis measurements (when applicable) are obtained utilizing NASCET criteria, using the distal internal carotid diameter as the denominator. CONTRAST:  60mL OMNIPAQUE IOHEXOL 350 MG/ML SOLN COMPARISON:  None. FINDINGS: CT HEAD FINDINGS Brain: Asymmetric hypodensity involving the RIGHT posterior temporal lobe and RIGHT occipital lobe, no hemorrhage, consistent with acute to subacute infarction. No visible mass lesion, hydrocephalus, or extra-axial fluid. Normal for age cerebral volume. No white matter disease. Vascular: Reported separately. Skull: Normal. Negative for  fracture or focal lesion. Sinuses: Imaged portions are clear. Orbits: No acute finding. Review of the MIP images confirms the above findings CTA NECK FINDINGS Aortic arch: Standard branching. Imaged portion shows no evidence of aneurysm or dissection. No significant stenosis of the major arch vessel origins. Aortic atherosclerosis. Right carotid system: No evidence of dissection, stenosis (50% or greater) or occlusion. Minor calcific plaque at the bifurcation. Left carotid system: No evidence of dissection, stenosis (50% or greater) or occlusion. Vertebral arteries: BILATERAL patent. Minor ostial calcification on the LEFT, but no proximal stenosis. Skeleton: Spondylosis.  Missing teeth. Other neck: Noncontributory. Upper chest: Unremarkable. Review of the MIP images confirms the above findings CTA HEAD FINDINGS Anterior circulation: No significant stenosis, proximal occlusion, aneurysm, or vascular malformation. Posterior circulation: Basilar artery widely patent. Both vertebrals contribute to its formation equally. There is an abrupt occlusion of the RIGHT posterior cerebral artery in its proximal P2 segment. Venous sinuses: As permitted  by contrast timing, patent. Anatomic variants: None of significance. Delayed phase: Not performed. 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 No hemorrhage. 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 Given the lack of extracranial and intracranial atherosclerotic disease, this may be an embolic event. Findings discussed with ordering provider at the time of interpretation. Electronically Signed   By: Elsie Stain M.D.   On: 09/21/2018 15:52   Dg Chest 2 View  Result Date: 09/21/2018 CLINICAL DATA:  CVA EXAM: CHEST - 2 VIEW COMPARISON:  06/02/2018 FINDINGS: No focal opacity or pleural effusion. Cardiomediastinal silhouette within normal limits. Aortic atherosclerosis. No pneumothorax. IMPRESSION: No active cardiopulmonary disease. Electronically Signed   By: Jasmine Pang M.D.   On: 09/21/2018 20:25   Ct Angio Neck W Or Wo Contrast  Result Date: 09/21/2018 CLINICAL DATA:  Dizziness for 3 weeks with chronic cough. Headache and nausea. EXAM: CT ANGIOGRAPHY HEAD AND NECK TECHNIQUE: Multidetector CT imaging of the head and neck was performed using the standard protocol during bolus administration of intravenous contrast. Multiplanar CT image reconstructions and MIPs were obtained to evaluate the vascular anatomy. Carotid stenosis measurements (when applicable) are obtained utilizing NASCET criteria, using the distal internal carotid diameter as the denominator. CONTRAST:  60mL OMNIPAQUE IOHEXOL 350 MG/ML SOLN COMPARISON:  None. FINDINGS: CT HEAD FINDINGS Brain: Asymmetric hypodensity involving the RIGHT posterior temporal lobe and RIGHT occipital lobe, no hemorrhage, consistent with acute to subacute infarction. No visible mass lesion, hydrocephalus, or extra-axial fluid. Normal for age cerebral volume. No white matter disease.  Vascular: Reported separately. Skull: Normal. Negative for fracture or focal lesion. Sinuses: Imaged portions are clear. Orbits: No acute finding. Review of the MIP images confirms the above findings CTA NECK FINDINGS Aortic arch: Standard branching. Imaged portion shows no evidence of aneurysm or dissection. No significant stenosis of the major arch vessel origins. Aortic atherosclerosis. Right carotid system: No evidence of dissection, stenosis (50% or greater) or occlusion. Minor calcific plaque at the bifurcation. Left carotid system: No evidence of dissection, stenosis (50% or greater) or occlusion. Vertebral arteries: BILATERAL patent. Minor ostial calcification on the LEFT, but no proximal stenosis. Skeleton: Spondylosis.  Missing teeth. Other neck: Noncontributory. Upper chest: Unremarkable. Review of the MIP images confirms the above findings CTA HEAD FINDINGS Anterior circulation: No significant stenosis, proximal occlusion, aneurysm, or vascular malformation. Posterior circulation: Basilar artery widely patent. Both vertebrals contribute to its  formation equally. There is an abrupt occlusion of the RIGHT posterior cerebral artery in its proximal P2 segment. Venous sinuses: As permitted by contrast timing, patent. Anatomic variants: None of significance. Delayed phase: Not performed. 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 No hemorrhage. 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 Given the lack of extracranial and intracranial atherosclerotic disease, this may be an embolic event. Findings discussed with ordering provider at the time of interpretation. Electronically Signed   By: Elsie Stain M.D.   On: 09/21/2018 15:52   US Carotid Bilateral (at [facility] And [facility] Only)  Result Date: 09/22/2018 CLINICAL DATA:  CVA and dizziness.  EXAM: BILATERAL CAROTID DUPLEX ULTRASOUND TECHNIQUE: Wallace Cullens scale imaging, color Doppler and duplex ultrasound were performed of bilateral carotid and vertebral arteries in the neck. COMPARISON:  12/12/2017 FINDINGS: Criteria: Quantification of carotid stenosis is based on velocity parameters that correlate the residual internal carotid diameter with NASCET-based stenosis levels, using the diameter of the distal internal carotid lumen as the denominator for stenosis measurement. The following velocity measurements were obtained: RIGHT ICA: 30 cm/sec CCA: 80 cm/sec SYSTOLIC ICA/CCA RATIO:  1.5 ECA: 121 cm/sec LEFT ICA: 141 cm/sec CCA: 101 cm/sec SYSTOLIC ICA/CCA RATIO:  1.1 ECA: 112 cm/sec RIGHT CAROTID ARTERY: Minimal smooth mixed plaque in the bulb. Low resistance internal carotid Doppler pattern is preserved. RIGHT VERTEBRAL ARTERY:  Antegrade. LEFT CAROTID ARTERY: Little if any plaque in the bulb. Low resistance internal carotid Doppler pattern is preserved. Mild calcified plaque in the lower internal carotid artery. LEFT VERTEBRAL ARTERY:  Antegrade. IMPRESSION: Less than 50% stenosis in the right and left internal carotid arteries. Electronically Signed   By: Jolaine Click M.D.   On: 09/22/2018 11:33        Scheduled Meds:  aspirin  300 mg Rectal Daily   Or   aspirin  325 mg Oral Daily   buPROPion  150 mg Oral BID   chlorthalidone  25 mg Oral Daily   enoxaparin (LOVENOX) injection  30 mg Subcutaneous Q24H   gabapentin  100 mg Oral TID   pantoprazole  40 mg Oral Daily   rosuvastatin  10 mg Oral q1800   traZODone  150 mg Oral QHS   Continuous Infusions:  sodium chloride 50 mL/hr at 09/22/18 1259     LOS: 1 day    Time spent: 30 minutes    Afsheen Antony Hoover Brunette, DO [facility] Pager [phone]  If 7PM-7AM, please contact night-coverage [email or web address] Password [email or web address] 09/22/2018, 2:28 PM

## 2018-09-22 NOTE — Care Management Important Message (Signed)
Important Message  Patient Details  Name: Pamela Hughes MRN: [id number] Date of Birth: 01-01-1941   Medicare Important Message Given:  Yes    Corey Harold 09/22/2018, 2:52 PM

## 2018-09-22 NOTE — Progress Notes (Signed)
*  PRELIMINARY RESULTS* Echocardiogram 2D Echocardiogram has been performed.  Stacey Drain 09/22/2018, 3:31 PM

## 2018-09-23 LAB — BASIC METABOLIC PANEL
Anion gap: 9 (ref 5–15)
BUN: 15 mg/dL (ref 8–23)
CO2: 28 mmol/L (ref 22–32)
Calcium: 8.5 mg/dL — ABNORMAL LOW (ref 8.9–10.3)
Chloride: 95 mmol/L — ABNORMAL LOW (ref 98–111)
Creatinine, Ser: 1.33 mg/dL — ABNORMAL HIGH (ref 0.44–1.00)
GFR calc Af Amer: 44 mL/min — ABNORMAL LOW (ref >60)
GFR calc non Af Amer: 38 mL/min — ABNORMAL LOW (ref >60)
Glucose, Bld: 90 mg/dL (ref 70–99)
Potassium: 3 mmol/L — ABNORMAL LOW (ref 3.5–5.1)
Sodium: 132 mmol/L — ABNORMAL LOW (ref 135–145)

## 2018-09-23 MED ORDER — CLOPIDOGREL BISULFATE 75 MG PO TABS: 75.0000 mg | ORAL_TABLET | Freq: Every day | ORAL | 3 refills | Status: AC

## 2018-09-23 MED ORDER — ROSUVASTATIN CALCIUM 20 MG PO TABS: 20.0000 mg | ORAL_TABLET | Freq: Every day | ORAL | 3 refills | Status: DC

## 2018-09-23 MED ORDER — POTASSIUM CHLORIDE CRYS ER 20 MEQ PO TBCR
40.0000 meq | EXTENDED_RELEASE_TABLET | Freq: Once | ORAL | Status: AC
  Administered 2018-09-23: 40 meq via ORAL
  Filled 2018-09-23: qty 2

## 2018-09-23 MED ORDER — ASPIRIN 81 MG PO TBEC: 81.0000 mg | DELAYED_RELEASE_TABLET | Freq: Every day | ORAL | 3 refills | Status: AC

## 2018-09-23 NOTE — Progress Notes (Signed)
IV removed and discharge instructions reviewed  Scripts sent to pharmacy. Teaching completed on importance of keeping appointments, taking medications and BEFAST acronym..  Voiced understanding.  Daughter to transport home.

## 2018-09-23 NOTE — Discharge Summary (Signed)
Physician Discharge Summary  Pamela Hughes:[id number] DOB: 1940/11/20 DOA: 09/21/2018  PCP: Dettinger, Elige Radon, MD  Admit date: 09/21/2018  Discharge date: 09/23/2018  Admitted From:Home  Disposition:  Home  Recommendations for Outpatient Follow-up:  1. Follow up with PCP in 1-2 weeks 2. Follow-up with Dr. Gerilyn Pilgrim with neurology in 6 weeks 3. Continue on aspirin and Plavix as recommended for 3 months 4. Continue on increased dose of statin as noted  Home Health: Yes with PT  Equipment/Devices:None  Discharge Condition:Stable  CODE STATUS: Full  Diet recommendation: Heart Healthy  Brief/Interim Summary: Per HPI: Pamela P Millsis a 78 y.o.femalewith hx of HTN and past smoker presented today to ED for 1 week history of mild dizziness, headache and visual problems. Has not been able to understand the words she sees on paper although she has not problems seeing the words. In ED work-up showed quite large R occipital CVA. Asked to see for CVA admit.   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 She says they told to come back in a year, which would have been Feb 2020 but due to COVID she hasn't been able to f/u yet. In the echart it looks like the carotid study was done July 2019 and showed L carotid showing ^'d peak velocities compatible w/ lower end of the 50-69% luminal narrowing range. The R carotid had moderate plaque within the bulb but not resulting in ^'d velocities ( I.e. did not suggest any sig stenosis).   Patient described about 2 weeks having an acute "falling out" episode at home. She fell to the ground while cooking in the kitchen. Did not hit her head. Ever since has been having dizziness sensation. The past week on a couple of occassions she could not identify close relatives, and when reading a book did not understand what they were trying to say even though reading the words  was not a problem.   Patient was admitted with subacute CVA of the right PCA territory with associated visual lag nausea.  She has been assessed by PT with recommendations for home health at this time.  She has undergone 2D echocardiogram with no significant findings on bubble study at this time and LVEF greater than 65% bilateral carotid ultrasounds with less than 50% stenosis noted bilaterally.  She has been recommended to stay on aspirin 81 mg as well as Plavix 75 mg daily for the next 3 months per neurology will follow up with her in the next 6 weeks.  Statin dose was also increased to 20 mg from 10 mg based on lipid panel results.  She is otherwise stable for discharge.  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 (HCC) Active Problems:   Hypertension   Visual agnosia   CVA (cerebral vascular accident) [facility])    Discharge Instructions  Discharge Instructions    Diet - low sodium heart healthy   Complete by:  As directed    Increase activity slowly   Complete by:  As directed      Allergies as of 09/23/2018   No Known Allergies     Medication List    STOP taking these medications   amoxicillin-clavulanate 875-125 MG tablet Commonly known as:  AUGMENTIN     TAKE these medications   albuterol 108 (90 Base) MCG/ACT inhaler Commonly known as:  VENTOLIN HFA Inhale 2 puffs into the lungs every 6 (six) hours as needed for wheezing or shortness of breath.   aspirin 81 MG EC tablet Take 1 tablet (81 mg total) by mouth daily for 30 days. Start taking on:  September 24, 2018   buPROPion 150 MG 12 hr tablet Commonly known as:  WELLBUTRIN SR Take 1 tablet (150 mg total) by mouth 2 (two) times daily. (Needs to be seen before next refill)   chlorthalidone 25 MG tablet Commonly known as:  HYGROTON Take 1 tablet (25 mg total) by mouth daily. (Needs to be seen before next refill)   clopidogrel 75 MG  tablet Commonly known as:  PLAVIX Take 1 tablet (75 mg total) by mouth daily for 30 days. Start taking on:  September 24, 2018   clotrimazole 10 MG troche Commonly known as:  MYCELEX DISSOLVE 1 TABLET IN MOUTH 5 TIMES DAILY   gabapentin 100 MG capsule Commonly known as:  Neurontin Take 1 capsule (100 mg total) by mouth 3 (three) times daily. One three times daily   ipratropium 0.03 % nasal spray Commonly known as:  ATROVENT Place 1 spray into both nostrils 2 (two) times daily.   meclizine 25 MG tablet Commonly known as:  ANTIVERT Take 1 tablet by mouth 3 (three) times daily as needed.   meloxicam 7.5 MG tablet Commonly known as:  MOBIC Take 1 tablet by mouth daily as needed.   omeprazole 20 MG capsule Commonly known as:  PRILOSEC Take 1 capsule (20 mg total) by mouth daily. (Needs to be seen before next refill)   pantoprazole 40 MG tablet Commonly known as:  Protonix Take 1 tablet (40 mg total) by mouth daily. Take 30-60 min before first meal of the day   rosuvastatin 20 MG tablet Commonly known as:  CRESTOR Take 1 tablet (20 mg total) by mouth daily at 6 PM for 30 days. What changed:    medication strength  how much to take  when to take this   scopolamine 1 MG/3DAYS Commonly known as:  Transderm-Scop (1.5 MG) Place 1 patch (1.5 mg total) onto the skin every 3 (three) days.   traZODone 150 MG tablet Commonly known as:  DESYREL Take 1 tablet (150 mg total) by mouth at bedtime. (Needs to be seen before next refill)            Durable Medical Equipment  (From admission, onward)         Start     Ordered   09/22/18 1425  For home use only DME Cane  Once    Comments:  Single point cane.   09/22/18 1424         Follow-up Information    Dettinger, Elige Radon, MD Follow up in 1 week(s).   Specialties:  Family Medicine, Cardiology Contact information: [address] [place] [address] [phone]        Beryle Beams, MD Follow up in 6 week(s).    Specialty:  Neurology Contact information: [address] [place] [address] [phone]          No Known Allergies  Consultations:  Neurology   Procedures/Studies: Ct Angio Head W Or Wo Contrast  Result Date: 09/21/2018 CLINICAL DATA:  Dizziness for 3 weeks with chronic cough. Headache and nausea. EXAM: CT ANGIOGRAPHY HEAD AND NECK TECHNIQUE: Multidetector CT imaging of the head and neck was performed using the standard protocol during bolus administration of intravenous contrast. Multiplanar CT image reconstructions and  MIPs were obtained to evaluate the vascular anatomy. Carotid stenosis measurements (when applicable) are obtained utilizing NASCET criteria, using the distal internal carotid diameter as the denominator. CONTRAST:  60mL OMNIPAQUE IOHEXOL 350 MG/ML SOLN COMPARISON:  None. FINDINGS: CT HEAD FINDINGS Brain: Asymmetric hypodensity involving the RIGHT posterior temporal lobe and RIGHT occipital lobe, no hemorrhage, consistent with acute to subacute infarction. No visible mass lesion, hydrocephalus, or extra-axial fluid. Normal for age cerebral volume. No white matter disease. Vascular: Reported separately. Skull: Normal. Negative for fracture or focal lesion. Sinuses: Imaged portions are clear. Orbits: No acute finding. Review of the MIP images confirms the above findings CTA NECK FINDINGS Aortic arch: Standard branching. Imaged portion shows no evidence of aneurysm or dissection. No significant stenosis of the major arch vessel origins. Aortic atherosclerosis. Right carotid system: No evidence of dissection, stenosis (50% or greater) or occlusion. Minor calcific plaque at the bifurcation. Left carotid system: No evidence of dissection, stenosis (50% or greater) or occlusion. Vertebral arteries: BILATERAL patent. Minor ostial calcification on the LEFT, but no proximal stenosis. Skeleton: Spondylosis.  Missing teeth. Other neck: Noncontributory. Upper chest: Unremarkable.  Review of the MIP images confirms the above findings CTA HEAD FINDINGS Anterior circulation: No significant stenosis, proximal occlusion, aneurysm, or vascular malformation. Posterior circulation: Basilar artery widely patent. Both vertebrals contribute to its formation equally. There is an abrupt occlusion of the RIGHT posterior cerebral artery in its proximal P2 segment. Venous sinuses: As permitted by contrast timing, patent. Anatomic variants: None of significance. Delayed phase: Not performed. 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 No hemorrhage. 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 Given the lack of extracranial and intracranial atherosclerotic disease, this may be an embolic event. Findings discussed with ordering provider at the time of interpretation. Electronically Signed   By: Elsie Stain M.D.   On: 09/21/2018 15:52   Dg Chest 2 View  Result Date: 09/21/2018 CLINICAL DATA:  CVA EXAM: CHEST - 2 VIEW COMPARISON:  06/02/2018 FINDINGS: No focal opacity or pleural effusion. Cardiomediastinal silhouette within normal limits. Aortic atherosclerosis. No pneumothorax. IMPRESSION: No active cardiopulmonary disease. Electronically Signed   By: Jasmine Pang M.D.   On: 09/21/2018 20:25   Ct Angio Neck W Or Wo Contrast  Result Date: 09/21/2018 CLINICAL DATA:  Dizziness for 3 weeks with chronic cough. Headache and nausea. EXAM: CT ANGIOGRAPHY HEAD AND NECK TECHNIQUE: Multidetector CT imaging of the head and neck was performed using the standard protocol during bolus administration of intravenous contrast. Multiplanar CT image reconstructions and MIPs were obtained to evaluate the vascular anatomy. Carotid stenosis measurements (when applicable) are obtained utilizing NASCET criteria, using the distal internal carotid  diameter as the denominator. CONTRAST:  60mL OMNIPAQUE IOHEXOL 350 MG/ML SOLN COMPARISON:  None. FINDINGS: CT HEAD FINDINGS Brain: Asymmetric hypodensity involving the RIGHT posterior temporal lobe and RIGHT occipital lobe, no hemorrhage, consistent with acute to subacute infarction. No visible mass lesion, hydrocephalus, or extra-axial fluid. Normal for age cerebral volume. No white matter disease. Vascular: Reported separately. Skull: Normal. Negative for fracture or focal lesion. Sinuses: Imaged portions are clear. Orbits: No acute finding. Review of the MIP images confirms the above findings CTA NECK FINDINGS Aortic arch: Standard branching. Imaged portion shows no evidence of aneurysm or dissection. No significant stenosis of the major arch vessel origins. Aortic atherosclerosis. Right carotid system: No  evidence of dissection, stenosis (50% or greater) or occlusion. Minor calcific plaque at the bifurcation. Left carotid system: No evidence of dissection, stenosis (50% or greater) or occlusion. Vertebral arteries: BILATERAL patent. Minor ostial calcification on the LEFT, but no proximal stenosis. Skeleton: Spondylosis.  Missing teeth. Other neck: Noncontributory. Upper chest: Unremarkable. Review of the MIP images confirms the above findings CTA HEAD FINDINGS Anterior circulation: No significant stenosis, proximal occlusion, aneurysm, or vascular malformation. Posterior circulation: Basilar artery widely patent. Both vertebrals contribute to its formation equally. There is an abrupt occlusion of the RIGHT posterior cerebral artery in its proximal P2 segment. Venous sinuses: As permitted by contrast timing, patent. Anatomic variants: None of significance. Delayed phase: Not performed. 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 No hemorrhage. Abrupt truncation of the RIGHT  posterior cerebral artery in its proximal P2 segment. Given the lack of extracranial and intracranial atherosclerotic disease, this may be an embolic event. Findings discussed with ordering provider at the time of interpretation. Electronically Signed   By: Elsie StainJohn T Curnes M.D.   On: 09/21/2018 15:52   [place] Carotid Bilateral (at [facility] Only)  Result Date: 09/22/2018 CLINICAL DATA:  CVA and dizziness. EXAM: BILATERAL CAROTID DUPLEX ULTRASOUND TECHNIQUE: Wallace CullensGray scale imaging, color Doppler and duplex ultrasound were performed of bilateral carotid and vertebral arteries in the neck. COMPARISON:  12/12/2017 FINDINGS: Criteria: Quantification of carotid stenosis is based on velocity parameters that correlate the residual internal carotid diameter with NASCET-based stenosis levels, using the diameter of the distal internal carotid lumen as the denominator for stenosis measurement. The following velocity measurements were obtained: RIGHT ICA: 30 cm/sec CCA: 80 cm/sec SYSTOLIC ICA/CCA RATIO:  1.5 ECA: 121 cm/sec LEFT ICA: 141 cm/sec CCA: 101 cm/sec SYSTOLIC ICA/CCA RATIO:  1.1 ECA: 112 cm/sec RIGHT CAROTID ARTERY: Minimal smooth mixed plaque in the bulb. Low resistance internal carotid Doppler pattern is preserved. RIGHT VERTEBRAL ARTERY:  Antegrade. LEFT CAROTID ARTERY: Little if any plaque in the bulb. Low resistance internal carotid Doppler pattern is preserved. Mild calcified plaque in the lower internal carotid artery. LEFT VERTEBRAL ARTERY:  Antegrade. IMPRESSION: Less than 50% stenosis in the right and left internal carotid arteries. Electronically Signed   By: Jolaine ClickArthur  Hoss M.D.   On: 09/22/2018 11:33     Discharge Exam: Vitals:   09/23/18 0040 09/23/18 0800  BP: (!) 157/86 (!) 156/62  Pulse:  73  Resp: 16 16  Temp:  98.2 F (36.8 C)  SpO2: 97% 96%   Vitals:   09/22/18 1640 09/22/18 2040 09/23/18 0040 09/23/18 0800  BP: (!) 134/56 (!) 168/79 (!) 157/86 (!) 156/62  Pulse: 75 79  73  Resp:  17 16  16   Temp:  98.4 F (36.9 C)  98.2 F (36.8 C)  TempSrc:  Oral  Oral  SpO2: 96%  97% 96%  Weight:      Height:        General: Pt is alert, awake, not in acute distress Cardiovascular: RRR, S1/S2 +, no rubs, no gallops Respiratory: CTA bilaterally, no wheezing, no rhonchi Abdominal: Soft, NT, ND, bowel sounds + Extremities: no edema, no cyanosis    The results of significant diagnostics from this hospitalization (including imaging, microbiology, ancillary and laboratory) are listed below for reference.     Microbiology: No results found for this or any previous visit (from the past 240 hour(s)).   Labs:  BNP (last 3 results) No results for input(s): BNP in the last 8760 hours. Basic Metabolic Panel: Recent Labs  Lab 09/21/18 1341 09/21/18 1342 09/23/18 0629  NA  --  133* 132*  K  --  3.0* 3.0*  CL  --  96* 95*  CO2  --  26 28  GLUCOSE  --  126* 90  BUN  --  23 15  CREATININE 1.50* 1.46* 1.33*  CALCIUM  --  9.0 8.5*   Liver Function Tests: Recent Labs  Lab 09/21/18 1342  AST 37  ALT 21  ALKPHOS 66  BILITOT 0.7  PROT 6.7  ALBUMIN 3.8   No results for input(s): LIPASE, AMYLASE in the last 168 hours. No results for input(s): AMMONIA in the last 168 hours. CBC: Recent Labs  Lab 09/21/18 1342  WBC 6.2  NEUTROABS 4.1  HGB 11.6*  HCT 35.4*  MCV 91.2  PLT 329   Cardiac Enzymes: No results for input(s): CKTOTAL, CKMB, CKMBINDEX, TROPONINI in the last 168 hours. BNP: Invalid input(s): POCBNP CBG: No results for input(s): GLUCAP in the last 168 hours. D-Dimer No results for input(s): DDIMER in the last 72 hours. Hgb A1c Recent Labs    09/22/18 0432  HGBA1C 5.3   Lipid Profile Recent Labs    09/22/18 0432  CHOL 166  HDL 97  LDLCALC 61  TRIG 41  CHOLHDL 1.7   Thyroid function studies No results for input(s): TSH, T4TOTAL, T3FREE, THYROIDAB in the last 72 hours.  Invalid input(s): FREET3 Anemia work up No results for input(s): VITAMINB12,  FOLATE, FERRITIN, TIBC, IRON, RETICCTPCT in the last 72 hours. Urinalysis    Component Value Date/Time   COLORURINE STRAW (A) 09/21/2018 1320   APPEARANCEUR CLEAR 09/21/2018 1320   LABSPEC 1.019 09/21/2018 1320   PHURINE 6.0 09/21/2018 1320   GLUCOSEU NEGATIVE 09/21/2018 1320   HGBUR MODERATE (A) 09/21/2018 1320   BILIRUBINUR NEGATIVE 09/21/2018 1320   KETONESUR NEGATIVE 09/21/2018 1320   PROTEINUR NEGATIVE 09/21/2018 1320   NITRITE NEGATIVE 09/21/2018 1320   LEUKOCYTESUR SMALL (A) 09/21/2018 1320   Sepsis Labs Invalid input(s): PROCALCITONIN,  WBC,  LACTICIDVEN Microbiology No results found for this or any previous visit (from the past 240 hour(s)).   Time coordinating discharge: 35 minutes  SIGNED:   Erick Blinks, DO [facility] 09/23/2018, 10:01 AM  If 7PM-7AM, please contact night-coverage [email or web address] Password [email or web address]

## 2018-09-25 DIAGNOSIS — Z7982 Long term (current) use of aspirin: Secondary | ICD-10-CM | POA: Diagnosis not present

## 2018-09-25 DIAGNOSIS — E785 Hyperlipidemia, unspecified: Secondary | ICD-10-CM | POA: Diagnosis not present

## 2018-09-25 DIAGNOSIS — H9190 Unspecified hearing loss, unspecified ear: Secondary | ICD-10-CM | POA: Diagnosis not present

## 2018-09-25 DIAGNOSIS — Z791 Long term (current) use of non-steroidal anti-inflammatories (NSAID): Secondary | ICD-10-CM | POA: Diagnosis not present

## 2018-09-25 DIAGNOSIS — Z87891 Personal history of nicotine dependence: Secondary | ICD-10-CM | POA: Diagnosis not present

## 2018-09-25 DIAGNOSIS — Z7951 Long term (current) use of inhaled steroids: Secondary | ICD-10-CM | POA: Diagnosis not present

## 2018-09-25 DIAGNOSIS — M1990 Unspecified osteoarthritis, unspecified site: Secondary | ICD-10-CM | POA: Diagnosis not present

## 2018-09-25 DIAGNOSIS — R483 Visual agnosia: Secondary | ICD-10-CM | POA: Diagnosis not present

## 2018-09-25 DIAGNOSIS — I6621 Occlusion and stenosis of right posterior cerebral artery: Secondary | ICD-10-CM | POA: Diagnosis not present

## 2018-09-25 DIAGNOSIS — N183 Chronic kidney disease, stage 3 (moderate): Secondary | ICD-10-CM | POA: Diagnosis not present

## 2018-09-25 DIAGNOSIS — Z7902 Long term (current) use of antithrombotics/antiplatelets: Secondary | ICD-10-CM | POA: Diagnosis not present

## 2018-09-25 DIAGNOSIS — I129 Hypertensive chronic kidney disease with stage 1 through stage 4 chronic kidney disease, or unspecified chronic kidney disease: Secondary | ICD-10-CM | POA: Diagnosis not present

## 2018-09-26 ENCOUNTER — Other Ambulatory Visit: Payer: Self-pay | Admitting: *Deleted

## 2018-09-26 ENCOUNTER — Telehealth: Payer: Self-pay | Admitting: *Deleted

## 2018-09-26 ENCOUNTER — Telehealth: Payer: Self-pay | Admitting: Cardiovascular Disease

## 2018-09-26 DIAGNOSIS — M19041 Primary osteoarthritis, right hand: Secondary | ICD-10-CM

## 2018-09-26 DIAGNOSIS — I63531 Cerebral infarction due to unspecified occlusion or stenosis of right posterior cerebral artery: Secondary | ICD-10-CM

## 2018-09-26 DIAGNOSIS — M19042 Primary osteoarthritis, left hand: Secondary | ICD-10-CM

## 2018-09-26 DIAGNOSIS — I10 Essential (primary) hypertension: Secondary | ICD-10-CM

## 2018-09-26 NOTE — Telephone Encounter (Signed)
Virtual Visit Pre-Appointment Phone Call  "(Name), I am calling you today to discuss your upcoming appointment. We are currently trying to limit exposure to the virus that causes COVID-19 by seeing patients at home rather than in the office."  1. "What is the BEST phone number to call the day of the visit?" - include this in appointment notes  2. Do you have or have access to (through a family member/friend) a smartphone with video capability that we can use for your visit?" a. If yes - list this number in appt notes as cell (if different from BEST phone #) and list the appointment type as a VIDEO visit in appointment notes b. If no - list the appointment type as a PHONE visit in appointment notes  3. Confirm consent - "In the setting of the current Covid19 crisis, you are scheduled for a (phone or video) visit with your provider on (date) at (time).  Just as we do with many in-office visits, in order for you to participate in this visit, we must obtain consent.  If you'd like, I can send this to your mychart (if signed up) or email for you to review.  Otherwise, I can obtain your verbal consent now.  All virtual visits are billed to your insurance company just like a normal visit would be.  By agreeing to a virtual visit, we'd like you to understand that the technology does not allow for your provider to perform an examination, and thus may limit your provider's ability to fully assess your condition. If your provider identifies any concerns that need to be evaluated in person, we will make arrangements to do so.  Finally, though the technology is pretty good, we cannot assure that it will always work on either your or our end, and in the setting of a video visit, we may have to convert it to a phone-only visit.  In either situation, we cannot ensure that we have a secure connection.  Are you willing to proceed?" STAFF: Did the patient verbally acknowledge consent to telehealth visit? Document  YES/NO here: Yes  4. Advise patient to be prepared - "Two hours prior to your appointment, go ahead and check your blood pressure, pulse, oxygen saturation, and your weight (if you have the equipment to check those) and write them all down. When your visit starts, your provider will ask you for this information. If you have an Apple Watch or Kardia device, please plan to have heart rate information ready on the day of your appointment. Please have a pen and paper handy nearby the day of the visit as well."  5. Give patient instructions for MyChart download to smartphone OR Doximity/Doxy.me as below if video visit (depending on what platform provider is using)  6. Inform patient they will receive a phone call 15 minutes prior to their appointment time (may be from unknown caller ID) so they should be prepared to answer    TELEPHONE CALL NOTE  Pamela Hughes has been deemed a candidate for a follow-up tele-health visit to limit community exposure during the Covid-19 pandemic. I spoke with the patient via phone to ensure availability of phone/video source, confirm preferred email & phone number, and discuss instructions and expectations.  I reminded Pamela Hughes to be prepared with any vital sign and/or heart rhythm information that could potentially be obtained via home monitoring, at the time of her visit. I reminded Pamela Hughes to expect a phone call prior to  her visit.  [name] 09/26/2018 1:41 PM   INSTRUCTIONS FOR DOWNLOADING THE MYCHART APP TO SMARTPHONE  - The patient must first make sure to have activated MyChart and know their login information - If Apple, go to [employer] and type in MyChart in the search bar and download the app. If Android, ask patient to go to [employer] and type in [place] in the search bar and download the app. The app is free but as with any other app downloads, their phone may require them to verify saved payment information or Apple/Android  password.  - The patient will need to then log into the app with their MyChart username and password, and select [facility] as their healthcare provider to link the account. When it is time for your visit, go to the MyChart app, find appointments, and click Begin Video Visit. Be sure to Select Allow for your device to access the Microphone and Camera for your visit. You will then be connected, and your provider will be with you shortly.  **If they have any issues connecting, or need assistance please contact MyChart service desk [phone])**  **If using a computer, in order to ensure the best quality for their visit they will need to use either of the following Internet Browsers: [employer], or Google Chrome**  IF USING DOXIMITY or DOXY.ME - The patient will receive a link just prior to their visit by text.     FULL LENGTH CONSENT FOR TELE-HEALTH VISIT   I hereby voluntarily request, consent and authorize [facility] and its employed or contracted physicians, physician assistants, nurse practitioners or other licensed health care professionals (the Practitioner), to provide me with telemedicine health care services (the Services") as deemed necessary by the treating Practitioner. I acknowledge and consent to receive the Services by the Practitioner via telemedicine. I understand that the telemedicine visit will involve communicating with the Practitioner through live audiovisual communication technology and the disclosure of certain medical information by electronic transmission. I acknowledge that I have been given the opportunity to request an in-person assessment or other available alternative prior to the telemedicine visit and am voluntarily participating in the telemedicine visit.  I understand that I have the right to withhold or withdraw my consent to the use of telemedicine in the course of my care at any time, without affecting my right to future care or treatment,  and that the Practitioner or I may terminate the telemedicine visit at any time. I understand that I have the right to inspect all information obtained and/or recorded in the course of the telemedicine visit and may receive copies of available information for a reasonable fee.  I understand that some of the potential risks of receiving the Services via telemedicine include:   Delay or interruption in medical evaluation due to technological equipment failure or disruption;  Information transmitted may not be sufficient (e.g. poor resolution of images) to allow for appropriate medical decision making by the Practitioner; and/or   In rare instances, security protocols could fail, causing a breach of personal health information.  Furthermore, I acknowledge that it is my responsibility to provide information about my medical history, conditions and care that is complete and accurate to the best of my ability. I acknowledge that Practitioner's advice, recommendations, and/or decision may be based on factors not within their control, such as incomplete or inaccurate data provided by me or distortions of diagnostic images or specimens that may result from electronic transmissions. I  understand that the practice of medicine is not an exact science and that Practitioner makes no warranties or guarantees regarding treatment outcomes. I acknowledge that I will receive a copy of this consent concurrently upon execution via email to the email address I last provided but may also request a printed copy by calling the office of [place].    I understand that my insurance will be billed for this visit.   I have read or had this consent read to me.  I understand the contents of this consent, which adequately explains the benefits and risks of the Services being provided via telemedicine.   I have been provided ample opportunity to ask questions regarding this consent and the Services and have had my questions  answered to my satisfaction.  I give my informed consent for the services to be provided through the use of telemedicine in my medical care  By participating in this telemedicine visit I agree to the above.

## 2018-09-26 NOTE — Patient Outreach (Signed)
[facility]) Care Management  09/26/2018  Pamela Hughes February 09, 1941 [id number]   EMMI-general discharge from [facility]   RED ON EMMI ALERT Day # 1  Date: 09/25/18 1040   Red Alert Reason: Read discharge papers?  No Taking meds?  No  Insurance: united health care medicare   [facility] admissions x 1 ED visits x 1 in the last 6 months  Last admission 09/21/18 to 09/23/18 for CVA   Outreach attempt # 1 successful Patient is able to verify HIPAA [facility] Care Management RN reviewed and addressed red alert with patient   EMMI:  Pamela Hughes reports she has all her medications and is taking them but has not read her discharge instructions She reports the d/c instructions may be in her d/c bag of items. [facility] RN CM reviewed the d/c instructions/after summary instructions in Epic with her. CM discussed and answered questions related to the upcoming call from her MD office vs an office visit.  CM discussed with her that she will also get a call from the MD office embedded CM, K Hudy. She voices understanding of call from office RN CM and MD related to Covid pandemic precautions She denies any medical concerns on today  She is awaiting a call from [facility] PT staff. She was inform the staff would arrive on Wednesday. CM discussed with her that on tomorrow, Wednesday, 09/27/18, the [facility] PT would call prior to the arrival She voiced understanding    Social: Pamela Hughes is divorced with her daughter providing support and transportation assistance. She denies issues with her care needs.    Conditions: CVA, HTN, allergic rhinitis, colitis, alopecia, arthritis, osteoarthritis of both hands, depression, left foot pain,DOE (dyspnea on exertion) visual agnosia, chronic cough, former smoker  DME: dentures  Medications: She denies concerns with taking medications as prescribed, affording medications, side effects of medications and questions about medications    Appointments: 09/27/18 MD embedded office CM  and MD hospital f/u calls    Advance Directives: Denies need for assist with advance directives   Consent: [facility] RN CM reviewed [facility] services with patient. Patient gave verbal consent for services [facility] telephonic RN CM.   Advised patient that there will be further automated EMMI- post discharge calls to assess how the patient is doing following the recent hospitalization Advised the patient that another call may be received from a nurse if any of their responses were abnormal. Patient voiced understanding and was appreciative of f/u call.   Plan: [facility] RN CM will close case at this time as patient has been assessed and no needs identified/needs resolved.   Pt encouraged to return a call to [facility] RN CM prn  [facility] RN CM sent a successful outreach letter as discussed with [facility] brochure enclosed for review   L. Noelle Penner, RN, BSN, CCM [facility] Telephonic Care Management Care Coordinator Office number [phone] Mobile number [phone]  Main [facility] number [phone] Fax number [phone]

## 2018-09-26 NOTE — Telephone Encounter (Signed)
    TRANSITIONAL CARE MANAGEMENT TELEPHONE NOTE   Date: 09/26/2018 Contacted By: [name], RN-BC, BSN   1st unsuccessful TCM contact attempt.   I reached out to Pamela Hughes on her preferred telephone number to discuss Transitional Care Management, medication reconciliation, and to schedule a TCM hospital follow-up with her PCP at [facility].  Discharge Information Date of Discharge:09/23/2018    [facility] Discharge Diagnosis: CVA  Outpatient Follow Up Recommendations (from discharge summary)  1. Follow up with PCP in 1-2 weeks 2. Follow-up with Dr. Gerilyn Pilgrim with neurology in 6 weeks 3. Continue on aspirin and Plavix as recommended for 3 months 4. Continue on increased dose of statin as noted   Plan I left a HIPPA compliant message for her to return my call.  Will attempt to contact again within the 2 business day post discharge window if she does not return my call.

## 2018-09-27 ENCOUNTER — Ambulatory Visit: Payer: Medicare Other | Admitting: *Deleted

## 2018-09-27 ENCOUNTER — Other Ambulatory Visit: Payer: Self-pay

## 2018-09-27 ENCOUNTER — Ambulatory Visit: Payer: Medicare Other | Admitting: Family Medicine

## 2018-09-27 DIAGNOSIS — N183 Chronic kidney disease, stage 3 (moderate): Secondary | ICD-10-CM | POA: Diagnosis not present

## 2018-09-27 DIAGNOSIS — I129 Hypertensive chronic kidney disease with stage 1 through stage 4 chronic kidney disease, or unspecified chronic kidney disease: Secondary | ICD-10-CM | POA: Diagnosis not present

## 2018-09-27 DIAGNOSIS — R483 Visual agnosia: Secondary | ICD-10-CM | POA: Diagnosis not present

## 2018-09-27 DIAGNOSIS — I6621 Occlusion and stenosis of right posterior cerebral artery: Secondary | ICD-10-CM | POA: Diagnosis not present

## 2018-09-27 DIAGNOSIS — I10 Essential (primary) hypertension: Secondary | ICD-10-CM

## 2018-09-27 DIAGNOSIS — I63531 Cerebral infarction due to unspecified occlusion or stenosis of right posterior cerebral artery: Secondary | ICD-10-CM

## 2018-09-27 DIAGNOSIS — E785 Hyperlipidemia, unspecified: Secondary | ICD-10-CM | POA: Diagnosis not present

## 2018-09-27 DIAGNOSIS — M1990 Unspecified osteoarthritis, unspecified site: Secondary | ICD-10-CM | POA: Diagnosis not present

## 2018-09-27 DIAGNOSIS — Z7902 Long term (current) use of antithrombotics/antiplatelets: Secondary | ICD-10-CM | POA: Diagnosis not present

## 2018-09-27 DIAGNOSIS — R42 Dizziness and giddiness: Secondary | ICD-10-CM

## 2018-09-27 DIAGNOSIS — M19042 Primary osteoarthritis, left hand: Secondary | ICD-10-CM

## 2018-09-27 DIAGNOSIS — Z87891 Personal history of nicotine dependence: Secondary | ICD-10-CM | POA: Diagnosis not present

## 2018-09-27 DIAGNOSIS — M19041 Primary osteoarthritis, right hand: Secondary | ICD-10-CM

## 2018-09-27 DIAGNOSIS — Z7982 Long term (current) use of aspirin: Secondary | ICD-10-CM | POA: Diagnosis not present

## 2018-09-27 DIAGNOSIS — Z791 Long term (current) use of non-steroidal anti-inflammatories (NSAID): Secondary | ICD-10-CM | POA: Diagnosis not present

## 2018-09-27 DIAGNOSIS — Z7951 Long term (current) use of inhaled steroids: Secondary | ICD-10-CM | POA: Diagnosis not present

## 2018-09-27 DIAGNOSIS — H9190 Unspecified hearing loss, unspecified ear: Secondary | ICD-10-CM | POA: Diagnosis not present

## 2018-09-27 NOTE — Patient Instructions (Addendum)
Visit Information  Goals Addressed      Patient Stated   . "I need help with getting food delivered to my home." (pt-stated)       Current Barriers:  Marland Kitchen Knowledge Deficits related to resources available to deliver food to her home.  . Nurse Case Manager Clinical Goal(s): Over the next 7 days, patient will work with LCSW to address needs related to food delivery options.   Interventions:  . Provided education to patient re: ADTS and Meals On Wheels (waiting list) . Collaborated with Theadore Nan, LCSW regarding food resources.  . Discussed current food arrangements. She is signed up with [employer] in [place] and they deliver a food box. Her daughter and friend also brings things to her as needed.   Patient Self Care Activities:  . Currently UNABLE TO independently drive due to dizziness s/p CVA but she is able to warm up meals and feed herself.   Initial goal documentation      . "I'm still a little dizzy and I don't want to fall" (pt-stated)       Current Barriers:   Knowledge Deficits related to physiological changes associated with CVA that may increase dizziness and affect balance   No assistive devices at home  Nurse Case Manager Clinical Goal(s):  Marland Kitchen Over the next 7 days, patient will verbalize understanding of plan for fall risk reduction. . Over the next 7 days, patient will work with Consulting civil engineer and DME Supplier to address needs related to walker procurement  Interventions:  . Provided education to patient re: Fall risks in the home and risk reduction techniques. . Reviewed medications with patient and discussed antivert and scopalamine patches that were prescribed previously for dizziness. Also discussed her increased risk for bleeding with a fall/head injury while on Plavix.  . Collaborated with Jamelle Haring, LPN  regarding how to obtain a walker. Order will have to be completed at appt with PCP next week.   Patient Self Care Activities:  . Currently UNABLE TO  independently drive but she is able to do most ADLs independently   Initial goal documentation         The patient verbalized understanding of instructions provided today and declined a print copy of patient instruction materials.   The CM team will reach out to the patient again over the next 10 days.   Pamela Hughes was given information about Chronic Care Management services today including:  1. CCM service includes personalized support from designated clinical staff supervised by her physician, including individualized plan of care and coordination with other care providers 2. 24/7 contact phone numbers for assistance for urgent and routine care needs. 3. Service will only be billed when office clinical staff spend 20 minutes or more in a month to coordinate care. 4. Only one practitioner may furnish and bill the service in a calendar month. 5. The patient may stop CCM services at any time (effective at the end of the month) by phone call to the office staff. 6. The patient will be responsible for cost sharing (co-pay) of up to 20% of the service fee (after annual deductible is met).  Patient agreed to services and verbal consent obtained.  The patient verbalized understanding of instructions provided today and declined a print copy of patient instruction materials.    Pamela Sicilian, Pamela Hughes, Pamela Hughes Nurse Case Manager [facility] [phone]    EDUCATIONAL MATERIALS     Fall Prevention in the Home, Adult  Falls can cause injuries. They can happen to people of all ages. There are many things you can do to make your home safe and to help prevent falls. Ask for help when making these changes, if needed. What actions can I take to prevent falls? General Instructions  Use good lighting in all rooms. Replace any light bulbs that burn out.  Turn on the lights when you go into a dark area. Use night-lights.  Keep items that you use often in easy-to-reach places.  Lower the shelves around your home if necessary.  Set up your furniture so you have a clear path. Avoid moving your furniture around.  Do not have throw rugs and other things on the floor that can make you trip.  Avoid walking on wet floors.  If any of your floors are uneven, fix them.  Add color or contrast paint or tape to clearly mark and help you see: ? Any grab bars or handrails. ? First and last steps of stairways. ? Where the edge of each step is.  If you use a stepladder: ? Make sure that it is fully opened. Do not climb a closed stepladder. ? Make sure that both sides of the stepladder are locked into place. ? Ask someone to hold the stepladder for you while you use it.  If there are any pets around you, be aware of where they are. What can I do in the bathroom?      Keep the floor dry. Clean up any water that spills onto the floor as soon as it happens.  Remove soap buildup in the tub or shower regularly.  Use non-skid mats or decals on the floor of the tub or shower.  Attach bath mats securely with double-sided, non-slip rug tape.  If you need to sit down in the shower, use a plastic, non-slip stool.  Install grab bars by the toilet and in the tub and shower. Do not use towel bars as grab bars. What can I do in the bedroom?  Make sure that you have a light by your bed that is easy to reach.  Do not use any sheets or blankets that are too big for your bed. They should not hang down onto the floor.  Have a firm chair that has side arms. You can use this for support while you get dressed. What can I do in the kitchen?  Clean up any spills right away.  If you need to reach something above you, use a strong step stool that has a grab bar.  Keep electrical cords out of the way.  Do not use floor polish or wax that makes floors slippery. If you must use wax, use non-skid floor wax. What can I do with my stairs?  Do not leave any items on the stairs.  Make  sure that you have a light switch at the top of the stairs and the bottom of the stairs. If you do not have them, ask someone to add them for you.  Make sure that there are handrails on both sides of the stairs, and use them. Fix handrails that are broken or loose. Make sure that handrails are as long as the stairways.  Install non-slip stair treads on all stairs in your home.  Avoid having throw rugs at the top or bottom of the stairs. If you do have throw rugs, attach them to the floor with carpet tape.  Choose a carpet that does not hide the edge of  the steps on the stairway.  Check any carpeting to make sure that it is firmly attached to the stairs. Fix any carpet that is loose or worn. What can I do on the outside of my home?  Use bright outdoor lighting.  Regularly fix the edges of walkways and driveways and fix any cracks.  Remove anything that might make you trip as you walk through a door, such as a raised step or threshold.  Trim any bushes or trees on the path to your home.  Regularly check to see if handrails are loose or broken. Make sure that both sides of any steps have handrails.  Install guardrails along the edges of any raised decks and porches.  Clear walking paths of anything that might make someone trip, such as tools or rocks.  Have any leaves, snow, or ice cleared regularly.  Use sand or salt on walking paths during winter.  Clean up any spills in your garage right away. This includes grease or oil spills. What other actions can I take?  Wear shoes that: ? Have a low heel. Do not wear high heels. ? Have rubber bottoms. ? Are comfortable and fit you well. ? Are closed at the toe. Do not wear open-toe sandals.  Use tools that help you move around (mobility aids) if they are needed. These include: ? Canes. ? Walkers. ? Scooters. ? Crutches.  Review your medicines with your doctor. Some medicines can make you feel dizzy. This can increase your chance  of falling. Ask your doctor what other things you can do to help prevent falls. Where to find more information  Centers for Disease Control and Prevention, STEADI: https://garcia.biz/  [employer] on Aging: BrainJudge.co.uk Contact a doctor if:  You are afraid of falling at home.  You feel weak, drowsy, or dizzy at home.  You fall at home. Summary  There are many simple things that you can do to make your home safe and to help prevent falls.  Ways to make your home safe include removing tripping hazards and installing grab bars in the bathroom.  Ask for help when making these changes in your home. This information is not intended to replace advice given to you by your health care provider. Make sure you discuss any questions you have with your health care provider. Document Released: 03/13/2009 Document Revised: 12/30/2016 Document Reviewed: 12/30/2016 Elsevier Interactive Patient Education  2019 [employer] Address: [address] [place], [place] [address] Phone: [phone] Email: [email or web address]   Meals on Wheels is a service of ADTS that allows seniors to live more nourished and independent lives by providing nutritious food, a safety check and a daily visit to [facility] seniors. The program is much more than a meal as it aims to increase quality of life, reduce unnecessary ER visits, hospitalizations and premature institutionalization. Meals on Wheels is a community based program, relying on more than 400 volunteers to ensure meals are delivered Monday-Friday all throughout [facility]. We have two delivery programs, dependent upon location:  Inside the [place] meals are delivered Monday - Friday before the noontime hour.  Outside the [place] will be placed on our frozen meal program which  has distribution points throughout the county. Meals are picked up by a family member or delivered by a volunteer every two weeks.   Program Eligibility  In order to qualify for the Meals on Wheels program you must meet the following criteria:  - [facility] resident  - 19 years of age or older  - Homebound, except for medical appointments  - Experiencing difficulty with meal preparation  - Lack anyone willing or able to provide a noonday meal   As the demand for meals increases and the population of seniors grows, we maintain a waiting list for the Meals on Wheels service. We are thankful for all the community support that helps fund this program. The ADTS Meals on Wheels program proudly receives funding and support from the [place] [employer] [place]!   Private Pay Meals on Wheels Program  Are you or your loved one having trouble preparing meals? Are you worried about yourself or your loved one living alone with no one to check in with them throughout the day? Need a quick solution, without a lengthy waiting list? You and your family can rest easy knowing the security offered by a hot, nutritious meal delivered to your door along with a daily safety check by a caring volunteer is only a phone call away with our Meals on [employer] Pay program. Our affordable rates average about $5.00/meal.   Contact us at [phone] or [email or web address] to get started with Meals on Wheels today!   Special Note: People choosing to join the Private Pay program can elect to go on the Meals on Wheels waiting list and be transitioned to funded meals when their name reaches the top.  (*Information pulled from ADTS website: HDMode.be)

## 2018-09-28 ENCOUNTER — Telehealth: Payer: Self-pay | Admitting: *Deleted

## 2018-09-28 ENCOUNTER — Ambulatory Visit (INDEPENDENT_AMBULATORY_CARE_PROVIDER_SITE_OTHER): Payer: Medicare Other | Admitting: Licensed Clinical Social Worker

## 2018-09-28 DIAGNOSIS — R483 Visual agnosia: Secondary | ICD-10-CM

## 2018-09-28 DIAGNOSIS — M19042 Primary osteoarthritis, left hand: Secondary | ICD-10-CM | POA: Diagnosis not present

## 2018-09-28 DIAGNOSIS — E785 Hyperlipidemia, unspecified: Secondary | ICD-10-CM

## 2018-09-28 DIAGNOSIS — I10 Essential (primary) hypertension: Secondary | ICD-10-CM | POA: Diagnosis not present

## 2018-09-28 DIAGNOSIS — M19041 Primary osteoarthritis, right hand: Secondary | ICD-10-CM | POA: Diagnosis not present

## 2018-09-28 DIAGNOSIS — R42 Dizziness and giddiness: Secondary | ICD-10-CM

## 2018-09-28 DIAGNOSIS — I63531 Cerebral infarction due to unspecified occlusion or stenosis of right posterior cerebral artery: Secondary | ICD-10-CM

## 2018-09-28 DIAGNOSIS — R0602 Shortness of breath: Secondary | ICD-10-CM

## 2018-09-28 NOTE — Chronic Care Management (AMB) (Signed)
  Care Management Note   Pamela Hughes is a 78 y.o. year old female who is a primary care patient of Dettinger, Elige Radon, MD. The CM team was consulted for assistance with chronic disease management and care coordination.   I reached out to [name] by phone today.   Review of patient status, including review of consultants reports, relevant laboratory and other test results, and collaboration with appropriate care team members and the patient's provider was performed as part of comprehensive patient evaluation and provision of chronic care management services.   Social Determinants of Health:  Risk for food insecurity.Financial challenges related to hospital bills owed.   Goals Addressed            This Visit's Progress   . "I need help with getting food delivered to my home." (pt-stated)       Current Barriers:  Marland Kitchen Knowledge Deficits related to resources available to deliver food to her home.  . Nurse Case Manager Clinical Goal(s): Over the next 7 days, patient will work with LCSW to address needs related to food delivery options.   Interventions:  . LCSW provided education to patient re: ADTS and Meals On Wheels (waiting list) . Encouraged client to call ADTS to add her name to waiting list for Meals on Wheels.  . Discussed current food arrangements. She is signed up with [employer] in [place] and they deliver a food box. Her daughter and friend also brings things to her as needed.  Marland Kitchen LCSW spoke with client about client food needs and meals preparation  Patient Self Care Activities:  Currently UNABLE TO independently drive due to dizziness s/p CVA but she is able to warm up meals and feed herself.    Client said she has a friend, [name], who occasionally helps client with meals  Client receives some food monthly from [employer] in [place], [place]  Plan: Client has appointment with cardiologist on 10/02/2018. LCSW to call client in next 3 weeks to talk with client about food needs of  clinet and about local food resources Client to participate in home health physical therapy sessions as scheduled Client to communicate with RN CM [name] to discuss nursing needs of client .  Initial goal documentation     Follow Up Plan: LCSW to call client in next 3 weeks to talk with client about food needs of client and food resources for client in the area.  [name].[name] MSW, LCSW Licensed Clinical Social Worker [facility] Care Management [phone]

## 2018-09-28 NOTE — Patient Instructions (Signed)
Licensed Clinical Social Worker Visit Information  Goals we discussed today:  Goals Addressed            This Visit's Progress   . "I need help with getting food delivered to my home." (pt-stated)       Current Barriers:  Marland Kitchen Knowledge Deficits related to resources available to deliver food to her home.  . Nurse Case Manager Clinical Goal(s): Over the next 7 days, patient will work with LCSW to address needs related to food delivery options.   Interventions:  . LCSW provided education to patient re: ADTS and Meals On Wheels (waiting list) . Encouraged client to call ADTS to add her name to waiting list for Meals on Wheels.  . Discussed current food arrangements. She is signed up with [employer] in [place] and they deliver a food box. Her daughter and friend also brings things to her as needed.  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    Client said she has a friend, [name], who occasionally helps client with meals  Client receives food on monthly basis from [address] in [place], [place]: Client has appointment with cardiologist on 10/02/2018. LCSW to call client in next 3 weeks to talk with client about food needs of client and about local food resources in the area Client to participate in home health physical therapy sessions as scheduled Client to communicate with RN CM [name] to discuss nursing needs of client   Initial goal documentation      Materials provided: No  Ms. Gilday was given information about Chronic Care Management services today including:  1. CCM service includes personalized support from designated clinical staff supervised by her physician, including individualized plan of care and coordination with other care providers 2. 24/7 contact phone numbers for assistance for urgent and routine care  needs. 3. Service will only be billed when office clinical staff spend 20 minutes or more in a month to coordinate care. 4. Only one practitioner may furnish and bill the service in a calendar month. 5. The patient may stop CCM services at any time (effective at the end of the month) by phone call to the office staff. 6. The patient will be responsible for cost sharing (co-pay) of up to 20% of the service fee (after annual deductible is met).  Patient agreed to services and verbal consent obtained.    Follow Up Plan ; LCSW to call client in next 3 weeks to talk with client about food needs of client and about food resources for client in the area.  The patient verbalized understanding of instructions provided today and declined a print copy of patient instruction materials.   [name].Toniann Dickerson MSW, LCSW Licensed Clinical Social Worker [facility] [phone]

## 2018-09-28 NOTE — Telephone Encounter (Signed)
TRANSITIONAL CARE MANAGEMENT TELEPHONE NOTE   Contact Date: 09/27/2018 Contacted By: [name], RN-BC, BSN   DISCHARGE INFORMATION Date of Discharge:09/23/2018 [facility] Discharge Diagnosis: CVA  Outpatient Follow Up Recommendations (from discharge summary)  1. Follow up with PCP in 1-2 weeks 2. Follow-up with Dr. Gerilyn Pilgrim with neurology in 6 weeks 3. Continue on aspirin and Plavix as recommended for 3 months 4. Continue on increased dose of statin as noted   Pamela Hughes is a female primary care patient of Dettinger, Elige Radon, MD. An outgoing telephone call was made today and I spoke with Pamela Hughes.  Her condition(s) and treatment(s) were discussed.  ACTIVITIES OF DAILY LIVING  Pamela Hughes lives alone and she can perform ADLs independently although she is still having some dizziness which makes things a little more difficult. She is not driving right now and depends on her daughter and friends to deliver food and other necessities. They are consistently available to do this.    Home Health Services she is receiving home health Physical therapy services.    MEDICATION RECONCILIATION she has been able to pick-up all prescribed discharge medications from the pharmacy.   A post discharge medication reconciliation was performed and the complete medication list was reviewed with the patient/caregiver and is current as of 09/28/2018. Patient reports the following changes:  Plavix was started  Current Medication List    Accurate as of September 28, 2018  8:45 AM. Always use your most recent med list.        albuterol 108 (90 Base) MCG/ACT inhaler Commonly known as:  VENTOLIN HFA Inhale 2 puffs into the lungs every 6 (six) hours as needed for wheezing or shortness of breath.   aspirin 81 MG EC tablet Take 1 tablet (81 mg total) by mouth daily for 30 days.   buPROPion 150 MG 12 hr tablet Commonly known as:  WELLBUTRIN SR Take 1 tablet (150 mg  total) by mouth 2 (two) times daily. (Needs to be seen before next refill)   chlorthalidone 25 MG tablet Commonly known as:  HYGROTON Take 1 tablet (25 mg total) by mouth daily. (Needs to be seen before next refill)   clopidogrel 75 MG tablet      Newly Prescribed Commonly known as:  PLAVIX Take 1 tablet (75 mg total) by mouth daily for 30 days.   clotrimazole 10 MG troche Commonly known as:  MYCELEX DISSOLVE 1 TABLET IN MOUTH 5 TIMES DAILY   gabapentin 100 MG capsule Commonly known as:  Neurontin Take 1 capsule (100 mg total) by mouth 3 (three) times daily. One three times daily   ipratropium 0.03 % nasal spray Commonly known as:  ATROVENT Place 1 spray into both nostrils 2 (two) times daily.   meclizine 25 MG tablet Commonly known as:  ANTIVERT Take 1 tablet by mouth 3 (three) times daily as needed.   meloxicam 7.5 MG tablet Commonly known as:  MOBIC Take 1 tablet by mouth daily as needed.   omeprazole 20 MG capsule Commonly known as:  PRILOSEC Take 1 capsule (20 mg total) by mouth daily. (Needs to be seen before next refill)   pantoprazole 40 MG tablet Commonly known as:  Protonix Take 1 tablet (40 mg total) by mouth daily. Take 30-60 min before first meal of the day   rosuvastatin 20 MG tablet  Dose Increased Commonly known as:  CRESTOR Take 1 tablet (20 mg total) by mouth daily at 6 PM  for 30 days.   scopolamine 1 MG/3DAYS Commonly known as:  Transderm-Scop (1.5 MG) Place 1 patch (1.5 mg total) onto the skin every 3 (three) days.   traZODone 150 MG tablet Commonly known as:  DESYREL Take 1 tablet (150 mg total) by mouth at bedtime. (Needs to be seen before next refill)        PATIENT EDUCATION & FOLLOW-UP PLAN  An appointment for Transitional Care Management is scheduled with Dettinger, Elige RadonJoshua A, MD on 10/04/18 at 2:10.  Take all medications as prescribed  Move carefully to avoid falls  Contact our office by calling [phone] if you have any  questions or concerns

## 2018-09-28 NOTE — Chronic Care Management (AMB) (Addendum)
Chronic Care Management   Initial Telephone Consultation  09/27/2018 Name: Pamela Hughes MRN: [id number] DOB: [birth year]  Referred by: Dettinger, Fransisca Kaufmann, MD Reason for referral : Chronic Care Management (RN Consult)   Pamela Hughes is a 78 y.o. year old female who is a primary care patient of Dettinger, Fransisca Kaufmann, MD. The CCM team was consulted for assistance with chronic disease management and care coordination needs.   Review of patient status, including review of consultants reports, relevant laboratory and other test results, and collaboration with appropriate care team members and the patient's provider was performed as part of comprehensive patient evaluation and provision of chronic care management services.    I spoke with Ms Pamela Hughes by telephone as a follow up to her recent hospitalization. She was discharged from [facility] on 09/23/2018 s/p CVA. She has a recent history of increased dizziness and falls and believes that her initial stroke was several weeks ago while at home in her kitchen. She fell to the ground but did not lose consciousness. This was a witnessed event and she recovered without going to the ED for evaluation. She did follow up at her PCP office on 09/19/2018 and was treated for dizziness, sinusitis, and headache. This initial consult was to follow up on her current state of health and to offer CCM services.    SDOH (Social Determinants of Health) screening performed today. See Care Plan Entry related to challenges with: Social Connections Stress Physical Activity  Objective:   Goals Addressed      Patient Stated   . "I need help with getting food delivered to my home." (pt-stated)       Current Barriers:  Marland Kitchen Knowledge Deficits related to resources available to deliver food to her home.  . Nurse Case Manager Clinical Goal(s): Over the next 7 days, patient will work with LCSW to address needs related to food delivery options.   Interventions:  . Provided  education to patient re: ADTS and Meals On Wheels (waiting list) . Collaborated with Pamela Nan, LCSW regarding food resources.  . Discussed current food arrangements. She is signed up with [employer] in [place] and they deliver a food box. Her daughter and friend also brings things to her as needed.   Patient Self Care Activities:  . Currently UNABLE TO independently drive due to dizziness s/p CVA but she is able to warm up meals and feed herself.     Marland Kitchen "I'm still a little dizzy and I don't want to fall" (pt-stated)       Current Barriers:   Knowledge Deficits related to physiological changes associated with CVA that may increase dizziness and affect balance   No assistive devices at home  Nurse Case Manager Clinical Goal(s):  Marland Kitchen Over the next 7 days, patient will verbalize understanding of plan for fall risk reduction. . Over the next 7 days, patient will work with Consulting civil engineer and DME Supplier to address needs related to walker procurement  Interventions:  . Provided education to patient re: Fall risks in the home and risk reduction techniques. . Reviewed medications with patient and discussed antivert and scopalamine patches that were prescribed previously for dizziness. Also discussed her increased risk for bleeding with a fall/head injury while on Plavix.  . Collaborated with Pamela Haring, LPN  regarding how to obtain a walker.Order will have to filled out at appointment with PCP next week.   Patient Self Care Activities:  . Currently UNABLE TO independently drive but  she is able to do most ADLs independently          Ms. Dost was given information about Chronic Care Management services today including:  1. CCM service includes personalized support from designated clinical staff supervised by her physician, including individualized plan of care and coordination with other care providers 2. 24/7 contact phone numbers for assistance for urgent and routine care needs. 3. Service  will only be billed when office clinical staff spend 20 minutes or more in a month to coordinate care. 4. Only one practitioner may furnish and bill the service in a calendar month. 5. The patient may stop CCM services at any time (effective at the end of the month) by phone call to the office staff. 6. The patient will be responsible for cost sharing (co-pay) of up to 20% of the service fee (after annual deductible is met).  Patient agreed to services and verbal consent obtained.   The patient verbalized understanding of CCM services and consent and declined a print copy of material.   The CM team will reach out to the patient again over the next 10 days.  Next PCP appointment scheduled for: 10/04/2018 with Dr Dettinger   [name], RN-BC, BSN Nurse Case Manager [place] [phone]

## 2018-10-02 ENCOUNTER — Telehealth (INDEPENDENT_AMBULATORY_CARE_PROVIDER_SITE_OTHER): Payer: Medicare Other | Admitting: Cardiovascular Disease

## 2018-10-02 ENCOUNTER — Encounter: Payer: Self-pay | Admitting: Cardiovascular Disease

## 2018-10-02 VITALS — BP 110/65 | HR 84 | Ht 65.5 in | Wt 134.0 lb

## 2018-10-02 DIAGNOSIS — H9190 Unspecified hearing loss, unspecified ear: Secondary | ICD-10-CM | POA: Diagnosis not present

## 2018-10-02 DIAGNOSIS — R483 Visual agnosia: Secondary | ICD-10-CM | POA: Diagnosis not present

## 2018-10-02 DIAGNOSIS — N183 Chronic kidney disease, stage 3 (moderate): Secondary | ICD-10-CM | POA: Diagnosis not present

## 2018-10-02 DIAGNOSIS — I350 Nonrheumatic aortic (valve) stenosis: Secondary | ICD-10-CM | POA: Diagnosis not present

## 2018-10-02 DIAGNOSIS — E785 Hyperlipidemia, unspecified: Secondary | ICD-10-CM | POA: Diagnosis not present

## 2018-10-02 DIAGNOSIS — Z7902 Long term (current) use of antithrombotics/antiplatelets: Secondary | ICD-10-CM | POA: Diagnosis not present

## 2018-10-02 DIAGNOSIS — I10 Essential (primary) hypertension: Secondary | ICD-10-CM

## 2018-10-02 DIAGNOSIS — I6523 Occlusion and stenosis of bilateral carotid arteries: Secondary | ICD-10-CM

## 2018-10-02 DIAGNOSIS — M1990 Unspecified osteoarthritis, unspecified site: Secondary | ICD-10-CM | POA: Diagnosis not present

## 2018-10-02 DIAGNOSIS — Z7982 Long term (current) use of aspirin: Secondary | ICD-10-CM | POA: Diagnosis not present

## 2018-10-02 DIAGNOSIS — Z87891 Personal history of nicotine dependence: Secondary | ICD-10-CM | POA: Diagnosis not present

## 2018-10-02 DIAGNOSIS — I6621 Occlusion and stenosis of right posterior cerebral artery: Secondary | ICD-10-CM | POA: Diagnosis not present

## 2018-10-02 DIAGNOSIS — Z791 Long term (current) use of non-steroidal anti-inflammatories (NSAID): Secondary | ICD-10-CM | POA: Diagnosis not present

## 2018-10-02 DIAGNOSIS — I639 Cerebral infarction, unspecified: Secondary | ICD-10-CM

## 2018-10-02 DIAGNOSIS — Z7951 Long term (current) use of inhaled steroids: Secondary | ICD-10-CM | POA: Diagnosis not present

## 2018-10-02 DIAGNOSIS — I129 Hypertensive chronic kidney disease with stage 1 through stage 4 chronic kidney disease, or unspecified chronic kidney disease: Secondary | ICD-10-CM | POA: Diagnosis not present

## 2018-10-02 NOTE — Patient Instructions (Signed)

## 2018-10-02 NOTE — Progress Notes (Signed)
Virtual Visit via Telephone Note   This visit type was conducted due to national recommendations for restrictions regarding the COVID-19 Pandemic (e.g. social distancing) in an effort to limit this patient's exposure and mitigate transmission in our community.  Due to her co-morbid illnesses, this patient is at least at moderate risk for complications without adequate follow up.  This format is felt to be most appropriate for this patient at this time.  The patient did not have access to video technology/had technical difficulties with video requiring transitioning to audio format only (telephone).  All issues noted in this document were discussed and addressed.  No physical exam could be performed with this format.  Please refer to the patient's chart for her  consent to telehealth for [facility].   Date:  10/02/2018   ID:  Pamela Hughes, DOB 30-May-1941, MRN [id number]  Patient Location: Home Provider Location: Home  PCP:  Dettinger, Elige Radon, MD  Cardiologist:  No primary care provider on file.  Electrophysiologist:  None   Evaluation Performed:  Follow-Up Visit  Chief Complaint:  CVA  History of Present Illness:    Pamela Hughes is a 78 y.o. female with a recent large right occiptial CVA.  She has some dizziness and nausea. She's also had some vision and balance issues. She had some chest pains a few weeks ago which was alleviated with coughing. She'd had some shortness of breath. She denies leg swelling.  The patient does not have symptoms concerning for COVID-19 infection (fever, chills, cough, or new shortness of breath).    Past Medical History:  Diagnosis Date  . Allergy   . Alopecia   . Anxiety   . Arthritis   . Asthma   . Collagenous colitis   . Depression   . Diarrhea    chronic- "states for at least a year)  . Edema   . GERD (gastroesophageal reflux disease)   . HOH (hard of hearing)   . Hypertension    Past Surgical History:  Procedure Laterality Date   . ABDOMINAL HYSTERECTOMY     one ovary left  . CATARACT EXTRACTION W/PHACO Right 02/17/2018   Procedure: CATARACT EXTRACTION PHACO AND INTRAOCULAR LENS PLACEMENT (IOC);  Surgeon: Fabio Pierce, MD;  Location: [facility];  Service: Ophthalmology;  Laterality: Right;  CDE: 4.84  . CATARACT EXTRACTION W/PHACO Left 03/31/2018   Procedure: CATARACT EXTRACTION PHACO AND INTRAOCULAR LENS PLACEMENT (IOC) LEFT CDE: 4.51;  Surgeon: Fabio Pierce, MD;  Location: [facility];  Service: Ophthalmology;  Laterality: Left;  . COLONOSCOPY N/A 12/17/2016   Procedure: COLONOSCOPY;  Surgeon: Malissa Hippo, MD;  Location: [facility] SU[facility];  Service: Endoscopy;  Laterality: N/A;  2:05  . SPINE SURGERY  1990s   L spine     Current Meds  Medication Sig  . albuterol (PROVENTIL HFA;VENTOLIN HFA) 108 (90 Base) MCG/ACT inhaler Inhale 2 puffs into the lungs every 6 (six) hours as needed for wheezing or shortness of breath.  Marland Kitchen aspirin EC 81 MG EC tablet Take 1 tablet (81 mg total) by mouth daily for 30 days.  Marland Kitchen buPROPion (WELLBUTRIN SR) 150 MG 12 hr tablet Take 1 tablet (150 mg total) by mouth 2 (two) times daily. (Needs to be seen before next refill)  . chlorthalidone (HYGROTON) 25 MG tablet Take 1 tablet (25 mg total) by mouth daily. (Needs to be seen before next refill)  . clopidogrel (PLAVIX) 75 MG tablet Take 1 tablet (75 mg total) by mouth daily for  30 days.  . clotrimazole (MYCELEX) 10 MG troche DISSOLVE 1 TABLET IN MOUTH 5 TIMES DAILY  . gabapentin (NEURONTIN) 100 MG capsule Take 1 capsule (100 mg total) by mouth 3 (three) times daily. One three times daily  . ipratropium (ATROVENT) 0.03 % nasal spray Place 1 spray into both nostrils 2 (two) times daily.   . meclizine (ANTIVERT) 25 MG tablet Take 1 tablet by mouth 3 (three) times daily as needed.  . meloxicam (MOBIC) 7.5 MG tablet Take 1 tablet by mouth daily as needed.   Marland Kitchen. omeprazole (PRILOSEC) 20 MG capsule Take 1 capsule (20 mg total) by mouth daily. (Needs to be seen  before next refill)  . pantoprazole (PROTONIX) 40 MG tablet Take 1 tablet (40 mg total) by mouth daily. Take 30-60 min before first meal of the day  . rosuvastatin (CRESTOR) 20 MG tablet Take 1 tablet (20 mg total) by mouth daily at 6 PM for 30 days.  Marland Kitchen. scopolamine (TRANSDERM-SCOP, 1.5 MG,) 1 MG/3DAYS Place 1 patch (1.5 mg total) onto the skin every 3 (three) days.  . traZODone (DESYREL) 150 MG tablet Take 1 tablet (150 mg total) by mouth at bedtime. (Needs to be seen before next refill)     Allergies:   Patient has no known allergies.   Social History   Tobacco Use  . Smoking status: Former Smoker    Packs/day: 0.25    Years: 5.00    Pack years: 1.25    Types: Cigarettes    Last attempt to quit: 10/09/1974    Years since quitting: 44.0  . Smokeless tobacco: Never Used  Substance Use Topics  . Alcohol use: Yes    Hughes: glass of wine occasionally  . Drug use: No     Family Hx: The patient's family history includes Alcohol abuse in her father; Alzheimer's disease in her mother; Diabetes in her brother; Healthy in her daughter.  ROS:   Please see the history of present illness.     All other systems reviewed and are negative.   Prior CV studies:   The following studies were reviewed today:  Echo (09/22/18):  IMPRESSIONS    1. The left ventricle has normal systolic function with an ejection fraction of 60-65%. The cavity size was normal. There is mild concentric left ventricular hypertrophy. Left ventricular diastolic Doppler parameters are indeterminate. Indeterminate  filling pressures No evidence of left ventricular regional wall motion abnormalities.  2. The right ventricle has normal systolic function. The cavity was normal. There is no increase in right ventricular wall thickness.  3. The mitral valve is grossly normal.  4. The tricuspid valve is grossly normal.  5. The aortic valve is tricuspid. Mild thickening of the aortic valve. Mild calcification of the  aortic valve. Aortic valve regurgitation is mild by color flow Doppler. Mild stenosis of the aortic valve.  6. Aortic annular calcification.  7. The aortic root is normal in size and structure.  8. No atrial level shunt detected by color flow Doppler. Agitated saline contrast was given intravenously to evaluate for intracardiac shunting. Saline contrast bubble study was negative, with no evidence of any interatrial shunt.  Carotid Doppler (09/22/18):  IMPRESSION: Less than 50% stenosis in the right and left internal carotid arteries.  Labs/Other Tests and Data Reviewed:    EKG:  No ECG reviewed.  Recent Labs: 09/21/2018: ALT 21; Hemoglobin 11.6; Platelets 329 09/23/2018: BUN 15; Creatinine, Ser 1.33; Potassium 3.0; Sodium 132   Recent Lipid Panel Lab Results  Component Value Date/Time   CHOL 166 09/22/2018 04:32 AM   CHOL 269 (H) 07/11/2017 03:55 PM   TRIG 41 09/22/2018 04:32 AM   HDL 97 09/22/2018 04:32 AM   HDL 105 07/11/2017 03:55 PM   CHOLHDL 1.7 09/22/2018 04:32 AM   LDLCALC 61 09/22/2018 04:32 AM   LDLCALC 145 (H) 07/11/2017 03:55 PM   LDLDIRECT 165 (H) 07/11/2017 03:55 PM    Wt Readings from Last 3 Encounters:  10/02/18 134 lb (60.8 kg)  09/22/18 141 lb 5 oz (64.1 kg)  06/30/18 140 lb 6.4 oz (63.7 kg)     Objective:    Vital Signs:  BP 110/65   Pulse 84   Ht 5' 5.5" (1.664 m)   Wt 134 lb (60.8 kg)   BMI 21.96 kg/m    VITAL SIGNS:  reviewed  ASSESSMENT & PLAN:    1. Aortic stenosis: Mild in severity by echo on 09/22/18. Will plan to repeat in 2 years.  2.  Hypertension: Blood pressure is normal. No changes to therapy.  3. Bilateral carotid artery stenosis:  <50% b/l on 09/22/18 Dopplers. On statin.  4.  Hypercholesterolemia: Continue statin.LDL 61 on 09/22/18.  5. CVA: Continue DAPT with ASA and Plavix x 3 months per neurology. Also on rosuvastatin 20 mg.   COVID-19 Education: The signs and symptoms of COVID-19 were discussed with the patient and  how to seek care for testing (follow up with PCP or arrange E-visit).  The importance of social distancing was discussed today.  Time:   Today, I have spent 15 minutes with the patient with telehealth technology discussing the above problems.     Medication Adjustments/Labs and Tests Ordered: Current medicines are reviewed at length with the patient today.  Concerns regarding medicines are outlined above.   Tests Ordered: No orders of the defined types were placed in this encounter.   Medication Changes: No orders of the defined types were placed in this encounter.   Disposition:  Follow up in 6 month(s)  Signed, Prentice Docker, MD  10/02/2018 1:03 PM    [facility]

## 2018-10-03 ENCOUNTER — Telehealth: Payer: Medicare Other | Admitting: *Deleted

## 2018-10-04 ENCOUNTER — Other Ambulatory Visit: Payer: Self-pay

## 2018-10-04 ENCOUNTER — Encounter: Payer: Self-pay | Admitting: Family Medicine

## 2018-10-04 ENCOUNTER — Ambulatory Visit (INDEPENDENT_AMBULATORY_CARE_PROVIDER_SITE_OTHER): Payer: Medicare Other | Admitting: Family Medicine

## 2018-10-04 VITALS — BP 157/71 | HR 78 | Temp 97.9°F | Ht 65.5 in | Wt 136.6 lb

## 2018-10-04 DIAGNOSIS — I10 Essential (primary) hypertension: Secondary | ICD-10-CM | POA: Diagnosis not present

## 2018-10-04 DIAGNOSIS — E876 Hypokalemia: Secondary | ICD-10-CM

## 2018-10-04 DIAGNOSIS — Z9181 History of falling: Secondary | ICD-10-CM

## 2018-10-04 DIAGNOSIS — R42 Dizziness and giddiness: Secondary | ICD-10-CM | POA: Diagnosis not present

## 2018-10-04 DIAGNOSIS — Z8673 Personal history of transient ischemic attack (TIA), and cerebral infarction without residual deficits: Secondary | ICD-10-CM

## 2018-10-04 DIAGNOSIS — N179 Acute kidney failure, unspecified: Secondary | ICD-10-CM

## 2018-10-04 DIAGNOSIS — H539 Unspecified visual disturbance: Secondary | ICD-10-CM

## 2018-10-04 MED ORDER — METOPROLOL SUCCINATE ER 25 MG PO TB24: 25.0000 mg | ORAL_TABLET | Freq: Every day | ORAL | 3 refills | Status: DC

## 2018-10-04 MED ORDER — TRIAMCINOLONE ACETONIDE 0.1 % EX CREA: 1.0000 "application " | TOPICAL_CREAM | Freq: Two times a day (BID) | CUTANEOUS | 0 refills | Status: DC

## 2018-10-04 NOTE — Progress Notes (Signed)
BP (!) 157/71   Pulse 78   Temp 97.9 F (36.6 C) (Oral)   Ht 5' 5.5" (1.664 m)   Wt 136 lb 9.6 oz (62 kg)   BMI 22.39 kg/m    Subjective:   Patient ID: Pamela Hughes, female    DOB: June 27, 1940, 78 y.o.   MRN: [id number]  HPI: Pamela Hughes is a 78 y.o. female presenting on 10/04/2018 for Hospitalization Follow-up (CVA 4/23- AP- Rx for walker ); Dizziness; and Nausea   HPI Patient is coming in for hospital follow-up for CVA and dizziness and nausea Patient was in the hospital from 4/23 until 09/23/2018, patient has been contacted by CCM in our office for transition of care on 09/28/2018.  Patient has been still having dizziness and some visual deficits since the stroke and their risk for fall and concern that she could fall because she is more unsteady on her feet because of the dizziness since the stroke.  She denies any numbness or weakness or any other signs except for the dizziness and vision changes from the stroke.  She denies any fevers or chills or cough or congestion.  She was started in the hospital on aspirin and Plavix and has been doing well on those.  She has been scanned with both an ultrasound of her carotids and an echocardiogram which did not show any possible cause for the stroke.  She has follow-up with neurology as well.  Medications reviewed  Relevant past medical, surgical, family and social history reviewed and updated as indicated. Interim medical history since our last visit reviewed. Allergies and medications reviewed and updated.  Review of Systems  Constitutional: Negative for chills and fever.  HENT: Negative for congestion, ear discharge and ear pain.   Eyes: Positive for visual disturbance. Negative for redness.  Respiratory: Negative for chest tightness and shortness of breath.   Cardiovascular: Negative for chest pain and leg swelling.  Genitourinary: Negative for difficulty urinating and dysuria.  Musculoskeletal: Positive for gait problem. Negative  for back pain.  Skin: Negative for rash.  Neurological: Positive for dizziness. Negative for weakness, light-headedness, numbness and headaches.  Psychiatric/Behavioral: Negative for agitation and behavioral problems.  All other systems reviewed and are negative.   Per HPI unless specifically indicated above   Allergies as of 10/04/2018   No Known Allergies     Medication List       Accurate as of Oct 04, 2018  3:11 PM. If you have any questions, ask your nurse or doctor.        albuterol 108 (90 Base) MCG/ACT inhaler Commonly known as:  VENTOLIN HFA Inhale 2 puffs into the lungs every 6 (six) hours as needed for wheezing or shortness of breath.   aspirin 81 MG EC tablet Take 1 tablet (81 mg total) by mouth daily for 30 days.   buPROPion 150 MG 12 hr tablet Commonly known as:  WELLBUTRIN SR Take 1 tablet (150 mg total) by mouth 2 (two) times daily. (Needs to be seen before next refill)   chlorthalidone 25 MG tablet Commonly known as:  HYGROTON Take 1 tablet (25 mg total) by mouth daily. (Needs to be seen before next refill)   clopidogrel 75 MG tablet Commonly known as:  PLAVIX Take 1 tablet (75 mg total) by mouth daily for 30 days.   clotrimazole 10 MG troche Commonly known as:  MYCELEX DISSOLVE 1 TABLET IN MOUTH 5 TIMES DAILY   gabapentin 100 MG capsule Commonly known as:  Neurontin Take 1 capsule (100 mg total) by mouth 3 (three) times daily. One three times daily   ipratropium 0.03 % nasal spray Commonly known as:  ATROVENT Place 1 spray into both nostrils 2 (two) times daily.   meclizine 25 MG tablet Commonly known as:  ANTIVERT Take 1 tablet by mouth 3 (three) times daily as needed.   meloxicam 7.5 MG tablet Commonly known as:  MOBIC Take 1 tablet by mouth daily as needed.   metoprolol succinate 25 MG 24 hr tablet Commonly known as:  TOPROL-XL Take 1 tablet (25 mg total) by mouth daily. Started by:  Worthy Rancher, MD   omeprazole 20 MG capsule  Commonly known as:  PRILOSEC Take 1 capsule (20 mg total) by mouth daily. (Needs to be seen before next refill)   pantoprazole 40 MG tablet Commonly known as:  Protonix Take 1 tablet (40 mg total) by mouth daily. Take 30-60 min before first meal of the day   rosuvastatin 20 MG tablet Commonly known as:  CRESTOR Take 1 tablet (20 mg total) by mouth daily at 6 PM for 30 days.   scopolamine 1 MG/3DAYS Commonly known as:  Transderm-Scop (1.5 MG) Place 1 patch (1.5 mg total) onto the skin every 3 (three) days.   traZODone 150 MG tablet Commonly known as:  DESYREL Take 1 tablet (150 mg total) by mouth at bedtime. (Needs to be seen before next refill)   triamcinolone cream 0.1 % Commonly known as:  KENALOG Apply 1 application topically 2 (two) times daily. Started by:  Worthy Rancher, MD            Durable Medical Equipment  (From admission, onward)         Start     Ordered   10/04/18 0000  DME Other see comment    Comments:  4 wheeled walker with a seat, diagnosis dizziness, at moderate risk for fall, status post CVA   10/04/18 1508           Objective:   BP (!) 157/71   Pulse 78   Temp 97.9 F (36.6 C) (Oral)   Ht 5' 5.5" (1.664 m)   Wt 136 lb 9.6 oz (62 kg)   BMI 22.39 kg/m   Wt Readings from Last 3 Encounters:  10/04/18 136 lb 9.6 oz (62 kg)  10/02/18 134 lb (60.8 kg)  09/22/18 141 lb 5 oz (64.1 kg)    Physical Exam Vitals signs and nursing note reviewed.  Constitutional:      General: She is not in acute distress.    Appearance: She is well-developed. She is not diaphoretic.  Eyes:     Conjunctiva/sclera: Conjunctivae normal.  Cardiovascular:     Rate and Rhythm: Normal rate and regular rhythm.     Heart sounds: Normal heart sounds. No murmur.  Pulmonary:     Effort: Pulmonary effort is normal. No respiratory distress.     Breath sounds: Normal breath sounds. No wheezing.  Musculoskeletal: Normal range of motion.        General: No  tenderness.  Skin:    General: Skin is warm and dry.     Findings: No rash.  Neurological:     Mental Status: She is alert and oriented to person, place, and time.     Cranial Nerves: No cranial nerve deficit or facial asymmetry.     Sensory: Sensation is intact. No sensory deficit.     Motor: Motor function is intact. No weakness.  Gait: Gait abnormal (Unsteady and widened gait).  Psychiatric:        Behavior: Behavior normal.       Assessment & Plan:   Problem List Items Addressed This Visit      Cardiovascular and Mediastinum   Hypertension   Relevant Medications   metoprolol succinate (TOPROL-XL) 25 MG 24 hr tablet     Other   Status post CVA - Primary   Relevant Medications   metoprolol succinate (TOPROL-XL) 25 MG 24 hr tablet   Other Relevant Orders   CBC with Differential/Platelet   CMP14+EGFR   DME Other see comment    Other Visit Diagnoses    Hypokalemia       Relevant Medications   metoprolol succinate (TOPROL-XL) 25 MG 24 hr tablet   Other Relevant Orders   CBC with Differential/Platelet   CMP14+EGFR   Acute kidney injury ([place])       Relevant Orders   CBC with Differential/Platelet   CMP14+EGFR   Dizziness       Relevant Orders   DME Other see comment   At moderate risk for fall       Relevant Orders   DME Other see comment   Visual changes          Recommended for patient to do memory and mental challenges to help improve that neurological deficits and recommended for her to go see her eye doctor.  BP elevated slightly, added metoprolol to her current regiment of hydrochlorothiazide recheck blood work today.  Follow up plan: Return in about 2 months (around 12/04/2018), or if symptoms worsen or fail to improve, for Hypertension and dizziness follow-up.  Counseling provided for all of the vaccine components Orders Placed This Encounter  Procedures  . DME Other see comment  . CBC with Differential/Platelet  . [place], MD [facility] 10/04/2018, 3:11 PM

## 2018-10-05 ENCOUNTER — Ambulatory Visit: Payer: Medicare Other | Admitting: *Deleted

## 2018-10-05 ENCOUNTER — Other Ambulatory Visit: Payer: Self-pay

## 2018-10-05 ENCOUNTER — Ambulatory Visit (INDEPENDENT_AMBULATORY_CARE_PROVIDER_SITE_OTHER): Payer: Medicare Other | Admitting: Licensed Clinical Social Worker

## 2018-10-05 DIAGNOSIS — I10 Essential (primary) hypertension: Secondary | ICD-10-CM

## 2018-10-05 DIAGNOSIS — R42 Dizziness and giddiness: Secondary | ICD-10-CM

## 2018-10-05 DIAGNOSIS — M19042 Primary osteoarthritis, left hand: Secondary | ICD-10-CM

## 2018-10-05 DIAGNOSIS — R0602 Shortness of breath: Secondary | ICD-10-CM

## 2018-10-05 DIAGNOSIS — M1990 Unspecified osteoarthritis, unspecified site: Secondary | ICD-10-CM | POA: Diagnosis not present

## 2018-10-05 DIAGNOSIS — M19041 Primary osteoarthritis, right hand: Secondary | ICD-10-CM

## 2018-10-05 DIAGNOSIS — N183 Chronic kidney disease, stage 3 (moderate): Secondary | ICD-10-CM | POA: Diagnosis not present

## 2018-10-05 DIAGNOSIS — I6621 Occlusion and stenosis of right posterior cerebral artery: Secondary | ICD-10-CM | POA: Diagnosis not present

## 2018-10-05 DIAGNOSIS — I63531 Cerebral infarction due to unspecified occlusion or stenosis of right posterior cerebral artery: Secondary | ICD-10-CM | POA: Diagnosis not present

## 2018-10-05 DIAGNOSIS — R483 Visual agnosia: Secondary | ICD-10-CM

## 2018-10-05 DIAGNOSIS — Z87891 Personal history of nicotine dependence: Secondary | ICD-10-CM | POA: Diagnosis not present

## 2018-10-05 DIAGNOSIS — Z7982 Long term (current) use of aspirin: Secondary | ICD-10-CM | POA: Diagnosis not present

## 2018-10-05 DIAGNOSIS — I129 Hypertensive chronic kidney disease with stage 1 through stage 4 chronic kidney disease, or unspecified chronic kidney disease: Secondary | ICD-10-CM | POA: Diagnosis not present

## 2018-10-05 DIAGNOSIS — H9190 Unspecified hearing loss, unspecified ear: Secondary | ICD-10-CM | POA: Diagnosis not present

## 2018-10-05 DIAGNOSIS — Z791 Long term (current) use of non-steroidal anti-inflammatories (NSAID): Secondary | ICD-10-CM | POA: Diagnosis not present

## 2018-10-05 DIAGNOSIS — E785 Hyperlipidemia, unspecified: Secondary | ICD-10-CM

## 2018-10-05 DIAGNOSIS — Z7902 Long term (current) use of antithrombotics/antiplatelets: Secondary | ICD-10-CM | POA: Diagnosis not present

## 2018-10-05 DIAGNOSIS — Z7951 Long term (current) use of inhaled steroids: Secondary | ICD-10-CM | POA: Diagnosis not present

## 2018-10-05 NOTE — Chronic Care Management (AMB) (Signed)
  Chronic Care Management   Follow Up Note   10/05/2018 Name: Pamela Hughes MRN: [id number] DOB: [birth year]  Referred by: Dettinger, Elige Radon, MD Reason for referral : Chronic Care Management (RNCM follow up)   Pamela Hughes is a 78 y.o. year old female who is a primary care patient of Dettinger, Elige Radon, MD. The CCM team was consulted for assistance with chronic disease management and care coordination needs.    I made an unsuccessful attempt to follow up with [name] by telephone today.   Follow Up Plan A HIPPA compliant phone message was left for the patient providing contact information and requesting a return call.  The CM team will reach out to the patient again over the next 7 days.     [name], RN-BC, BSN Nurse Care Manager [facility] [phone]

## 2018-10-05 NOTE — Patient Instructions (Addendum)
Licensed Clinical Social Worker Visit Information  Goals we discussed today:  Goals Addressed            This Visit's Progress   . "I need help with getting food delivered to my home." (pt-stated)       Current Barriers:  Marland Kitchen Knowledge Deficits related to resources available to deliver food to her home.  . Nurse Case Manager Clinical Goal(s): Over the next 7 days, patient will work with LCSW to address needs related to food delivery options.   Interventions:  . LCSW provided education to patient re: ADTS and Meals On Wheels (waiting list) . Encouraged client to call ADTS to add her name to waiting list for Meals on Wheels.  . Discussed current food arrangements. She is signed up with the [employer] in [place] and they deliver a food box. Her daughter and friend also bring food items to client as needed.  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    Client said she has a friend, [name], who occasionally helps client with meals   Plan: Client to attend scheduled client medical appointments LCSW to call client in next 3 weeks to talk with client about food needs of client and about local food resources Client to participate in home health physical therapy sessions as scheduled Client to communicate with RN CM [name] to discuss nursing needs of client.  Initial goal documentation        Materials Provided: No  Follow Up Plan: LCSW to call client in next 3 weeks to talk with client about food needs of client and about local food resources   The patient verbalized understanding of instructions provided today and declined a print copy of patient instruction materials.   [name].Rayburn Mundis MSW, LCSW Licensed Clinical Social Worker [facility] Care Management [phone]

## 2018-10-05 NOTE — Chronic Care Management (AMB) (Signed)
Care Management Note   Pamela Hughes is a 78 y.o. year old female who is a primary care patient of Dettinger, Elige Radon, MD. The CM team was consulted for assistance with chronic disease management and care coordination.   I reached out to [name] by phone today.   Review of patient status, including review of consultants reports, relevant laboratory and other test results, and collaboration with appropriate care team members and the patient's provider was performed as part of comprehensive patient evaluation and provision of chronic care management services.   Social Determinants of Health:Risk for Stress; at risk for food insecurity.    Virtual BH Phone Follow Up from 08/02/2017 in [facility]  PHQ-9 Total Score  10      Goals Addressed            This Visit's Progress   . "I need help with getting food delivered to my home." (pt-stated)       Current Barriers:  Marland Kitchen Knowledge Deficits related to resources available to deliver food to her home.  . Nurse Case Manager Clinical Goal(s): Over the next 7 days, patient will work with LCSW to address needs related to food delivery options.   Interventions:  . LCSW provided education to patient re: ADTS and Meals On Wheels (waiting list) . Encouraged client to call ADTS to add her name to waiting list for Meals on Wheels.  . Discussed current food arrangements. She is signed up with the [employer] in [place] and they deliver a food box. Her daughter and friend also bring food items to her as needed.  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    Client said she has a friend, [name], who occasionally helps client with meals   Plan: Client to attend scheduled medical appointments LCSW to call client in next 3 weeks to talk with client about food needs of client  and about local food resources Client to participate in home health physical therapy sessions as scheduled Client to communicate with RN CM [name] to discuss nursing needs of clinet.  Initial goal documentation      Client said she wears glasses. She spoke of vision challenges in both eyes.  She has had history of cataract surgery on both eyes. She said she feels frightened when going up and down stairs due to her vision limitations. She spoke of dizziness. She said her mother had a stroke and went blind in the past. She said she is no longer driving. She said she has a friend who stays with her at her home to assist client. She said she has a decreased appetite. LCSW provided client with phone number for Meals on Wheels program in [place]; LCSW encouraged client to call Meals on Wheels program to add client name to waiting list for Meals on Wheels. Client said she receives food one time a month from [employer] agency.  Client said she feels dizzy and nauseated from time to time. She said she obtained a new pair of glasses last Fall.  Follow Up Plan: LCSW to call client in next 3 weeks to talk with client about food needs of client and about food resources for client in the area  [name].Breshae Belcher MSW, LCSW Licensed Clinical Social Worker [facility] Care  Management [phone]

## 2018-10-06 ENCOUNTER — Ambulatory Visit (INDEPENDENT_AMBULATORY_CARE_PROVIDER_SITE_OTHER): Payer: Medicare Other

## 2018-10-06 ENCOUNTER — Other Ambulatory Visit: Payer: Self-pay

## 2018-10-06 DIAGNOSIS — I129 Hypertensive chronic kidney disease with stage 1 through stage 4 chronic kidney disease, or unspecified chronic kidney disease: Secondary | ICD-10-CM

## 2018-10-06 DIAGNOSIS — N183 Chronic kidney disease, stage 3 (moderate): Secondary | ICD-10-CM

## 2018-10-06 DIAGNOSIS — Z7902 Long term (current) use of antithrombotics/antiplatelets: Secondary | ICD-10-CM

## 2018-10-06 DIAGNOSIS — M1990 Unspecified osteoarthritis, unspecified site: Secondary | ICD-10-CM

## 2018-10-06 DIAGNOSIS — I6621 Occlusion and stenosis of right posterior cerebral artery: Secondary | ICD-10-CM

## 2018-10-06 DIAGNOSIS — Z87891 Personal history of nicotine dependence: Secondary | ICD-10-CM

## 2018-10-06 DIAGNOSIS — Z7982 Long term (current) use of aspirin: Secondary | ICD-10-CM

## 2018-10-06 DIAGNOSIS — F329 Major depressive disorder, single episode, unspecified: Secondary | ICD-10-CM

## 2018-10-06 DIAGNOSIS — Z791 Long term (current) use of non-steroidal anti-inflammatories (NSAID): Secondary | ICD-10-CM

## 2018-10-06 DIAGNOSIS — R483 Visual agnosia: Secondary | ICD-10-CM

## 2018-10-06 DIAGNOSIS — Z7951 Long term (current) use of inhaled steroids: Secondary | ICD-10-CM

## 2018-10-06 DIAGNOSIS — E785 Hyperlipidemia, unspecified: Secondary | ICD-10-CM

## 2018-10-06 DIAGNOSIS — F419 Anxiety disorder, unspecified: Secondary | ICD-10-CM

## 2018-10-06 DIAGNOSIS — H9190 Unspecified hearing loss, unspecified ear: Secondary | ICD-10-CM

## 2018-10-11 DIAGNOSIS — Z7951 Long term (current) use of inhaled steroids: Secondary | ICD-10-CM | POA: Diagnosis not present

## 2018-10-11 DIAGNOSIS — E785 Hyperlipidemia, unspecified: Secondary | ICD-10-CM | POA: Diagnosis not present

## 2018-10-11 DIAGNOSIS — M1990 Unspecified osteoarthritis, unspecified site: Secondary | ICD-10-CM | POA: Diagnosis not present

## 2018-10-11 DIAGNOSIS — Z7982 Long term (current) use of aspirin: Secondary | ICD-10-CM | POA: Diagnosis not present

## 2018-10-11 DIAGNOSIS — I129 Hypertensive chronic kidney disease with stage 1 through stage 4 chronic kidney disease, or unspecified chronic kidney disease: Secondary | ICD-10-CM | POA: Diagnosis not present

## 2018-10-11 DIAGNOSIS — R483 Visual agnosia: Secondary | ICD-10-CM | POA: Diagnosis not present

## 2018-10-11 DIAGNOSIS — I6621 Occlusion and stenosis of right posterior cerebral artery: Secondary | ICD-10-CM | POA: Diagnosis not present

## 2018-10-11 DIAGNOSIS — Z7902 Long term (current) use of antithrombotics/antiplatelets: Secondary | ICD-10-CM | POA: Diagnosis not present

## 2018-10-11 DIAGNOSIS — H9190 Unspecified hearing loss, unspecified ear: Secondary | ICD-10-CM | POA: Diagnosis not present

## 2018-10-11 DIAGNOSIS — Z87891 Personal history of nicotine dependence: Secondary | ICD-10-CM | POA: Diagnosis not present

## 2018-10-11 DIAGNOSIS — Z791 Long term (current) use of non-steroidal anti-inflammatories (NSAID): Secondary | ICD-10-CM | POA: Diagnosis not present

## 2018-10-11 DIAGNOSIS — N183 Chronic kidney disease, stage 3 (moderate): Secondary | ICD-10-CM | POA: Diagnosis not present

## 2018-10-18 ENCOUNTER — Ambulatory Visit: Payer: Self-pay | Admitting: Licensed Clinical Social Worker

## 2018-10-18 DIAGNOSIS — R0602 Shortness of breath: Secondary | ICD-10-CM

## 2018-10-18 DIAGNOSIS — I63531 Cerebral infarction due to unspecified occlusion or stenosis of right posterior cerebral artery: Secondary | ICD-10-CM

## 2018-10-18 DIAGNOSIS — R42 Dizziness and giddiness: Secondary | ICD-10-CM

## 2018-10-18 DIAGNOSIS — E785 Hyperlipidemia, unspecified: Secondary | ICD-10-CM

## 2018-10-18 DIAGNOSIS — R483 Visual agnosia: Secondary | ICD-10-CM

## 2018-10-18 DIAGNOSIS — M19041 Primary osteoarthritis, right hand: Secondary | ICD-10-CM

## 2018-10-18 DIAGNOSIS — F331 Major depressive disorder, recurrent, moderate: Secondary | ICD-10-CM

## 2018-10-18 DIAGNOSIS — I10 Essential (primary) hypertension: Secondary | ICD-10-CM

## 2018-10-18 NOTE — Patient Instructions (Signed)
Licensed Clinical Social Worker Visit Information  Goals we discussed today:  Goals Addressed            This Visit's Progress   . "I need help with getting food delivered to my home." (pt-stated)       Current Barriers:  Marland Kitchen Knowledge Deficits related to resources available to deliver food to her home.  . Nurse Case Manager Clinical Goal(s): Over the next 7 days, patient will work with LCSW to address needs related to food delivery options.   Interventions:  . LCSW provided education to patient re: ADTS and Meals On Wheels (waiting list) . Encouraged client to call ADTS to add her name to waiting list for Meals on Wheels.  . Discussed current food arrangements. She is signed up with the [employer] in [place] and they deliver a food box. Her daughter and friend also brings things to her as needed.  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    Client said she has a friend, [name], who occasionally helps client with meals   Plan: LCSW to call client in next 3 weeks to talk with client about food needs of client and about local food resources Client to participate in home health physical therapy sessions as scheduled Client to communicate with RN CM [name] to discuss nursing needs of clinet.  Initial goal documentation      Materials Provided: No  Follow Up Plan: LCSW to call client in next 3 weeks to talk with client about food needs of client and about local food resources  The patient verbalized understanding of instructions provided today and declined a print copy of patient instruction materials.   [name].Blaklee [place]ores MSW, LCSW Licensed Clinical Social Worker [facility] [phone]

## 2018-10-18 NOTE — Chronic Care Management (AMB) (Signed)
  Care Management Note   Pamela Hughes is a 78 y.o. year old female who is a primary care patient of Dettinger, Elige Radon, MD. The CM team was consulted for assistance with chronic disease management and care coordination.   I reached out to [name] by phone today.   Review of patient status, including review of consultants reports, relevant laboratory and other test results, and collaboration with appropriate care team members and the patient's provider was performed as part of comprehensive patient evaluation and provision of chronic care management services.   Social Determinants of Health:Risk for Stress    Virtual BH Phone Follow Up from 08/02/2017 in [facility]  PHQ-9 Total Score  10     Goals Addressed            This Visit's Progress   . "I need help with getting food delivered to my home." (pt-stated)       Current Barriers:  Marland Kitchen Knowledge Deficits related to resources available to deliver food to her home.  . Nurse Case Manager Clinical Goal(s): Over the next 7 days, patient will work with LCSW to address needs related to food delivery options.   Interventions:  . LCSW provided education to patient re: ADTS and Meals On Wheels (waiting list) . Encouraged client to call ADTS to add her name to waiting list for Meals on Wheels.  . Discussed current food arrangements. She is signed up with the [employer] in [place] and they deliver a food box. Her daughter and friend also brings things to her as needed.  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    Client said she has a friend, [name], who occasionally helps client with meals   Plan: LCSW to call client in next 3 weeks to talk with client about food needs of client and about local food resources Client to participate in home health physical therapy  sessions as scheduled Client to communicate with RN CM [name] to discuss nursing needs of clinet.  Initial goal documentation     Client said she has visual challenges.Client spoke of her stroke which occurred about 2 months ago.  She said she gets short of breath upon walking a short distance.  Client said she has a friend who is helping her in the home at this time as needed.  Client said she has some memory challenges. Client said she has adequate food supply. LCSW spoke with client about Meals on Wheels waiting list. She said that he appetite is adequate.  She said she is sleeping well. She said that her friend [name], helps with transport needs of client. Client said she has difficulty focusing with her eyes (putting on makeup, for example)  Follow Up Plan: LCSW to call client in next 3 weeks to talk with client about food needs of client and about local food resources  [name].Elder Davidian MSW, LCSW Licensed Clinical Social Worker [facility] Care [facility] [phone]

## 2018-10-20 ENCOUNTER — Other Ambulatory Visit: Payer: Self-pay | Admitting: Internal Medicine

## 2018-10-20 ENCOUNTER — Other Ambulatory Visit: Payer: Self-pay | Admitting: Family Medicine

## 2018-10-20 NOTE — Telephone Encounter (Signed)
Please advise 

## 2018-11-08 ENCOUNTER — Ambulatory Visit (INDEPENDENT_AMBULATORY_CARE_PROVIDER_SITE_OTHER): Payer: Medicare Other | Admitting: Licensed Clinical Social Worker

## 2018-11-08 DIAGNOSIS — I63531 Cerebral infarction due to unspecified occlusion or stenosis of right posterior cerebral artery: Secondary | ICD-10-CM | POA: Diagnosis not present

## 2018-11-08 DIAGNOSIS — R483 Visual agnosia: Secondary | ICD-10-CM

## 2018-11-08 DIAGNOSIS — M19041 Primary osteoarthritis, right hand: Secondary | ICD-10-CM

## 2018-11-08 DIAGNOSIS — E785 Hyperlipidemia, unspecified: Secondary | ICD-10-CM | POA: Diagnosis not present

## 2018-11-08 DIAGNOSIS — F331 Major depressive disorder, recurrent, moderate: Secondary | ICD-10-CM | POA: Diagnosis not present

## 2018-11-08 DIAGNOSIS — R42 Dizziness and giddiness: Secondary | ICD-10-CM

## 2018-11-08 DIAGNOSIS — I10 Essential (primary) hypertension: Secondary | ICD-10-CM

## 2018-11-08 DIAGNOSIS — M19042 Primary osteoarthritis, left hand: Secondary | ICD-10-CM

## 2018-11-08 DIAGNOSIS — R0602 Shortness of breath: Secondary | ICD-10-CM

## 2018-11-08 NOTE — Chronic Care Management (AMB) (Signed)
Care Management Note   Pamela CommentDoris Hughes Bergevin is a 78 y.o. year old female who is a primary care patient of Dettinger, Elige RadonJoshua A, MD. The CM team was consulted for assistance with chronic disease management and care coordination.   I reached out to Pamela Hughes Peak by phone today.   Review of patient status, including review of consultants reports, relevant laboratory and other test results, and collaboration with appropriate care team members and the patient's provider was performed as part of comprehensive patient evaluation and provision of chronic care management services.   Social Determinants of Health: Risk for stress; risk for tobacco exposure; risk for social isolation    [facility]  PHQ-9 Total Score  10     Goals Addressed            This Visit's Progress   . "I need help with getting food delivered to my home." (pt-stated)       Current Barriers:  Marland Kitchen. Knowledge Deficits related to resources available to deliver food to her home.  . Nurse Case Manager Clinical Goal(s): Over the next 7 days, patient will work with LCSW to address needs related to food delivery options.   Interventions:  . LCSW previously provided education to patient re: ADTS and Meals On Wheels (waiting list) . Previously encouraged client to call ADTS to add her name to waiting list for Meals on Wheels.  . Discussed current food arrangements. She is signed up with the [employer] in [place] and they deliver a food box. Her daughter and friend also bring things to her as needed.  Marland Kitchen. LCSW spoke with client about client food needs  Patient Self Care Activities:  Currently UNABLE TO independently drive due to dizziness s/Hughes CVA but she is able to warm up meals and feed herself.    Client said she has a friend, Steward DroneBrenda, who occasionally helps client with meals   Plan: LCSW to call client in next 3 weeks to talk with client about food needs of client and about  local food resources Client to communicate with RN CM Demetrios LollKristen Hudy to discuss nursing needs of client. Client to attend scheduled client medical appointments  Initial goal documentation      Client said she wears glasses. She spoke of vision challenges in both eyes.  She has had history of cataract surgery on both eyes. She said she feels frightened when going up and down stairs due to her vision limitations. She spoke of dizziness. She said her mother had a stroke and went blind in the past. She said she is no longer driving. She said she has a friend who stays with her at her home to assist client.  LCSW previously provided client with phone number for Meals on Wheels program in [place]; LCSW had encouraged client to call Meals on Wheels program to add client name to waiting list for Meals on Wheels. Client said she added her name to Meals [employer] Wheels waiting list. LCSW reminded client that sometimes it takes a few months for applicants for Meals on Wheels to come to the top of recipient list. Client said she receives food one time a month from [employer] agency.  Client said she feels dizzy and nauseated from time to time. She said she obtained a new pair of glasses last Fall. Client spoke of her stroke which occurred about 2 months ago.  She said she gets short of breath  upon walking a short distance.  Client said she has a friend who is helping her in the home at this time as needed.  Client said she has some memory challenges. Client said she has adequate food supply. She said she is sleeping well. She said that her friend [name], helps with transport needs of client. Client said she has difficulty focusing with her eyes (putting on makeup, for example) Client said she is walking well but may get a little dizzy when walking. Client said she may need to look into getting a new pair of glasses. LCSW reminded client to call RNCM as needed to talk about nursing needs of client. Client has not had  any recent falls. LCSW gave client LCSW phone number for client to call LCSW to discuss social work needs of client  Follow Up Plan: LCSW to call client in next 3 weeks to talk with client about food needs of client and about local food resources  [name].Cauy Melody MSW, LCSW Licensed Clinical Social Worker [facility] Care [facility] [phone]

## 2018-11-08 NOTE — Patient Instructions (Signed)
Licensed Clinical Social Worker Visit Information  Goals we discussed today:  Goals Addressed            This Visit's Progress   . "I need help with getting food delivered to my home." (pt-stated)       Current Barriers:  Marland Kitchen Knowledge Deficits related to resources available to deliver food to her home.  . Nurse Case Manager Clinical Goal(s): Over the next 7 days, patient will work with LCSW to address needs related to food delivery options.   Interventions:  . LCSW previously provided education to patient re: ADTS and Meals On Wheels (waiting list) . Previously encouraged client to call ADTS to add her name to waiting list for Meals on Wheels.  . Discussed current food arrangements. She is signed up with [employer] in [place] and they deliver a food box. Her daughter and friend also brings things to her as needed.  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    Client said she has a friend, [name], who occasionally helps client with meals   Plan: LCSW to call client in next 3 weeks to talk with client about food needs of client and about local food resources Client to communicate with RN CM [name] to discuss nursing needs of client. Client to attend scheduled client medical appointments  Initial goal documentation     Materials Provided: No  Follow Up Plan: LCSW to call client in next 3 weeks to talk with client about food needs of client and about local food resources  The patient verbalized understanding of instructions provided today and declined a print copy of patient instruction materials.   [name].Marykay Mccleod MSW, LCSW Licensed Clinical Social Worker [facility] [phone]

## 2018-11-17 ENCOUNTER — Other Ambulatory Visit: Payer: Self-pay | Admitting: Family Medicine

## 2018-11-17 ENCOUNTER — Other Ambulatory Visit: Payer: Self-pay | Admitting: Internal Medicine

## 2018-11-28 ENCOUNTER — Ambulatory Visit (INDEPENDENT_AMBULATORY_CARE_PROVIDER_SITE_OTHER): Payer: Medicare Other | Admitting: Internal Medicine

## 2018-11-28 ENCOUNTER — Encounter: Payer: Self-pay | Admitting: Internal Medicine

## 2018-11-28 ENCOUNTER — Ambulatory Visit: Payer: Medicare Other | Admitting: Internal Medicine

## 2018-11-28 ENCOUNTER — Other Ambulatory Visit: Payer: Self-pay

## 2018-11-28 DIAGNOSIS — R05 Cough: Secondary | ICD-10-CM

## 2018-11-28 DIAGNOSIS — R9389 Abnormal findings on diagnostic imaging of other specified body structures: Secondary | ICD-10-CM

## 2018-11-28 DIAGNOSIS — R0609 Other forms of dyspnea: Secondary | ICD-10-CM | POA: Diagnosis not present

## 2018-11-28 DIAGNOSIS — R053 Chronic cough: Secondary | ICD-10-CM

## 2018-11-28 NOTE — Patient Instructions (Addendum)
No change in medications   See your neurologist to evaluate for driving       Phillips Odor, MD Follow up in 6 week(s).   Specialty:  Neurology Contact information: [address] [place] [address] [phone]    I will advise Dr Dettinger re whether any further pulmonary follow up is needed after a  Full review of your records and CT chest that we have scheduled in July (we will call to set this up)  No regular follow up here is needed

## 2018-11-28 NOTE — Progress Notes (Signed)
Pamela Hughes, female    DOB: 02/10/1941,    MRN: [id number]   Brief patient profile:  28 yowf quit smoking 1976 due to cost but remembers episodes severe cough dx as bronchitis  "with colds' as child but months between illnesses less likely in summer with no need maint rx until 2015/2016 when developed persistent cough with sob worse over the same period of time so referred to pulmonary clinic 05/18/2018 by Dr   Lemar Lofty   Allergy testing around 2000 positive but not better with shots x one year    History of Present Illness  05/18/2018  Pulmonary/ 1st office eval/Jami Bogdanski  Chief Complaint  Patient presents with  . Pulmonary Consult    Referred by Dr Lajuana Ripple.  Pt c/o cough "for years and years". She states she occ produces some clear to white sputum.  She is SOB with exertion such as walking accross a parking lot.   Dyspnea:  50 -100 ft  Cough: so hard vomits/otherwise min white mucus  Sleep: better when sleeping and p 5 min of stirring in am  SABA use: not much help Takes prilosec pc and lots of cough drops/ saba doesn't help Does not recall if every took pred x > 5 days  Chest discomfort on R anteriorly just with coughing fits    Kouffman Reflux v Neurogenic Cough Differentiator Reflux Comments  Do you awaken from a sound sleep coughing violently?                            With trouble breathing? Not often   Do you have choking episodes when you cannot  Get enough air, gasping for air ?              Yes    Do you usually cough when you lie down into  The bed, or when you just lie down to rest ?                          Yes   Do you usually cough after meals or eating?         no   Do you cough when (or after) you bend over?    Yes   GERD SCORE     Kouffman Reflux v Neurogenic Cough Differentiator Neurogenic   Do you more-or-less cough all day long? Sporadic    Does change of temperature make you cough? no   Does laughing or chuckling cause you to cough? maybe   Do fumes  (perfume, automobile fumes, burned  Toast, etc.,) cause you to cough ?      Yes    Does speaking, singing, or talking on the phone cause you to cough   ?               No    Neurogenic/Airway score      rec Start protonix 40 mg Take 30-60 min before first meal of the day and take the prilosec 20 mg 30 min  before supper  GERD diet   Prednisone 10 mg x 6 days First take hycodan up to 1 tsp every  4 hours to suppress even the urge to cough at all or even clear your throat. Swallowing water or using ice chips/non mint and menthol containing candies (such as lifesavers or sugarless jolly ranchers) are also effective.  You should rest your voice and avoid activities that you  know make you cough. Please schedule a follow up office visit in 2  weeks, sooner if needed  with all medications /inhalers/ solutions in hand so we can verify exactly what you are taking. This includes all medications from all doctors and over the counters   06/02/2018  f/u ov/Reinette Cuneo re: sob/cough/ brought some but not all meds and confused with details fo care  eg maint vs prns, generic vs trade names   Chief Complaint  Patient presents with  . Follow-up    cough is some better. Her breathing is unchanged. She rarely uses her albuterol inhaler.   Dyspnea:  At rest and walking are the same  Cough: p stirs x 30 - 40 min / takes hycodan each am instead of prn  as directed - if doesn't take the hycodan continues to cough same pattern Sleeping: fine at hs  SABA use: not helpful  02: none Watery nasal discharge rec Start gabapentin 100mg  three times a day - you gradually build this up and the cough should improved Pantoprazole is Take 30-60 min before first meal of the day and omeprazole 20 mg is Take 30- 60 min before  last meals of the day  Take  Hycodan up to a tsp every 4 hours to suppress the urge to cough. Swallowing water and/or using ice chips/non mint and menthol containing candies (such as lifesavers or sugarless jolly  ranchers) are also effective.  You should rest your voice and avoid activities that you know make you cough. Once you have eliminated the cough for 3 straight days try off the hycodan  Please remember to go to the lab and x-ray department    for your tests - we will call you with the results when they are available. Please schedule a follow up office visit in 4 weeks, sooner if needed  with all medications /inhalers/ solutions in hand so we can verify exactly what you are taking. This includes all medications from all doctors and over the counters      06/30/2018  f/u ov/Luv Mish re:   uacs improved on gabapentin, no longer on hycodan- did  Not bring meds " I know them all and there are no changes"  (note this is not the case at all, wanted refills on specific meds but doesn't know which ones_) Chief Complaint  Patient presents with  . Follow-up    Cough had started to improve but then worsened a wk ago- prod with clear to yellow sputum.  She has minimal SOB. She has not been using her albuterol inhaler.   Dyspnea:  Not limited by breathing from desired activities  / stationary bike  Cough: better until caught "typical heal cold " one week prior to OV  > improving  Sleeping: block of wood under bed "a couple of inches  SABA use: none 02:  None  rec No change in medications  Please schedule a follow up office visit in 2 months  sooner if needed  with all medications /inhalers/ solutions in hand so we can verify exactly what you are taking. This includes all medications from all doctors and over the counters - remind her re f/u ct due 12/19/18    11/28/2018  f/u ov/Gayna Braddy re:  cough ? ILD  S/p cva/ more confused than ever re details of care/ plan for f/u  Chief Complaint  Patient presents with  . Follow-up    Cough is some better since the last visit.   Dyspnea:  Not limited  by breathing from desired activities  / no bike "my doctor told me not to" Cough: much better  Sleeping: fine with hob up   SABA use: no 02: no     No obvious day to day or daytime variability or assoc excess/ purulent sputum or mucus plugs or hemoptysis or cp or chest tightness, subjective wheeze or overt sinus or hb symptoms.   Sleeping ok  without nocturnal  or early am exacerbation  of respiratory  c/o's or need for noct saba. Also denies any obvious fluctuation of symptoms with weather or environmental changes or other aggravating or alleviating factors except as outlined above   No unusual exposure hx or h/o childhood pna/ asthma or knowledge of premature birth.  Current Allergies, Complete Past Medical History, Past Surgical History, Family History, and Social History were reviewed in [facility] electronic medical record.  ROS  The following are not active complaints unless bolded Hoarseness, sore throat, dysphagia, dental problems, itching, sneezing,  nasal congestion or discharge of excess mucus or purulent secretions, ear ache,   fever, chills, sweats, unintended wt loss or wt gain, classically pleuritic or exertional cp,  orthopnea pnd or arm/hand swelling  or leg swelling, presyncope, palpitations, abdominal pain, anorexia, nausea, vomiting, diarrhea  or change in bowel habits or change in bladder habits, change in stools or change in urine, dysuria, hematuria,  rash, arthralgias, visual complaints, headache, numbness, weakness or ataxia or problems with walking or coordination,  change in mood or  memory.        Current Meds - - NOTE:   Unable to verify as accurately reflecting what pt takes     Medication Sig  . albuterol (PROVENTIL HFA;VENTOLIN HFA) 108 (90 Base) MCG/ACT inhaler Inhale 2 puffs into the lungs every 6 (six) hours as needed for wheezing or shortness of breath.  Marland Kitchen. buPROPion (WELLBUTRIN SR) 150 MG 12 hr tablet Take 1 tablet (150 mg total) by mouth 2 (two) times daily. (Needs to be seen before next refill)  . chlorthalidone (HYGROTON) 25 MG tablet TAKE ONE (1) TABLET EACH DAY  .  clotrimazole (MYCELEX) 10 MG troche DISSOLVE 1 TABLET IN MOUTH 5 TIMES DAILY  . gabapentin (NEURONTIN) 100 MG capsule Take 1 capsule (100 mg total) by mouth 3 (three) times daily. One three times daily  . ipratropium (ATROVENT) 0.03 % nasal spray USE TWICE DAILY AS DIRECTED  . meclizine (ANTIVERT) 25 MG tablet TAKE ONE TABLET 3 TIMES A DAY AS NEEDED.  Marland Kitchen. meloxicam (MOBIC) 7.5 MG tablet Take 1 tablet by mouth daily as needed.   . metoprolol succinate (TOPROL-XL) 25 MG 24 hr tablet Take 1 tablet (25 mg total) by mouth daily.  Marland Kitchen. omeprazole (PRILOSEC) 20 MG capsule Take 1 capsule (20 mg total) by mouth daily. (Needs to be seen before next refill)  . pantoprazole (PROTONIX) 40 MG tablet TAKE 1 TABLET DAILY BEFORE BREAKFAST  . scopolamine (TRANSDERM-SCOP, 1.5 MG,) 1 MG/3DAYS Place 1 patch (1.5 mg total) onto the skin every 3 (three) days.  . traZODone (DESYREL) 150 MG tablet TAKE ONE TABLET DAILY AT BEDTIME  . triamcinolone cream (KENALOG) 0.1 % Apply 1 application topically 2 (two) times daily.                  Objective:     amb wf nad   11/28/2018        145  06/30/2018        140   06/02/18 139 lb (63 kg)  05/18/18 139 lb (63 kg)  03/31/18 142 lb (64.4 kg)     Vital signs reviewed - Note on arrival 02 sats  97% on RA      HEENT: Upper denture/ lower partial plate/ min swelling  turbinates bilaterally   HEENT:  Upper denture/ lower partial plate/ nl turbinates bilaterally, and oropharynx. Nl external ear canals without cough reflex   NECK :  without JVD/Nodes/TM/ nl carotid upstrokes bilaterally   LUNGS: no acc muscle use,  Nl contour chest which is clear to A and P bilaterally without cough on insp or exp maneuvers   CV:  RRR  no s3 or murmur or increase in P2, and no edema   ABD:  soft and nontender with nl inspiratory excursion in the supine position. No bruits or organomegaly appreciated, bowel sounds nl  MS:  Nl gait/ ext warm without deformities, calf tenderness,  cyanosis or clubbing No obvious joint restrictions   SKIN: warm and dry without lesions    NEURO:  alert, approp, nl sensorium with  no motor or cerebellar deficits apparent.         I personally reviewed images and agree with radiology impression as follows:  CXR:   09/21/18 No active cardiopulmonary disease.        Assessment

## 2018-11-29 ENCOUNTER — Encounter: Payer: Self-pay | Admitting: Internal Medicine

## 2018-11-29 ENCOUNTER — Ambulatory Visit (INDEPENDENT_AMBULATORY_CARE_PROVIDER_SITE_OTHER): Payer: Medicare Other | Admitting: Licensed Clinical Social Worker

## 2018-11-29 DIAGNOSIS — I63531 Cerebral infarction due to unspecified occlusion or stenosis of right posterior cerebral artery: Secondary | ICD-10-CM | POA: Diagnosis not present

## 2018-11-29 DIAGNOSIS — R0602 Shortness of breath: Secondary | ICD-10-CM

## 2018-11-29 DIAGNOSIS — I10 Essential (primary) hypertension: Secondary | ICD-10-CM

## 2018-11-29 DIAGNOSIS — M19041 Primary osteoarthritis, right hand: Secondary | ICD-10-CM | POA: Diagnosis not present

## 2018-11-29 DIAGNOSIS — F331 Major depressive disorder, recurrent, moderate: Secondary | ICD-10-CM

## 2018-11-29 DIAGNOSIS — E785 Hyperlipidemia, unspecified: Secondary | ICD-10-CM

## 2018-11-29 DIAGNOSIS — R42 Dizziness and giddiness: Secondary | ICD-10-CM

## 2018-11-29 DIAGNOSIS — M19042 Primary osteoarthritis, left hand: Secondary | ICD-10-CM

## 2018-11-29 DIAGNOSIS — R483 Visual agnosia: Secondary | ICD-10-CM

## 2018-11-29 NOTE — Assessment & Plan Note (Addendum)
Onset 2015 Spirometry 05/28/18  FEV1 1.7 (81%)  Ratio 0.75   05/18/2018   Walked RA  2 laps @ 228ft each @ fast pace  stopped due to  End of study, no desats, some cough > sob  06/20/18  HRCT chest neg ILD  -  11/28/2018   Walked RA  2 laps @ approx 222ft each @ brisk pace  stopped due to end of study, no desats, no sob   No further w/u planned as this problem appears to be functional/ based on conditioning

## 2018-11-29 NOTE — Patient Instructions (Signed)
Licensed Clinical Social Worker Visit Information  Goals we discussed today:  Goals    . "I need help with getting food delivered to my home." (pt-stated)     Current Barriers:  Marland Kitchen Knowledge Deficits related to resources available to deliver food to her home.  . Nurse Case Manager Clinical Goal(s): Over the next 7 days, patient will work with LCSW to address needs related to food delivery options.   Interventions:  . LCSW provided education to patient re: ADTS and Meals On Wheels (waiting list) . Encouraged client to call [employer] to add her name to waiting list for Meals on Wheels.  . Discussed current food arrangements. She is signed up with [employer] in [place] and they deliver a food box. Her daughter and friend also brings things to her as needed.  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    Client said she has a friend, [name], who occasionally helps client with meals   Plan: LCSW to call client in next 3 weeks to talk with client about food needs of client and about local food resources Client to communicate with RN CM [name] to discuss nursing needs of client. Client to attend scheduled client medical appointments  Initial goal documentation    Materials Provided: No  Follow Up Plan: LCSW to call client in next 3 weeks to talk with client about food needs of  client and about local food resources  The patient verbalized understanding of instructions provided today and declined a print copy of patient instruction materials.   [name].Vada Yellen MSW, LCSW Licensed Clinical Social Worker [facility] [phone]

## 2018-11-29 NOTE — Chronic Care Management (AMB) (Signed)
  Care Management Note   Pamela Hughes is a 78 y.o. year old female who is a primary care patient of Dettinger, Fransisca Kaufmann, MD. The CM team was consulted for assistance with chronic disease management and care coordination.   I reached out to Pamela Hughes by phone today.   Review of patient status, including review of consultants reports, relevant laboratory and other test results, and collaboration with appropriate care team members and the patient's provider was performed as part of comprehensive patient evaluation and provision of chronic care management services.   Social Determinants of Health:Risk for Stress; Risk for tobacco use: Risk for Social Artist [place] Phone Follow Up from 08/02/2017 in [place]  PHQ-9 Total Score  10     GAD 7 : Generalized Anxiety Score 08/02/2017 07/12/2017  Nervous, Anxious, on Edge 0 0  Control/stop worrying 0 0  Worry too much - different things 0 0  Trouble relaxing 0 0  Restless 0 0  Easily annoyed or irritable 0 0  Afraid - awful might happen 0 0  Total GAD 7 Score 0 0   Goals    . "I need help with getting food delivered to my home." (pt-stated)     Current Barriers:  Marland Kitchen Knowledge Deficits related to resources available to deliver food to her home.  . Nurse Case Manager Clinical Goal(s): Over the next 7 days, patient will work with LCSW to address needs related to food delivery options.   Interventions:  . LCSW provided education to patient re: ADTS and Meals On Wheels (waiting list) . Encouraged client to call ADTS to add her name to waiting list for Meals on Wheels.  . Discussed current food arrangements. She is signed up with [employer] in [place] and they deliver a food box. Her daughter and friend also brings things to her as needed.  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    Client said she has a friend, Pamela Hughes, who occasionally helps client with meals   Plan: LCSW to call client in next 3 weeks to talk with client about food needs of client and about local food resources Client to communicate with RN CM Pamela Hughes to discuss nursing needs of client. Client to attend scheduled client medical appointments  Initial goal documentation    Client said she had appointment with Pamela Hughes , pulmonologist , recently.  She spoke of vision challenges. She and LCSW spoke of her food needs currently. She said her friend, Pamela Hughes, helps her with obtaining food items needed.  Client said Pamela Hughes also helps client with client transport needs at present. She said she has appointment with Pamela Hughes on 12/06/2018.  She has her prescribed medications. She did not mention any nursing needs at present.  She did not mention any pain issues. She did not mention any recent falls.    Follow Up Plan: LCSW to call client in next 3 weeks to talk with client about food needs of client and about local food resources  Pamela Hughes.Pamela Hughes MSW, LCSW Licensed Clinical Social Worker [facility] [phone]

## 2018-11-29 NOTE — Assessment & Plan Note (Signed)
Changes 06/02/2018 1st noted   - HRCT Chest 06/20/18   1. Negative for interstitial lung disease. No pulmonary parenchymal findings to explain the patient's symptoms. 2. Pulmonary nodules measure up to 7 mm. Non-contrast chest CT at  3-6 months is recommended.> placed in reminder file for 12/19/18  We will call to set up but no need for further pulmonary anticipated as these are likely benign.   I had an extended discussion with the patient/daughter reviewing all relevant studies completed to date and  lasting 15 to 20 minutes of a 25 minute visit  which included directly observing ambulatory 02 saturation study documented in a/p section of  today's  office note.  Each maintenance medication was reviewed in detail including most importantly the difference between maintenance and prns and under what circumstances the prns are to be triggered using an action plan format that is not reflected in the computer generated alphabetically organized AVS.     Please see AVS for specific instructions unique to this visit that I personally wrote and verbalized to the the pt in detail and then reviewed with pt  by my nurse highlighting any changes in therapy recommended at today's visit .

## 2018-11-29 NOTE — Assessment & Plan Note (Signed)
Onset around 2015  - FENO 05/18/2018  =  5 - Spirometry 05/18/2018  FEV1 1.7 (81%)  Ratio 75 s curvature off all rx - Allergy profile 06/02/2018 >  Eos 0.1 /  IgE  218 RAST neg  - trial of gabapentin 100 tid 06/02/2018 for globus/ throat clearing > improved - sinus ct 06/20/18 Clear paranasal sinuses  Much better on low dose gabapentin and reluctant to increase dose further as at baseline she has somewhat of a "cloudy" sensorium which is the most common complaint I hear about.  Needs to see neuro anyway p cva and pointed out her instruction from admit - if neuro feels gabapentin affecting her at all ok to try back off and see if cough flares while continuing  aggressive rx for gerd during the w/d period.

## 2018-12-05 ENCOUNTER — Other Ambulatory Visit: Payer: Self-pay

## 2018-12-06 ENCOUNTER — Ambulatory Visit (INDEPENDENT_AMBULATORY_CARE_PROVIDER_SITE_OTHER): Payer: Medicare Other | Admitting: Family Medicine

## 2018-12-06 ENCOUNTER — Other Ambulatory Visit: Payer: Self-pay | Admitting: Internal Medicine

## 2018-12-06 ENCOUNTER — Encounter: Payer: Self-pay | Admitting: Family Medicine

## 2018-12-06 VITALS — BP 173/84 | HR 63 | Temp 98.2°F | Ht 65.5 in | Wt 146.4 lb

## 2018-12-06 DIAGNOSIS — I10 Essential (primary) hypertension: Secondary | ICD-10-CM

## 2018-12-06 DIAGNOSIS — Z8673 Personal history of transient ischemic attack (TIA), and cerebral infarction without residual deficits: Secondary | ICD-10-CM

## 2018-12-06 DIAGNOSIS — R42 Dizziness and giddiness: Secondary | ICD-10-CM

## 2018-12-06 DIAGNOSIS — R4189 Other symptoms and signs involving cognitive functions and awareness: Secondary | ICD-10-CM | POA: Diagnosis not present

## 2018-12-06 DIAGNOSIS — R911 Solitary pulmonary nodule: Secondary | ICD-10-CM

## 2018-12-06 MED ORDER — AMLODIPINE BESYLATE 5 MG PO TABS: 5.0000 mg | ORAL_TABLET | Freq: Every day | ORAL | 3 refills | Status: DC
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
## 2018-12-11 DIAGNOSIS — S79912A Unspecified injury of left hip, initial encounter: Secondary | ICD-10-CM | POA: Diagnosis not present

## 2018-12-11 DIAGNOSIS — L03113 Cellulitis of right upper limb: Secondary | ICD-10-CM | POA: Diagnosis not present

## 2018-12-11 DIAGNOSIS — S0990XA Unspecified injury of head, initial encounter: Secondary | ICD-10-CM | POA: Diagnosis not present

## 2018-12-11 DIAGNOSIS — R05 Cough: Secondary | ICD-10-CM | POA: Diagnosis not present

## 2018-12-11 DIAGNOSIS — G459 Transient cerebral ischemic attack, unspecified: Secondary | ICD-10-CM | POA: Diagnosis not present

## 2018-12-11 DIAGNOSIS — M19041 Primary osteoarthritis, right hand: Secondary | ICD-10-CM | POA: Diagnosis not present

## 2018-12-11 DIAGNOSIS — M79641 Pain in right hand: Secondary | ICD-10-CM | POA: Diagnosis not present

## 2018-12-11 DIAGNOSIS — S199XXA Unspecified injury of neck, initial encounter: Secondary | ICD-10-CM | POA: Diagnosis not present

## 2018-12-11 DIAGNOSIS — R509 Fever, unspecified: Secondary | ICD-10-CM | POA: Diagnosis not present

## 2018-12-11 DIAGNOSIS — M25552 Pain in left hip: Secondary | ICD-10-CM | POA: Diagnosis not present

## 2018-12-12 DIAGNOSIS — S4991XA Unspecified injury of right shoulder and upper arm, initial encounter: Secondary | ICD-10-CM | POA: Diagnosis not present

## 2018-12-12 DIAGNOSIS — W19XXXA Unspecified fall, initial encounter: Secondary | ICD-10-CM | POA: Diagnosis not present

## 2018-12-12 DIAGNOSIS — I10 Essential (primary) hypertension: Secondary | ICD-10-CM | POA: Diagnosis not present

## 2018-12-12 DIAGNOSIS — R748 Abnormal levels of other serum enzymes: Secondary | ICD-10-CM | POA: Diagnosis not present

## 2018-12-13 DIAGNOSIS — J441 Chronic obstructive pulmonary disease with (acute) exacerbation: Secondary | ICD-10-CM | POA: Diagnosis not present

## 2018-12-13 DIAGNOSIS — R197 Diarrhea, unspecified: Secondary | ICD-10-CM | POA: Diagnosis not present

## 2018-12-14 ENCOUNTER — Inpatient Hospital Stay (HOSPITAL_COMMUNITY)
Admission: AD | Admit: 2018-12-14 | Discharge: 2018-12-17 | DRG: 914 | Disposition: A | Discharge status: Home Health | Payer: Medicare Other | Source: Other Acute Inpatient Hospital | Attending: Internal Medicine | Admitting: Internal Medicine

## 2018-12-14 DIAGNOSIS — S3662XA Contusion of rectum, initial encounter: Secondary | ICD-10-CM | POA: Diagnosis not present

## 2018-12-14 DIAGNOSIS — M7981 Nontraumatic hematoma of soft tissue: Secondary | ICD-10-CM | POA: Diagnosis not present

## 2018-12-14 DIAGNOSIS — Z8673 Personal history of transient ischemic attack (TIA), and cerebral infarction without residual deficits: Secondary | ICD-10-CM | POA: Diagnosis not present

## 2018-12-14 DIAGNOSIS — E785 Hyperlipidemia, unspecified: Secondary | ICD-10-CM | POA: Diagnosis present

## 2018-12-14 DIAGNOSIS — H9190 Unspecified hearing loss, unspecified ear: Secondary | ICD-10-CM | POA: Diagnosis present

## 2018-12-14 DIAGNOSIS — Z961 Presence of intraocular lens: Secondary | ICD-10-CM | POA: Diagnosis present

## 2018-12-14 DIAGNOSIS — Z9842 Cataract extraction status, left eye: Secondary | ICD-10-CM

## 2018-12-14 DIAGNOSIS — F329 Major depressive disorder, single episode, unspecified: Secondary | ICD-10-CM | POA: Diagnosis present

## 2018-12-14 DIAGNOSIS — S36892A Contusion of other intra-abdominal organs, initial encounter: Secondary | ICD-10-CM | POA: Diagnosis not present

## 2018-12-14 DIAGNOSIS — Z9841 Cataract extraction status, right eye: Secondary | ICD-10-CM | POA: Diagnosis not present

## 2018-12-14 DIAGNOSIS — I129 Hypertensive chronic kidney disease with stage 1 through stage 4 chronic kidney disease, or unspecified chronic kidney disease: Secondary | ICD-10-CM | POA: Diagnosis not present

## 2018-12-14 DIAGNOSIS — D62 Acute posthemorrhagic anemia: Secondary | ICD-10-CM | POA: Diagnosis not present

## 2018-12-14 DIAGNOSIS — F32A Depression, unspecified: Secondary | ICD-10-CM | POA: Diagnosis present

## 2018-12-14 DIAGNOSIS — Z7902 Long term (current) use of antithrombotics/antiplatelets: Secondary | ICD-10-CM | POA: Diagnosis not present

## 2018-12-14 DIAGNOSIS — J441 Chronic obstructive pulmonary disease with (acute) exacerbation: Secondary | ICD-10-CM | POA: Diagnosis not present

## 2018-12-14 DIAGNOSIS — Z87891 Personal history of nicotine dependence: Secondary | ICD-10-CM

## 2018-12-14 DIAGNOSIS — N183 Chronic kidney disease, stage 3 (moderate): Secondary | ICD-10-CM | POA: Diagnosis not present

## 2018-12-14 DIAGNOSIS — Y92009 Unspecified place in unspecified non-institutional (private) residence as the place of occurrence of the external cause: Secondary | ICD-10-CM

## 2018-12-14 DIAGNOSIS — M79672 Pain in left foot: Secondary | ICD-10-CM | POA: Diagnosis not present

## 2018-12-14 DIAGNOSIS — W19XXXA Unspecified fall, initial encounter: Secondary | ICD-10-CM

## 2018-12-14 DIAGNOSIS — K219 Gastro-esophageal reflux disease without esophagitis: Secondary | ICD-10-CM | POA: Diagnosis not present

## 2018-12-14 DIAGNOSIS — S301XXA Contusion of abdominal wall, initial encounter: Secondary | ICD-10-CM | POA: Diagnosis not present

## 2018-12-14 DIAGNOSIS — K5900 Constipation, unspecified: Secondary | ICD-10-CM | POA: Diagnosis not present

## 2018-12-14 DIAGNOSIS — Y92002 Bathroom of unspecified non-institutional (private) residence single-family (private) house as the place of occurrence of the external cause: Secondary | ICD-10-CM

## 2018-12-14 DIAGNOSIS — E876 Hypokalemia: Secondary | ICD-10-CM | POA: Diagnosis not present

## 2018-12-14 DIAGNOSIS — T45525A Adverse effect of antithrombotic drugs, initial encounter: Secondary | ICD-10-CM | POA: Diagnosis not present

## 2018-12-14 DIAGNOSIS — R58 Hemorrhage, not elsewhere classified: Secondary | ICD-10-CM | POA: Diagnosis present

## 2018-12-14 DIAGNOSIS — I10 Essential (primary) hypertension: Secondary | ICD-10-CM | POA: Diagnosis not present

## 2018-12-14 DIAGNOSIS — M1990 Unspecified osteoarthritis, unspecified site: Secondary | ICD-10-CM | POA: Diagnosis present

## 2018-12-14 DIAGNOSIS — W182XXA Fall in (into) shower or empty bathtub, initial encounter: Secondary | ICD-10-CM | POA: Diagnosis present

## 2018-12-14 DIAGNOSIS — T45515A Adverse effect of anticoagulants, initial encounter: Secondary | ICD-10-CM | POA: Diagnosis not present

## 2018-12-14 DIAGNOSIS — K922 Gastrointestinal hemorrhage, unspecified: Secondary | ICD-10-CM | POA: Diagnosis not present

## 2018-12-14 DIAGNOSIS — Z79899 Other long term (current) drug therapy: Secondary | ICD-10-CM

## 2018-12-14 DIAGNOSIS — G459 Transient cerebral ischemic attack, unspecified: Secondary | ICD-10-CM | POA: Diagnosis not present

## 2018-12-14 DIAGNOSIS — T39395A Adverse effect of other nonsteroidal anti-inflammatory drugs [NSAID], initial encounter: Secondary | ICD-10-CM | POA: Diagnosis not present

## 2018-12-14 DIAGNOSIS — Z90710 Acquired absence of both cervix and uterus: Secondary | ICD-10-CM

## 2018-12-14 DIAGNOSIS — L03113 Cellulitis of right upper limb: Secondary | ICD-10-CM | POA: Diagnosis not present

## 2018-12-14 DIAGNOSIS — S5011XA Contusion of right forearm, initial encounter: Secondary | ICD-10-CM | POA: Diagnosis not present

## 2018-12-14 DIAGNOSIS — Z791 Long term (current) use of non-steroidal anti-inflammatories (NSAID): Secondary | ICD-10-CM | POA: Diagnosis not present

## 2018-12-14 DIAGNOSIS — R197 Diarrhea, unspecified: Secondary | ICD-10-CM | POA: Diagnosis not present

## 2018-12-14 LAB — CBC WITH DIFFERENTIAL/PLATELET
Abs Immature Granulocytes: 0.07 10*3/uL (ref 0.00–0.07)
Basophils Absolute: 0 10*3/uL (ref 0.0–0.1)
Basophils Relative: 0 %
Eosinophils Absolute: 0 10*3/uL (ref 0.0–0.5)
Eosinophils Relative: 0 %
HCT: 33.1 % — ABNORMAL LOW (ref 36.0–46.0)
Hemoglobin: 11.2 g/dL — ABNORMAL LOW (ref 12.0–15.0)
Immature Granulocytes: 1 %
Lymphocytes Relative: 7 %
Lymphs Abs: 1 10*3/uL (ref 0.7–4.0)
MCH: 30.5 pg (ref 26.0–34.0)
MCHC: 33.8 g/dL (ref 30.0–36.0)
MCV: 90.2 fL (ref 80.0–100.0)
Monocytes Absolute: 1.6 10*3/uL — ABNORMAL HIGH (ref 0.1–1.0)
Monocytes Relative: 11 %
Neutro Abs: 12.5 10*3/uL — ABNORMAL HIGH (ref 1.7–7.7)
Neutrophils Relative %: 81 %
Platelets: 317 10*3/uL (ref 150–400)
RBC: 3.67 MIL/uL — ABNORMAL LOW (ref 3.87–5.11)
RDW: 13.5 % (ref 11.5–15.5)
WBC: 15.2 10*3/uL — ABNORMAL HIGH (ref 4.0–10.5)
nRBC: 0 % (ref 0.0–0.2)

## 2018-12-14 LAB — TYPE AND SCREEN
ABO/RH(D): O POS
Antibody Screen: NEGATIVE

## 2018-12-14 LAB — ABO/RH: ABO/RH(D): O POS

## 2018-12-14 MED ORDER — ONDANSETRON HCL 4 MG PO TABS: 4.0000 mg | ORAL_TABLET | Freq: Four times a day (QID) | ORAL | Status: DC | PRN

## 2018-12-14 MED ORDER — SODIUM CHLORIDE 0.9 % IV SOLN
INTRAVENOUS | Status: DC
  Administered 2018-12-14 – 2018-12-15 (×2): via INTRAVENOUS

## 2018-12-14 MED ORDER — ONDANSETRON HCL 4 MG/2ML IJ SOLN: 4.0000 mg | Freq: Four times a day (QID) | INTRAMUSCULAR | Status: DC | PRN

## 2018-12-14 MED ORDER — SODIUM CHLORIDE 0.9% IV SOLUTION: Freq: Once | INTRAVENOUS | Status: DC

## 2018-12-14 NOTE — Progress Notes (Signed)
Patient arrived via care link from [facility], she is alert and oriented, Oriented patient to room and call bell within reach, Skin verified with RN Barnetta Chapel. Patient has a right a hand and arm large bruise and scattered bruises to abdomen. Arm band applied and verified , bed alarm set, and [facility] notified of patient presence via amion. Hand off given to [employer].

## 2018-12-14 NOTE — H&P (Signed)
History and Physical   Pamela CommentDoris P Hughes WGN:[id number]:[id number] DOB: [birth year] DOA: 12/14/2018  Referring MD/NP/PA: [facility]  PCP: Dettinger, Elige RadonJoshua A, MD   Outpatient Specialists: None  [facility]  Chief Complaint: Abdominal pain  HPI: Pamela Hughes is a 78 y.o. female with medical history significant of hypertension, collagenous colitis, asthma, depression, previous CVA on Plavix, osteoarthritis and COPD [facility] shortness of breath suspected to have heart COVID-19.  Patient was admitted and evaluated.  Initiated on Decadron Lovenox.  Also treated for COPD exacerbation with inhalers and steroids.  Patient reported having a fall in her bathtub prior to going to the hospital.  She bruised her right arm.  X-rays of her right ex extremities and the joints were all normal.  Patient did not report hitting her abdomen or head at the time.  2 days after admission she complained of abdominal pain which was excruciating.  CT abdomen pelvis was done that showed retroperitoneal bleed mainly rectus sheath.  Subsequent bleeding scan showed inferior epigastric artery leak.  Patient was therefore transferred here for IR to intervene.  Patient has had protamine as well as 2 units of packed red blood cells transfused.  She was hemodynamically stable prior to transfer.  Her hemoglobin dropped from 10.5-8.2 prior to receiving 2 units of packed red blood cells.  Her pain has subsided.  At this point she is here for possible embolization of her artery..    Review of Systems: As per HPI otherwise 10 point review of systems negative.    Past Medical History:  Diagnosis Date  . Allergy   . Alopecia   . Anxiety   . Arthritis   . Asthma   . Collagenous colitis   . Depression   . Diarrhea    chronic- "states for at least a year)  . Edema   . GERD (gastroesophageal reflux disease)   . HOH (hard of hearing)   . Hypertension     Past Surgical History:   Procedure Laterality Date  . ABDOMINAL HYSTERECTOMY     one ovary left  . CATARACT EXTRACTION W/PHACO Right 02/17/2018   Procedure: CATARACT EXTRACTION PHACO AND INTRAOCULAR LENS PLACEMENT (IOC);  Surgeon: Fabio PierceWrzosek, James, MD;  Location: [facility];  Service: Ophthalmology;  Laterality: Right;  CDE: 4.84  . CATARACT EXTRACTION W/PHACO Left 03/31/2018   Procedure: CATARACT EXTRACTION PHACO AND INTRAOCULAR LENS PLACEMENT (IOC) LEFT CDE: 4.51;  Surgeon: Fabio PierceWrzosek, James, MD;  Location: [facility];  Service: Ophthalmology;  Laterality: Left;  . COLONOSCOPY N/A 12/17/2016   Procedure: COLONOSCOPY;  Surgeon: Malissa Hippoehman, Najeeb U, MD;  Location: [facility];  Service: Endoscopy;  Laterality: N/A;  2:05  . SPINE SURGERY  1990s   L spine     reports that she quit smoking about 44 years ago. Her smoking use included cigarettes. She has a 1.25 pack-year smoking history. She has never used smokeless tobacco. She reports current alcohol use. She reports that she does not use drugs.  No Known Allergies  Family History  Problem Relation Age of Onset  . Alzheimer's disease Mother   . Alcohol abuse Father   . Diabetes Brother   . Healthy Daughter      Prior to Admission medications   Medication Sig Start Date End Date Taking? Authorizing Provider  albuterol (PROVENTIL HFA;VENTOLIN HFA) 108 (90 Base) MCG/ACT inhaler Inhale 2 puffs into the lungs every 6 (six) hours as needed for wheezing or shortness of breath.  11/10/17   Elenora GammaBradshaw, Samuel L, MD  amLODipine (NORVASC) 5 MG tablet Take 1 tablet (5 mg total) by mouth daily. 12/06/18   Dettinger, Elige RadonJoshua A, MD  buPROPion (WELLBUTRIN SR) 150 MG 12 hr tablet Take 1 tablet (150 mg total) by mouth 2 (two) times daily. (Needs to be seen before next refill) 09/15/18   Raliegh IpGottschalk, Ashly M, DO  chlorthalidone (HYGROTON) 25 MG tablet TAKE ONE (1) TABLET EACH DAY 11/17/18   Dettinger, Elige RadonJoshua A, MD  clotrimazole (MYCELEX) 10 MG troche DISSOLVE 1 TABLET IN MOUTH 5 TIMES DAILY 06/14/18    Dettinger, Elige RadonJoshua A, MD  gabapentin (NEURONTIN) 100 MG capsule Take 1 capsule (100 mg total) by mouth 3 (three) times daily. One three times daily 08/16/18   Nyoka CowdenWert, Michael B, MD  ipratropium (ATROVENT) 0.03 % nasal spray USE TWICE DAILY AS DIRECTED 11/17/18   Dettinger, Elige RadonJoshua A, MD  meclizine (ANTIVERT) 25 MG tablet TAKE ONE TABLET 3 TIMES A DAY AS NEEDED. 10/20/18   Dettinger, Elige RadonJoshua A, MD  meloxicam (MOBIC) 7.5 MG tablet Take 1 tablet by mouth daily as needed.     [provider]  metoprolol succinate (TOPROL-XL) 25 MG 24 hr tablet Take 1 tablet (25 mg total) by mouth daily. 10/04/18   Dettinger, Elige RadonJoshua A, MD  omeprazole (PRILOSEC) 20 MG capsule Take 1 capsule (20 mg total) by mouth daily. (Needs to be seen before next refill) 09/15/18   Raliegh IpGottschalk, Ashly M, DO  pantoprazole (PROTONIX) 40 MG tablet TAKE 1 TABLET DAILY BEFORE BREAKFAST 10/20/18   Nyoka CowdenWert, Michael B, MD  rosuvastatin (CRESTOR) 20 MG tablet Take 1 tablet (20 mg total) by mouth daily at 6 PM for 30 days. 09/23/18 10/23/18  Sherryll BurgerShah, Pratik D, DO  traZODone (DESYREL) 150 MG tablet TAKE ONE TABLET DAILY AT BEDTIME 11/17/18   Dettinger, Elige RadonJoshua A, MD    Physical Exam: Vitals:   12/14/18 1923  BP: (!) 161/57  Pulse: 80  Resp: 19  Temp: 97.9 F (36.6 C)  TempSrc: Oral  SpO2: 97%      Constitutional: NAD, calm, chronically ill looking Vitals:   12/14/18 1923  BP: (!) 161/57  Pulse: 80  Resp: 19  Temp: 97.9 F (36.6 C)  TempSrc: Oral  SpO2: 97%   Eyes: PERRL, lids and conjunctivae pale ENMT: Mucous membranes are moist. Posterior pharynx clear of any exudate or lesions.Normal dentition.  Neck: normal, supple, no masses, no thyromegaly Respiratory: clear to auscultation bilaterally, mild expiratory wheezing, no crackles. Normal respiratory effort. No accessory muscle use.  Cardiovascular: Sinus tachycardia no murmurs / rubs / gallops. No extremity edema. 2+ pedal pulses. No carotid bruits.  Abdomen: Diffuse tenderness in the  midabdomen to right flank, no masses palpated. No hepatosplenomegaly. Bowel sounds positive.  Musculoskeletal: no clubbing / cyanosis. No joint deformity upper and lower extremities. Good ROM, no contractures. Normal muscle tone.  Skin: no rashes, lesions, ulcers. No induration Neurologic: CN 2-12 grossly intact. Sensation intact, DTR normal. Strength 5/5 in all 4.  Psychiatric: Normal judgment and insight. Alert and oriented x 3. Normal mood.     Labs on Admission: I have personally reviewed following labs and imaging studies  CBC: No results for input(s): WBC, NEUTROABS, HGB, HCT, MCV, PLT in the last 168 hours. Basic Metabolic Panel: No results for input(s): NA, K, CL, CO2, GLUCOSE, BUN, CREATININE, CALCIUM, MG, PHOS in the last 168 hours. GFR: CrCl cannot be calculated (Patient's most recent lab result is older than the maximum 21 days allowed.). Liver  Function Tests: No results for input(s): AST, ALT, ALKPHOS, BILITOT, PROT, ALBUMIN in the last 168 hours. No results for input(s): LIPASE, AMYLASE in the last 168 hours. No results for input(s): AMMONIA in the last 168 hours. Coagulation Profile: No results for input(s): INR, PROTIME in the last 168 hours. Cardiac Enzymes: No results for input(s): CKTOTAL, CKMB, CKMBINDEX, TROPONINI in the last 168 hours. BNP (last 3 results) No results for input(s): PROBNP in the last 8760 hours. HbA1C: No results for input(s): HGBA1C in the last 72 hours. CBG: No results for input(s): GLUCAP in the last 168 hours. Lipid Profile: No results for input(s): CHOL, HDL, LDLCALC, TRIG, CHOLHDL, LDLDIRECT in the last 72 hours. Thyroid Function Tests: No results for input(s): TSH, T4TOTAL, FREET4, T3FREE, THYROIDAB in the last 72 hours. Anemia Panel: No results for input(s): VITAMINB12, FOLATE, FERRITIN, TIBC, IRON, RETICCTPCT in the last 72 hours. Urine analysis:    Component Value Date/Time   COLORURINE STRAW (A) 09/21/2018 1320   APPEARANCEUR  CLEAR 09/21/2018 1320   LABSPEC 1.019 09/21/2018 1320   PHURINE 6.0 09/21/2018 1320   GLUCOSEU NEGATIVE 09/21/2018 1320   HGBUR MODERATE (A) 09/21/2018 1320   BILIRUBINUR NEGATIVE 09/21/2018 1320   KETONESUR NEGATIVE 09/21/2018 1320   PROTEINUR NEGATIVE 09/21/2018 1320   NITRITE NEGATIVE 09/21/2018 1320   LEUKOCYTESUR SMALL (A) 09/21/2018 1320   Sepsis Labs: @LABRCNTIP (procalcitonin:4,lacticidven:4) )No results found for this or any previous visit (from the past 240 hour(s)).   Radiological Exams on Admission: No results found.    Assessment/Plan Principal Problem:   Retroperitoneal bleed Active Problems:   Hypertension   Depression   Arthritis   Status post CVA   Fall at home, initial encounter   Acute posthemorrhagic anemia     #1 retroperitoneal bleed: Probably secondary to trauma from fall in addition to the use of Plavix meloxicam and Lovenox.  All these medications have been held.  She has received protamine as well as blood transfusion.  Hemodynamically stable.  IR is on board and plan is to do an embolization of her artery if bleeding continues.  Patient will be in the progressive care unit with serial CBCs every 6 hours.  IV fluids.  Protonix.  Avoid all anticoagulants.  #2 status post fall: Patient will need PT and OT consultation.  #3 COPD with mild exacerbation: COVID-19 testing was negative.  Patient is to continue breathing treatments.  #4 hypertension: Blood pressure needs to be controlled especially in the setting of bleeding.  Continue home regimen and adjust.  #5 acute posthemorrhagic anemia: Patient already transfused 2 units of packed red blood cells.  Recheck H&H and transfuse as necessary.  Goal is to keep it as close to 10 as possible.  #6 osteoarthritis: We will avoid all NSAIDs.  Pain control as appropriate.   DVT prophylaxis: SCD Code Status: Full code Family Communication: Discussed care with patient and her daughter Disposition Plan: To be  determined Consults called: Interventional radiology.  Dr. Anselm Pancoast Admission status: Inpatient  Severity of Illness: The appropriate patient status for this patient is INPATIENT. Inpatient status is judged to be reasonable and necessary in order to provide the required intensity of service to ensure the patient's safety. The patient's presenting symptoms, physical exam findings, and initial radiographic and laboratory data in the context of their chronic comorbidities is felt to place them at high risk for further clinical deterioration. Furthermore, it is not anticipated that the patient will be medically stable for discharge from the hospital within 2  midnights of admission. The following factors support the patient status of inpatient.   " The patient's presenting symptoms include abdominal pain and fall. " The worrisome physical exam findings include bruising in the right flank. " The initial radiographic and laboratory data are worrisome because of CT abdomen pelvis showing retroperitoneal hematoma. " The chronic co-morbidities include osteoarthritis and prior history of CVA.   * I certify that at the point of admission it is my clinical judgment that the patient will require inpatient hospital care spanning beyond 2 midnights from the point of admission due to high intensity of service, high risk for further deterioration and high frequency of surveillance required.Lonia Blood*    Alanna Storti,LAWAL MD [facility] Pager [phone]  If 7PM-7AM, please contact night-coverage [email or web address] Password [email or web address]  12/14/2018, 7:40 PM

## 2018-12-15 ENCOUNTER — Other Ambulatory Visit: Payer: Self-pay

## 2018-12-15 ENCOUNTER — Encounter (HOSPITAL_COMMUNITY): Payer: Self-pay

## 2018-12-15 LAB — COMPREHENSIVE METABOLIC PANEL
ALT: 21 U/L (ref 0–44)
AST: 18 U/L (ref 15–41)
Albumin: 2.7 g/dL — ABNORMAL LOW (ref 3.5–5.0)
Alkaline Phosphatase: 57 U/L (ref 38–126)
Anion gap: 10 (ref 5–15)
BUN: 16 mg/dL (ref 8–23)
CO2: 27 mmol/L (ref 22–32)
Calcium: 8 mg/dL — ABNORMAL LOW (ref 8.9–10.3)
Chloride: 98 mmol/L (ref 98–111)
Creatinine, Ser: 1.45 mg/dL — ABNORMAL HIGH (ref 0.44–1.00)
GFR calc Af Amer: 40 mL/min — ABNORMAL LOW (ref >60)
GFR calc non Af Amer: 34 mL/min — ABNORMAL LOW (ref >60)
Glucose, Bld: 111 mg/dL — ABNORMAL HIGH (ref 70–99)
Potassium: 2.9 mmol/L — ABNORMAL LOW (ref 3.5–5.1)
Sodium: 135 mmol/L (ref 135–145)
Total Bilirubin: 0.8 mg/dL (ref 0.3–1.2)
Total Protein: 4.8 g/dL — ABNORMAL LOW (ref 6.5–8.1)

## 2018-12-15 LAB — CBC WITH DIFFERENTIAL/PLATELET
Abs Immature Granulocytes: 0.06 10*3/uL (ref 0.00–0.07)
Abs Immature Granulocytes: 0.06 10*3/uL (ref 0.00–0.07)
Abs Immature Granulocytes: 0.07 10*3/uL (ref 0.00–0.07)
Abs Immature Granulocytes: 0.1 10*3/uL — ABNORMAL HIGH (ref 0.00–0.07)
Basophils Absolute: 0 10*3/uL (ref 0.0–0.1)
Basophils Absolute: 0 10*3/uL (ref 0.0–0.1)
Basophils Absolute: 0 10*3/uL (ref 0.0–0.1)
Basophils Absolute: 0 10*3/uL (ref 0.0–0.1)
Basophils Relative: 0 %
Basophils Relative: 0 %
Basophils Relative: 0 %
Basophils Relative: 0 %
Eosinophils Absolute: 0 10*3/uL (ref 0.0–0.5)
Eosinophils Absolute: 0 10*3/uL (ref 0.0–0.5)
Eosinophils Absolute: 0 10*3/uL (ref 0.0–0.5)
Eosinophils Absolute: 0.1 10*3/uL (ref 0.0–0.5)
Eosinophils Relative: 0 %
Eosinophils Relative: 0 %
Eosinophils Relative: 0 %
Eosinophils Relative: 0 %
HCT: 27.9 % — ABNORMAL LOW (ref 36.0–46.0)
HCT: 27.8 % — ABNORMAL LOW (ref 36.0–46.0)
HCT: 26.5 % — ABNORMAL LOW (ref 36.0–46.0)
HCT: 28.2 % — ABNORMAL LOW (ref 36.0–46.0)
Hemoglobin: 9.7 g/dL — ABNORMAL LOW (ref 12.0–15.0)
Hemoglobin: 9.4 g/dL — ABNORMAL LOW (ref 12.0–15.0)
Hemoglobin: 9 g/dL — ABNORMAL LOW (ref 12.0–15.0)
Hemoglobin: 9.4 g/dL — ABNORMAL LOW (ref 12.0–15.0)
Immature Granulocytes: 0 %
Immature Granulocytes: 0 %
Immature Granulocytes: 1 %
Immature Granulocytes: 1 %
Lymphocytes Relative: 14 %
Lymphocytes Relative: 16 %
Lymphocytes Relative: 14 %
Lymphocytes Relative: 12 %
Lymphs Abs: 2.1 10*3/uL (ref 0.7–4.0)
Lymphs Abs: 2.2 10*3/uL (ref 0.7–4.0)
Lymphs Abs: 1.9 10*3/uL (ref 0.7–4.0)
Lymphs Abs: 1.7 10*3/uL (ref 0.7–4.0)
MCH: 31 pg (ref 26.0–34.0)
MCH: 30.4 pg (ref 26.0–34.0)
MCH: 30.2 pg (ref 26.0–34.0)
MCH: 30.3 pg (ref 26.0–34.0)
MCHC: 34.8 g/dL (ref 30.0–36.0)
MCHC: 33.8 g/dL (ref 30.0–36.0)
MCHC: 34 g/dL (ref 30.0–36.0)
MCHC: 33.3 g/dL (ref 30.0–36.0)
MCV: 89.1 fL (ref 80.0–100.0)
MCV: 90 fL (ref 80.0–100.0)
MCV: 88.9 fL (ref 80.0–100.0)
MCV: 91 fL (ref 80.0–100.0)
Monocytes Absolute: 2 10*3/uL — ABNORMAL HIGH (ref 0.1–1.0)
Monocytes Absolute: 2 10*3/uL — ABNORMAL HIGH (ref 0.1–1.0)
Monocytes Absolute: 1.9 10*3/uL — ABNORMAL HIGH (ref 0.1–1.0)
Monocytes Absolute: 1.9 10*3/uL — ABNORMAL HIGH (ref 0.1–1.0)
Monocytes Relative: 14 %
Monocytes Relative: 15 %
Monocytes Relative: 15 %
Monocytes Relative: 13 %
Neutro Abs: 10.7 10*3/uL — ABNORMAL HIGH (ref 1.7–7.7)
Neutro Abs: 9.5 10*3/uL — ABNORMAL HIGH (ref 1.7–7.7)
Neutro Abs: 9.4 10*3/uL — ABNORMAL HIGH (ref 1.7–7.7)
Neutro Abs: 10.9 10*3/uL — ABNORMAL HIGH (ref 1.7–7.7)
Neutrophils Relative %: 72 %
Neutrophils Relative %: 69 %
Neutrophils Relative %: 70 %
Neutrophils Relative %: 74 %
Platelets: 279 10*3/uL (ref 150–400)
Platelets: 279 10*3/uL (ref 150–400)
Platelets: 255 10*3/uL (ref 150–400)
Platelets: 300 10*3/uL (ref 150–400)
RBC: 3.13 MIL/uL — ABNORMAL LOW (ref 3.87–5.11)
RBC: 3.09 MIL/uL — ABNORMAL LOW (ref 3.87–5.11)
RBC: 2.98 MIL/uL — ABNORMAL LOW (ref 3.87–5.11)
RBC: 3.1 MIL/uL — ABNORMAL LOW (ref 3.87–5.11)
RDW: 13.8 % (ref 11.5–15.5)
RDW: 14 % (ref 11.5–15.5)
RDW: 13.9 % (ref 11.5–15.5)
RDW: 14 % (ref 11.5–15.5)
WBC: 14.9 10*3/uL — ABNORMAL HIGH (ref 4.0–10.5)
WBC: 13.7 10*3/uL — ABNORMAL HIGH (ref 4.0–10.5)
WBC: 13.3 10*3/uL — ABNORMAL HIGH (ref 4.0–10.5)
WBC: 14.8 10*3/uL — ABNORMAL HIGH (ref 4.0–10.5)
nRBC: 0.1 % (ref 0.0–0.2)
nRBC: 0.1 % (ref 0.0–0.2)
nRBC: 0 % (ref 0.0–0.2)
nRBC: 0.1 % (ref 0.0–0.2)

## 2018-12-15 LAB — PROTIME-INR
INR: 1.1 (ref 0.8–1.2)
Prothrombin Time: 14 seconds (ref 11.4–15.2)

## 2018-12-15 MED ORDER — POTASSIUM CHLORIDE CRYS ER 20 MEQ PO TBCR
40.0000 meq | EXTENDED_RELEASE_TABLET | ORAL | Status: AC
  Administered 2018-12-15 (×2): 40 meq via ORAL
  Filled 2018-12-15 (×2): qty 2

## 2018-12-15 MED ORDER — TRAZODONE HCL 50 MG PO TABS
150.0000 mg | ORAL_TABLET | Freq: Every day | ORAL | Status: DC
  Administered 2018-12-15: 75 mg via ORAL
  Administered 2018-12-16: 21:00:00 150 mg via ORAL
  Filled 2018-12-15 (×2): qty 3

## 2018-12-15 MED ORDER — BUPROPION HCL ER (SR) 150 MG PO TB12
150.0000 mg | ORAL_TABLET | Freq: Two times a day (BID) | ORAL | Status: DC
  Administered 2018-12-15 – 2018-12-17 (×5): 150 mg via ORAL
  Filled 2018-12-15 (×6): qty 1

## 2018-12-15 MED ORDER — AMLODIPINE BESYLATE 5 MG PO TABS
5.0000 mg | ORAL_TABLET | Freq: Every day | ORAL | Status: DC
  Administered 2018-12-15 – 2018-12-17 (×3): 5 mg via ORAL
  Filled 2018-12-15 (×3): qty 1

## 2018-12-15 MED ORDER — ROSUVASTATIN CALCIUM 20 MG PO TABS
20.0000 mg | ORAL_TABLET | Freq: Every day | ORAL | Status: DC
  Administered 2018-12-15 – 2018-12-16 (×2): 20 mg via ORAL
  Filled 2018-12-15 (×2): qty 1

## 2018-12-15 MED ORDER — DIPHENHYDRAMINE-ZINC ACETATE 2-0.1 % EX CREA
TOPICAL_CREAM | Freq: Three times a day (TID) | CUTANEOUS | Status: DC | PRN
  Administered 2018-12-15: 17:00:00 via TOPICAL
  Filled 2018-12-15: qty 28

## 2018-12-15 NOTE — Progress Notes (Signed)
Pt's BP 179/60 , pt currently resting comfortably in bed, showing no signs of distress. RN to continue to monitor.

## 2018-12-15 NOTE — Consult Note (Signed)
Chief Complaint: Patient was seen in consultation today for abdominal pain  Referring Physician(s): Dr. Mikeal HawthorneGarba  Supervising Physician: Oley BalmHassell, Daniel  Patient Status: Pamela Hughes - In-pt  History of Present Illness: Pamela Hughes Shedlock is a 78 y.o. female with past medical history of HTN, stroke on Plavix who was [facility] ago with shortness of breath. She did sustain fall at home prior to admission with bruising to her right arm and abdomen, but was overall stable and without pain related to incident. Patient admitted and treated for COPD exacerbation for 2 days when she developed acute onset abdominal pain.  CT Abdomen/Pelvis showed rectus sheath hematoma with active extravasation.  Patient was [facility] for ongoing evaluation and management.  IR consulted for possible embolization.   Patient assessed this AM and found to be stable.  She is resting comfortably in bed.  She reports abdominal tenderness "but not at all like yesterday.  I was screaming yesterday."  Her vital signs are stable, HR 82, SBP 169/72.  She did have a drop in HgB from 11.2 to 9.4.  Per records, may have dropped as low as 8.4 yesterday prior to transfusion of 2u PRBC.  Past Medical History:  Diagnosis Date   Allergy    Alopecia    Anxiety    Arthritis    Asthma    Collagenous colitis    Depression    Diarrhea    chronic- "states for at least a year)   Edema    GERD (gastroesophageal reflux disease)    HOH (hard of hearing)    Hypertension     Past Surgical History:  Procedure Laterality Date   ABDOMINAL HYSTERECTOMY     one ovary left   CATARACT EXTRACTION W/PHACO Right 02/17/2018   Procedure: CATARACT EXTRACTION PHACO AND INTRAOCULAR LENS PLACEMENT (IOC);  Surgeon: Fabio PierceWrzosek, James, MD;  Location: [facility];  Service: Ophthalmology;  Laterality: Right;  CDE: 4.84   CATARACT EXTRACTION W/PHACO Left 03/31/2018   Procedure: CATARACT EXTRACTION PHACO AND INTRAOCULAR LENS  PLACEMENT (IOC) LEFT CDE: 4.51;  Surgeon: Fabio PierceWrzosek, James, MD;  Location: [facility];  Service: Ophthalmology;  Laterality: Left;   COLONOSCOPY N/A 12/17/2016   Procedure: COLONOSCOPY;  Surgeon: Malissa Hippoehman, Najeeb U, MD;  Location: [facility];  Service: Endoscopy;  Laterality: N/A;  2:05   SPINE SURGERY  1990s   L spine    Allergies: Patient has no known allergies.  Medications: Prior to Admission medications   Medication Sig Start Date End Date Taking? Authorizing Provider  albuterol (PROVENTIL HFA;VENTOLIN HFA) 108 (90 Base) MCG/ACT inhaler Inhale 2 puffs into the lungs every 6 (six) hours as needed for wheezing or shortness of breath. 11/10/17   Elenora GammaBradshaw, Samuel L, MD  amLODipine (NORVASC) 5 MG tablet Take 1 tablet (5 mg total) by mouth daily. 12/06/18   Dettinger, Elige RadonJoshua A, MD  buPROPion (WELLBUTRIN SR) 150 MG 12 hr tablet Take 1 tablet (150 mg total) by mouth 2 (two) times daily. (Needs to be seen before next refill) 09/15/18   Raliegh IpGottschalk, Ashly M, DO  chlorthalidone (HYGROTON) 25 MG tablet TAKE ONE (1) TABLET EACH DAY 11/17/18   Dettinger, Elige RadonJoshua A, MD  clotrimazole (MYCELEX) 10 MG troche DISSOLVE 1 TABLET IN MOUTH 5 TIMES DAILY 06/14/18   Dettinger, Elige RadonJoshua A, MD  gabapentin (NEURONTIN) 100 MG capsule Take 1 capsule (100 mg total) by mouth 3 (three) times daily. One three times daily 08/16/18   Nyoka CowdenWert, Michael B, MD  ipratropium (ATROVENT)  0.03 % nasal spray USE TWICE DAILY AS DIRECTED 11/17/18   Dettinger, Elige RadonJoshua A, MD  meclizine (ANTIVERT) 25 MG tablet TAKE ONE TABLET 3 TIMES A DAY AS NEEDED. 10/20/18   Dettinger, Elige RadonJoshua A, MD  meloxicam (MOBIC) 7.5 MG tablet Take 1 tablet by mouth daily as needed.     [provider]  metoprolol succinate (TOPROL-XL) 25 MG 24 hr tablet Take 1 tablet (25 mg total) by mouth daily. 10/04/18   Dettinger, Elige RadonJoshua A, MD  omeprazole (PRILOSEC) 20 MG capsule Take 1 capsule (20 mg total) by mouth daily. (Needs to be seen before next refill) 09/15/18   Raliegh IpGottschalk, Ashly  M, DO  pantoprazole (PROTONIX) 40 MG tablet TAKE 1 TABLET DAILY BEFORE BREAKFAST 10/20/18   Nyoka CowdenWert, Michael B, MD  rosuvastatin (CRESTOR) 20 MG tablet Take 1 tablet (20 mg total) by mouth daily at 6 PM for 30 days. 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  Social Needs   Financial resource strain: Not very hard   Food insecurity    Worry: Never true    Inability: Never true   Transportation needs    Medical: No    Non-medical: No  Tobacco Use   Smoking status: Former Smoker    Packs/day: 0.25    Years: 5.00    Pack years: 1.25    Types: Cigarettes    Quit date: 10/09/1974    Years since quitting: 44.2   Smokeless tobacco: Never Used  Substance and Sexual Activity   Alcohol use: Yes    Comment: glass of wine occasionally   Drug use: No   Sexual activity: Yes    Birth control/protection: Surgical  Lifestyle   Physical activity    Days per week: 0 days    Minutes per session: 0 min   Stress: To some extent  Relationships   Social connections    Talks on phone: Three times a week    Gets together: Twice a week    Attends religious service: More than 4 times per year    Active member of club or organization: No    Attends meetings of clubs or organizations: Never    Relationship status: Divorced  Other Topics Concern   Not on file  Social History Narrative   Not on file     Review of Systems: A 12 point ROS discussed and pertinent positives are indicated  in the HPI above.  All other systems are negative.  Review of Systems  Constitutional: Negative for fatigue and fever.  Respiratory: Negative for cough and shortness of breath.   Cardiovascular: Negative for chest pain.  Gastrointestinal: Positive for abdominal pain. Negative for diarrhea, nausea and vomiting.  Genitourinary: Negative for dysuria.  Musculoskeletal: Negative for back pain.  Neurological: Negative for dizziness and headaches.  Psychiatric/Behavioral: Negative for behavioral problems and confusion.    Vital Signs: BP (!) 172/67 (  BP Location: Right Arm) Comment: MD aware   Pulse 82    Temp 98.5 F (36.9 C) (Oral)    Resp 10    Ht 5\' 5"  (1.651 m)    Wt 148 lb 13 oz (67.5 kg)    SpO2 95%    BMI 24.76 kg/m   Physical Exam Vitals signs and nursing note reviewed.  Constitutional:      Appearance: Normal appearance.  HENT:     Mouth/Throat:     Mouth: Mucous membranes are moist.     Pharynx: Oropharynx is clear.  Cardiovascular:     Rate and Rhythm: Normal rate and regular rhythm.  Pulmonary:     Effort: Pulmonary effort is normal.     Breath sounds: Normal breath sounds.  Abdominal:     General: Abdomen is flat.     Tenderness: There is abdominal tenderness.     Comments: Hardened area within the abdominal wall throughout the RLQ.  Pelvis/groin soft. No obvious trauma to area.  Small amount of bruising to upper abdomen.   Skin:    General: Skin is warm and dry.  Neurological:     General: No focal deficit present.     Mental Status: She is alert and oriented to person, place, and time. Mental status is at baseline.  Psychiatric:        Mood and Affect: Mood normal.        Behavior: Behavior normal.        Thought Content: Thought content normal.        Judgment: Judgment normal.      MD Evaluation Airway: WNL Heart: WNL Abdomen: WNL Chest/ Lungs: WNL ASA  Classification: 3 Mallampati/Airway Score: One   Imaging: No results  found.  Labs:  CBC: Recent Labs    09/21/18 1342 12/14/18 2037 12/15/18 0234 12/15/18 0754  WBC 6.2 15.2* 14.9* 13.7*  HGB 11.6* 11.2* 9.7* 9.4*  HCT 35.4* 33.1* 27.9* 27.8*  PLT 329 317 279 279    COAGS: Recent Labs    09/21/18 1342 12/15/18 0845  INR 1.0 1.1  APTT 34  --     BMP: Recent Labs    03/21/18 1123 09/21/18 1341 09/21/18 1342 09/23/18 0629 12/15/18 0234  NA 131*  --  133* 132* 135  K 3.9  --  3.0* 3.0* 2.9*  CL 89*  --  96* 95* 98  CO2 27  --  26 28 27   GLUCOSE 115*  --  126* 90 111*  BUN 19  --  23 15 16   CALCIUM 9.2  --  9.0 8.5* 8.0*  CREATININE 1.51* 1.50* 1.46* 1.33* 1.45*  GFRNONAA 33*  --  34* 38* 34*  GFRAA 38*  --  40* 44* 40*    LIVER FUNCTION TESTS: Recent Labs    03/21/18 1123 09/21/18 1342 12/15/18 0234  BILITOT 0.3 0.7 0.8  AST 23 37 18  ALT 14 21 21   ALKPHOS 87 66 57  PROT 6.4 6.7 4.8*  ALBUMIN 4.2 3.8 2.7*    TUMOR MARKERS: No results for input(s): AFPTM, CEA, CA199, CHROMGRNA in the last 8760 hours.  Assessment and Plan: Rectus sheath hematoma Patient transferred from [facility] for possible embolization due to active extravasation on CT yesterday.  Patient s/Hughes 2u PRBC with improvement in HgB to 9.4. Plavix held.  Assessed this AM and found to be stable.   She does have abdominal tenderness which is expected.  Case reviewed by Dr. Vernard Gambles.  Given current  stability, hold on intervention at this time.  If further s/s of bleeding, will re-evaluate.  Patient aware and agreeable.   Thank you for this interesting consult.  I greatly enjoyed meeting Pamela Hughes Lupa and look forward to participating in their care.  A copy of this report was sent to the requesting provider on this date.  Electronically Signed: Hoyt KochKacie Sue-Ellen Hutson Luft, PA 12/15/2018, 1:03 PM   I spent a total of 40 Minutes    in face to face in clinical consultation, greater than 50% of which was counseling/coordinating care for rectus sheath  hematoma.

## 2018-12-15 NOTE — Progress Notes (Signed)
Pt's BP noted to be 165/54 (84), and HR 112. MD notified. Pt lying comfortably in bed showing no signs of distress. RN to continue to monitor.

## 2018-12-15 NOTE — Progress Notes (Signed)
PROGRESS NOTE    Pamela CommentDoris P Worster  ZOX:[id number] DOB: 11/30/1940 DOA: 12/14/2018 PCP: Dettinger, Elige RadonJoshua A, MD     Brief Narrative:  Pamela Hughes is a 78 y.o. female with medical history significant of hypertension, collagenous colitis, asthma, depression, previous CVA on Plavix, osteoarthritis and COPD who was [facility] with shortness of breath suspected to have COVID-19.  Patient was admitted and evaluated. Also treated for COPD exacerbation with inhalers and steroids.  Patient reported having a fall in her bathtub prior to going to the hospital.  She bruised her right arm.  X-rays of her right extremities and the joints were all normal.  Patient did not report hitting her abdomen or head at the time.  Two days after admission she complained of abdominal pain which was excruciating.  CT abdomen pelvis was done that showed retroperitoneal bleed mainly rectus sheath.  Subsequent bleeding scan showed inferior epigastric artery leak.  Patient was therefore transferred here for IR to intervene.  Patient has had protamine as well as 2 units of packed red blood cells transfused.  She was hemodynamically stable prior to transfer.   New events last 24 hours / Subjective: No new complaints this morning, continues to have right lower abdominal wall pain   Assessment & Plan:   Principal Problem:   Retroperitoneal bleed Active Problems:   Hypertension   Depression   Arthritis   Status post CVA   Fall at home, initial encounter   Acute posthemorrhagic anemia   Retroperitoneal bleed after fall at home -CT revealed intraperitoneal bleed as well as inferior epigastric artery leak -Hold Plavix, Lovenox and NSAIDs -Received protamine, packed red blood cells [facility] -Trend CBC -IR consulted for embolization  Fall at home  -PT OT to evaluate  COPD with exacerbation -Doing much better, no wheezes on examination -Continue breathing treatments   Essential hypertension -Resume amlodipine due to elevated blood pressure this morning, monitor closely  Hypokalemia -Replace, trend  CKD stage III -Baseline creatinine 1.4-1.5.  Stable  Depression -Continue Wellbutrin  Hyperlipidemia -Continue Crestor   DVT prophylaxis: SCD Code Status: Full code Family Communication: None Disposition Plan: Pending stabilization of retroperitoneal bleed, IR consultation   Consultants:   IR  Procedures:   None  Antimicrobials:  Anti-infectives (From admission, onward)   None        Objective: Vitals:   12/14/18 1923 12/15/18 0555 12/15/18 0804 12/15/18 1125  BP: (!) 161/57  (!) 179/60 (!) 172/67  Pulse: 80  82 82  Resp: 19   10  Temp: 97.9 F (36.6 C)  98.5 F (36.9 C)   TempSrc: Oral  Oral   SpO2: 97%  95% 95%  Weight:  67.5 kg    Height:  5\' 5"  (1.651 m)      Intake/Output Summary (Last 24 hours) at 12/15/2018 1250 Last data filed at 12/15/2018 0500 Gross per 24 hour  Intake 534.72 ml  Output -  Net 534.72 ml   Filed Weights   12/15/18 0555  Weight: 67.5 kg    Examination:  General exam: Appears calm and comfortable  Respiratory system: Clear to auscultation. Respiratory effort normal. Cardiovascular system: S1 & S2 heard, RRR. No JVD, murmurs, rubs, gallops or clicks. No pedal edema. Gastrointestinal system: Abdomen is nondistended, soft and tender to palpation right lower abdominal wall Central nervous system: Alert and oriented. No focal neurological deficits. Extremities: Symmetric 5 x 5 power. Skin: No rashes, lesions or ulcers Psychiatry:  Judgement and insight appear normal. Mood & affect appropriate.   Data Reviewed: I have personally reviewed following labs and imaging studies  CBC: Recent Labs  Lab 12/14/18 2037 12/15/18 0234 12/15/18 0754  WBC 15.2* 14.9* 13.7*  NEUTROABS 12.5* 10.7* 9.5*  HGB 11.2* 9.7* 9.4*  HCT 33.1* 27.9* 27.8*  MCV 90.2 89.1 90.0  PLT 317 279 315   Basic  Metabolic Panel: Recent Labs  Lab 12/15/18 0234  NA 135  K 2.9*  CL 98  CO2 27  GLUCOSE 111*  BUN 16  CREATININE 1.45*  CALCIUM 8.0*   GFR: Estimated Creatinine Clearance: 28.8 mL/min (A) (by C-G formula based on SCr of 1.45 mg/dL (H)). Liver Function Tests: Recent Labs  Lab 12/15/18 0234  AST 18  ALT 21  ALKPHOS 57  BILITOT 0.8  PROT 4.8*  ALBUMIN 2.7*   No results for input(s): LIPASE, AMYLASE in the last 168 hours. No results for input(s): AMMONIA in the last 168 hours. Coagulation Profile: Recent Labs  Lab 12/15/18 0845  INR 1.1   Cardiac Enzymes: No results for input(s): CKTOTAL, CKMB, CKMBINDEX, TROPONINI in the last 168 hours. BNP (last 3 results) No results for input(s): PROBNP in the last 8760 hours. HbA1C: No results for input(s): HGBA1C in the last 72 hours. CBG: No results for input(s): GLUCAP in the last 168 hours. Lipid Profile: No results for input(s): CHOL, HDL, LDLCALC, TRIG, CHOLHDL, LDLDIRECT in the last 72 hours. Thyroid Function Tests: No results for input(s): TSH, T4TOTAL, FREET4, T3FREE, THYROIDAB in the last 72 hours. Anemia Panel: No results for input(s): VITAMINB12, FOLATE, FERRITIN, TIBC, IRON, RETICCTPCT in the last 72 hours. Sepsis Labs: No results for input(s): PROCALCITON, LATICACIDVEN in the last 168 hours.  No results found for this or any previous visit (from the past 240 hour(s)).    Radiology Studies: No results found.    Scheduled Meds: . sodium chloride   Intravenous Once  . amLODipine  5 mg Oral Daily  . buPROPion  150 mg Oral BID  . rosuvastatin  20 mg Oral q1800  . traZODone  150 mg Oral QHS   Continuous Infusions: . sodium chloride 75 mL/hr at 12/15/18 0920     LOS: 1 day     Time spent: 35 minutes   Dessa Phi, DO [facility] [email or web address] 12/15/2018, 12:50 PM

## 2018-12-15 NOTE — Progress Notes (Signed)
Blount, NP notified by this nurse that patient had a drop in hbg from 11.2 to 9.7 in 6 hrs and pt is positive for a retroperitoneal bleed.

## 2018-12-16 LAB — CBC WITH DIFFERENTIAL/PLATELET
Abs Immature Granulocytes: 0.09 10*3/uL — ABNORMAL HIGH (ref 0.00–0.07)
Abs Immature Granulocytes: 0.08 10*3/uL — ABNORMAL HIGH (ref 0.00–0.07)
Abs Immature Granulocytes: 0.06 10*3/uL (ref 0.00–0.07)
Abs Immature Granulocytes: 0.09 10*3/uL — ABNORMAL HIGH (ref 0.00–0.07)
Basophils Absolute: 0 10*3/uL (ref 0.0–0.1)
Basophils Absolute: 0 10*3/uL (ref 0.0–0.1)
Basophils Absolute: 0 10*3/uL (ref 0.0–0.1)
Basophils Absolute: 0 10*3/uL (ref 0.0–0.1)
Basophils Relative: 0 %
Basophils Relative: 0 %
Basophils Relative: 0 %
Basophils Relative: 0 %
Eosinophils Absolute: 0.1 10*3/uL (ref 0.0–0.5)
Eosinophils Absolute: 0.1 10*3/uL (ref 0.0–0.5)
Eosinophils Absolute: 0.2 10*3/uL (ref 0.0–0.5)
Eosinophils Absolute: 0.4 10*3/uL (ref 0.0–0.5)
Eosinophils Relative: 0 %
Eosinophils Relative: 1 %
Eosinophils Relative: 1 %
Eosinophils Relative: 3 %
HCT: 25.4 % — ABNORMAL LOW (ref 36.0–46.0)
HCT: 26.5 % — ABNORMAL LOW (ref 36.0–46.0)
HCT: 26.4 % — ABNORMAL LOW (ref 36.0–46.0)
HCT: 26.1 % — ABNORMAL LOW (ref 36.0–46.0)
Hemoglobin: 8.7 g/dL — ABNORMAL LOW (ref 12.0–15.0)
Hemoglobin: 8.8 g/dL — ABNORMAL LOW (ref 12.0–15.0)
Hemoglobin: 8.8 g/dL — ABNORMAL LOW (ref 12.0–15.0)
Hemoglobin: 8.9 g/dL — ABNORMAL LOW (ref 12.0–15.0)
Immature Granulocytes: 1 %
Immature Granulocytes: 1 %
Immature Granulocytes: 1 %
Immature Granulocytes: 1 %
Lymphocytes Relative: 15 %
Lymphocytes Relative: 15 %
Lymphocytes Relative: 15 %
Lymphocytes Relative: 14 %
Lymphs Abs: 2 10*3/uL (ref 0.7–4.0)
Lymphs Abs: 1.9 10*3/uL (ref 0.7–4.0)
Lymphs Abs: 1.9 10*3/uL (ref 0.7–4.0)
Lymphs Abs: 2 10*3/uL (ref 0.7–4.0)
MCH: 31 pg (ref 26.0–34.0)
MCH: 30.8 pg (ref 26.0–34.0)
MCH: 30.7 pg (ref 26.0–34.0)
MCH: 31.9 pg (ref 26.0–34.0)
MCHC: 34.3 g/dL (ref 30.0–36.0)
MCHC: 33.2 g/dL (ref 30.0–36.0)
MCHC: 33.3 g/dL (ref 30.0–36.0)
MCHC: 34.1 g/dL (ref 30.0–36.0)
MCV: 90.4 fL (ref 80.0–100.0)
MCV: 92.7 fL (ref 80.0–100.0)
MCV: 92 fL (ref 80.0–100.0)
MCV: 93.5 fL (ref 80.0–100.0)
Monocytes Absolute: 1.9 10*3/uL — ABNORMAL HIGH (ref 0.1–1.0)
Monocytes Absolute: 2 10*3/uL — ABNORMAL HIGH (ref 0.1–1.0)
Monocytes Absolute: 1.4 10*3/uL — ABNORMAL HIGH (ref 0.1–1.0)
Monocytes Absolute: 2 10*3/uL — ABNORMAL HIGH (ref 0.1–1.0)
Monocytes Relative: 14 %
Monocytes Relative: 15 %
Monocytes Relative: 10 %
Monocytes Relative: 14 %
Neutro Abs: 9.5 10*3/uL — ABNORMAL HIGH (ref 1.7–7.7)
Neutro Abs: 9.2 10*3/uL — ABNORMAL HIGH (ref 1.7–7.7)
Neutro Abs: 9.5 10*3/uL — ABNORMAL HIGH (ref 1.7–7.7)
Neutro Abs: 9.5 10*3/uL — ABNORMAL HIGH (ref 1.7–7.7)
Neutrophils Relative %: 70 %
Neutrophils Relative %: 68 %
Neutrophils Relative %: 73 %
Neutrophils Relative %: 68 %
Platelets: 265 10*3/uL (ref 150–400)
Platelets: 268 10*3/uL (ref 150–400)
Platelets: 257 10*3/uL (ref 150–400)
Platelets: 297 10*3/uL (ref 150–400)
RBC: 2.81 MIL/uL — ABNORMAL LOW (ref 3.87–5.11)
RBC: 2.86 MIL/uL — ABNORMAL LOW (ref 3.87–5.11)
RBC: 2.87 MIL/uL — ABNORMAL LOW (ref 3.87–5.11)
RBC: 2.79 MIL/uL — ABNORMAL LOW (ref 3.87–5.11)
RDW: 14.1 % (ref 11.5–15.5)
RDW: 14.1 % (ref 11.5–15.5)
RDW: 14.1 % (ref 11.5–15.5)
RDW: 14 % (ref 11.5–15.5)
WBC: 13.6 10*3/uL — ABNORMAL HIGH (ref 4.0–10.5)
WBC: 13.3 10*3/uL — ABNORMAL HIGH (ref 4.0–10.5)
WBC: 13 10*3/uL — ABNORMAL HIGH (ref 4.0–10.5)
WBC: 13.9 10*3/uL — ABNORMAL HIGH (ref 4.0–10.5)
nRBC: 0.1 % (ref 0.0–0.2)
nRBC: 0 % (ref 0.0–0.2)
nRBC: 0 % (ref 0.0–0.2)
nRBC: 0.1 % (ref 0.0–0.2)

## 2018-12-16 LAB — BASIC METABOLIC PANEL
Anion gap: 7 (ref 5–15)
BUN: 12 mg/dL (ref 8–23)
CO2: 29 mmol/L (ref 22–32)
Calcium: 8.2 mg/dL — ABNORMAL LOW (ref 8.9–10.3)
Chloride: 101 mmol/L (ref 98–111)
Creatinine, Ser: 1.15 mg/dL — ABNORMAL HIGH (ref 0.44–1.00)
GFR calc Af Amer: 53 mL/min — ABNORMAL LOW (ref >60)
GFR calc non Af Amer: 46 mL/min — ABNORMAL LOW (ref >60)
Glucose, Bld: 111 mg/dL — ABNORMAL HIGH (ref 70–99)
Potassium: 3.4 mmol/L — ABNORMAL LOW (ref 3.5–5.1)
Sodium: 137 mmol/L (ref 135–145)

## 2018-12-16 LAB — MAGNESIUM: Magnesium: 1.9 mg/dL (ref 1.7–2.4)

## 2018-12-16 MED ORDER — GLYCERIN (LAXATIVE) 2.1 G RE SUPP
1.0000 | Freq: Every day | RECTAL | Status: DC | PRN
  Administered 2018-12-17: 10:00:00 1 via RECTAL
  Filled 2018-12-16 (×2): qty 1

## 2018-12-16 MED ORDER — POTASSIUM CHLORIDE CRYS ER 20 MEQ PO TBCR
40.0000 meq | EXTENDED_RELEASE_TABLET | Freq: Once | ORAL | Status: AC
  Administered 2018-12-16: 40 meq via ORAL
  Filled 2018-12-16: qty 2

## 2018-12-16 MED ORDER — METOPROLOL SUCCINATE ER 25 MG PO TB24
25.0000 mg | ORAL_TABLET | Freq: Every day | ORAL | Status: DC
  Administered 2018-12-16 – 2018-12-17 (×2): 25 mg via ORAL
  Filled 2018-12-16 (×2): qty 1

## 2018-12-16 MED ORDER — POLYETHYLENE GLYCOL 3350 17 G PO PACK
17.0000 g | PACK | Freq: Every day | ORAL | Status: DC
  Administered 2018-12-16 – 2018-12-17 (×2): 17 g via ORAL
  Filled 2018-12-16 (×2): qty 1

## 2018-12-16 MED ORDER — SENNOSIDES-DOCUSATE SODIUM 8.6-50 MG PO TABS
1.0000 | ORAL_TABLET | Freq: Every day | ORAL | Status: DC
  Administered 2018-12-16: 1 via ORAL
  Filled 2018-12-16: qty 1

## 2018-12-16 NOTE — Evaluation (Signed)
Occupational Therapy Evaluation Patient Details Name: Pamela Hughes MRN: [id number] DOB: 11/21/1940 Today's Date: 12/16/2018    History of Present Illness Pt is a 78 yo female Patient reported having a fall in her bathtub prior to going to the hospital.  She bruised her right arm.  X-rays of her right extremities and the joints were all normal.  Two days after admission she complained of abdominal pain which was excruciating.  CT abdomen pelvis was done that showed retroperitoneal bleed mainly rectus sheath.  Subsequent bleeding scan showed inferior epigastric artery leak.  Patient was therefore transferred here for IR to intervene.  Patient has had protamine as well as 2 units of packed red blood cells transfused. PMHx: significant of hypertension, collagenous colitis, asthma, depression, previous CVA on Plavix, osteoarthritis and COPD.   Clinical Impression   Pt PTA: living alone and reports independence with ADL and mobility. Pt currently, performing ambulation/transfers with minguardA and RW. Pt minA overall for ADL requiring rest breaks in seated position. Pt limited by abdominal pain and overall fatigue and weakness. Pt would benefit from continued OT skilled services for ADL,mobility and safety. HHOT recommended. Pt reports her daughter will be staying with her for a while after D/C. OT following acutely.     Follow Up Recommendations  Home health OT;Supervision - Intermittent    Equipment Recommendations  3 in 1 bedside commode;Other (comment)(RW)    Recommendations for Other Services       Precautions / Restrictions Precautions Precautions: Fall Restrictions Weight Bearing Restrictions: No      Mobility Bed Mobility Overal bed mobility: Needs Assistance Bed Mobility: Rolling;Sidelying to Sit Rolling: Supervision Sidelying to sit: Supervision       General bed mobility comments: HOB elevated with log rolling technique  Transfers Overall transfer level: Needs  assistance Equipment used: Rolling walker (2 wheeled) Transfers: Sit to/from Stand Sit to Stand: Min guard         General transfer comment: minguardA for safety with initial standing balance    Balance Overall balance assessment: Needs assistance Sitting-balance support: No upper extremity supported;Feet supported Sitting balance-Leahy Scale: Good     Standing balance support: Bilateral upper extremity supported Standing balance-Leahy Scale: Fair                             ADL either performed or assessed with clinical judgement   ADL Overall ADL's : Needs assistance/impaired Eating/Feeding: Set up;Sitting   Grooming: Set up;Sitting   Upper Body Bathing: Set up;Sitting   Lower Body Bathing: Min guard;Sitting/lateral leans;Sit to/from stand   Upper Body Dressing : Set up;Sitting   Lower Body Dressing: Min guard;Sitting/lateral leans;Sit to/from stand   Toilet Transfer: Min guard;Ambulation;Regular Social workerToilet   Toileting- Clothing Manipulation and Hygiene: Min guard;Sitting/lateral lean;Sit to/from stand;Cueing for safety Toileting - Clothing Manipulation Details (indicate cue type and reason): standing for task x2 times     Functional mobility during ADLs: Min guard;Rolling walker;Cueing for safety General ADL Comments: Pt limited by abdomen pain. Pt with fair balance with mobility, but often states "I feel so weak."      Vision Baseline Vision/History: No visual deficits Vision Assessment?: No apparent visual deficits     Perception     Praxis      Pertinent Vitals/Pain Pain Assessment: No/denies pain     Hand Dominance Right   Extremity/Trunk Assessment Upper Extremity Assessment Upper Extremity Assessment: Generalized weakness   Lower Extremity Assessment Lower Extremity  Assessment: Defer to PT evaluation;Generalized weakness   Cervical / Trunk Assessment Cervical / Trunk Assessment: Normal   Communication Communication Communication:  No difficulties   Cognition Arousal/Alertness: Awake/alert Behavior During Therapy: WFL for tasks assessed/performed Overall Cognitive Status: Within Functional Limits for tasks assessed                                 General Comments: Short BLessed Test: 2/28 Missing only counting backwards from 4, 2, 3,1. Recall intact immediately, after 3 mins and after 10 mins   General Comments  pt reports it has been a week since a BM.    Exercises     Shoulder Instructions      Home Living Family/patient expects to be discharged to:: Private residence Living Arrangements: Alone Available Help at Discharge: Family;Available 24 hours/day Type of Home: House Home Access: Stairs to enter [employer] of Steps: 4 Entrance Stairs-Rails: Right;Left;Can reach both Home Layout: One level     Bathroom Shower/Tub: Teacher, early years/pre: Standard     Home Equipment: Grab bars - tub/shower;Cane - single point   Additional Comments: daughter is staying with pt 24/7 for a week      Prior Functioning/Environment Level of Independence: Independent                 OT Problem List: Decreased strength;Decreased activity tolerance;Impaired balance (sitting and/or standing);Decreased safety awareness;Pain      OT Treatment/Interventions: Self-care/ADL training;Therapeutic exercise;Neuromuscular education;Therapeutic activities;Patient/family education;Balance training;Energy conservation    OT Goals(Current goals can be found in the care plan section) Acute Rehab OT Goals Patient Stated Goal: to go home feeling better OT Goal Formulation: With patient Time For Goal Achievement: 12/29/18 Potential to Achieve Goals: Good ADL Goals Pt Will Perform Grooming: with modified independence;standing Pt Will Perform Lower Body Dressing: with modified independence;with adaptive equipment;sitting/lateral leans;sit to/from stand Pt Will Perform Tub/Shower Transfer:  with supervision;ambulating Additional ADL Goal #1: Pt will perform OOB ADL with fair balance and x10 mins standing with supervisionA.  OT Frequency: Min 2X/week   Barriers to D/C:            Co-evaluation              AM-PAC OT "6 Clicks" Daily Activity     Outcome Measure Help from another person eating meals?: None Help from another person taking care of personal grooming?: None Help from another person toileting, which includes using toliet, bedpan, or urinal?: A Little Help from another person bathing (including washing, rinsing, drying)?: A Little Help from another person to put on and taking off regular upper body clothing?: None Help from another person to put on and taking off regular lower body clothing?: A Little 6 Click Score: 21   End of Session Equipment Utilized During Treatment: Gait belt;Rolling walker Nurse Communication: Mobility status  Activity Tolerance: Patient tolerated treatment well Patient left: in chair;with call bell/phone within reach;with chair alarm set  OT Visit Diagnosis: Unsteadiness on feet (R26.81);Muscle weakness (generalized) (M62.81);Pain Pain - part of body: (stomach)                Time: 1357-1430 OT Time Calculation (min): 33 min Charges:  OT General Charges $OT Visit: 1 Visit OT Evaluation $OT Eval Moderate Complexity: 1 Mod OT Treatments $Self Care/Home Management : 8-22 mins  Ebony Hail Harold Hedge) Marsa Aris OTR/L Acute Rehabilitation Services Pager: [phone] Office: [place] 12/16/2018,  3:06 PM

## 2018-12-16 NOTE — Progress Notes (Signed)
PROGRESS NOTE    Pamela Hughes  ZOX:[id number] DOB: [birth year] DOA: 12/14/2018 PCP: Dettinger, Elige RadonJoshua A, MD     Brief Narrative:  Pamela CommentDoris P Hughes is a 78 y.o. female with medical history significant of hypertension, collagenous colitis, asthma, depression, previous CVA on Plavix, osteoarthritis and COPD who [facility] with shortness of breath suspected to have COVID-19.  Patient was admitted and evaluated. Also treated for COPD exacerbation with inhalers and steroids.  Patient reported having a fall in her bathtub prior to going to the hospital.  She bruised her right arm.  X-rays of her right extremities and the joints were all normal.  Patient did not report hitting her abdomen or head at the time.  Two days after admission she complained of abdominal pain which was excruciating.  CT abdomen pelvis was done that showed retroperitoneal bleed mainly rectus sheath.  Subsequent bleeding scan showed inferior epigastric artery leak.  Patient was therefore transferred here for IR to intervene.  Patient has had protamine as well as 2 units of packed red blood cells transfused.  She was hemodynamically stable prior to transfer.   New events last 24 hours / Subjective: Main complaint is constipation, no dizziness or lightheadedness.  Some abdominal wall tenderness as well, similar to yesterday  Assessment & Plan:   Principal Problem:   Retroperitoneal bleed Active Problems:   Hypertension   Depression   Arthritis   Status post CVA   Fall at home, initial encounter   Acute posthemorrhagic anemia   Retroperitoneal bleed after fall at home -CT revealed intraperitoneal bleed as well as inferior epigastric artery leak -Hold Plavix, Lovenox and NSAIDs -Received protamine, 2u packed red blood cells [facility] -IR consulted, following for signs and symptoms of bleeding, given current stability will hold off on intervention at this time -Trend CBC,  hemoglobin 8.8 this morning  Fall at home  -PT OT to evaluate  COPD with exacerbation -Doing much better, no wheezes on examination -Continue breathing treatments  Essential hypertension -Resume amlodipine, metoprolol  Hypokalemia -Replace, trend  CKD stage III -Baseline creatinine 1.4-1.5.  Stable  Depression -Continue Wellbutrin  Hyperlipidemia -Continue Crestor  Hx right PCA stroke  -On plavix as outpatient, on hold currently    DVT prophylaxis: SCD Code Status: Full code Family Communication: Spoke with daughter over the phone Disposition Plan: Pending stabilization of retroperitoneal bleed, PT OT to evaluate   Consultants:   IR  Procedures:   None  Antimicrobials:  Anti-infectives (From admission, onward)   None       Objective: Vitals:   12/16/18 0130 12/16/18 0542 12/16/18 0550 12/16/18 0800  BP:   (!) 149/49 (!) 142/60  Pulse:   82 91  Resp:   17 18  Temp: 99.3 F (37.4 C) 98.7 F (37.1 C)  98.2 F (36.8 C)  TempSrc: Oral Oral  Oral  SpO2:   94% 98%  Weight:      Height:        Intake/Output Summary (Last 24 hours) at 12/16/2018 0949 Last data filed at 12/16/2018 0117 Gross per 24 hour  Intake 502.56 ml  Output 1100 ml  Net -597.44 ml   Filed Weights   12/15/18 0555  Weight: 67.5 kg    Examination: General exam: Appears calm and comfortable  Respiratory system: Clear to auscultation. Respiratory effort normal. Cardiovascular system: S1 & S2 heard, RRR. No JVD, murmurs, rubs, gallops or clicks. No pedal edema. Gastrointestinal system:  Abdomen is nondistended, soft and nontender. No organomegaly or masses felt. Normal bowel sounds heard. Central nervous system: Alert and oriented. No focal neurological deficits. Extremities: Symmetric 5 x 5 power. Skin: No rashes, lesions or ulcers Psychiatry: Judgement and insight appear normal. Mood & affect appropriate.    Data Reviewed: I have personally reviewed following labs and  imaging studies  CBC: Recent Labs  Lab 12/15/18 0754 12/15/18 1500 12/15/18 2003 12/16/18 0251 12/16/18 0746  WBC 13.7* 13.3* 14.8* 13.6* 13.3*  NEUTROABS 9.5* 9.4* 10.9* 9.5* 9.2*  HGB 9.4* 9.0* 9.4* 8.7* 8.8*  HCT 27.8* 26.5* 28.2* 25.4* 26.5*  MCV 90.0 88.9 91.0 90.4 92.7  PLT 279 255 300 265 426   Basic Metabolic Panel: Recent Labs  Lab 12/15/18 0234 12/16/18 0251  NA 135 137  K 2.9* 3.4*  CL 98 101  CO2 27 29  GLUCOSE 111* 111*  BUN 16 12  CREATININE 1.45* 1.15*  CALCIUM 8.0* 8.2*  MG  --  1.9   GFR: Estimated Creatinine Clearance: 36.3 mL/min (A) (by C-G formula based on SCr of 1.15 mg/dL (H)). Liver Function Tests: Recent Labs  Lab 12/15/18 0234  AST 18  ALT 21  ALKPHOS 57  BILITOT 0.8  PROT 4.8*  ALBUMIN 2.7*   No results for input(s): LIPASE, AMYLASE in the last 168 hours. No results for input(s): AMMONIA in the last 168 hours. Coagulation Profile: Recent Labs  Lab 12/15/18 0845  INR 1.1   Cardiac Enzymes: No results for input(s): CKTOTAL, CKMB, CKMBINDEX, TROPONINI in the last 168 hours. BNP (last 3 results) No results for input(s): PROBNP in the last 8760 hours. HbA1C: No results for input(s): HGBA1C in the last 72 hours. CBG: No results for input(s): GLUCAP in the last 168 hours. Lipid Profile: No results for input(s): CHOL, HDL, LDLCALC, TRIG, CHOLHDL, LDLDIRECT in the last 72 hours. Thyroid Function Tests: No results for input(s): TSH, T4TOTAL, FREET4, T3FREE, THYROIDAB in the last 72 hours. Anemia Panel: No results for input(s): VITAMINB12, FOLATE, FERRITIN, TIBC, IRON, RETICCTPCT in the last 72 hours. Sepsis Labs: No results for input(s): PROCALCITON, LATICACIDVEN in the last 168 hours.  No results found for this or any previous visit (from the past 240 hour(s)).    Radiology Studies: No results found.    Scheduled Meds: . amLODipine  5 mg Oral Daily  . buPROPion  150 mg Oral BID  . metoprolol succinate  25 mg Oral Daily   . polyethylene glycol  17 g Oral Daily  . rosuvastatin  20 mg Oral q1800  . senna-docusate  1 tablet Oral QHS  . traZODone  150 mg Oral QHS   Continuous Infusions:    LOS: 2 days     Time spent: 25 minutes   Dessa Phi, DO [facility] [email or web address] 12/16/2018, 9:49 AM

## 2018-12-16 NOTE — Progress Notes (Signed)
Patient ID: Pamela Hughes, female   DOB: 23-Jan-1941, 78 y.o.   MRN: [id number]   IR aware of pt Rectus sheath hematoma See IR note yesterday  Hg this am 8.8- stable  Pt comfortable per notes  Please call IR if any needs

## 2018-12-16 NOTE — Progress Notes (Signed)
Spoke with the patients daughter, and provided and update on the patients status. RN to continue to monitor.

## 2018-12-17 LAB — CBC WITH DIFFERENTIAL/PLATELET
Abs Immature Granulocytes: 0.08 10*3/uL — ABNORMAL HIGH (ref 0.00–0.07)
Abs Immature Granulocytes: 0.07 10*3/uL (ref 0.00–0.07)
Basophils Absolute: 0 10*3/uL (ref 0.0–0.1)
Basophils Absolute: 0 10*3/uL (ref 0.0–0.1)
Basophils Relative: 0 %
Basophils Relative: 0 %
Eosinophils Absolute: 0.4 10*3/uL (ref 0.0–0.5)
Eosinophils Absolute: 0.4 10*3/uL (ref 0.0–0.5)
Eosinophils Relative: 3 %
Eosinophils Relative: 3 %
HCT: 25.7 % — ABNORMAL LOW (ref 36.0–46.0)
HCT: 25.9 % — ABNORMAL LOW (ref 36.0–46.0)
Hemoglobin: 8.5 g/dL — ABNORMAL LOW (ref 12.0–15.0)
Hemoglobin: 8.7 g/dL — ABNORMAL LOW (ref 12.0–15.0)
Immature Granulocytes: 1 %
Immature Granulocytes: 1 %
Lymphocytes Relative: 17 %
Lymphocytes Relative: 12 %
Lymphs Abs: 2.5 10*3/uL (ref 0.7–4.0)
Lymphs Abs: 1.5 10*3/uL (ref 0.7–4.0)
MCH: 30.2 pg (ref 26.0–34.0)
MCH: 31.4 pg (ref 26.0–34.0)
MCHC: 33.1 g/dL (ref 30.0–36.0)
MCHC: 33.6 g/dL (ref 30.0–36.0)
MCV: 91.5 fL (ref 80.0–100.0)
MCV: 93.5 fL (ref 80.0–100.0)
Monocytes Absolute: 1.7 10*3/uL — ABNORMAL HIGH (ref 0.1–1.0)
Monocytes Absolute: 1.4 10*3/uL — ABNORMAL HIGH (ref 0.1–1.0)
Monocytes Relative: 12 %
Monocytes Relative: 11 %
Neutro Abs: 9.5 10*3/uL — ABNORMAL HIGH (ref 1.7–7.7)
Neutro Abs: 9 10*3/uL — ABNORMAL HIGH (ref 1.7–7.7)
Neutrophils Relative %: 67 %
Neutrophils Relative %: 73 %
Platelets: 294 10*3/uL (ref 150–400)
Platelets: 291 10*3/uL (ref 150–400)
RBC: 2.81 MIL/uL — ABNORMAL LOW (ref 3.87–5.11)
RBC: 2.77 MIL/uL — ABNORMAL LOW (ref 3.87–5.11)
RDW: 14 % (ref 11.5–15.5)
RDW: 14 % (ref 11.5–15.5)
WBC: 14.2 10*3/uL — ABNORMAL HIGH (ref 4.0–10.5)
WBC: 12.4 10*3/uL — ABNORMAL HIGH (ref 4.0–10.5)
nRBC: 0.1 % (ref 0.0–0.2)
nRBC: 0 % (ref 0.0–0.2)

## 2018-12-17 LAB — BASIC METABOLIC PANEL
Anion gap: 9 (ref 5–15)
BUN: 14 mg/dL (ref 8–23)
CO2: 29 mmol/L (ref 22–32)
Calcium: 8.6 mg/dL — ABNORMAL LOW (ref 8.9–10.3)
Chloride: 100 mmol/L (ref 98–111)
Creatinine, Ser: 1.14 mg/dL — ABNORMAL HIGH (ref 0.44–1.00)
GFR calc Af Amer: 53 mL/min — ABNORMAL LOW (ref >60)
GFR calc non Af Amer: 46 mL/min — ABNORMAL LOW (ref >60)
Glucose, Bld: 119 mg/dL — ABNORMAL HIGH (ref 70–99)
Potassium: 4.5 mmol/L (ref 3.5–5.1)
Sodium: 138 mmol/L (ref 135–145)

## 2018-12-17 NOTE — Evaluation (Signed)
Physical Therapy Evaluation Patient Details Name: Pamela Hughes MRN: [id number] DOB: 06/12/1940 Today's Date: 12/17/2018   History of Present Illness  Pt is a 78 yo female Patient reported having a fall in her bathtub prior to going to the hospital.  She bruised her right arm.  X-rays of her right extremities and the joints were all normal.  Two days after admission she complained of abdominal pain which was excruciating.  CT abdomen pelvis was done that showed retroperitoneal bleed mainly rectus sheath.  Subsequent bleeding scan showed inferior epigastric artery leak.  Patient was therefore transferred here for IR to intervene.  Patient has had protamine as well as 2 units of packed red blood cells transfused. PMHx: significant of hypertension, collagenous colitis, asthma, depression, previous CVA on Plavix, osteoarthritis and COPD who was [facility] with shortness of breath suspected to have COVID-19.    Clinical Impression  Patient is a 78 y/o female presenting with the above. Patient reports independence at baseline for mobility and ADLs. Patient today requiring light Min A to sit EOB with patient requiring use of RW for mobility due to onset of L Knee pain. Patient requiring cueing for safety with device with close min guard. Will recommend RW and 3in1 DME at discharge along with HHPT. PT to follow acutely.      Follow Up Recommendations Home health PT;Supervision/Assistance - 24 hour    Equipment Recommendations  Rolling walker with 5" wheels;3in1 (PT)    Recommendations for Other Services       Precautions / Restrictions Precautions Precautions: Fall Restrictions Weight Bearing Restrictions: No      Mobility  Bed Mobility Overal bed mobility: Needs Assistance Bed Mobility: Rolling;Sidelying to Sit Rolling: Min guard Sidelying to sit: Min assist       General bed mobility comments: Min A to sit at EOB - used log rolling  Transfers Overall transfer level:  Needs assistance Equipment used: Rolling walker (2 wheeled) Transfers: Sit to/from Stand Sit to Stand: Min guard         General transfer comment: min guard for safety and immediate standing balance - attempted to stand wihtout device, however knee pain limiting  Ambulation/Gait Ambulation/Gait assistance: Min guard Gait Distance (Feet): 100 Feet Assistive device: Rolling walker (2 wheeled) Gait Pattern/deviations: Step-through pattern;Decreased stride length Gait velocity: decreased   General Gait Details: cueing for safety and proximity to device; L knee pain limiting  Stairs            Wheelchair Mobility    Modified Rankin (Stroke Patients Only)       Balance Overall balance assessment: Needs assistance Sitting-balance support: No upper extremity supported;Feet supported Sitting balance-Leahy Scale: Good     Standing balance support: Bilateral upper extremity supported;During functional activity Standing balance-Leahy Scale: Fair                               Pertinent Vitals/Pain Pain Assessment: Faces Faces Pain Scale: Hurts little more Pain Location: L knee Pain Descriptors / Indicators: Aching;Discomfort;Guarding Pain Intervention(s): Limited activity within patient's tolerance;Monitored during session;Repositioned    Home Living Family/patient expects to be discharged to:: Private residence Living Arrangements: Alone Available Help at Discharge: Family;Available 24 hours/day Type of Home: House Home Access: Stairs to enter Entrance Stairs-Rails: Right;Left;Can reach both Entrance Stairs-Number of Steps: 4 Home Layout: One level Home Equipment: Grab bars - tub/shower;Cane - single point Additional Comments: daughter is staying with  pt 24/7 for a week    Prior Function Level of Independence: Independent               Hand Dominance   Dominant Hand: Right    Extremity/Trunk Assessment   Upper Extremity Assessment Upper  Extremity Assessment: Defer to OT evaluation    Lower Extremity Assessment Lower Extremity Assessment: Generalized weakness    Cervical / Trunk Assessment Cervical / Trunk Assessment: Normal  Communication   Communication: No difficulties  Cognition Arousal/Alertness: Awake/alert Behavior During Therapy: WFL for tasks assessed/performed Overall Cognitive Status: Within Functional Limits for tasks assessed                                        General Comments General comments (skin integrity, edema, etc.): states she has not had a BM in a while and now with increased urinay frequency    Exercises     Assessment/Plan    PT Assessment Patient needs continued PT services  PT Problem List Decreased strength;Decreased activity tolerance;Decreased balance;Decreased mobility;Decreased knowledge of use of DME;Decreased safety awareness       PT Treatment Interventions DME instruction;Gait training;Stair training;Functional mobility training;Therapeutic activities;Therapeutic exercise;Balance training;Patient/family education    PT Goals (Current goals can be found in the Care Plan section)  Acute Rehab PT Goals Patient Stated Goal: to go home feeling better PT Goal Formulation: With patient Time For Goal Achievement: 12/31/18 Potential to Achieve Goals: Good    Frequency Min 3X/week   Barriers to discharge        Co-evaluation               AM-PAC PT "6 Clicks" Mobility  Outcome Measure Help needed turning from your back to your side while in a flat bed without using bedrails?: A Little Help needed moving from lying on your back to sitting on the side of a flat bed without using bedrails?: A Little Help needed moving to and from a bed to a chair (including a wheelchair)?: A Little Help needed standing up from a chair using your arms (e.g., wheelchair or bedside chair)?: A Little Help needed to walk in hospital room?: A Little Help needed climbing  3-5 steps with a railing? : A Lot 6 Click Score: 17    End of Session Equipment Utilized During Treatment: Gait belt Activity Tolerance: Patient tolerated treatment well Patient left: in chair;with call bell/phone within reach;with chair alarm set Nurse Communication: Mobility status PT Visit Diagnosis: Unsteadiness on feet (R26.81);Other abnormalities of gait and mobility (R26.89);Muscle weakness (generalized) (M62.81)    Time: 3299-2426 PT Time Calculation (min) (ACUTE ONLY): 24 min   Charges:   PT Evaluation $PT Eval Moderate Complexity: 1 Mod PT Treatments $Gait Training: 8-22 mins        Lanney Gins, PT, DPT Supplemental Physical Therapist 12/17/18 12:27 PM Pager: [phone] Office: [phone]

## 2018-12-17 NOTE — Discharge Instructions (Signed)
Retroperitoneal Bleeding Retroperitoneal bleeding happens when the blood vessels behind your abdomen bleed into the space between your abdomen and your back (retroperitoneal space). This space contains:  Your kidneys.  The glands that are on top of your kidneys (adrenal glands).  The tubes that drain urine from your kidneys (ureters).  The large blood vessel that carries blood to your lower body (aorta).  Some parts of your digestive tract. The bleeding can be slow or fast. Bleeding into the retroperitoneal space is a rare but life-threatening condition. Get help right away for any signs of retroperitoneal bleeding. What are the causes? This condition may be caused by:  Injury (trauma) to your pelvic area, abdomen, or back.  Surgery or procedures in the retroperitoneal space. Other causes include:  Tumors.  Swelling through a weakened wall of a blood vessel (aneurysm). This can rupture and cause bleeding.  Ovarian cysts.  Bleeding from your spleen.  Inflammation of the pancreas (pancreatitis). Sometimes, the cause is not known (idiopathic). What increases the risk? Retroperitoneal bleeding is more likely to develop in people:  Who take medicines to prevent blood clots (anticoagulants).  Have a blood clotting disorder.  Who have high blood pressure.  Who are on a machine that cleans blood because the kidneys have failed (dialysis). What are the signs or symptoms? Signs and symptoms usually occur suddenly and include:  Pain in the abdomen, back, groin or side (flank).  Pain when pressing on the abdomen, back, or flank.  Bruising in the abdomen, side or back.  Pain, numbness or tingling in your leg (neuropathy).  Blood in the urine.  Dizziness or lightheadedness from low blood pressure.  Rapid heartbeat (tachycardia).  Very low blood pressure, which can cause shock. Symptoms of shock include: ? Fainting. ? Trouble breathing. ? Cold, clammy skin. How is this  diagnosed? This condition may be diagnosed based on your symptoms and your medical history. It is important to find the cause of the bleeding. Your health care provider will do a physical exam. You may also have other tests, including:  X-rays of the abdomen to check for signs of bleeding or aneurysm.  Blood tests to check for low blood counts (anemia) or blood loss.  Imaging studies, such as: ? CT scan. You may have dye injected into a blood vessel to help locate the source of the bleeding (CT angiography). ? Ultrasound. ? MRI. How is this treated? The goal of treatment is to stop the bleeding. Treatment may include:  Giving you fluids through an IV to get your blood pressure back into a normal range.  Medicines or blood products to reverse anticoagulant medicines, if you take them.  Giving you blood from a donor (transfusion).  Observation to see if the bleeding stops on its own.  Blocking the bleeding with a small plug (embolization). This is done by placing a long tube (catheter) through your blood vessel and into the site of the bleeding.  Doing exploratory surgery to find the bleeding and stop it. This may be done using an operating scope (laparoscopy) or as an open surgery (laparotomy). Follow these instructions at home: Activity  Rest as told by your health care provider.  Do not participate in any activity that takes a lot of effort until your health care provider says that it is safe.  Do not lift anything that is heavier than 10 lb (4.5 kg), or the limit that you are told, until your health care provider says that it is safe. General  instructions  Take over-the-counter and prescription medicines only as told by your health care provider.  Do not use any products that contain nicotine or tobacco, such as cigarettes and e-cigarettes. These may delay healing after an injury. If you need help quitting, ask your health care provider.  Follow instructions from your health  care provider about eating or drinking restrictions.  Keep all follow-up visits as told by your health care provider. This is important. Contact a health care provider if you have: Get help right away if you have:  A fever.  New or worsening pain in your abdomen, back, or side (flank).  Chest pain.  Difficulty breathing.  Dizziness or passing out. Summary  Retroperitoneal bleeding happens when the blood vessels behind your abdomen bleed into the space between your abdomen and your back (retroperitoneal space).  Symptoms of this condition may include pain in the abdomen, back, or side, dizziness or light-headedness, a rapid heart rate, blood in the urine, or low blood pressure.  The goal of treatment is to stop the bleeding.  Rest as told by your health care provider.  Do not lift anything that is heavier than 10 lb (4.5 kg), or the limit that you are told, until your health care provider says that it is safe. This information is not intended to replace advice given to you by your health care provider. Make sure you discuss any questions you have with your health care provider. Document Released: 08/06/2004 Document Revised: 06/23/2017 Document Reviewed: 06/23/2017 Elsevier Patient Education  2020 [employer].

## 2018-12-17 NOTE — Discharge Summary (Addendum)
Physician Discharge Summary  Marin CommentDoris P Baugher ONG:[id number] DOB: 09/05/1940 DOA: 12/14/2018  PCP: Dettinger, Elige RadonJoshua A, MD  Admit date: 12/14/2018 Discharge date: 12/17/2018  Admitted From: Home Disposition: Home  Recommendations for Outpatient Follow-up:  1. Follow up with PCP in 1 week 2. Follow up with Neurology as scheduled (Patient's daughter will reschedule patient's appointment tomorrow).  Aspirin Plavix on hold due to retroperitoneal bleed, resume per further neurology recommendations and following repeat CBC. 3. Please obtain CBC in 1 week  4. Stop all NSAIDs, ibuprofen over-the-counter  Home Health: PT OT   Equipment/Devices: Bedside commode  Discharge Condition: Stable CODE STATUS: Full  Diet recommendation: Heart healthy   Brief/Interim Summary: Pamela P Millsis a 78 y.o.femalewith medical history significant ofhypertension, collagenous colitis, asthma, depression, previous CVA on Plavix, osteoarthritis and COPD [facility] Patient was admitted and evaluated.Also treated for COPD exacerbation with inhalers and steroids. Patient reported having a fall in her bathtub prior to going to the hospital. She bruised her right arm. X-rays of her right extremities and the joints were all normal. Patient did not report hitting her abdomen or head at the time. Two days after admission she complained of abdominal pain which was excruciating. CT abdomen pelvis was done that showed retroperitoneal bleed mainly rectus sheath. Subsequent bleeding scan showed inferior epigastric artery leak. Patient was therefore transferred here for IR to intervene. Patient has had protamine as well as 2 units of packed red blood cells transfused. She was hemodynamically stable prior to transfer.   Patient was evaluated by interventional radiology, due to her stability of bleeding, they elected to monitor patient.  She did not  have any further signs or symptoms of bleeding, hemoglobin remained stable off of aspirin and Plavix.  I discussed with patient and daughter regarding holding off on antiplatelet as this can increase risk of future stroke.  They need to follow-up closely with PCP and neurology for resumption of these medications.  Discharge Diagnoses:  Principal Problem:   Retroperitoneal bleed Active Problems:   Hypertension   Depression   Arthritis   Status post CVA   Fall at home, initial encounter   Acute posthemorrhagic anemia   Retroperitoneal bleed after fall at home -CT revealed intraperitoneal bleed as well as inferior epigastric artery leak -Hold aspirin, Plavix, Lovenox and NSAIDs -Received protamine, 2u packed red blood cells [facility] -IR consulted, following for signs and symptoms of bleeding, given current stability will hold off on intervention at this time -Trend CBC, hemoglobin 8.5 this morning  Fall at home  -PT OT recommending home health  COPD with exacerbation -Doing much better, no wheezes on examination -Continue breathing treatments  Essential hypertension -Resume amlodipine, metoprolol  CKD stage III -Baseline creatinine 1.4-1.5.  Stable  Depression -Continue Wellbutrin  Hyperlipidemia -Continue Crestor  Hx right PCA stroke  -On aspirin and Plavix as outpatient, on hold currently     Discharge Instructions  Discharge Instructions    Call MD for:  difficulty breathing, headache or visual disturbances   Complete by: As directed    Call MD for:  extreme fatigue   Complete by: As directed    Call MD for:  hives   Complete by: As directed    Call MD for:  persistant dizziness or light-headedness   Complete by: As directed    Call MD for:  persistant nausea and vomiting   Complete by: As directed  Call MD for:  severe uncontrolled pain   Complete by: As directed    Call MD for:  temperature >100.4   Complete by: As  directed    Diet - low sodium heart healthy   Complete by: As directed    Discharge instructions   Complete by: As directed    STOP ALL IBUPROFEN, NSAID OVER THE COUNTER.   HOLD ASPIRIN, PLAVIX UNTIL FOLLOW UP WITH PCP AND NEUROLOGY.   You were cared for by a hospitalist during your hospital stay. If you have any questions about your discharge medications or the care you received while you were in the hospital after you are discharged, you can call the unit and ask to speak with the hospitalist on call if the hospitalist that took care of you is not available. Once you are discharged, your primary care physician will handle any further medical issues. Please note that NO REFILLS for any discharge medications will be authorized once you are discharged, as it is imperative that you return to your primary care physician (or establish a relationship with a primary care physician if you do not have one) for your aftercare needs so that they can reassess your need for medications and monitor your lab values.   Increase activity slowly   Complete by: As directed      Allergies as of 12/17/2018   No Known Allergies     Medication List    STOP taking these medications   clopidogrel 75 MG tablet Commonly known as: PLAVIX   clotrimazole 10 MG troche Commonly known as: MYCELEX   meloxicam 7.5 MG tablet Commonly known as: MOBIC     TAKE these medications   albuterol 108 (90 Base) MCG/ACT inhaler Commonly known as: VENTOLIN HFA Inhale 2 puffs into the lungs every 6 (six) hours as needed for wheezing or shortness of breath.   amLODipine 5 MG tablet Commonly known as: NORVASC Take 1 tablet (5 mg total) by mouth daily.   buPROPion 150 MG 12 hr tablet Commonly known as: WELLBUTRIN SR Take 1 tablet (150 mg total) by mouth 2 (two) times daily. (Needs to be seen before next refill) What changed:   when to take this  additional instructions   chlorthalidone 25 MG tablet Commonly known as:  HYGROTON TAKE ONE (1) TABLET EACH DAY What changed: See the new instructions.   gabapentin 100 MG capsule Commonly known as: Neurontin Take 1 capsule (100 mg total) by mouth 3 (three) times daily. One three times daily   ipratropium 0.03 % nasal spray Commonly known as: ATROVENT USE TWICE DAILY AS DIRECTED What changed:   how much to take  how to take this  when to take this  additional instructions   meclizine 25 MG tablet Commonly known as: ANTIVERT TAKE ONE TABLET 3 TIMES A DAY AS NEEDED. What changed: See the new instructions.   metoprolol succinate 25 MG 24 hr tablet Commonly known as: TOPROL-XL Take 1 tablet (25 mg total) by mouth daily.   omeprazole 20 MG capsule Commonly known as: PRILOSEC Take 1 capsule (20 mg total) by mouth daily. (Needs to be seen before next refill) What changed:   when to take this  additional instructions   pantoprazole 40 MG tablet Commonly known as: PROTONIX TAKE 1 TABLET DAILY BEFORE BREAKFAST   rosuvastatin 20 MG tablet Commonly known as: CRESTOR Take 1 tablet (20 mg total) by mouth daily at 6 PM for 30 days.   traZODone 150 MG tablet Commonly  known as: DESYREL TAKE ONE TABLET DAILY AT BEDTIME            Durable Medical Equipment  (From admission, onward)         Start     Ordered   12/17/18 1114  For home use only DME Bedside commode  Once    Question:  Patient needs a bedside commode to treat with the following condition  Answer:  Retroperitoneal bleed   12/17/18 1114         Follow-up Information    Dettinger, Elige RadonJoshua A, MD. Schedule an appointment as soon as possible for a visit in 1 week(s).   Specialties: Family Medicine, Cardiology Contact information: [address] [place] [address] [phone]          No Known Allergies  Consultations:  IR   Discharge Exam: Vitals:   12/17/18 0540 12/17/18 0559  BP: (!) 184/73 (!) 149/59  Pulse: 79 67  Resp: 18   Temp: 98.6 F (37 C)    SpO2: 98%      General: Pt is alert, awake, not in acute distress Cardiovascular: RRR, S1/S2 +, +systolic murmur Respiratory: CTA bilaterally, no wheezing, no rhonchi Abdominal: Soft, NT, ND, bowel sounds + Extremities: no edema, no cyanosis    The results of significant diagnostics from this hospitalization (including imaging, microbiology, ancillary and laboratory) are listed below for reference.     Microbiology: No results found for this or any previous visit (from the past 240 hour(s)).   Labs: BNP (last 3 results) No results for input(s): BNP in the last 8760 hours. Basic Metabolic Panel: Recent Labs  Lab 12/15/18 0234 12/16/18 0251 12/17/18 0241  NA 135 137 138  K 2.9* 3.4* 4.5  CL 98 101 100  CO2 27 29 29   GLUCOSE 111* 111* 119*  BUN 16 12 14   CREATININE 1.45* 1.15* 1.14*  CALCIUM 8.0* 8.2* 8.6*  MG  --  1.9  --    Liver Function Tests: Recent Labs  Lab 12/15/18 0234  AST 18  ALT 21  ALKPHOS 57  BILITOT 0.8  PROT 4.8*  ALBUMIN 2.7*   No results for input(s): LIPASE, AMYLASE in the last 168 hours. No results for input(s): AMMONIA in the last 168 hours. CBC: Recent Labs  Lab 12/16/18 0746 12/16/18 1357 12/16/18 2054 12/17/18 0241 12/17/18 1015  WBC 13.3* 13.0* 13.9* 14.2* 12.4*  NEUTROABS 9.2* 9.5* 9.5* 9.5* 9.0*  HGB 8.8* 8.8* 8.9* 8.5* 8.7*  HCT 26.5* 26.4* 26.1* 25.7* 25.9*  MCV 92.7 92.0 93.5 91.5 93.5  PLT 268 257 297 294 291   Cardiac Enzymes: No results for input(s): CKTOTAL, CKMB, CKMBINDEX, TROPONINI in the last 168 hours. BNP: Invalid input(s): POCBNP CBG: No results for input(s): GLUCAP in the last 168 hours. D-Dimer No results for input(s): DDIMER in the last 72 hours. Hgb A1c No results for input(s): HGBA1C in the last 72 hours. Lipid Profile No results for input(s): CHOL, HDL, LDLCALC, TRIG, CHOLHDL, LDLDIRECT in the last 72 hours. Thyroid function studies No results for input(s): TSH, T4TOTAL, T3FREE, THYROIDAB in  the last 72 hours.  Invalid input(s): FREET3 Anemia work up No results for input(s): VITAMINB12, FOLATE, FERRITIN, TIBC, IRON, RETICCTPCT in the last 72 hours. Urinalysis    Component Value Date/Time   COLORURINE STRAW (A) 09/21/2018 1320   APPEARANCEUR CLEAR 09/21/2018 1320   LABSPEC 1.019 09/21/2018 1320   PHURINE 6.0 09/21/2018 1320   GLUCOSEU NEGATIVE 09/21/2018 1320   HGBUR MODERATE (A)  09/21/2018 [place] 09/21/2018 [place] 09/21/2018 1320   PROTEINUR NEGATIVE 09/21/2018 1320   NITRITE NEGATIVE 09/21/2018 1320   LEUKOCYTESUR SMALL (A) 09/21/2018 1320   Sepsis Labs Invalid input(s): PROCALCITONIN,  WBC,  LACTICIDVEN Microbiology No results found for this or any previous visit (from the past 240 hour(s)).     Patient was seen and examined on the day of discharge and was found to be in stable condition. Time coordinating discharge: 25 minutes including assessment and coordination of care, as well as examination of the patient.   SIGNED:  Dessa Phi, DO [facility] [email or web address] 12/17/2018, 11:14 AM

## 2018-12-17 NOTE — TOC Transition Note (Signed)
Transition of Care [facility]) - CM/SW Discharge Note   Patient Details  Name: Pamela Hughes MRN: [id number] Date of Birth: 12/10/1940  [facility] of [facility] CM/SW Contact:  Carles Collet, RN Phone Number: 12/17/2018, 12:37 PM   Clinical Narrative:   Confirmed DC plan w daughter Pamela Hughes who will be providing transport home. [place] to follow up for [facility] PT OT and RW and 3/1 to be delivered to room prior to DC. No other CM needs identified.     Final next level of care: [place] Barriers to Discharge: No Barriers Identified   Patient Goals and CMS Choice Patient states their goals for this hospitalization and ongoing recovery are:: to return home CMS Medicare.gov Compare Post Acute Care list provided to:: Other (Comment Required) Choice offered to / list presented to : Adult Children  Discharge Placement                       Discharge Plan and Services                DME Arranged: 3-N-1, [place] rolling DME Agency: [employer] Date DME Agency Contacted: 12/17/18 Time DME Agency Contacted: 80 Representative spoke with at DME Agency: [place]: PT, OT [place] Agency: [place] ([place]) Date [place]: 12/17/18 Time [place]: 1237 Representative spoke with at [place]: [place] ([place]) Interventions     Readmission Risk Interventions No flowsheet data found.

## 2018-12-17 NOTE — Progress Notes (Signed)
Pt expected to be discharged with family. Pt currently a/o, VSS, showing no signs of distress. All belongings sent home with pt. MD aware of discharge. RN to continue to monitor.

## 2018-12-19 ENCOUNTER — Ambulatory Visit (HOSPITAL_COMMUNITY): Payer: Medicare Other

## 2018-12-19 DIAGNOSIS — R58 Hemorrhage, not elsewhere classified: Secondary | ICD-10-CM | POA: Diagnosis not present

## 2018-12-19 DIAGNOSIS — H9190 Unspecified hearing loss, unspecified ear: Secondary | ICD-10-CM | POA: Diagnosis not present

## 2018-12-19 DIAGNOSIS — E785 Hyperlipidemia, unspecified: Secondary | ICD-10-CM | POA: Diagnosis not present

## 2018-12-19 DIAGNOSIS — J449 Chronic obstructive pulmonary disease, unspecified: Secondary | ICD-10-CM | POA: Diagnosis not present

## 2018-12-19 DIAGNOSIS — M1990 Unspecified osteoarthritis, unspecified site: Secondary | ICD-10-CM | POA: Diagnosis not present

## 2018-12-19 DIAGNOSIS — K219 Gastro-esophageal reflux disease without esophagitis: Secondary | ICD-10-CM | POA: Diagnosis not present

## 2018-12-19 DIAGNOSIS — W182XXD Fall in (into) shower or empty bathtub, subsequent encounter: Secondary | ICD-10-CM | POA: Diagnosis not present

## 2018-12-19 DIAGNOSIS — Z8673 Personal history of transient ischemic attack (TIA), and cerebral infarction without residual deficits: Secondary | ICD-10-CM | POA: Diagnosis not present

## 2018-12-19 DIAGNOSIS — I129 Hypertensive chronic kidney disease with stage 1 through stage 4 chronic kidney disease, or unspecified chronic kidney disease: Secondary | ICD-10-CM | POA: Diagnosis not present

## 2018-12-19 DIAGNOSIS — N183 Chronic kidney disease, stage 3 (moderate): Secondary | ICD-10-CM | POA: Diagnosis not present

## 2018-12-19 DIAGNOSIS — Z87891 Personal history of nicotine dependence: Secondary | ICD-10-CM | POA: Diagnosis not present

## 2018-12-19 DIAGNOSIS — K52831 Collagenous colitis: Secondary | ICD-10-CM | POA: Diagnosis not present

## 2018-12-19 DIAGNOSIS — S301XXD Contusion of abdominal wall, subsequent encounter: Secondary | ICD-10-CM | POA: Diagnosis not present

## 2018-12-19 DIAGNOSIS — Z9181 History of falling: Secondary | ICD-10-CM | POA: Diagnosis not present

## 2018-12-20 ENCOUNTER — Ambulatory Visit: Payer: Self-pay | Admitting: Licensed Clinical Social Worker

## 2018-12-20 DIAGNOSIS — Z8673 Personal history of transient ischemic attack (TIA), and cerebral infarction without residual deficits: Secondary | ICD-10-CM

## 2018-12-20 DIAGNOSIS — R42 Dizziness and giddiness: Secondary | ICD-10-CM

## 2018-12-20 DIAGNOSIS — E785 Hyperlipidemia, unspecified: Secondary | ICD-10-CM

## 2018-12-20 DIAGNOSIS — R483 Visual agnosia: Secondary | ICD-10-CM

## 2018-12-20 DIAGNOSIS — R0602 Shortness of breath: Secondary | ICD-10-CM

## 2018-12-20 DIAGNOSIS — F331 Major depressive disorder, recurrent, moderate: Secondary | ICD-10-CM

## 2018-12-20 DIAGNOSIS — M19041 Primary osteoarthritis, right hand: Secondary | ICD-10-CM

## 2018-12-20 DIAGNOSIS — I10 Essential (primary) hypertension: Secondary | ICD-10-CM

## 2018-12-20 NOTE — Patient Instructions (Signed)
Licensed Clinical Social Worker Visit Information  Goals we discussed today:    Goals    . "I need help with getting food delivered to my home." (pt-stated)     Current Barriers:  Marland Kitchen Knowledge Deficits related to resources available to deliver food to her home.  . Nurse Case Manager Clinical Goal(s): Over the next 7 days, patient will work with LCSW to address needs related to food delivery options.   Interventions:  . LCSW previously provided education to patient re: ADTS and Meals On Wheels (waiting list) . Previously encouraged client to call ADTS to add her name to waiting list for Meals on Wheels.  . Discussed current food arrangements. She is signed up with [employer] in [place] and they deliver a food box. Her daughter and friend also brings things to her as needed.  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    Client said she has a friend, [name], who occasionally helps client with meals   Plan: LCSW to call client in next 3 weeks to talk with client about food needs of client and about local food resources Client to communicate with RN CM [name] to discuss nursing needs of client. Client to attend scheduled client medical appointments  Initial goal documentation       Materials Provided: No  Follow Up Plan: LCSW to call client in next 3 weeks to talk with client about food needs of client and about food resources in the area.   The patient verbalized understanding of instructions provided today and declined a print copy of patient instruction materials.   [name].Isatou Agredano MSW, LCSW Licensed Clinical Social Worker [facility] Care Management [phone]

## 2018-12-20 NOTE — Chronic Care Management (AMB) (Signed)
  Care Management Note   Pamela Hughes is a 78 y.o. year old female who is a primary care patient of Dettinger, Fransisca Kaufmann, MD. The CM team was consulted for assistance with chronic disease management and care coordination.   I reached out to [name] by phone today.   Review of patient status, including review of consultants reports, relevant laboratory and other test results, and collaboration with appropriate care team members and the patient's provider was performed as part of comprehensive patient evaluation and provision of chronic care management services.   Social Determinants of Health: risk of stress; risk of social isolation ; risk of tobacco use    Virtual BH Phone Follow Up from 08/02/2017 in [place]  PHQ-9 Total Score  10     GAD 7 : Generalized Anxiety Score 08/02/2017 07/12/2017  Nervous, Anxious, on Edge 0 0  Control/stop worrying 0 0  Worry too much - different things 0 0  Trouble relaxing 0 0  Restless 0 0  Easily annoyed or irritable 0 0  Afraid - awful might happen 0 0  Total GAD 7 Score 0 0    Goals    . "I need help with getting food delivered to my home." (pt-stated)     Current Barriers:  Marland Kitchen Knowledge Deficits related to resources available to deliver food to her home.  . Nurse Case Manager Clinical Goal(s): Over the next 7 days, patient will work with LCSW to address needs related to food delivery options.   Interventions:  . LCSW previously provided education to patient re: ADTS and Meals On Wheels (waiting list) . Previously encouraged client to call ADTS to add her name to waiting list for Meals on Wheels.  . Previously discussed current food arrangements. She is signed up with [employer] in [place] and they deliver a food box. Her daughter and friend also brings things to her as needed.  Marland Kitchen LCSW spoke with client about client food needs  Patient Self Care Activities:  Currently UNABLE TO independently drive due to dizziness s/p  CVA but she is able to warm up meals and feed herself.    Client said she has a friend, [name], who occasionally helps client with meals   Plan: LCSW to call client in next 3 weeks to talk with client about food needs of client and about local food resources Client to communicate with RN CM [name] to discuss nursing needs of client. Client to attend scheduled client medical appointments  Initial goal documentation       Follow Up Plan: LCSW to call client in next 3 weeks to talk with client about food needs of client and food resources in the area.   [name].Elisheva Fallas MSW, LCSW Licensed Clinical Social Worker [facility] Care Management [phone]

## 2018-12-21 ENCOUNTER — Ambulatory Visit (INDEPENDENT_AMBULATORY_CARE_PROVIDER_SITE_OTHER): Payer: Medicare Other | Admitting: Family Medicine

## 2018-12-21 NOTE — Progress Notes (Signed)
Attempted to call the patient 2 times and no answer

## 2018-12-22 ENCOUNTER — Emergency Department (HOSPITAL_COMMUNITY): Payer: Medicare Other

## 2018-12-22 ENCOUNTER — Other Ambulatory Visit: Payer: Self-pay

## 2018-12-22 ENCOUNTER — Emergency Department (HOSPITAL_COMMUNITY)
Admission: EM | Admit: 2018-12-22 | Discharge: 2018-12-26 | Disposition: A | Discharge status: Skilled Nursing Facility | Payer: Medicare Other | Attending: Emergency Medicine | Admitting: Emergency Medicine

## 2018-12-22 ENCOUNTER — Encounter (HOSPITAL_COMMUNITY): Payer: Self-pay | Admitting: *Deleted

## 2018-12-22 DIAGNOSIS — I959 Hypotension, unspecified: Secondary | ICD-10-CM | POA: Diagnosis not present

## 2018-12-22 DIAGNOSIS — M25532 Pain in left wrist: Secondary | ICD-10-CM | POA: Diagnosis not present

## 2018-12-22 DIAGNOSIS — K219 Gastro-esophageal reflux disease without esophagitis: Secondary | ICD-10-CM | POA: Diagnosis not present

## 2018-12-22 DIAGNOSIS — Z03818 Encounter for observation for suspected exposure to other biological agents ruled out: Secondary | ICD-10-CM | POA: Insufficient documentation

## 2018-12-22 DIAGNOSIS — Z87891 Personal history of nicotine dependence: Secondary | ICD-10-CM | POA: Insufficient documentation

## 2018-12-22 DIAGNOSIS — Z9181 History of falling: Secondary | ICD-10-CM | POA: Insufficient documentation

## 2018-12-22 DIAGNOSIS — R740 Nonspecific elevation of levels of transaminase and lactic acid dehydrogenase [LDH]: Secondary | ICD-10-CM | POA: Diagnosis not present

## 2018-12-22 DIAGNOSIS — R7401 Elevation of levels of liver transaminase levels: Secondary | ICD-10-CM

## 2018-12-22 DIAGNOSIS — I10 Essential (primary) hypertension: Secondary | ICD-10-CM | POA: Diagnosis not present

## 2018-12-22 DIAGNOSIS — G8929 Other chronic pain: Secondary | ICD-10-CM

## 2018-12-22 DIAGNOSIS — R531 Weakness: Secondary | ICD-10-CM | POA: Diagnosis not present

## 2018-12-22 DIAGNOSIS — K52831 Collagenous colitis: Secondary | ICD-10-CM | POA: Diagnosis not present

## 2018-12-22 DIAGNOSIS — Z8673 Personal history of transient ischemic attack (TIA), and cerebral infarction without residual deficits: Secondary | ICD-10-CM | POA: Diagnosis not present

## 2018-12-22 DIAGNOSIS — M25531 Pain in right wrist: Secondary | ICD-10-CM | POA: Diagnosis not present

## 2018-12-22 DIAGNOSIS — R1084 Generalized abdominal pain: Secondary | ICD-10-CM | POA: Insufficient documentation

## 2018-12-22 DIAGNOSIS — J45909 Unspecified asthma, uncomplicated: Secondary | ICD-10-CM | POA: Insufficient documentation

## 2018-12-22 DIAGNOSIS — S6991XA Unspecified injury of right wrist, hand and finger(s), initial encounter: Secondary | ICD-10-CM | POA: Diagnosis not present

## 2018-12-22 DIAGNOSIS — Z79899 Other long term (current) drug therapy: Secondary | ICD-10-CM | POA: Insufficient documentation

## 2018-12-22 DIAGNOSIS — S301XXD Contusion of abdominal wall, subsequent encounter: Secondary | ICD-10-CM | POA: Diagnosis not present

## 2018-12-22 DIAGNOSIS — S36892A Contusion of other intra-abdominal organs, initial encounter: Secondary | ICD-10-CM | POA: Diagnosis not present

## 2018-12-22 DIAGNOSIS — R0902 Hypoxemia: Secondary | ICD-10-CM | POA: Diagnosis not present

## 2018-12-22 DIAGNOSIS — S6992XA Unspecified injury of left wrist, hand and finger(s), initial encounter: Secondary | ICD-10-CM | POA: Diagnosis not present

## 2018-12-22 DIAGNOSIS — M1990 Unspecified osteoarthritis, unspecified site: Secondary | ICD-10-CM | POA: Diagnosis not present

## 2018-12-22 DIAGNOSIS — R52 Pain, unspecified: Secondary | ICD-10-CM | POA: Diagnosis not present

## 2018-12-22 DIAGNOSIS — R58 Hemorrhage, not elsewhere classified: Secondary | ICD-10-CM | POA: Diagnosis not present

## 2018-12-22 DIAGNOSIS — R5381 Other malaise: Secondary | ICD-10-CM | POA: Diagnosis not present

## 2018-12-22 DIAGNOSIS — E785 Hyperlipidemia, unspecified: Secondary | ICD-10-CM | POA: Diagnosis not present

## 2018-12-22 DIAGNOSIS — J449 Chronic obstructive pulmonary disease, unspecified: Secondary | ICD-10-CM | POA: Diagnosis not present

## 2018-12-22 DIAGNOSIS — M6281 Muscle weakness (generalized): Secondary | ICD-10-CM | POA: Diagnosis present

## 2018-12-22 DIAGNOSIS — S3662XA Contusion of rectum, initial encounter: Secondary | ICD-10-CM | POA: Diagnosis not present

## 2018-12-22 DIAGNOSIS — H9190 Unspecified hearing loss, unspecified ear: Secondary | ICD-10-CM | POA: Diagnosis not present

## 2018-12-22 DIAGNOSIS — W182XXD Fall in (into) shower or empty bathtub, subsequent encounter: Secondary | ICD-10-CM | POA: Diagnosis not present

## 2018-12-22 DIAGNOSIS — N183 Chronic kidney disease, stage 3 (moderate): Secondary | ICD-10-CM | POA: Diagnosis not present

## 2018-12-22 DIAGNOSIS — I129 Hypertensive chronic kidney disease with stage 1 through stage 4 chronic kidney disease, or unspecified chronic kidney disease: Secondary | ICD-10-CM | POA: Diagnosis not present

## 2018-12-22 LAB — LIPASE, BLOOD: Lipase: 29 U/L (ref 11–51)

## 2018-12-22 LAB — URINALYSIS, ROUTINE W REFLEX MICROSCOPIC
Bacteria, UA: NONE SEEN
Bilirubin Urine: NEGATIVE
Glucose, UA: NEGATIVE mg/dL
Ketones, ur: NEGATIVE mg/dL
Leukocytes,Ua: NEGATIVE
Nitrite: NEGATIVE
Protein, ur: 30 mg/dL — AB
Specific Gravity, Urine: 1.017 (ref 1.005–1.030)
pH: 6 (ref 5.0–8.0)

## 2018-12-22 LAB — CBC WITH DIFFERENTIAL/PLATELET
Abs Immature Granulocytes: 0.12 10*3/uL — ABNORMAL HIGH (ref 0.00–0.07)
Basophils Absolute: 0 10*3/uL (ref 0.0–0.1)
Basophils Relative: 0 %
Eosinophils Absolute: 0.4 10*3/uL (ref 0.0–0.5)
Eosinophils Relative: 3 %
HCT: 28.3 % — ABNORMAL LOW (ref 36.0–46.0)
Hemoglobin: 8.9 g/dL — ABNORMAL LOW (ref 12.0–15.0)
Immature Granulocytes: 1 %
Lymphocytes Relative: 7 %
Lymphs Abs: 1.1 10*3/uL (ref 0.7–4.0)
MCH: 29.5 pg (ref 26.0–34.0)
MCHC: 31.4 g/dL (ref 30.0–36.0)
MCV: 93.7 fL (ref 80.0–100.0)
Monocytes Absolute: 2.1 10*3/uL — ABNORMAL HIGH (ref 0.1–1.0)
Monocytes Relative: 14 %
Neutro Abs: 11.4 10*3/uL — ABNORMAL HIGH (ref 1.7–7.7)
Neutrophils Relative %: 75 %
Platelets: 565 10*3/uL — ABNORMAL HIGH (ref 150–400)
RBC: 3.02 MIL/uL — ABNORMAL LOW (ref 3.87–5.11)
RDW: 13.3 % (ref 11.5–15.5)
WBC: 15 10*3/uL — ABNORMAL HIGH (ref 4.0–10.5)
nRBC: 0 % (ref 0.0–0.2)

## 2018-12-22 LAB — COMPREHENSIVE METABOLIC PANEL
ALT: 126 U/L — ABNORMAL HIGH (ref 0–44)
AST: 140 U/L — ABNORMAL HIGH (ref 15–41)
Albumin: 2.8 g/dL — ABNORMAL LOW (ref 3.5–5.0)
Alkaline Phosphatase: 127 U/L — ABNORMAL HIGH (ref 38–126)
Anion gap: 9 (ref 5–15)
BUN: 40 mg/dL — ABNORMAL HIGH (ref 8–23)
CO2: 27 mmol/L (ref 22–32)
Calcium: 8.5 mg/dL — ABNORMAL LOW (ref 8.9–10.3)
Chloride: 96 mmol/L — ABNORMAL LOW (ref 98–111)
Creatinine, Ser: 1.44 mg/dL — ABNORMAL HIGH (ref 0.44–1.00)
GFR calc Af Amer: 40 mL/min — ABNORMAL LOW (ref >60)
GFR calc non Af Amer: 35 mL/min — ABNORMAL LOW (ref >60)
Glucose, Bld: 120 mg/dL — ABNORMAL HIGH (ref 70–99)
Potassium: 3.9 mmol/L (ref 3.5–5.1)
Sodium: 132 mmol/L — ABNORMAL LOW (ref 135–145)
Total Bilirubin: 1.2 mg/dL (ref 0.3–1.2)
Total Protein: 6.3 g/dL — ABNORMAL LOW (ref 6.5–8.1)

## 2018-12-22 LAB — POC OCCULT BLOOD, ED: Fecal Occult Bld: NEGATIVE

## 2018-12-22 MED ORDER — TRAZODONE HCL 50 MG PO TABS
150.0000 mg | ORAL_TABLET | Freq: Every day | ORAL | Status: DC
  Administered 2018-12-22 – 2018-12-25 (×4): 150 mg via ORAL
  Filled 2018-12-22 (×4): qty 3

## 2018-12-22 MED ORDER — BUPROPION HCL ER (SR) 150 MG PO TB12
150.0000 mg | ORAL_TABLET | Freq: Every day | ORAL | Status: DC
  Administered 2018-12-23 – 2018-12-26 (×4): 150 mg via ORAL
  Filled 2018-12-22 (×6): qty 1

## 2018-12-22 MED ORDER — METOPROLOL SUCCINATE ER 25 MG PO TB24
25.0000 mg | ORAL_TABLET | Freq: Every day | ORAL | Status: DC
  Administered 2018-12-24 – 2018-12-26 (×3): 25 mg via ORAL
  Filled 2018-12-22 (×4): qty 1

## 2018-12-22 MED ORDER — GABAPENTIN 100 MG PO CAPS
100.0000 mg | ORAL_CAPSULE | Freq: Three times a day (TID) | ORAL | Status: DC
  Administered 2018-12-22 – 2018-12-26 (×11): 100 mg via ORAL
  Filled 2018-12-22 (×12): qty 1

## 2018-12-22 MED ORDER — SODIUM CHLORIDE 0.9 % IV BOLUS
1000.0000 mL | Freq: Once | INTRAVENOUS | Status: AC
  Administered 2018-12-22: 1000 mL via INTRAVENOUS

## 2018-12-22 MED ORDER — IOHEXOL 300 MG/ML  SOLN
75.0000 mL | Freq: Once | INTRAMUSCULAR | Status: AC | PRN
  Administered 2018-12-22: 75 mL via INTRAVENOUS

## 2018-12-22 MED ORDER — AMLODIPINE BESYLATE 5 MG PO TABS
5.0000 mg | ORAL_TABLET | Freq: Every day | ORAL | Status: DC
  Administered 2018-12-25 – 2018-12-26 (×2): 5 mg via ORAL
  Filled 2018-12-22 (×4): qty 1

## 2018-12-22 MED ORDER — PANTOPRAZOLE SODIUM 40 MG PO TBEC
40.0000 mg | DELAYED_RELEASE_TABLET | Freq: Every day | ORAL | Status: DC
  Administered 2018-12-23 – 2018-12-26 (×4): 40 mg via ORAL
  Filled 2018-12-22 (×4): qty 1

## 2018-12-22 NOTE — ED Triage Notes (Addendum)
Pt brought in by [facility] with c/o ongoing weakness x 2 weeks since she was discharged from the hospital. Pt reports she was admitted to [facility] due to an abdominal hematoma and had 2 units of blood given. PT came out to house today and was concerned that maybe pt's hematoma was bleeding again since her weakness is not improving. Pt reports she isn't even able to stand. Pt also c/o no bowel movement in the last month. Pt is A&O x 4, denies pain. VSS per EMS.

## 2018-12-22 NOTE — ED Provider Notes (Signed)
[facility] EMERGENCY DEPARTMENT Provider Note   CSN: [id number] Arrival date & time: 12/22/18  1847     History   Chief Complaint Chief Complaint  Patient presents with  . Weakness    HPI Pamela Hughes is a 78 y.o. female.     The history is provided by the patient and medical records. No language interpreter was used.  Weakness    78 year old female with history of hypertension, colitis, asthma, depression, prior CVA Plavix, osteoarthritis, COPD recent retroperitoneal bleed when she was hospitalized on 7/16 and was discharged on 7/19 after having recent multiple falls.  Patient mention since being at home, she endorsed generalized weakness.  States she has not had a bowel movement in about 3 weeks.  She feels the urge to go but unable to produce any bowel movement.  She is able to pass flatus.  She normally is required help to get up to move about.  She does complain of bilateral wrist pain left greater than right.  She mention her daughter brought her here because they did not want to help her out of bed today.  Patient states she only needs help to get up and she would be able to move around.  She denies having worsening abdominal pain but states that "I hurt everywhere".  She normally will take Mobic for pain but due to her bleed she does not have any pain medication to take.  She does not complain of any fever, productive cough, nausea, vomiting, dysuria or focal weakness or numbness.  Past Medical History:  Diagnosis Date  . Allergy   . Alopecia   . Anxiety   . Arthritis   . Asthma   . Collagenous colitis   . Depression   . Diarrhea    chronic- "states for at least a year)  . Edema   . GERD (gastroesophageal reflux disease)   . HOH (hard of hearing)   . Hypertension     Patient Active Problem List   Diagnosis Date Noted  . Retroperitoneal bleed 12/14/2018  . Fall at home, initial encounter 12/14/2018  . Acute posthemorrhagic anemia 12/14/2018  . 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 Status post CVA 09/21/2018  . Abnormal CXR 06/03/2018  . Chronic cough 05/18/2018  . DOE (dyspnea on exertion) 05/18/2018  . Collagenous colitis 02/16/2017  . Family hx of colon cancer 11/11/2016  . Osteoarthritis of both hands 06/27/2015  . 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 Left foot pain 01/31/2015  . Allergic rhinitis 01/31/2015  . Hypertension   . Depression   . Allergy   . Alopecia   . Arthritis     Past Surgical History:  Procedure Laterality Date  . ABDOMINAL HYSTERECTOMY     one ovary left  . CATARACT EXTRACTION W/PHACO Right 02/17/2018   Procedure: CATARACT EXTRACTION PHACO AND INTRAOCULAR LENS PLACEMENT (IOC);  Surgeon: Fabio Pierce, MD;  Location: [facility];  Service: Ophthalmology;  Laterality: Right;  CDE: 4.84  . CATARACT EXTRACTION W/PHACO Left 03/31/2018   Procedure: CATARACT EXTRACTION PHACO AND INTRAOCULAR LENS PLACEMENT (IOC) LEFT CDE: 4.51;  Surgeon: Fabio Pierce, MD;  Location: [facility];  Service: Ophthalmology;  Laterality: Left;  . COLONOSCOPY N/A 12/17/2016   Procedure: COLONOSCOPY;  Surgeon: Malissa Hippo, MD;  Location: [facility] SUITE;  Service: Endoscopy;  Laterality: N/A;  2:05  . SPINE SURGERY  1990s   L spine  OB History   No obstetric history on file.      Home Medications    Prior to Admission medications   Medication Sig Start Date End Date Taking? Authorizing Provider  albuterol (PROVENTIL HFA;VENTOLIN HFA) 108 (90 Base) MCG/ACT inhaler Inhale 2 puffs into the lungs every 6 (six) hours as needed for wheezing or shortness of breath. 11/10/17   Timmothy Euler, MD  amLODipine (NORVASC) 5 MG tablet Take 1 tablet (5 mg total) by mouth daily. 12/06/18   Dettinger, Fransisca Kaufmann, MD  buPROPion (WELLBUTRIN SR) 150 MG 12 hr tablet Take 1 tablet (150 mg total) by mouth 2 (two) times daily. (Needs to be seen before next refill)  Patient taking differently: Take 150 mg by mouth daily.  09/15/18   Janora Norlander, DO  chlorthalidone (HYGROTON) 25 MG tablet TAKE ONE (1) TABLET EACH DAY Patient taking differently: Take 25 mg by mouth daily.  11/17/18   Dettinger, Fransisca Kaufmann, MD  gabapentin (NEURONTIN) 100 MG capsule Take 1 capsule (100 mg total) by mouth 3 (three) times daily. One three times daily 08/16/18   Tanda Rockers, MD  ipratropium (ATROVENT) 0.03 % nasal spray USE TWICE DAILY AS DIRECTED Patient taking differently: Place 1 spray into both nostrils daily.  11/17/18   Dettinger, Fransisca Kaufmann, MD  meclizine (ANTIVERT) 25 MG tablet TAKE ONE TABLET 3 TIMES A DAY AS NEEDED. Patient taking differently: Take 25 mg by mouth 3 (three) times daily as needed for dizziness.  10/20/18   Dettinger, Fransisca Kaufmann, MD  metoprolol succinate (TOPROL-XL) 25 MG 24 hr tablet Take 1 tablet (25 mg total) by mouth daily. 10/04/18   Dettinger, Fransisca Kaufmann, MD  omeprazole (PRILOSEC) 20 MG capsule Take 1 capsule (20 mg total) by mouth daily. (Needs to be seen before next refill) Patient taking differently: Take 20 mg by mouth every evening.  09/15/18   Janora Norlander, DO  pantoprazole (PROTONIX) 40 MG tablet TAKE 1 TABLET DAILY BEFORE BREAKFAST Patient taking differently: Take 40 mg by mouth daily before breakfast.  10/20/18   Tanda Rockers, MD  rosuvastatin (CRESTOR) 20 MG tablet Take 1 tablet (20 mg total) by mouth daily at 6 PM for 30 days. 09/23/18 12/16/18  Manuella Ghazi, Pratik D, DO  traZODone (DESYREL) 150 MG tablet TAKE ONE TABLET DAILY AT BEDTIME Patient taking differently: Take 150 mg by mouth at bedtime.  11/17/18   Dettinger, Fransisca Kaufmann, MD    Family History Family History  Problem Relation Age of Onset  . Alzheimer's disease Mother   . Alcohol abuse Father   . Diabetes Brother   . Healthy Daughter     Social History Social History   Tobacco Use  . Smoking status: Former Smoker    Packs/day: 0.25    Years: 5.00    Pack years: 1.25     Types: Cigarettes    Quit date: 10/09/1974    Years since quitting: 44.2  . Smokeless tobacco: Never Used  Substance Use Topics  . Alcohol use: Yes    Comment: glass of wine occasionally  . Drug use: No     Allergies   Patient has no known allergies.   Review of Systems Review of Systems  Neurological: Positive for weakness.  All other systems reviewed and are negative.    Physical Exam Updated Vital Signs BP (!) 125/57   Pulse 78   Temp 98.6 F (37 C) (Oral)   Resp 16   Ht 5' 5.5" (1.664 m)  Wt 63.5 kg   SpO2 95%   BMI 22.94 kg/m   Physical Exam Vitals signs and nursing note reviewed.  Constitutional:      General: She is not in acute distress.    Appearance: She is well-developed.     Comments: Elderly female appears nontoxic.  HENT:     Head: Atraumatic.  Eyes:     Conjunctiva/sclera: Conjunctivae normal.  Neck:     Musculoskeletal: Neck supple.  Cardiovascular:     Rate and Rhythm: Normal rate and regular rhythm.     Pulses: Normal pulses.     Heart sounds: Normal heart sounds.  Pulmonary:     Effort: Pulmonary effort is normal.     Breath sounds: Normal breath sounds.  Abdominal:     Palpations: Abdomen is soft.     Tenderness: There is abdominal tenderness (Tenderness to right posterior abdomen/back with moderate sized ecchymosis appreciated.).  Genitourinary:    Comments: Chaperone present during exam.  Ecchymosis noted to mons pubis.  On rectal examination, normal rectal tone, no obvious mass, normal color stool, no impacted stool and no rectal bleeding. Musculoskeletal:        General: Tenderness (Tenderness to bilateral wrist, left greater than right without any deformity.) present.  Skin:    Findings: Bruising (Numerous hematoma noted throughout body.) present. No rash.  Neurological:     Mental Status: She is alert and oriented to person, place, and time.  Psychiatric:        Mood and Affect: Mood normal.      ED Treatments / Results   Labs (all labs ordered are listed, but only abnormal results are displayed) Labs Reviewed  CBC WITH DIFFERENTIAL/PLATELET - Abnormal; Notable for the following components:      Result Value   WBC 15.0 (*)    RBC 3.02 (*)    Hemoglobin 8.9 (*)    HCT 28.3 (*)    Platelets 565 (*)    Neutro Abs 11.4 (*)    Monocytes Absolute 2.1 (*)    Abs Immature Granulocytes 0.12 (*)    All other components within normal limits  COMPREHENSIVE METABOLIC PANEL - Abnormal; Notable for the following components:   Sodium 132 (*)    Chloride 96 (*)    Glucose, Bld 120 (*)    BUN 40 (*)    Creatinine, Ser 1.44 (*)    Calcium 8.5 (*)    Total Protein 6.3 (*)    Albumin 2.8 (*)    AST 140 (*)    ALT 126 (*)    Alkaline Phosphatase 127 (*)    GFR calc non Af Amer 35 (*)    GFR calc Af Amer 40 (*)    All other components within normal limits  LIPASE, BLOOD  URINALYSIS, ROUTINE W REFLEX MICROSCOPIC  POC OCCULT BLOOD, ED    EKG None  ED ECG REPORT   Date: 12/22/2018  Rate: 75  Rhythm: normal sinus rhythm  QRS Axis: normal  Intervals: normal  ST/T Wave abnormalities: normal  Conduction Disutrbances:none  Narrative Interpretation:   Old EKG Reviewed: unchanged  I have personally reviewed the EKG tracing and agree with the computerized printout as noted.   Radiology Dg Wrist Complete Left  Result Date: 12/22/2018 CLINICAL DATA:  Fall 2 weeks ago, diffuse wrist pain EXAM: RIGHT WRIST - COMPLETE 3+ VIEW; LEFT WRIST - COMPLETE 3+ VIEW COMPARISON:  None. FINDINGS: No fracture or dislocation of the bilateral wrists. The carpi are normally aligned. 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 mild bilateral radiocarpal arthrosis. The soft tissues are unremarkable. IMPRESSION: No fracture or dislocation of the bilateral wrists. The carpi are normally aligned. There is very severe bilateral, left worse than right basal and triscaphe  joint arthrosis of the thumbs. There is mild bilateral radiocarpal arthrosis. The soft tissues are unremarkable. Electronically Signed   By: Lauralyn PrimesAlex  Bibbey M.D.   On: 12/22/2018 20:39   Dg Wrist Complete Right  Result Date: 12/22/2018 CLINICAL DATA:  Fall 2 weeks ago, diffuse wrist pain EXAM: RIGHT WRIST - COMPLETE 3+ VIEW; LEFT WRIST - COMPLETE 3+ VIEW COMPARISON:  None. FINDINGS: No fracture or dislocation of the bilateral wrists. The carpi are normally aligned. 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 mild bilateral radiocarpal arthrosis. The soft tissues are unremarkable. IMPRESSION: No fracture or dislocation of the bilateral wrists. The carpi are normally aligned. 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 mild bilateral radiocarpal arthrosis. The soft tissues are unremarkable. Electronically Signed   By: Lauralyn PrimesAlex  Bibbey M.D.   On: 12/22/2018 20:39   Dg Abd Acute W/chest  Result Date: 12/22/2018 CLINICAL DATA:  Weakness EXAM: DG ABDOMEN ACUTE W/ 1V CHEST COMPARISON:  12/14/2018 FINDINGS: Cardiac shadow is within normal limits. The lungs are clear bilaterally. Scattered large and small bowel gas is noted. No obstructive changes are seen. No bony abnormality is noted. Persistent increased density is noted in the right hemipelvis consistent with the patient's known hematoma. No bony abnormality is noted. IMPRESSION: Persistent soft tissue density in the hemipelvis consistent with the patient's known hematoma. No new focal abnormality is noted. Electronically Signed   By: Alcide CleverMark  Lukens M.D.   On: 12/22/2018 19:52    Procedures Procedures (including critical care time)  Medications Ordered in ED Medications  sodium chloride 0.9 % bolus 1,000 mL (has no administration in time range)     Initial Impression / Assessment and Plan / ED Course  I have reviewed the  triage vital signs and the nursing notes.  Pertinent labs & imaging results that were available during my care of the patient were reviewed by me and considered in my medical decision making (see chart for details).        BP (!) 125/57   Pulse 78   Temp 98.6 F (37 C) (Oral)   Resp 16   Ht 5' 5.5" (1.664 m)   Wt 63.5 kg   SpO2 95%   BMI 22.94 kg/m    Final Clinical Impressions(s) / ED Diagnoses   Final diagnoses:  Weakness  Transaminitis  Chronic pain of both wrists    ED Discharge Orders    None     7:39 PM Patient here for ongoing weakness since she was discharged from the hospital after several falls and she was found to have an abdominal hematoma from a bleeding blood vessel.  She did receive several units of blood during hospitalization.  She is here because she is too weak to even stand up according to family members.  Furthermore, she has not had a bowel movement in the past several weeks.  Patient denies any significant pain unless she moves when palpation.  She does have multiple ecchymosis noted throughout body and pain to both of her wrists left greater than right.  She  has a large ecchymosis to her right lower back.  She has no impacted stool on exam.  9:18 PM  Patient has an elevated white count of 15.0.  Her hemoglobin is 8.9 near baseline.  Labs remarkable for new transaminitis with AST 140, ALT 126, and alkaline phosphatase at 127.  This is higher than her baseline.  Mild AKI with creatinine of 1.44, IV fluid given.  In the setting of elevated white count and transaminitis will obtain abdominal pelvis CT scan for reassessment.\  Patient does endorse bilateral wrist pain, x-ray of both breasts without any acute fracture or dislocation however there is severe joint arthrosis likely contributing to her chronic wrist pain.  An abdominal and pelvis x-ray with chest showing persistent soft tissue density in the hemipelvis consistent with patient's known hematoma but  no new focal abnormalities noted.  The lungs are clear bilaterally.  Care discussed with Dr. Ranae PalmsYelverton.   9:51 PM Pt sign out to Dr. Ranae PalmsYelverton who will f/u on CT scan of the abd/pelvis and will determine disposition.    Fayrene Helperran, Quamere Mussell, PA-C 12/22/18 2153    Loren RacerYelverton, David, MD 12/22/18 2212

## 2018-12-22 NOTE — ED Notes (Signed)
Patient transported to CT 

## 2018-12-23 MED ORDER — ACETAMINOPHEN 325 MG PO TABS
650.0000 mg | ORAL_TABLET | Freq: Once | ORAL | Status: AC
  Administered 2018-12-23: 650 mg via ORAL
  Filled 2018-12-23: qty 2

## 2018-12-23 NOTE — ED Notes (Signed)
Meal tray given 

## 2018-12-23 NOTE — ED Notes (Signed)
Pt repositioned, linens and gown changed, pure wick placed

## 2018-12-24 NOTE — ED Notes (Signed)
Patient had full linen change at 19:00. Patient's pure wick changed and canister changed at 21:01.

## 2018-12-24 NOTE — ED Notes (Signed)
Have spoken with Physical Therapist who is going to now assess patient.

## 2018-12-24 NOTE — ED Notes (Signed)
Pt is resting peacefully. Have placed meal tray at bedside

## 2018-12-24 NOTE — ED Notes (Signed)
Patient given oral fluids, repositioned and given warm blankets.

## 2018-12-24 NOTE — Plan of Care (Signed)
  Problem: Acute Rehab PT Goals(only PT should resolve) Goal: Pt Will Go Supine/Side To Sit Outcome: Progressing Flowsheets (Taken 12/24/2018 1046) Pt will go Supine/Side to Sit: with supervision Goal: Pt Will Go Sit To Supine/Side Outcome: Progressing Flowsheets (Taken 12/24/2018 1046) Pt will go Sit to Supine/Side: with supervision Goal: Patient Will Transfer Sit To/From Stand Outcome: Progressing Flowsheets (Taken 12/24/2018 1046) Patient will transfer sit to/from stand: with supervision Goal: Pt Will Ambulate Outcome: Progressing Flowsheets (Taken 12/24/2018 1046) Pt will Ambulate:  50 feet  with min guard assist  with least restrictive assistive device
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
## 2018-12-24 NOTE — ED Notes (Signed)
Speaking with Physical Therapist about recommendations

## 2018-12-25 NOTE — ED Notes (Signed)
Patient's pure wick changed along with bed linen and brief. Patient repositioned.

## 2018-12-25 NOTE — ED Notes (Signed)
SW at bedside.

## 2018-12-25 NOTE — NC FL2 (Signed)
[place] MEDICAID FL2 LEVEL OF CARE SCREENING TOOL     IDENTIFICATION  Patient Name: Pamela Hughes CommentDoris P Winski Birthdate: Oct 22, 1940 Sex: female Admission Date (Current Location): 12/22/2018  [facility] [place]   Facility and Address:  [facility],  [address], [place] [id number]      Provider Number: [id number]  Attending Physician Name and Address:  Default, Provider, MD  Relative Name and Phone Number:       Current Level of Care: Hospital Recommended Level of Care: Skilled Nursing Facility Prior Approval Number:    Date Approved/Denied:   PASRR Number:    Discharge Plan: SNF    Current Diagnoses: Patient Active Problem List   Diagnosis Date Noted  . Retroperitoneal bleed 12/14/2018  . Fall at home, initial encounter 12/14/2018  . Acute posthemorrhagic anemia 12/14/2018  . 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 Status post CVA 09/21/2018  . Abnormal CXR 06/03/2018  . Chronic cough 05/18/2018  . DOE (dyspnea on exertion) 05/18/2018  . Collagenous colitis 02/16/2017  . Family hx of colon cancer 11/11/2016  . Osteoarthritis of both hands 06/27/2015  . 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 Left foot pain 01/31/2015  . Allergic rhinitis 01/31/2015  . Hypertension   . Depression   . Allergy   . Alopecia   . Arthritis     Orientation RESPIRATION BLADDER Height & Weight     Self, Time, Situation, Place  Normal Continent Weight: 63.5 kg Height:  5' 5.5" (166.4 cm)  BEHAVIORAL SYMPTOMS/MOOD NEUROLOGICAL BOWEL NUTRITION STATUS  (none) (none) Continent Diet  AMBULATORY STATUS COMMUNICATION OF NEEDS Skin   Extensive Assist Verbally Normal                       Personal Care Assistance Level of Assistance  Bathing, Feeding, Dressing Bathing Assistance: Maximum assistance Feeding assistance: Independent Dressing Assistance: Limited  assistance     Functional Limitations Info  Sight, Hearing, Speech Sight Info: Adequate Hearing Info: Adequate Speech Info: Adequate    SPECIAL CARE FACTORS FREQUENCY  PT (By licensed PT)     PT Frequency: 5X/W              Contractures Contractures Info: Not present    Additional Factors Info  Allergies    Code Status   Allergies Info: NKA    Full Code       Current Medications (12/25/2018):  This is the current hospital active medication list Current Facility-Administered Medications  Medication Dose Route Frequency Provider Last Rate Last Dose  . amLODipine (NORVASC) tablet 5 mg  5 mg Oral Daily Loren RacerYelverton, David, MD      . buPROPion [facility](WELLBUTRIN SR) 12 hr tablet 150 mg  150 mg Oral Daily Loren RacerYelverton, David, MD   150 mg at 12/24/18 1104  . gabapentin (NEURONTIN) capsule 100 mg  100 mg Oral TID Loren RacerYelverton, David, MD   100 mg at 12/24/18 2201  . metoprolol succinate (TOPROL-XL) 24 hr tablet 25 mg  25 mg Oral Daily Loren RacerYelverton, David, MD   25 mg at 12/24/18 1104  . pantoprazole (PROTONIX) EC tablet 40 mg  40 mg Oral Daily Loren RacerYelverton, David, MD   40 mg at 12/24/18 1104  . traZODone (DESYREL) tablet 150 mg  150 mg Oral QHS Loren RacerYelverton, David, MD   150 mg at 12/24/18 2201   Current Outpatient Medications  Medication Sig Dispense Refill  .  amLODipine (NORVASC) 5 MG tablet Take 1 tablet (5 mg total) by mouth daily. 90 tablet 3  . buPROPion (WELLBUTRIN SR) 150 MG 12 hr tablet Take 1 tablet (150 mg total) by mouth 2 (two) times daily. (Needs to be seen before next refill) (Patient taking differently: Take 150 mg by mouth daily. ) 60 tablet 0  . chlorthalidone (HYGROTON) 25 MG tablet TAKE ONE (1) TABLET EACH DAY 30 tablet 2  . gabapentin (NEURONTIN) 100 MG capsule Take 1 capsule (100 mg total) by mouth 3 (three) times daily. One three times daily 90 capsule 2  . ipratropium (ATROVENT) 0.03 % nasal spray USE TWICE DAILY AS DIRECTED (Patient taking differently: Place 1 spray into both  nostrils daily. ) 30 mL 0  . meclizine (ANTIVERT) 25 MG tablet TAKE ONE TABLET 3 TIMES A DAY AS NEEDED. (Patient taking differently: Take 25 mg by mouth 3 (three) times daily as needed for dizziness. ) 30 tablet 1  . pantoprazole (PROTONIX) 40 MG tablet TAKE 1 TABLET DAILY BEFORE BREAKFAST (Patient taking differently: Take 40 mg by mouth daily before breakfast. ) 30 tablet 2  . rosuvastatin (CRESTOR) 20 MG tablet Take 1 tablet (20 mg total) by mouth daily at 6 PM for 30 days. 30 tablet 3  . traZODone (DESYREL) 150 MG tablet TAKE ONE TABLET DAILY AT BEDTIME (Patient taking differently: Take 150 mg by mouth at bedtime. ) 30 tablet 0     Discharge Medications: Please see discharge summary for a list of discharge medications.  Relevant Imaging Results:  Relevant Lab Results:   Additional [place], LCSW
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
## 2018-12-25 NOTE — NC FL2 (Signed)
[place] MEDICAID FL2 LEVEL OF CARE SCREENING TOOL     IDENTIFICATION  Patient Name: Pamela Hughes Birthdate: 20-Apr-1941 Sex: female Admission Date (Current Location): 12/22/2018  [facility] and [place] Number:  [employer] and Address:  [place] [address] [place]      Provider Number: [id number]  Attending Physician Name and Address:  Default, Provider, MD  Relative Name and Phone Number:       Current Level of Care: Hospital Recommended Level of Care: [place] Prior Approval Number:    Date Approved/Denied:   PASRR Number: [id number]  Discharge Plan: SNF    Current Diagnoses: Patient Active Problem List   Diagnosis Date Noted  . Retroperitoneal bleed 12/14/2018  . Fall at home, initial encounter 12/14/2018  . Acute posthemorrhagic anemia 12/14/2018  . 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 Status post CVA 09/21/2018  . Abnormal CXR 06/03/2018  . Chronic cough 05/18/2018  . DOE (dyspnea on exertion) 05/18/2018  . Collagenous colitis 02/16/2017  . Family hx of colon cancer 11/11/2016  . Osteoarthritis of both hands 06/27/2015  . 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 Left foot pain 01/31/2015  . Allergic rhinitis 01/31/2015  . Hypertension   . Depression   . Allergy   . Alopecia   . Arthritis     Orientation RESPIRATION BLADDER Height & Weight     Self, Time, Situation, Place  Normal Continent Weight: 63.5 kg Height:  5' 5.5" (166.4 cm)  BEHAVIORAL SYMPTOMS/MOOD NEUROLOGICAL BOWEL NUTRITION STATUS  (none) (none) Continent Diet  AMBULATORY STATUS COMMUNICATION OF NEEDS Skin   Extensive Assist Verbally Normal                       Personal Care Assistance Level of Assistance  Bathing, Feeding, Dressing Bathing Assistance: Maximum assistance Feeding assistance: Independent Dressing Assistance:  Limited assistance     Functional Limitations Info  Sight, Hearing, Speech Sight Info: Adequate Hearing Info: Adequate Speech Info: Adequate    SPECIAL CARE FACTORS FREQUENCY  PT (By licensed PT)     PT Frequency: 5X/W              Contractures Contractures Info: Not present    Additional Factors Info  Allergies   Allergies Info: NKA           Current Medications (12/25/2018):  This is the current hospital active medication list Current Facility-Administered Medications  Medication Dose Route Frequency Provider Last Rate Last Dose  . amLODipine (NORVASC) tablet 5 mg  5 mg Oral Daily Julianne Rice, MD   5 mg at 12/25/18 1032  . buPROPion (WELLBUTRIN SR) 12 hr tablet 150 mg  150 mg Oral Daily Julianne Rice, MD   150 mg at 12/25/18 1200  . gabapentin (NEURONTIN) capsule 100 mg  100 mg Oral TID Julianne Rice, MD   100 mg at 12/25/18 1032  . metoprolol succinate (TOPROL-XL) 24 hr tablet 25 mg  25 mg Oral Daily Julianne Rice, MD   25 mg at 12/25/18 1033  . pantoprazole (PROTONIX) EC tablet 40 mg  40 mg Oral Daily Julianne Rice, MD   40 mg at 12/25/18 1032  . traZODone (DESYREL) tablet 150 mg  150 mg Oral QHS Julianne Rice, MD   150 mg at 12/24/18 2201   Current Outpatient Medications  Medication Sig Dispense Refill  . amLODipine (NORVASC)  5 MG tablet Take 1 tablet (5 mg total) by mouth daily. 90 tablet 3  . buPROPion (WELLBUTRIN SR) 150 MG 12 hr tablet Take 1 tablet (150 mg total) by mouth 2 (two) times daily. (Needs to be seen before next refill) (Patient taking differently: Take 150 mg by mouth daily. ) 60 tablet 0  . chlorthalidone (HYGROTON) 25 MG tablet TAKE ONE (1) TABLET EACH DAY 30 tablet 2  . gabapentin (NEURONTIN) 100 MG capsule Take 1 capsule (100 mg total) by mouth 3 (three) times daily. One three times daily 90 capsule 2  . ipratropium (ATROVENT) 0.03 % nasal spray USE TWICE DAILY AS DIRECTED (Patient taking differently: Place 1 spray into both  nostrils daily. ) 30 mL 0  . meclizine (ANTIVERT) 25 MG tablet TAKE ONE TABLET 3 TIMES A DAY AS NEEDED. (Patient taking differently: Take 25 mg by mouth 3 (three) times daily as needed for dizziness. ) 30 tablet 1  . pantoprazole (PROTONIX) 40 MG tablet TAKE 1 TABLET DAILY BEFORE BREAKFAST (Patient taking differently: Take 40 mg by mouth daily before breakfast. ) 30 tablet 2  . rosuvastatin (CRESTOR) 20 MG tablet Take 1 tablet (20 mg total) by mouth daily at 6 PM for 30 days. 30 tablet 3  . traZODone (DESYREL) 150 MG tablet TAKE ONE TABLET DAILY AT BEDTIME (Patient taking differently: Take 150 mg by mouth at bedtime. ) 30 tablet 0     Discharge Medications: Please see discharge summary for a list of discharge medications.  Relevant Imaging Results:  Relevant Lab Results:   Additional Information    Ida Rogueodney B Chay Mazzoni, LCSW

## 2018-12-25 NOTE — ED Notes (Signed)
External catheter changed

## 2018-12-25 NOTE — ED Notes (Signed)
Patient given water and soda and warm blankets. Patient repositioned.

## 2018-12-26 ENCOUNTER — Ambulatory Visit: Payer: Self-pay | Admitting: Licensed Clinical Social Worker

## 2018-12-26 DIAGNOSIS — M1990 Unspecified osteoarthritis, unspecified site: Secondary | ICD-10-CM | POA: Diagnosis not present

## 2018-12-26 DIAGNOSIS — M25532 Pain in left wrist: Secondary | ICD-10-CM | POA: Diagnosis not present

## 2018-12-26 DIAGNOSIS — Z79899 Other long term (current) drug therapy: Secondary | ICD-10-CM | POA: Diagnosis not present

## 2018-12-26 DIAGNOSIS — Z8673 Personal history of transient ischemic attack (TIA), and cerebral infarction without residual deficits: Secondary | ICD-10-CM | POA: Diagnosis not present

## 2018-12-26 DIAGNOSIS — D62 Acute posthemorrhagic anemia: Secondary | ICD-10-CM | POA: Diagnosis not present

## 2018-12-26 DIAGNOSIS — R483 Visual agnosia: Secondary | ICD-10-CM

## 2018-12-26 DIAGNOSIS — J45909 Unspecified asthma, uncomplicated: Secondary | ICD-10-CM | POA: Diagnosis not present

## 2018-12-26 DIAGNOSIS — K52831 Collagenous colitis: Secondary | ICD-10-CM | POA: Diagnosis not present

## 2018-12-26 DIAGNOSIS — K219 Gastro-esophageal reflux disease without esophagitis: Secondary | ICD-10-CM | POA: Diagnosis not present

## 2018-12-26 DIAGNOSIS — Z7401 Bed confinement status: Secondary | ICD-10-CM | POA: Diagnosis not present

## 2018-12-26 DIAGNOSIS — I129 Hypertensive chronic kidney disease with stage 1 through stage 4 chronic kidney disease, or unspecified chronic kidney disease: Secondary | ICD-10-CM | POA: Diagnosis not present

## 2018-12-26 DIAGNOSIS — Z9181 History of falling: Secondary | ICD-10-CM | POA: Diagnosis not present

## 2018-12-26 DIAGNOSIS — E785 Hyperlipidemia, unspecified: Secondary | ICD-10-CM | POA: Diagnosis not present

## 2018-12-26 DIAGNOSIS — R42 Dizziness and giddiness: Secondary | ICD-10-CM

## 2018-12-26 DIAGNOSIS — F331 Major depressive disorder, recurrent, moderate: Secondary | ICD-10-CM

## 2018-12-26 DIAGNOSIS — M19041 Primary osteoarthritis, right hand: Secondary | ICD-10-CM

## 2018-12-26 DIAGNOSIS — M19042 Primary osteoarthritis, left hand: Secondary | ICD-10-CM

## 2018-12-26 DIAGNOSIS — I10 Essential (primary) hypertension: Secondary | ICD-10-CM | POA: Diagnosis not present

## 2018-12-26 DIAGNOSIS — Z87891 Personal history of nicotine dependence: Secondary | ICD-10-CM | POA: Diagnosis not present

## 2018-12-26 DIAGNOSIS — J449 Chronic obstructive pulmonary disease, unspecified: Secondary | ICD-10-CM | POA: Diagnosis not present

## 2018-12-26 DIAGNOSIS — Z03818 Encounter for observation for suspected exposure to other biological agents ruled out: Secondary | ICD-10-CM | POA: Diagnosis not present

## 2018-12-26 DIAGNOSIS — R1084 Generalized abdominal pain: Secondary | ICD-10-CM | POA: Diagnosis not present

## 2018-12-26 DIAGNOSIS — R262 Difficulty in walking, not elsewhere classified: Secondary | ICD-10-CM | POA: Diagnosis not present

## 2018-12-26 DIAGNOSIS — W010XXD Fall on same level from slipping, tripping and stumbling without subsequent striking against object, subsequent encounter: Secondary | ICD-10-CM | POA: Diagnosis not present

## 2018-12-26 DIAGNOSIS — R29898 Other symptoms and signs involving the musculoskeletal system: Secondary | ICD-10-CM | POA: Diagnosis not present

## 2018-12-26 DIAGNOSIS — R531 Weakness: Secondary | ICD-10-CM | POA: Diagnosis not present

## 2018-12-26 DIAGNOSIS — N183 Chronic kidney disease, stage 3 (moderate): Secondary | ICD-10-CM | POA: Diagnosis not present

## 2018-12-26 DIAGNOSIS — H9190 Unspecified hearing loss, unspecified ear: Secondary | ICD-10-CM | POA: Diagnosis not present

## 2018-12-26 DIAGNOSIS — S301XXD Contusion of abdominal wall, subsequent encounter: Secondary | ICD-10-CM | POA: Diagnosis not present

## 2018-12-26 DIAGNOSIS — K661 Hemoperitoneum: Secondary | ICD-10-CM | POA: Diagnosis not present

## 2018-12-26 DIAGNOSIS — M25531 Pain in right wrist: Secondary | ICD-10-CM | POA: Diagnosis not present

## 2018-12-26 DIAGNOSIS — M6281 Muscle weakness (generalized): Secondary | ICD-10-CM | POA: Diagnosis not present

## 2018-12-26 DIAGNOSIS — W182XXD Fall in (into) shower or empty bathtub, subsequent encounter: Secondary | ICD-10-CM | POA: Diagnosis not present

## 2018-12-26 DIAGNOSIS — G8929 Other chronic pain: Secondary | ICD-10-CM | POA: Diagnosis not present

## 2018-12-26 DIAGNOSIS — R58 Hemorrhage, not elsewhere classified: Secondary | ICD-10-CM | POA: Diagnosis not present

## 2018-12-26 DIAGNOSIS — R740 Nonspecific elevation of levels of transaminase and lactic acid dehydrogenase [LDH]: Secondary | ICD-10-CM | POA: Diagnosis not present

## 2018-12-26 DIAGNOSIS — J4520 Mild intermittent asthma, uncomplicated: Secondary | ICD-10-CM | POA: Diagnosis not present

## 2018-12-26 DIAGNOSIS — R0602 Shortness of breath: Secondary | ICD-10-CM

## 2018-12-26 LAB — SARS CORONAVIRUS 2 BY RT PCR (HOSPITAL ORDER, PERFORMED IN ~~LOC~~ HOSPITAL LAB): SARS Coronavirus 2: NEGATIVE

## 2018-12-26 NOTE — ED Notes (Signed)
ED TO INPATIENT HANDOFF REPORT  ED Nurse Name and Phone #: Imaad Reuss [phone]  S Name/Age/Gender Pamela Hughes 78 y.o. female Room/Bed: APA05/APA05  Code Status   Code Status: Full Code  Home/SNF/Other Home Patient oriented to: self, place, time and situation Is this baseline? Yes   Triage Complete: Triage complete  Chief Complaint Generalized Weakness  Triage Note Pt brought in by [facility] with c/o ongoing weakness x 2 weeks since she was discharged from the hospital. Pt reports she was admitted to [facility] due to an abdominal hematoma and had 2 units of blood given. PT came out to house today and was concerned that maybe pt's hematoma was bleeding again since her weakness is not improving. Pt reports she isn't even able to stand. Pt also c/o no bowel movement in the last month. Pt is A&O x 4, denies pain. VSS per EMS.   Allergies No Known Allergies  Level of Care/Admitting Diagnosis ED Disposition    None      B Medical/Surgery History Past Medical History:  Diagnosis Date  . Allergy   . Alopecia   . Anxiety   . Arthritis   . Asthma   . Collagenous colitis   . Depression   . Diarrhea    chronic- "states for at least a year)  . Edema   . GERD (gastroesophageal reflux disease)   . HOH (hard of hearing)   . Hypertension    Past Surgical History:  Procedure Laterality Date  . ABDOMINAL HYSTERECTOMY     one ovary left  . CATARACT EXTRACTION W/PHACO Right 02/17/2018   Procedure: CATARACT EXTRACTION PHACO AND INTRAOCULAR LENS PLACEMENT (IOC);  Surgeon: Baruch Goldmann, MD;  Location: [facility];  Service: Ophthalmology;  Laterality: Right;  CDE: 4.84  . CATARACT EXTRACTION W/PHACO Left 03/31/2018   Procedure: CATARACT EXTRACTION PHACO AND INTRAOCULAR LENS PLACEMENT (IOC) LEFT CDE: 4.51;  Surgeon: Baruch Goldmann, MD;  Location: [facility];  Service: Ophthalmology;  Laterality: Left;  . COLONOSCOPY N/A 12/17/2016   Procedure: COLONOSCOPY;  Surgeon: Rogene Houston, MD;  Location:  [facility] SU[facility];  Service: Endoscopy;  Laterality: N/A;  2:05  . SPINE SURGERY  1990s   L spine     A IV Location/Drains/Wounds Patient Lines/Drains/Airways Status   Active Line/Drains/Airways    Name:   Placement date:   Placement time:   Site:   Days:   Peripheral IV 12/22/18 Right Antecubital   12/22/18    2134    Antecubital   4   External Urinary Catheter   12/24/18    0636    -   2          Intake/Output Last 24 hours  Intake/Output Summary (Last 24 hours) at 12/26/2018 1422 Last data filed at 12/26/2018 0636 Gross per 24 hour  Intake -  Output 1000 ml  Net -1000 ml    Labs/Imaging Results for orders placed or performed during the hospital encounter of 12/22/18 (from the past 48 hour(s))  SARS Coronavirus 2 (CEPHEID - Performed in [place] hospital lab), Hosp Order     Status: None   Collection Time: 12/26/18 10:55 AM   Specimen: Nasopharyngeal Swab  Result Value Ref Range   SARS Coronavirus 2 NEGATIVE NEGATIVE    Comment: (NOTE) If result is NEGATIVE SARS-CoV-2 target nucleic acids are NOT DETECTED. The SARS-CoV-2 RNA is generally detectable in upper and lower  respiratory specimens during the acute phase of infection. The lowest  concentration of SARS-CoV-2 viral copies  this assay can detect is 250  copies / mL. A negative result does not preclude SARS-CoV-2 infection  and should not be used as the sole basis for treatment or other  patient management decisions.  A negative result may occur with  improper specimen collection / handling, submission of specimen other  than nasopharyngeal swab, presence of viral mutation(s) within the  areas targeted by this assay, and inadequate number of viral copies  (<250 copies / mL). A negative result must be combined with clinical  observations, patient history, and epidemiological information. If result is POSITIVE SARS-CoV-2 target nucleic acids are DETECTED. The SARS-CoV-2 RNA is generally detectable in upper and lower   respiratory specimens dur ing the acute phase of infection.  Positive  results are indicative of active infection with SARS-CoV-2.  Clinical  correlation with patient history and other diagnostic information is  necessary to determine patient infection status.  Positive results do  not rule out bacterial infection or co-infection with other viruses. If result is PRESUMPTIVE POSTIVE SARS-CoV-2 nucleic acids MAY BE PRESENT.   A presumptive positive result was obtained on the submitted specimen  and confirmed on repeat testing.  While 2019 novel coronavirus  (SARS-CoV-2) nucleic acids may be present in the submitted sample  additional confirmatory testing may be necessary for epidemiological  and / or clinical management purposes  to differentiate between  SARS-CoV-2 and other Sarbecovirus currently known to infect humans.  If clinically indicated additional testing with an alternate test  methodology [phone]([id number]) is advised. The SARS-CoV-2 RNA is generally  detectable in upper and lower respiratory sp ecimens during the acute  phase of infection. The expected result is Negative. Fact Sheet for Patients:  BoilerBrush.com.cyhttps://www.fda.gov/media/136312/download Fact Sheet for Healthcare Providers: https://pope.com/https://www.fda.gov/media/136313/download This test is not yet approved or cleared by the [place] FDA and has been authorized for detection and/or diagnosis of SARS-CoV-2 by FDA under an Emergency Use Authorization (EUA).  This EUA will remain in effect (meaning this test can be used) for the duration of the COVID-19 declaration under Section 564(b)(1) of the Act, 21 U.S.C. section 360bbb-3(b)(1), unless the authorization is terminated or revoked sooner. Performed at [facility], [address] [place], [place] [id number]    No results found.  Pending Labs Unresulted Labs (From admission, onward)   None      Vitals/Pain Today's Vitals   12/26/18 1000 12/26/18 1100 12/26/18 1200 12/26/18 1300   BP: (!) 141/59 (!) 147/53 (!) 140/49 (!) 133/55  Pulse: 85  79 80  Resp: 13 14 11 19   Temp:      TempSrc:      SpO2: 94%  97% 95%  Weight:      Height:      PainSc:        Isolation Precautions No active isolations  Medications Medications  amLODipine (NORVASC) tablet 5 mg (5 mg Oral Given 12/26/18 1006)  gabapentin (NEURONTIN) capsule 100 mg (100 mg Oral Given 12/26/18 1006)  buPROPion (WELLBUTRIN SR) 12 hr tablet 150 mg (150 mg Oral Given 12/26/18 1006)  metoprolol succinate (TOPROL-XL) 24 hr tablet 25 mg (25 mg Oral Given 12/26/18 1006)  pantoprazole (PROTONIX) EC tablet 40 mg (40 mg Oral Given 12/26/18 1006)  traZODone (DESYREL) tablet 150 mg (150 mg Oral Given 12/25/18 2249)  sodium chloride 0.9 % bolus 1,000 mL (0 mLs Intravenous Stopped 12/23/18 0113)  iohexol (OMNIPAQUE) 300 MG/ML solution 75 mL (75 mLs Intravenous Contrast Given 12/22/18 2216)  acetaminophen (TYLENOL) tablet 650 mg (650 mg  Oral Given 12/23/18 [id number])    Mobility non-ambulatory Moderate fall risk   Focused Assessments    R Recommendations: See Admitting Provider Note  Report given to:   Additional Notes:

## 2018-12-26 NOTE — TOC Transition Note (Signed)
Transition of Care [facility]) - CM/SW Discharge Note   Patient Details  Name: Pamela Hughes MRN: [id number] Date of Birth: [birth year]  Transition of Care [facility]) CM/SW Contact:  Trish Mage, LCSW Phone Number: 12/26/2018, 2:25 PM   Clinical Narrative:   Received word of authorization today.  Pt to transfer to [facility] via EMS.  COVID result faxed, as well as updated FL2.  Nursing alerted.  CSW sign off.    Final next level of care: Skilled Nursing Facility Barriers to Discharge: Barriers Resolved   Patient Goals and CMS Choice Patient states their goals for this hospitalization and ongoing recovery are:: Can you help me get my daughter and her family out of my home?  I'm afraid to go back there. CMS Medicare.gov Compare Post Acute Care list provided to:: Patient Choice offered to / list presented to : Patient  Discharge Placement PASRR number recieved: 12/25/18            Patient chooses bed at: [facility] Patient to be transferred to facility by: EMS Name of family member notified: NA Patient and family notified of of transfer: 12/26/18  Discharge Plan and Services   Discharge Planning Services: CM Consult Post Acute Care Choice: [place]                               Social Determinants of Health (SDOH) Interventions     Readmission Risk Interventions No flowsheet data found.
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
## 2018-12-26 NOTE — ED Notes (Signed)
Pt bathed with gown and full linen changed. Purewick, suction tubing and container changed.

## 2018-12-26 NOTE — ED Notes (Signed)
[facility] called to transport pt to [facility] at this time.
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
## 2018-12-27 DIAGNOSIS — K219 Gastro-esophageal reflux disease without esophagitis: Secondary | ICD-10-CM | POA: Diagnosis not present

## 2018-12-27 DIAGNOSIS — S301XXD Contusion of abdominal wall, subsequent encounter: Secondary | ICD-10-CM | POA: Diagnosis not present

## 2018-12-27 DIAGNOSIS — I10 Essential (primary) hypertension: Secondary | ICD-10-CM | POA: Diagnosis not present

## 2018-12-28 DIAGNOSIS — I10 Essential (primary) hypertension: Secondary | ICD-10-CM | POA: Diagnosis not present

## 2018-12-28 DIAGNOSIS — K219 Gastro-esophageal reflux disease without esophagitis: Secondary | ICD-10-CM | POA: Diagnosis not present

## 2018-12-28 DIAGNOSIS — S301XXD Contusion of abdominal wall, subsequent encounter: Secondary | ICD-10-CM | POA: Diagnosis not present

## 2018-12-28 DIAGNOSIS — J4520 Mild intermittent asthma, uncomplicated: Secondary | ICD-10-CM | POA: Diagnosis not present

## 2019-01-04 ENCOUNTER — Ambulatory Visit: Payer: Medicare Other | Admitting: Diagnostic Neuroimaging

## 2019-01-04 DIAGNOSIS — I10 Essential (primary) hypertension: Secondary | ICD-10-CM | POA: Diagnosis not present

## 2019-01-04 DIAGNOSIS — K219 Gastro-esophageal reflux disease without esophagitis: Secondary | ICD-10-CM | POA: Diagnosis not present

## 2019-01-04 DIAGNOSIS — S301XXD Contusion of abdominal wall, subsequent encounter: Secondary | ICD-10-CM | POA: Diagnosis not present

## 2019-01-04 DIAGNOSIS — J4520 Mild intermittent asthma, uncomplicated: Secondary | ICD-10-CM | POA: Diagnosis not present

## 2019-01-10 ENCOUNTER — Ambulatory Visit (INDEPENDENT_AMBULATORY_CARE_PROVIDER_SITE_OTHER): Payer: Medicare Other | Admitting: Licensed Clinical Social Worker

## 2019-01-10 DIAGNOSIS — F331 Major depressive disorder, recurrent, moderate: Secondary | ICD-10-CM

## 2019-01-10 DIAGNOSIS — E785 Hyperlipidemia, unspecified: Secondary | ICD-10-CM | POA: Diagnosis not present

## 2019-01-10 DIAGNOSIS — I10 Essential (primary) hypertension: Secondary | ICD-10-CM

## 2019-01-10 DIAGNOSIS — M19042 Primary osteoarthritis, left hand: Secondary | ICD-10-CM | POA: Diagnosis not present

## 2019-01-10 DIAGNOSIS — Z8673 Personal history of transient ischemic attack (TIA), and cerebral infarction without residual deficits: Secondary | ICD-10-CM

## 2019-01-10 DIAGNOSIS — R483 Visual agnosia: Secondary | ICD-10-CM

## 2019-01-10 DIAGNOSIS — M19041 Primary osteoarthritis, right hand: Secondary | ICD-10-CM

## 2019-01-10 DIAGNOSIS — R0602 Shortness of breath: Secondary | ICD-10-CM

## 2019-01-10 DIAGNOSIS — R42 Dizziness and giddiness: Secondary | ICD-10-CM

## 2019-01-10 NOTE — Patient Instructions (Addendum)
Licensed Clinical Water engineer Provided:  No   Client is currently receiving care at [facility] in [place], [place]. She is receiving physical therapy support and nursing support. She is using a walker to help her ambulate.  She thinks physical therapy sessions are helpful to client. She said she hopes to discharge back home on Friday, if all goes as planned.  She said that her daughter and son in law live in the home and help provide care for her in her home. She said she did not have any pain issues. She said she has a walker at home and has a shower seat at home.  She has a 3 in 1 bedside commode at home as well She said she was not depressed and was in a positive mood.  LCSW encouraged client to call LCSW as needed to discuss social work needs of client  Follow Up Plan:LCSW to call client in next 3 weeks to assess client status and to assess social work needs of client  The patient verbalized understanding of instructions provided today and declined a print copy of patient instruction materials.   [name].Ashly Goethe MSW, LCSW Licensed Clinical Social Worker [facility] Care Management [phone]

## 2019-01-10 NOTE — Chronic Care Management (AMB) (Signed)
  Care Management Note   Pamela Hughes is a 78 y.o. year old female who is a primary care patient of Dettinger, Fransisca Kaufmann, MD. The CM team was consulted for assistance with chronic disease management and care coordination.   I reached out to Pamela Hughes by phone today.    Review of patient status, including review of consultants reports, relevant laboratory and other test results, and collaboration with appropriate care team members and the patient's provider was performed as part of comprehensive patient evaluation and provision of chronic care management services.   Social Determinants of Health: risk of social isolation; risk of tobacco use; risk of stress    [facility] [place] Phone Follow Up from 08/02/2017 in [place]  PHQ-9 Total Score  10     GAD 7 : Generalized Anxiety Score 08/02/2017 07/12/2017  Nervous, Anxious, on Edge 0 0  Control/stop worrying 0 0  Worry too much - different things 0 0  Trouble relaxing 0 0  Restless 0 0  Easily annoyed or irritable 0 0  Afraid - awful might happen 0 0  Total GAD 7 Score 0 0   Client is currently receiving care at [facility] in [place], [place]. She is receiving physical therapy support and nursing support. She is using a walker to help her ambulate.  She thinks physical therapy sessions are helpful to client. She said she hopes to discharge back home on Friday, if all goes as planned.  She said that her daughter and son in law live in the home and help provide care for her in her home. She said she did not have any pain issues. She said she has a walker at home and has a shower seat at home.  She has a 3 in 1 bedside commode at home as well She said she was not depressed and was in a positive mood.  LCSW encouraged client to call LCSW as needed to discuss social work needs of client  Follow Up Plan: LCSW to call client in next 3 weeks to assess client status and to assess social work needs of client  [name].Alma Mohiuddin MSW,  LCSW Licensed Clinical Social Worker [facility] Care Management [phone]

## 2019-01-11 DIAGNOSIS — K219 Gastro-esophageal reflux disease without esophagitis: Secondary | ICD-10-CM | POA: Diagnosis not present

## 2019-01-11 DIAGNOSIS — S301XXD Contusion of abdominal wall, subsequent encounter: Secondary | ICD-10-CM | POA: Diagnosis not present

## 2019-01-11 DIAGNOSIS — I10 Essential (primary) hypertension: Secondary | ICD-10-CM | POA: Diagnosis not present

## 2019-01-11 DIAGNOSIS — J4520 Mild intermittent asthma, uncomplicated: Secondary | ICD-10-CM | POA: Diagnosis not present

## 2019-01-12 DIAGNOSIS — Z9181 History of falling: Secondary | ICD-10-CM | POA: Diagnosis not present

## 2019-01-12 DIAGNOSIS — S301XXD Contusion of abdominal wall, subsequent encounter: Secondary | ICD-10-CM | POA: Diagnosis not present

## 2019-01-12 DIAGNOSIS — J449 Chronic obstructive pulmonary disease, unspecified: Secondary | ICD-10-CM | POA: Diagnosis not present

## 2019-01-12 DIAGNOSIS — W182XXD Fall in (into) shower or empty bathtub, subsequent encounter: Secondary | ICD-10-CM | POA: Diagnosis not present

## 2019-01-12 DIAGNOSIS — E785 Hyperlipidemia, unspecified: Secondary | ICD-10-CM | POA: Diagnosis not present

## 2019-01-12 DIAGNOSIS — N183 Chronic kidney disease, stage 3 (moderate): Secondary | ICD-10-CM | POA: Diagnosis not present

## 2019-01-12 DIAGNOSIS — K52831 Collagenous colitis: Secondary | ICD-10-CM | POA: Diagnosis not present

## 2019-01-12 DIAGNOSIS — I129 Hypertensive chronic kidney disease with stage 1 through stage 4 chronic kidney disease, or unspecified chronic kidney disease: Secondary | ICD-10-CM | POA: Diagnosis not present

## 2019-01-12 DIAGNOSIS — Z8673 Personal history of transient ischemic attack (TIA), and cerebral infarction without residual deficits: Secondary | ICD-10-CM | POA: Diagnosis not present

## 2019-01-12 DIAGNOSIS — R58 Hemorrhage, not elsewhere classified: Secondary | ICD-10-CM | POA: Diagnosis not present

## 2019-01-12 DIAGNOSIS — H9190 Unspecified hearing loss, unspecified ear: Secondary | ICD-10-CM | POA: Diagnosis not present

## 2019-01-12 DIAGNOSIS — K219 Gastro-esophageal reflux disease without esophagitis: Secondary | ICD-10-CM | POA: Diagnosis not present

## 2019-01-12 DIAGNOSIS — M1990 Unspecified osteoarthritis, unspecified site: Secondary | ICD-10-CM | POA: Diagnosis not present

## 2019-01-12 DIAGNOSIS — Z87891 Personal history of nicotine dependence: Secondary | ICD-10-CM | POA: Diagnosis not present

## 2019-01-17 DIAGNOSIS — R58 Hemorrhage, not elsewhere classified: Secondary | ICD-10-CM | POA: Diagnosis not present

## 2019-01-17 DIAGNOSIS — Z8673 Personal history of transient ischemic attack (TIA), and cerebral infarction without residual deficits: Secondary | ICD-10-CM | POA: Diagnosis not present

## 2019-01-17 DIAGNOSIS — W182XXD Fall in (into) shower or empty bathtub, subsequent encounter: Secondary | ICD-10-CM | POA: Diagnosis not present

## 2019-01-17 DIAGNOSIS — K219 Gastro-esophageal reflux disease without esophagitis: Secondary | ICD-10-CM | POA: Diagnosis not present

## 2019-01-17 DIAGNOSIS — S301XXD Contusion of abdominal wall, subsequent encounter: Secondary | ICD-10-CM | POA: Diagnosis not present

## 2019-01-17 DIAGNOSIS — I129 Hypertensive chronic kidney disease with stage 1 through stage 4 chronic kidney disease, or unspecified chronic kidney disease: Secondary | ICD-10-CM | POA: Diagnosis not present

## 2019-01-17 DIAGNOSIS — N183 Chronic kidney disease, stage 3 (moderate): Secondary | ICD-10-CM | POA: Diagnosis not present

## 2019-01-17 DIAGNOSIS — H9190 Unspecified hearing loss, unspecified ear: Secondary | ICD-10-CM | POA: Diagnosis not present

## 2019-01-17 DIAGNOSIS — K52831 Collagenous colitis: Secondary | ICD-10-CM | POA: Diagnosis not present

## 2019-01-17 DIAGNOSIS — J449 Chronic obstructive pulmonary disease, unspecified: Secondary | ICD-10-CM | POA: Diagnosis not present

## 2019-01-17 DIAGNOSIS — M1990 Unspecified osteoarthritis, unspecified site: Secondary | ICD-10-CM | POA: Diagnosis not present

## 2019-01-17 DIAGNOSIS — E785 Hyperlipidemia, unspecified: Secondary | ICD-10-CM | POA: Diagnosis not present

## 2019-01-17 DIAGNOSIS — Z9181 History of falling: Secondary | ICD-10-CM | POA: Diagnosis not present

## 2019-01-17 DIAGNOSIS — Z87891 Personal history of nicotine dependence: Secondary | ICD-10-CM | POA: Diagnosis not present

## 2019-01-19 ENCOUNTER — Telehealth: Payer: Self-pay | Admitting: Family Medicine

## 2019-01-22 DIAGNOSIS — Z9181 History of falling: Secondary | ICD-10-CM | POA: Diagnosis not present

## 2019-01-22 DIAGNOSIS — W182XXD Fall in (into) shower or empty bathtub, subsequent encounter: Secondary | ICD-10-CM | POA: Diagnosis not present

## 2019-01-22 DIAGNOSIS — R58 Hemorrhage, not elsewhere classified: Secondary | ICD-10-CM | POA: Diagnosis not present

## 2019-01-22 DIAGNOSIS — N183 Chronic kidney disease, stage 3 (moderate): Secondary | ICD-10-CM | POA: Diagnosis not present

## 2019-01-22 DIAGNOSIS — M1990 Unspecified osteoarthritis, unspecified site: Secondary | ICD-10-CM | POA: Diagnosis not present

## 2019-01-22 DIAGNOSIS — E785 Hyperlipidemia, unspecified: Secondary | ICD-10-CM | POA: Diagnosis not present

## 2019-01-22 DIAGNOSIS — K219 Gastro-esophageal reflux disease without esophagitis: Secondary | ICD-10-CM | POA: Diagnosis not present

## 2019-01-22 DIAGNOSIS — Z8673 Personal history of transient ischemic attack (TIA), and cerebral infarction without residual deficits: Secondary | ICD-10-CM | POA: Diagnosis not present

## 2019-01-22 DIAGNOSIS — J449 Chronic obstructive pulmonary disease, unspecified: Secondary | ICD-10-CM | POA: Diagnosis not present

## 2019-01-22 DIAGNOSIS — Z87891 Personal history of nicotine dependence: Secondary | ICD-10-CM | POA: Diagnosis not present

## 2019-01-22 DIAGNOSIS — H9190 Unspecified hearing loss, unspecified ear: Secondary | ICD-10-CM | POA: Diagnosis not present

## 2019-01-22 DIAGNOSIS — S301XXD Contusion of abdominal wall, subsequent encounter: Secondary | ICD-10-CM | POA: Diagnosis not present

## 2019-01-22 DIAGNOSIS — I129 Hypertensive chronic kidney disease with stage 1 through stage 4 chronic kidney disease, or unspecified chronic kidney disease: Secondary | ICD-10-CM | POA: Diagnosis not present

## 2019-01-22 DIAGNOSIS — K52831 Collagenous colitis: Secondary | ICD-10-CM | POA: Diagnosis not present

## 2019-01-23 ENCOUNTER — Ambulatory Visit: Payer: Medicare Other | Admitting: Family Medicine

## 2019-01-26 DIAGNOSIS — Z8673 Personal history of transient ischemic attack (TIA), and cerebral infarction without residual deficits: Secondary | ICD-10-CM | POA: Diagnosis not present

## 2019-01-26 DIAGNOSIS — N183 Chronic kidney disease, stage 3 (moderate): Secondary | ICD-10-CM | POA: Diagnosis not present

## 2019-01-26 DIAGNOSIS — K219 Gastro-esophageal reflux disease without esophagitis: Secondary | ICD-10-CM | POA: Diagnosis not present

## 2019-01-26 DIAGNOSIS — H9190 Unspecified hearing loss, unspecified ear: Secondary | ICD-10-CM | POA: Diagnosis not present

## 2019-01-26 DIAGNOSIS — Z9181 History of falling: Secondary | ICD-10-CM | POA: Diagnosis not present

## 2019-01-26 DIAGNOSIS — S301XXD Contusion of abdominal wall, subsequent encounter: Secondary | ICD-10-CM | POA: Diagnosis not present

## 2019-01-26 DIAGNOSIS — I129 Hypertensive chronic kidney disease with stage 1 through stage 4 chronic kidney disease, or unspecified chronic kidney disease: Secondary | ICD-10-CM | POA: Diagnosis not present

## 2019-01-26 DIAGNOSIS — K52831 Collagenous colitis: Secondary | ICD-10-CM | POA: Diagnosis not present

## 2019-01-26 DIAGNOSIS — M1990 Unspecified osteoarthritis, unspecified site: Secondary | ICD-10-CM | POA: Diagnosis not present

## 2019-01-26 DIAGNOSIS — Z87891 Personal history of nicotine dependence: Secondary | ICD-10-CM | POA: Diagnosis not present

## 2019-01-26 DIAGNOSIS — E785 Hyperlipidemia, unspecified: Secondary | ICD-10-CM | POA: Diagnosis not present

## 2019-01-26 DIAGNOSIS — J449 Chronic obstructive pulmonary disease, unspecified: Secondary | ICD-10-CM | POA: Diagnosis not present

## 2019-01-26 DIAGNOSIS — W182XXD Fall in (into) shower or empty bathtub, subsequent encounter: Secondary | ICD-10-CM | POA: Diagnosis not present

## 2019-01-26 DIAGNOSIS — R58 Hemorrhage, not elsewhere classified: Secondary | ICD-10-CM | POA: Diagnosis not present

## 2019-01-30 DIAGNOSIS — E785 Hyperlipidemia, unspecified: Secondary | ICD-10-CM | POA: Diagnosis not present

## 2019-01-30 DIAGNOSIS — Z9181 History of falling: Secondary | ICD-10-CM | POA: Diagnosis not present

## 2019-01-30 DIAGNOSIS — H9190 Unspecified hearing loss, unspecified ear: Secondary | ICD-10-CM | POA: Diagnosis not present

## 2019-01-30 DIAGNOSIS — J449 Chronic obstructive pulmonary disease, unspecified: Secondary | ICD-10-CM | POA: Diagnosis not present

## 2019-01-30 DIAGNOSIS — N183 Chronic kidney disease, stage 3 (moderate): Secondary | ICD-10-CM | POA: Diagnosis not present

## 2019-01-30 DIAGNOSIS — K219 Gastro-esophageal reflux disease without esophagitis: Secondary | ICD-10-CM | POA: Diagnosis not present

## 2019-01-30 DIAGNOSIS — M1990 Unspecified osteoarthritis, unspecified site: Secondary | ICD-10-CM | POA: Diagnosis not present

## 2019-01-30 DIAGNOSIS — K52831 Collagenous colitis: Secondary | ICD-10-CM | POA: Diagnosis not present

## 2019-01-30 DIAGNOSIS — Z8673 Personal history of transient ischemic attack (TIA), and cerebral infarction without residual deficits: Secondary | ICD-10-CM | POA: Diagnosis not present

## 2019-01-30 DIAGNOSIS — Z87891 Personal history of nicotine dependence: Secondary | ICD-10-CM | POA: Diagnosis not present

## 2019-01-30 DIAGNOSIS — W182XXD Fall in (into) shower or empty bathtub, subsequent encounter: Secondary | ICD-10-CM | POA: Diagnosis not present

## 2019-01-30 DIAGNOSIS — S301XXD Contusion of abdominal wall, subsequent encounter: Secondary | ICD-10-CM | POA: Diagnosis not present

## 2019-01-30 DIAGNOSIS — I129 Hypertensive chronic kidney disease with stage 1 through stage 4 chronic kidney disease, or unspecified chronic kidney disease: Secondary | ICD-10-CM | POA: Diagnosis not present

## 2019-01-30 DIAGNOSIS — R58 Hemorrhage, not elsewhere classified: Secondary | ICD-10-CM | POA: Diagnosis not present

## 2019-01-31 ENCOUNTER — Telehealth: Payer: Self-pay | Admitting: *Deleted
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
## 2019-02-01 ENCOUNTER — Ambulatory Visit (INDEPENDENT_AMBULATORY_CARE_PROVIDER_SITE_OTHER): Payer: Medicare Other | Admitting: Licensed Clinical Social Worker

## 2019-02-01 DIAGNOSIS — F331 Major depressive disorder, recurrent, moderate: Secondary | ICD-10-CM

## 2019-02-01 DIAGNOSIS — R483 Visual agnosia: Secondary | ICD-10-CM

## 2019-02-01 DIAGNOSIS — Z8673 Personal history of transient ischemic attack (TIA), and cerebral infarction without residual deficits: Secondary | ICD-10-CM

## 2019-02-01 DIAGNOSIS — I10 Essential (primary) hypertension: Secondary | ICD-10-CM

## 2019-02-01 DIAGNOSIS — R0602 Shortness of breath: Secondary | ICD-10-CM

## 2019-02-01 DIAGNOSIS — M19042 Primary osteoarthritis, left hand: Secondary | ICD-10-CM

## 2019-02-01 DIAGNOSIS — E785 Hyperlipidemia, unspecified: Secondary | ICD-10-CM

## 2019-02-01 DIAGNOSIS — R42 Dizziness and giddiness: Secondary | ICD-10-CM

## 2019-02-01 DIAGNOSIS — M19041 Primary osteoarthritis, right hand: Secondary | ICD-10-CM

## 2019-02-01 NOTE — Chronic Care Management (AMB) (Signed)
  Care Management Note   Pamela Hughes is a 78 y.o. year old female who is a primary care patient of Dettinger, Fransisca Kaufmann, MD. The CM team was consulted for assistance with chronic disease management and care coordination.   I reached out to [employer] /daughter Pamela Hughes by phone today.   Review of patient status, including review of consultants reports, relevant laboratory and other test results, and collaboration with appropriate care team members and the patient's provider was performed as part of comprehensive patient evaluation and provision of chronic care management services.   Social Determinants of Health: risk of social isolation; risk of tobacco use; risk of stress    Virtual [facility] Phone Follow Up from 08/02/2017 in [place]  PHQ-9 Total Score  10     GAD 7 : Generalized Anxiety Score 08/02/2017 07/12/2017  Nervous, Anxious, on Edge 0 0  Control/stop worrying 0 0  Worry too much - different things 0 0  Trouble relaxing 0 0  Restless 0 0  Easily annoyed or irritable 0 0  Afraid - awful might happen 0 0  Total GAD 7 Score 0 0   Medications    amLODipine (NORVASC) 5 MG tablet    buPROPion (WELLBUTRIN SR) 150 MG 12 hr tablet    chlorthalidone (HYGROTON) 25 MG tablet    gabapentin (NEURONTIN) 100 MG capsule    ipratropium (ATROVENT) 0.03 % nasal spray    meclizine (ANTIVERT) 25 MG tablet    pantoprazole (PROTONIX) 40 MG tablet    rosuvastatin (CRESTOR) 20 MG tablet(Expired)    traZODone (DESYREL) 150 MG tablet     Goals    . "I need help with getting food delivered to my home." (pt-stated)     Current Barriers:  Marland Kitchen Knowledge Deficits related to resources available to deliver food to her home.  . Nurse Case Manager Clinical Goal(s): Over the next 7 days, patient will work with LCSW to address needs related to food delivery options.   Interventions:  . LCSW previously provided education to patient re: ADTS and Meals On Wheels (waiting list)  . Previously encouraged client to call ADTS to add her name to waiting list for Meals on Wheels.  . Previously discussed current food arrangements. She is signed up with [employer] in [place] and they deliver a food box. Her daughter and friend also brings things to her as needed.  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    Client said she has a friend, Pamela Hughes, who occasionally helps client with meals   Plan: LCSW to call client in next 3 weeks to talk with client about food needs of client and about local food resources Client to communicate with RN CM Pamela Hughes to discuss nursing needs of client. Client to attend scheduled client medical appointments  Initial goal documentation       Follow Up Plan: LCSW to call client in next 3 weeks to talk with client about food needs of client  and to talk with client about local food resources  Pamela Hughes.Pamela Hughes MSW, LCSW Licensed Clinical Social Worker [facility] Care Management [phone]

## 2019-02-01 NOTE — Patient Instructions (Signed)
Licensed Clinical Social Worker Visit Information  Goals we discussed today:  Goals    . "I need help with getting food delivered to my home." (pt-stated)     Current Barriers:  Marland Kitchen Knowledge Deficits related to resources available to deliver food to her home.  . Nurse Case Manager Clinical Goal(s): Over the next 7 days, patient will work with LCSW to address needs related to food delivery options.   Interventions:  . Previously LCSW provided education to patient re: ADTS and Meals On Wheels (waiting list) . Previously encouraged client to call ADTS to add her name to waiting list for Meals on Wheels.  . Previously discussed current food arrangements. She is signed up with [employer] in [place] and they deliver a food box. Her daughter and friend also brings things to her as needed.  . 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    Client said she has a friend, [name], who occasionally helps client with meals   Plan: LCSW to call client in next 3 weeks to talk with client about food needs of client and about local food resources Client to communicate with RN CM [name] to discuss nursing needs of client. Client to attend scheduled client medical appointments  Initial goal documentation        Materials Provided: No  Follow Up Plan: LCSW to call client in next 3 weeks to talk with client about food needs of client and about food resources in the area  The patient verbalized understanding of instructions provided today and declined a print copy of patient instruction materials.   [name].Hamda Klutts MSW, LCSW Licensed Clinical Social Worker [facility] Care Management [phone]

## 2019-02-02 ENCOUNTER — Other Ambulatory Visit: Payer: Self-pay

## 2019-02-02 ENCOUNTER — Ambulatory Visit: Payer: Medicare Other | Admitting: Family Medicine

## 2019-02-02 DIAGNOSIS — Z8673 Personal history of transient ischemic attack (TIA), and cerebral infarction without residual deficits: Secondary | ICD-10-CM | POA: Diagnosis not present

## 2019-02-02 DIAGNOSIS — K219 Gastro-esophageal reflux disease without esophagitis: Secondary | ICD-10-CM | POA: Diagnosis not present

## 2019-02-02 DIAGNOSIS — Z9181 History of falling: Secondary | ICD-10-CM | POA: Diagnosis not present

## 2019-02-02 DIAGNOSIS — W182XXD Fall in (into) shower or empty bathtub, subsequent encounter: Secondary | ICD-10-CM | POA: Diagnosis not present

## 2019-02-02 DIAGNOSIS — S301XXD Contusion of abdominal wall, subsequent encounter: Secondary | ICD-10-CM | POA: Diagnosis not present

## 2019-02-02 DIAGNOSIS — J449 Chronic obstructive pulmonary disease, unspecified: Secondary | ICD-10-CM | POA: Diagnosis not present

## 2019-02-02 DIAGNOSIS — K52831 Collagenous colitis: Secondary | ICD-10-CM | POA: Diagnosis not present

## 2019-02-02 DIAGNOSIS — R58 Hemorrhage, not elsewhere classified: Secondary | ICD-10-CM | POA: Diagnosis not present

## 2019-02-02 DIAGNOSIS — E785 Hyperlipidemia, unspecified: Secondary | ICD-10-CM | POA: Diagnosis not present

## 2019-02-02 DIAGNOSIS — Z87891 Personal history of nicotine dependence: Secondary | ICD-10-CM | POA: Diagnosis not present

## 2019-02-02 DIAGNOSIS — I129 Hypertensive chronic kidney disease with stage 1 through stage 4 chronic kidney disease, or unspecified chronic kidney disease: Secondary | ICD-10-CM | POA: Diagnosis not present

## 2019-02-02 DIAGNOSIS — H9190 Unspecified hearing loss, unspecified ear: Secondary | ICD-10-CM | POA: Diagnosis not present

## 2019-02-02 DIAGNOSIS — M1990 Unspecified osteoarthritis, unspecified site: Secondary | ICD-10-CM | POA: Diagnosis not present

## 2019-02-02 DIAGNOSIS — N183 Chronic kidney disease, stage 3 (moderate): Secondary | ICD-10-CM | POA: Diagnosis not present

## 2019-02-02 NOTE — Progress Notes (Signed)
Attempted to call patient, left a voicemail, patient did not answer phone on 2 occasions Pamela Pina, MD [place] 02/02/2019, 2:16 PM

## 2019-02-07 ENCOUNTER — Other Ambulatory Visit: Payer: Self-pay

## 2019-02-07 ENCOUNTER — Ambulatory Visit (INDEPENDENT_AMBULATORY_CARE_PROVIDER_SITE_OTHER): Payer: Medicare Other

## 2019-02-07 DIAGNOSIS — Z87891 Personal history of nicotine dependence: Secondary | ICD-10-CM

## 2019-02-07 DIAGNOSIS — W182XXD Fall in (into) shower or empty bathtub, subsequent encounter: Secondary | ICD-10-CM

## 2019-02-07 DIAGNOSIS — N183 Chronic kidney disease, stage 3 (moderate): Secondary | ICD-10-CM | POA: Diagnosis not present

## 2019-02-07 DIAGNOSIS — S301XXD Contusion of abdominal wall, subsequent encounter: Secondary | ICD-10-CM | POA: Diagnosis not present

## 2019-02-07 DIAGNOSIS — I129 Hypertensive chronic kidney disease with stage 1 through stage 4 chronic kidney disease, or unspecified chronic kidney disease: Secondary | ICD-10-CM | POA: Diagnosis not present

## 2019-02-07 DIAGNOSIS — R58 Hemorrhage, not elsewhere classified: Secondary | ICD-10-CM

## 2019-02-07 DIAGNOSIS — H9190 Unspecified hearing loss, unspecified ear: Secondary | ICD-10-CM

## 2019-02-07 DIAGNOSIS — F329 Major depressive disorder, single episode, unspecified: Secondary | ICD-10-CM

## 2019-02-07 DIAGNOSIS — K219 Gastro-esophageal reflux disease without esophagitis: Secondary | ICD-10-CM

## 2019-02-07 DIAGNOSIS — E785 Hyperlipidemia, unspecified: Secondary | ICD-10-CM

## 2019-02-07 DIAGNOSIS — Z9181 History of falling: Secondary | ICD-10-CM

## 2019-02-07 DIAGNOSIS — F419 Anxiety disorder, unspecified: Secondary | ICD-10-CM

## 2019-02-07 DIAGNOSIS — Z8673 Personal history of transient ischemic attack (TIA), and cerebral infarction without residual deficits: Secondary | ICD-10-CM

## 2019-02-07 DIAGNOSIS — M1990 Unspecified osteoarthritis, unspecified site: Secondary | ICD-10-CM

## 2019-02-07 DIAGNOSIS — J449 Chronic obstructive pulmonary disease, unspecified: Secondary | ICD-10-CM

## 2019-02-07 DIAGNOSIS — K52831 Collagenous colitis: Secondary | ICD-10-CM

## 2019-02-09 ENCOUNTER — Telehealth: Payer: Self-pay | Admitting: Family Medicine

## 2019-02-09 NOTE — Telephone Encounter (Signed)
I have already tried to do 2 different virtual appointments that she has felt to answer or call back on those days, you can schedule her only daily but only if she will come in because she does not answer the phone.

## 2019-02-20 NOTE — Telephone Encounter (Signed)
Multiple attempts made to contact patient.  This encounter will now be closed  

## 2019-02-27 ENCOUNTER — Telehealth: Payer: Self-pay

## 2019-03-21 ENCOUNTER — Ambulatory Visit (INDEPENDENT_AMBULATORY_CARE_PROVIDER_SITE_OTHER): Payer: Medicare Other | Admitting: Licensed Clinical Social Worker

## 2019-03-21 DIAGNOSIS — M19042 Primary osteoarthritis, left hand: Secondary | ICD-10-CM

## 2019-03-21 DIAGNOSIS — R0602 Shortness of breath: Secondary | ICD-10-CM

## 2019-03-21 DIAGNOSIS — M19041 Primary osteoarthritis, right hand: Secondary | ICD-10-CM

## 2019-03-21 DIAGNOSIS — I10 Essential (primary) hypertension: Secondary | ICD-10-CM

## 2019-03-21 DIAGNOSIS — E785 Hyperlipidemia, unspecified: Secondary | ICD-10-CM | POA: Diagnosis not present

## 2019-03-21 DIAGNOSIS — R42 Dizziness and giddiness: Secondary | ICD-10-CM

## 2019-03-21 DIAGNOSIS — R483 Visual agnosia: Secondary | ICD-10-CM

## 2019-03-21 DIAGNOSIS — F331 Major depressive disorder, recurrent, moderate: Secondary | ICD-10-CM | POA: Diagnosis not present

## 2019-03-21 DIAGNOSIS — Z8673 Personal history of transient ischemic attack (TIA), and cerebral infarction without residual deficits: Secondary | ICD-10-CM

## 2019-03-21 NOTE — Patient Instructions (Addendum)
Licensed Clinical Education officer, museum Visit Information  Goals we discussed today:  Goals   Current Barriers:   Chronic Mental Health needs related to depression  Suicidal Ideation/Homicidal Ideation: No  Clinical Social Work Goal(s):   Over the next 30 days, patient will work with SW  by telephone or in person to address needs related to food delivery options    Interventions:  Patient interviewed and appropriate assessments performed: GAD 7 and PHQ 9  LCSWpreviouslyprovided education to patient re: ADTS and Meals On Wheels (waiting list)  Previously encouraged client to call ADTS to add her name to waiting list for Meals on Wheels.  Previously discussed current food arrangements. She is signed up with [employer] in [place] and they deliver a food box. Her daughter and friend also brings things to her as needed.   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  Client said she has a friend, [name], who occasionally helps client with meals   Attends all scheduled provider appointments  Patient Coping Strengths:   Supportive Relationships  Family  Friends  Patient Self Care Deficits:   Lacks social connections occasionally with family members. Occasional family stress issues  Initial goal documentation       Materials Provided: No  Follow Up Plan: LCSW to call client in next 3 weeks to talk with client about food needs of client and talk with her about food resources in area  The patient verbalized understanding of instructions provided today and declined a print copy of patient instruction materials.   [name].Isiaih Hollenbach MSW, LCSW Licensed Clinical Social Worker [facility] [phone]

## 2019-03-21 NOTE — Chronic Care Management (AMB) (Signed)
  Care Management Note   Pamela Hughes is a 78 y.o. year old female who is a primary care patient of Dettinger, Pamela Kaufmann, MD. The CM team was consulted for assistance with chronic disease management and care coordination.   I reached out to Pamela Hughes by phone today.   Review of patient status, including review of consultants reports, relevant laboratory and other test results, and collaboration with appropriate care team members and the patient's provider was performed as part of comprehensive patient evaluation and provision of chronic care management services.   Social Determinants of Health: risk of stress; risk of physical inactivity; risk of social isolation; risk of tobacco use    Virtual BH Phone Follow Up from 08/02/2017 in [place]  PHQ-9 Total Score  10     GAD 7 : Generalized Anxiety Score 08/02/2017 07/12/2017  Nervous, Anxious, on Edge 0 0  Control/stop worrying 0 0  Worry too much - different things 0 0  Trouble relaxing 0 0  Restless 0 0  Easily annoyed or irritable 0 0  Afraid - awful might happen 0 0  Total GAD 7 Score 0 0   Medications   New medications from outside sources are available for reconciliation   amLODipine (NORVASC) 5 MG tablet    buPROPion (WELLBUTRIN SR) 150 MG 12 hr tablet    chlorthalidone (HYGROTON) 25 MG tablet    gabapentin (NEURONTIN) 100 MG capsule    ipratropium (ATROVENT) 0.03 % nasal spray    meclizine (ANTIVERT) 25 MG tablet    pantoprazole (PROTONIX) 40 MG tablet    rosuvastatin (CRESTOR) 20 MG tablet(Expired)    traZODone (DESYREL) 150 MG tablet    Current Barriers:  . Chronic Mental Health needs related to depression . Suicidal Ideation/Homicidal Ideation: No  Clinical Social Work Goal(s):  Marland Kitchen Over the next 30 days, patient will work with SW  by telephone or in person to address needs related to food delivery options    Interventions: . Patient interviewed and appropriate assessments performed: GAD 7  and PHQ 9  LCSW previously provided education to patient re: ADTS and Meals On Wheels (waiting list)  Previously encouraged client to call ADTS to add her name to waiting list for Meals on Wheels.   Previously discussed current food arrangements. She is signed up with the [employer] in [place] and they deliver a food box. Her daughter and friend also brings things to her as needed.   LCSW previously spoke with client about client food needs  . 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    Client said she has a friend, Pamela Hughes, who occasionally helps client with meals  . Attends all scheduled provider appointments  Patient Coping Strengths:  . Supportive Relationships . Family . Friends  Patient Self Care Deficits:  . Lacks social connections occasionally with family members. Occasional family stress issues  Initial goal documentation  . Follow Up Plan:LCSW to call client in next 3 weeks to talk with client about food needs of client and about food resources in the area   [employer].Pamela Hughes MSW, LCSW Licensed Clinical Social Worker [facility] [phone]

## 2019-04-02 ENCOUNTER — Other Ambulatory Visit: Payer: Self-pay | Admitting: Family Medicine

## 2019-04-11 ENCOUNTER — Ambulatory Visit (INDEPENDENT_AMBULATORY_CARE_PROVIDER_SITE_OTHER): Payer: Medicare Other | Admitting: Licensed Clinical Social Worker

## 2019-04-11 DIAGNOSIS — R42 Dizziness and giddiness: Secondary | ICD-10-CM

## 2019-04-11 DIAGNOSIS — R0602 Shortness of breath: Secondary | ICD-10-CM

## 2019-04-11 DIAGNOSIS — I10 Essential (primary) hypertension: Secondary | ICD-10-CM

## 2019-04-11 DIAGNOSIS — R483 Visual agnosia: Secondary | ICD-10-CM

## 2019-04-11 DIAGNOSIS — Z8673 Personal history of transient ischemic attack (TIA), and cerebral infarction without residual deficits: Secondary | ICD-10-CM

## 2019-04-11 DIAGNOSIS — F331 Major depressive disorder, recurrent, moderate: Secondary | ICD-10-CM | POA: Diagnosis not present

## 2019-04-11 DIAGNOSIS — E785 Hyperlipidemia, unspecified: Secondary | ICD-10-CM

## 2019-04-11 DIAGNOSIS — M19042 Primary osteoarthritis, left hand: Secondary | ICD-10-CM | POA: Diagnosis not present

## 2019-04-11 DIAGNOSIS — M19041 Primary osteoarthritis, right hand: Secondary | ICD-10-CM | POA: Diagnosis not present

## 2019-04-11 NOTE — Patient Instructions (Addendum)
Licensed Clinical Social Worker Visit Information  Goals we discussed today:  Goals    . "I need help with getting food delivered to my home." (pt-stated)     Current Barriers:  Marland Kitchen Knowledge Deficits related to resources available to deliver food to her home.  . Nurse Case Manager Clinical Goal(s): Over the next 7 days, patient will work with LCSW to address needs related to food delivery options.   Interventions:  . Previously,LCSW provided education to patient re: ADTS and Meals On Wheels (waiting list) . Previously, encouraged client to call ADTS to add her name to waiting list for Meals on Wheels.  . Discussed current food arrangements. She is signed up with the [employer] in [place] and they deliver a food box. Her daughter and friend also brings things to her as needed.  Marland Kitchen LCSW spoke with client about client food needs  Talked with client about upcoming appointments.   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    Client said she has a friend, [name], who occasionally helps client with meals   Plan: LCSW to call client in next 3 weeks to talk with client about food needs of client and about local food resources Client to participate in home health physical therapy sessions as scheduled Client to communicate with RN CM [name] to discuss nursing needs of client. Client to attend scheduled client medical appointments  Initial goal documentation            Materials Provided: No  Follow Up Plan: LCSW to call client in next 3 weeks to talk with client about food needs of client and to talk with client about local food resources  The patient verbalized understanding of instructions provided today and declined a print copy of patient instruction materials.   [name].Lariza Cothron MSW, LCSW Licensed Clinical Social  Worker [facility] [phone]

## 2019-04-11 NOTE — Chronic Care Management (AMB) (Signed)
Care Management Note   Pamela Hughes is a 78 y.o. year old female who is a primary care patient of Dettinger, Pamela Kaufmann, MD. The CM team was consulted for assistance with chronic disease management and care coordination.   I reached out to Pamela Hughes by phone today.    Review of patient status, including review of consultants reports, relevant laboratory and other test results, and collaboration with appropriate care team members and the patient's provider was performed as part of comprehensive patient evaluation and provision of chronic care management services.  Social determinants of health: risk of social isolation;risk of tobacco use; risk of stress; risk of physical inactivity    Virtual BH Phone Follow Up from 08/02/2017 in [place]  PHQ-9 Total Score  10     GAD 7 : Generalized Anxiety Score 08/02/2017 07/12/2017  Nervous, Anxious, on Edge 0 0  Control/stop worrying 0 0  Worry too much - different things 0 0  Trouble relaxing 0 0  Restless 0 0  Easily annoyed or irritable 0 0  Afraid - awful might happen 0 0  Total GAD 7 Score 0 0     Medications   New medications from outside sources are available for reconciliation   amLODipine (NORVASC) 5 MG tablet    buPROPion (WELLBUTRIN SR) 150 MG 12 hr tablet    chlorthalidone (HYGROTON) 25 MG tablet    gabapentin (NEURONTIN) 100 MG capsule    ipratropium (ATROVENT) 0.03 % nasal spray    meclizine (ANTIVERT) 25 MG tablet    pantoprazole (PROTONIX) 40 MG tablet    rosuvastatin (CRESTOR) 20 MG tablet(Expired)    traZODone (DESYREL) 150 MG tablet      Goals    . "I need help with getting food delivered to my home." (pt-stated)     Current Barriers:  Marland Kitchen Knowledge Deficits related to resources available to deliver food to her home.  . Nurse Case Manager Clinical Goal(s): Over the next 7 days, patient will work with LCSW to address needs related to food delivery options.   Interventions:  . Previously  LCSW provided education to patient re: ADTS and Meals On Wheels (waiting list) . Previously encouraged client to call ADTS to add her name to waiting list for Meals on Wheels.  . Discussed current food arrangements. She is signed up with [employer] in [place] and they deliver a food box. Her daughter and friend also brings things to her as needed.  Marland Kitchen LCSW spoke with client about client food needs . Talked with client about upcoming appointments.  . 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    Client said she has a friend, Pamela Hughes, who occasionally helps client with meals   Plan: LCSW to call client in next 3 weeks to talk with client about food needs of client and about local food resources Client to participate in home health physical therapy sessions as scheduled Client to communicate with RN CM Pamela Hughes to discuss nursing needs of client. Client to attend scheduled client medical appointments  Initial goal documentation        Follow Up Plan: LCSW to call client in the next 3 weeks to talk with client about food needs of client and about food resources in the area.  Pamela Hughes.Pamela Hughes MSW, [employer] Licensed  Clinical Social Worker [facility] [phone] 

## 2019-04-24 ENCOUNTER — Telehealth: Payer: Self-pay | Admitting: Cardiovascular Disease

## 2019-04-24 NOTE — Telephone Encounter (Signed)
°  Precert needed for:  Carotid    Location: [facility]    Date: May 22, 2019

## 2019-04-24 NOTE — Telephone Encounter (Signed)
Virtual Visit Pre-Appointment Phone Call  "(Name), I am calling you today to discuss your upcoming appointment. We are currently trying to limit exposure to the virus that causes COVID-19 by seeing patients at home rather than in the office."  1. "What is the BEST phone number to call the day of the visit?" - include this in appointment notes  2. Do you have or have access to (through a family member/friend) a smartphone with video capability that we can use for your visit?" a. If yes - list this number in appt notes as cell (if different from BEST phone #) and list the appointment type as a VIDEO visit in appointment notes b. If no - list the appointment type as a PHONE visit in appointment notes  3. Confirm consent - "In the setting of the current Covid19 crisis, you are scheduled for a (phone or video) visit with your provider on (date) at (time).  Just as we do with many in-office visits, in order for you to participate in this visit, we must obtain consent.  If you'd like, I can send this to your mychart (if signed up) or email for you to review.  Otherwise, I can obtain your verbal consent now.  All virtual visits are billed to your insurance company just like a normal visit would be.  By agreeing to a virtual visit, we'd like you to understand that the technology does not allow for your provider to perform an examination, and thus may limit your provider's ability to fully assess your condition. If your provider identifies any concerns that need to be evaluated in person, we will make arrangements to do so.  Finally, though the technology is pretty good, we cannot assure that it will always work on either your or our end, and in the setting of a video visit, we may have to convert it to a phone-only visit.  In either situation, we cannot ensure that we have a secure connection.  Are you willing to proceed?" STAFF: Did the patient verbally acknowledge consent to telehealth visit? Document  YES/NO here: yes  4. Advise patient to be prepared - "Two hours prior to your appointment, go ahead and check your blood pressure, pulse, oxygen saturation, and your weight (if you have the equipment to check those) and write them all down. When your visit starts, your provider will ask you for this information. If you have an Apple Watch or Kardia device, please plan to have heart rate information ready on the day of your appointment. Please have a pen and paper handy nearby the day of the visit as well."  5. Give patient instructions for MyChart download to smartphone OR Doximity/Doxy.me as below if video visit (depending on what platform provider is using)  6. Inform patient they will receive a phone call 15 minutes prior to their appointment time (may be from unknown caller ID) so they should be prepared to answer    TELEPHONE CALL NOTE  Pamela Hughes has been deemed a candidate for a follow-up tele-health visit to limit community exposure during the Covid-19 pandemic. I spoke with the patient via phone to ensure availability of phone/video source, confirm preferred email & phone number, and discuss instructions and expectations.  I reminded Pamela Hughes to be prepared with any vital sign and/or heart rhythm information that could potentially be obtained via home monitoring, at the time of her visit. I reminded Pamela Hughes to expect a phone call prior to  her visit.  Pamela Hughes 04/24/2019 1:01 PM   INSTRUCTIONS FOR DOWNLOADING THE MYCHART APP TO SMARTPHONE  - The patient must first make sure to have activated MyChart and know their login information - If Apple, go to [employer] and type in MyChart in the search bar and download the app. If Android, ask patient to go to [employer] and type in [place] in the search bar and download the app. The app is free but as with any other app downloads, their phone may require them to verify saved payment information or Apple/Android  password.  - The patient will need to then log into the app with their MyChart username and password, and select [facility] as their healthcare provider to link the account. When it is time for your visit, go to the MyChart app, find appointments, and click Begin Video Visit. Be sure to Select Allow for your device to access the Microphone and Camera for your visit. You will then be connected, and your provider will be with you shortly.  **If they have any issues connecting, or need assistance please contact MyChart service desk [phone])**  **If using a computer, in order to ensure the best quality for their visit they will need to use either of the following Internet Browsers: [employer] or Google Chrome**  IF USING DOXIMITY or DOXY.ME - The patient will receive a link just prior to their visit by text.     FULL LENGTH CONSENT FOR TELE-HEALTH VISIT   I hereby voluntarily request, consent and authorize [facility] and its employed or contracted physicians, physician assistants, nurse practitioners or other licensed health care professionals (the Practitioner), to provide me with telemedicine health care services (the Services") as deemed necessary by the treating Practitioner. I acknowledge and consent to receive the Services by the Practitioner via telemedicine. I understand that the telemedicine visit will involve communicating with the Practitioner through live audiovisual communication technology and the disclosure of certain medical information by electronic transmission. I acknowledge that I have been given the opportunity to request an in-person assessment or other available alternative prior to the telemedicine visit and am voluntarily participating in the telemedicine visit.  I understand that I have the right to withhold or withdraw my consent to the use of telemedicine in the course of my care at any time, without affecting my right to future care or treatment,  and that the Practitioner or I may terminate the telemedicine visit at any time. I understand that I have the right to inspect all information obtained and/or recorded in the course of the telemedicine visit and may receive copies of available information for a reasonable fee.  I understand that some of the potential risks of receiving the Services via telemedicine include:   Delay or interruption in medical evaluation due to technological equipment failure or disruption;  Information transmitted may not be sufficient (e.g. poor resolution of images) to allow for appropriate medical decision making by the Practitioner; and/or   In rare instances, security protocols could fail, causing a breach of personal health information.  Furthermore, I acknowledge that it is my responsibility to provide information about my medical history, conditions and care that is complete and accurate to the best of my ability. I acknowledge that Practitioner's advice, recommendations, and/or decision may be based on factors not within their control, such as incomplete or inaccurate data provided by me or distortions of diagnostic images or specimens that may result from electronic transmissions. I  understand that the practice of medicine is not an exact science and that Practitioner makes no warranties or guarantees regarding treatment outcomes. I acknowledge that I will receive a copy of this consent concurrently upon execution via email to the email address I last provided but may also request a printed copy by calling the office of [facility].    I understand that my insurance will be billed for this visit.   I have read or had this consent read to me.  I understand the contents of this consent, which adequately explains the benefits and risks of the Services being provided via telemedicine.   I have been provided ample opportunity to ask questions regarding this consent and the Services and have had my questions  answered to my satisfaction.  I give my informed consent for the services to be provided through the use of telemedicine in my medical care  By participating in this telemedicine visit I agree to the above.

## 2019-05-03 ENCOUNTER — Ambulatory Visit (INDEPENDENT_AMBULATORY_CARE_PROVIDER_SITE_OTHER): Payer: Medicare Other | Admitting: Licensed Clinical Social Worker

## 2019-05-03 DIAGNOSIS — I10 Essential (primary) hypertension: Secondary | ICD-10-CM | POA: Diagnosis not present

## 2019-05-03 DIAGNOSIS — E785 Hyperlipidemia, unspecified: Secondary | ICD-10-CM | POA: Diagnosis not present

## 2019-05-03 DIAGNOSIS — M19041 Primary osteoarthritis, right hand: Secondary | ICD-10-CM | POA: Diagnosis not present

## 2019-05-03 DIAGNOSIS — Z8673 Personal history of transient ischemic attack (TIA), and cerebral infarction without residual deficits: Secondary | ICD-10-CM

## 2019-05-03 DIAGNOSIS — R483 Visual agnosia: Secondary | ICD-10-CM

## 2019-05-03 DIAGNOSIS — M19042 Primary osteoarthritis, left hand: Secondary | ICD-10-CM

## 2019-05-03 DIAGNOSIS — R42 Dizziness and giddiness: Secondary | ICD-10-CM

## 2019-05-03 DIAGNOSIS — R0602 Shortness of breath: Secondary | ICD-10-CM

## 2019-05-03 NOTE — Chronic Care Management (AMB) (Signed)
  Care Management Note   Pamela Hughes is a 78 y.o. year old female who is a primary care patient of Dettinger, Fransisca Kaufmann, MD. The CM team was consulted for assistance with chronic disease management and care coordination.   I reached out to [name] by phone today.    Review of patient status, including review of consultants reports, relevant laboratory and other test results, and collaboration with appropriate care team members and the patient's provider was performed as part of comprehensive patient evaluation and provision of chronic care management services.   Social determinants of health: risk of social isolation; risk of tobacco use; risk of stress; risk of physical inactivity    Virtual BH Phone Follow Up from 08/02/2017 in [place]  PHQ-9 Total Score  10     GAD 7 : Generalized Anxiety Score 08/02/2017 07/12/2017  Nervous, Anxious, on Edge 0 0  Control/stop worrying 0 0  Worry too much - different things 0 0  Trouble relaxing 0 0  Restless 0 0  Easily annoyed or irritable 0 0  Afraid - awful might happen 0 0  Total GAD 7 Score 0 0    Medications   New medications from outside sources are available for reconciliation   amLODipine (NORVASC) 5 MG tablet    buPROPion (WELLBUTRIN SR) 150 MG 12 hr tablet    chlorthalidone (HYGROTON) 25 MG tablet    gabapentin (NEURONTIN) 100 MG capsule    ipratropium (ATROVENT) 0.03 % nasal spray    meclizine (ANTIVERT) 25 MG tablet    pantoprazole (PROTONIX) 40 MG tablet    rosuvastatin (CRESTOR) 20 MG tablet(Expired)    traZODone (DESYREL) 150 MG tablet    Goals    . "I need help with getting food delivered to my home." (pt-stated)     Current Barriers:  Marland Kitchen Knowledge Deficits related to resources available to deliver food to her home.  . Nurse Case Manager Clinical Goal(s): Over the next 7 days, patient will work with LCSW to address needs related to food delivery options.   Interventions:  . LCSW provided  education to patient re: ADTS and Meals On Wheels (waiting list) . Discussed current food arrangements. She is signed up with the [employer] in [place] and they deliver a food box. Her daughter and friend also brings things to her as needed.  Marland Kitchen LCSW talked with client about food needs of client . 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    Client said she has a friend, [name], who occasionally helps client with meals   Plan: LCSW to call client in next 3 weeks to talk with client about food needs of client and about local food resources Client to participate in home health physical therapy sessions as scheduled Client to communicate with RN CM [name] to discuss nursing needs of client. Client to attend scheduled client medical appointments  Initial goal documentation      Follow Up Plan: LCSW to call client in next 3 weeks to talk with client about the food needs of client and about local food resources  [name].Sinaya Minogue MSW, LCSW Licensed Clinical Social Worker [facility] [phone]

## 2019-05-03 NOTE — Patient Instructions (Addendum)
Licensed Clinical Social Worker Visit Information  Goals we discussed today:  Goals    . "I need help with getting food delivered to my home." (pt-stated)     Current Barriers:  Marland Kitchen Knowledge Deficits related to resources available to deliver food to her home.  . Nurse Case Manager Clinical Goal(s): Over the next 7 days, patient will work with LCSW to address needs related to food delivery options.   Interventions:  . LCSW provided education to patient re: ADTS and Meals On Wheels (waiting list) . Encouraged client to call ADTS to add her name to waiting list for Meals on Wheels.  . Discussed current food arrangements. She is signed up with the [employer] in [place] and they deliver a food box. Her daughter and friend also brings things to her as needed.  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    Client said she has a friend, [name], who occasionally helps client with meals   Plan: LCSW to call client in next 3 weeks to talk with client about food needs of client and about local food resources Client to participate in home health physical therapy sessions as scheduled Client to communicate with RN CM [name] to discuss nursing needs of client. Client to attend scheduled client medical appointments  Initial goal documentation             Materials Provided: No   Follow Up Plan: LCSW to call client in next 3 weeks to talk with client about food needs of client and about local food resources  The patient verbalized understanding of instructions provided today and declined a print copy of patient instruction materials.   [name].Victorious Kundinger MSW, LCSW Licensed Clinical Social Worker [facility]  [facility] [phone]

## 2019-05-08 ENCOUNTER — Other Ambulatory Visit: Payer: Self-pay

## 2019-05-08 NOTE — Patient Outreach (Signed)
[facility]) Care Management  05/08/2019  Pamela Hughes 01-13-1941 [id number]   Medication Adherence call to Pamela Hughes [employer] [facility] spoke with patient she is past due on Rosuvastatin 20 mg,patient explain she takes 1 tablet daily but she remember that doctor had ask her to stop the medication reason why she still has some,patient is taking this medication on a regular basis and has medication at this time. Pamela Hughes is showing past due under [place].   [place] Management Direct Dial [phone]  Fax [phone] [email or web address]

## 2019-05-22 ENCOUNTER — Other Ambulatory Visit: Payer: Self-pay | Admitting: *Deleted

## 2019-05-22 DIAGNOSIS — I6523 Occlusion and stenosis of bilateral carotid arteries: Secondary | ICD-10-CM

## 2019-05-23 ENCOUNTER — Telehealth: Payer: Medicare Other | Admitting: Cardiovascular Disease

## 2019-05-29 ENCOUNTER — Ambulatory Visit: Payer: Self-pay | Admitting: Licensed Clinical Social Worker

## 2019-05-29 DIAGNOSIS — F331 Major depressive disorder, recurrent, moderate: Secondary | ICD-10-CM

## 2019-05-29 DIAGNOSIS — Z8673 Personal history of transient ischemic attack (TIA), and cerebral infarction without residual deficits: Secondary | ICD-10-CM

## 2019-05-29 DIAGNOSIS — I10 Essential (primary) hypertension: Secondary | ICD-10-CM

## 2019-05-29 DIAGNOSIS — R0602 Shortness of breath: Secondary | ICD-10-CM

## 2019-05-29 NOTE — Chronic Care Management (AMB) (Signed)
Care Management Note   Pamela Hughes is a 78 y.o. year old female who is a primary care patient of Dettinger, Fransisca Kaufmann, MD. The CM team was consulted for assistance with chronic disease management and care coordination.   I reached out to Pamela Hughes by phone today.    Review of patient status, including review of consultants reports, relevant laboratory and other test results, and collaboration with appropriate care team members and the patient's provider was performed as part of comprehensive patient evaluation and provision of chronic care management services.   Social determinants of health:risk of stress; risk of tobacco use    Virtual BH Phone Follow Up from 08/02/2017 in [place]  PHQ-9 Total Score  10      GAD 7 : Generalized Anxiety Score 08/02/2017 07/12/2017  Nervous, Anxious, on Edge 0 0  Control/stop worrying 0 0  Worry too much - different things 0 0  Trouble relaxing 0 0  Restless 0 0  Easily annoyed or irritable 0 0  Afraid - awful might happen 0 0  Total GAD 7 Score 0 0   Medications   (very important)  New medications from outside sources are available for reconciliation  amLODipine (NORVASC) 5 MG tablet buPROPion (WELLBUTRIN SR) 150 MG 12 hr tablet chlorthalidone (HYGROTON) 25 MG tablet gabapentin (NEURONTIN) 100 MG capsule ipratropium (ATROVENT) 0.03 % nasal spray meclizine (ANTIVERT) 25 MG tablet pantoprazole (PROTONIX) 40 MG tablet rosuvastatin (CRESTOR) 20 MG tablet(Expired) traZODone (DESYREL) 150 MG tablet  Goals Addressed            This Visit's Progress   . "I need help with getting food delivered to my home." (pt-stated)       Current Barriers:  Marland Kitchen Knowledge Deficits related to resources available to deliver food to her home.in patient with Chronic Diagnoses of HTN, Depression, DOE , and CVA  . Nurse Case Manager Clinical Goal(s): Over the next 7 days, patient will work with LCSW to address needs related to food  delivery options.   Interventions:  . LCSW provided education to patient re: ADTS and Meals On Wheels (waiting list) . Encouraged client to call ADTS to add her name to waiting list for Meals on Wheels.  . Discussed current food arrangements. She is signed up with [employer] in [place] and they deliver a food box. Her daughter and friend also brings things to her as needed.  Marland Kitchen LCSW spoke with client about client food needs . Talked with client about vision challenges of client . Talked with client about ambulation needs (she said she gets dizzy on occasion) . Talked with client about transport needs of client (talked with client about [facility] services)  Patient Self Care Activities:  Currently UNABLE TO independently drive due to dizziness s/p CVA but she is able to warm up meals and feed herself.    Client said she has a friend, Pamela Hughes, who occasionally helps client with meals   Plan: LCSW to call client in next 4 weeks to talk with client about food needs of client and about local food resources Client to participate in home health physical therapy sessions as scheduled Client to communicate with RN CM Pamela Hughes to discuss nursing needs of client. Client to attend scheduled client medical appointments  Initial goal documentation        Follow Up Plan: LCSW to call client in next 4 weeks to talk with client about food needs of client and about resources  for food in the area  [employer].Pamela Hughes MSW, LCSW Licensed Clinical Social Worker [facility] Care Management [phone]

## 2019-05-29 NOTE — Patient Instructions (Addendum)
Licensed Clinical Social Worker Visit Information  Goals we discussed today:  Goals Addressed            This Visit's Progress   . "I need help with getting food delivered to my home." (pt-stated)       Current Barriers:  Marland Kitchen Knowledge Deficits related to resources available to deliver food to her home.in patient with Chronic Diagnoses of HTN, Depression, DOE , and CVA  . Nurse Case Manager Clinical Goal(s): Over the next 7 days, patient will work with LCSW to address needs related to food delivery options.   Interventions:  LCSW provided education to patient re: ADTS and Meals On Wheels (waiting list)  Encouraged client to call ADTS to add her name to waiting list for Meals on Wheels.  Discussed current food arrangements. She is signed up with [employer] in [place] and they deliver a food box. Her daughter and friend also brings things to her as needed.  LCSW spoke with client about client food needs  Talked with client about vision challenges of client  Talked with client about ambulation needs (she said she gets dizzy on occasion)  Talked with client about transport needs of client (talked with client about [facility] services)  Patient Self Care Activities:  Currently UNABLE TO independently drive due to dizziness s/p CVA but she is able to warm up meals and feed herself.    Client said she has a friend, [name], who occasionally helps client with meals   Plan: LCSW to call client in next 4 weeks to talk with client about food needs of client and about local food resources Client to participate in home health physical therapy sessions as scheduled Client to communicate with RN CM [name] to discuss nursing needs of client. Client to attend scheduled client medical appointments  Initial goal documentation        Materials Provided: No  Follow Up Plan: LCSW to call client in next 4 weeks to talk with client about food needs of client and about local food resources  The patient  verbalized understanding of instructions provided today and declined a print copy of patient instruction materials.   [name].Rue Tinnel MSW, LCSW Licensed Clinical Social Worker [facility] Care Management [phone]

## 2019-06-04 ENCOUNTER — Other Ambulatory Visit: Payer: Self-pay | Admitting: Family Medicine

## 2019-06-04 ENCOUNTER — Other Ambulatory Visit: Payer: Self-pay | Admitting: Internal Medicine

## 2019-06-07 ENCOUNTER — Encounter: Payer: Self-pay | Admitting: Cardiovascular Disease

## 2019-06-07 ENCOUNTER — Telehealth (INDEPENDENT_AMBULATORY_CARE_PROVIDER_SITE_OTHER): Payer: Medicare Other | Admitting: Cardiovascular Disease

## 2019-06-07 VITALS — BP 140/72 | HR 70 | Ht 65.0 in | Wt 135.0 lb

## 2019-06-07 DIAGNOSIS — I639 Cerebral infarction, unspecified: Secondary | ICD-10-CM

## 2019-06-07 DIAGNOSIS — I10 Essential (primary) hypertension: Secondary | ICD-10-CM

## 2019-06-07 DIAGNOSIS — I350 Nonrheumatic aortic (valve) stenosis: Secondary | ICD-10-CM | POA: Diagnosis not present

## 2019-06-07 DIAGNOSIS — I6523 Occlusion and stenosis of bilateral carotid arteries: Secondary | ICD-10-CM

## 2019-06-07 DIAGNOSIS — E785 Hyperlipidemia, unspecified: Secondary | ICD-10-CM

## 2019-06-07 NOTE — Addendum Note (Signed)
Addended by: Lesle Chris on: 06/07/2019 04:24 PM   Modules accepted: Orders

## 2019-06-07 NOTE — Progress Notes (Signed)
Virtual Visit via Telephone Note   This visit type was conducted due to national recommendations for restrictions regarding the COVID-19 Pandemic (e.g. social distancing) in an effort to limit this patient's exposure and mitigate transmission in our community.  Due to her co-morbid illnesses, this patient is at least at moderate risk for complications without adequate follow up.  This format is felt to be most appropriate for this patient at this time.  The patient did not have access to video technology/had technical difficulties with video requiring transitioning to audio format only (telephone).  All issues noted in this document were discussed and addressed.  No physical exam could be performed with this format.  Please refer to the patient's chart for her  consent to telehealth for [facility].   Date:  06/07/2019   ID:  Pamela Hughes, DOB 19-Aug-1940, MRN [id number]  Patient Location: Home Provider Location: Office  PCP:  Dettinger, Elige Radon, MD  Cardiologist:  Prentice Docker, MD  Electrophysiologist:  None   Evaluation Performed:  Follow-Up Visit  Chief Complaint: Aortic stenosis  History of Present Illness:    Pamela Hughes is a 79 y.o. female with a history of aortic stenosis and large right occipital CVA.  She has dizziness ever since her stroke. She also has some visual problems.  She denies chest pain, palpitations, and shortness of breath. She has some chronic right leg swelling.  She is no longer on ASA or Plavix due to bleeding issues.   Past Medical History:  Diagnosis Date  . Allergy   . Alopecia   . Anxiety   . Arthritis   . Asthma   . Collagenous colitis   . Depression   . Diarrhea    chronic- "states for at least a year)  . Edema   . GERD (gastroesophageal reflux disease)   . HOH (hard of hearing)   . Hypertension    Past Surgical History:  Procedure Laterality Date  . ABDOMINAL HYSTERECTOMY     one ovary left  . CATARACT EXTRACTION W/PHACO  Right 02/17/2018   Procedure: CATARACT EXTRACTION PHACO AND INTRAOCULAR LENS PLACEMENT (IOC);  Surgeon: Fabio Pierce, MD;  Location: [facility] OR[facility];  Service: Ophthalmology;  Laterality: Right;  CDE: 4.84  . CATARACT EXTRACTION W/PHACO Left 03/31/2018   Procedure: CATARACT EXTRACTION PHACO AND INTRAOCULAR LENS PLACEMENT (IOC) LEFT CDE: 4.51;  Surgeon: Fabio Pierce, MD;  Location: [facility] ORS;  Service: Ophthalmology;  Laterality: Left;  . COLONOSCOPY N/A 12/17/2016   Procedure: COLONOSCOPY;  Surgeon: Malissa Hippo, MD;  Location: [facility] ENDO SUITE;  Service: Endoscopy;  Laterality: N/A;  2:05  . SPINE SURGERY  1990s   L spine     Current Meds  Medication Sig  . amLODipine (NORVASC) 5 MG tablet Take 1 tablet (5 mg total) by mouth daily.  Marland Kitchen buPROPion (WELLBUTRIN SR) 150 MG 12 hr tablet Take 1 tablet (150 mg total) by mouth 2 (two) times daily. (Needs to be seen before next refill) (Patient taking differently: Take 150 mg by mouth daily. )  . gabapentin (NEURONTIN) 100 MG capsule Take 1 capsule (100 mg total) by mouth 3 (three) times daily. One three times daily  . ipratropium (ATROVENT) 0.03 % nasal spray USE TWICE DAILY AS DIRECTED  . meclizine (ANTIVERT) 25 MG tablet TAKE ONE TABLET 3 TIMES A DAY AS NEEDED. (Patient taking differently: Take 25 mg by mouth 3 (three) times daily as needed for dizziness. )  . pantoprazole (PROTONIX) 40 MG tablet TAKE  ONE TABLET EACH MORNING BEFORE BREAKFAST  . traZODone (DESYREL) 150 MG tablet TAKE ONE TABLET DAILY AT BEDTIME (Patient taking differently: Take 150 mg by mouth at bedtime. )     Allergies:   Patient has no known allergies.   Social History   Tobacco Use  . Smoking status: Former Smoker    Packs/day: 0.25    Years: 5.00    Pack years: 1.25    Types: Cigarettes    Quit date: 10/09/1974    Years since quitting: 44.6  . Smokeless tobacco: Never Used  Substance Use Topics  . Alcohol use: Yes    Hughes: glass of wine occasionally  . Drug use: No      Family Hx: The patient's family history includes Alcohol abuse in her father; Alzheimer's disease in her mother; Diabetes in her brother; Healthy in her daughter.  ROS:   Please see the history of present illness.     All other systems reviewed and are negative.   Prior CV studies:   The following studies were reviewed today:  Echo (09/22/18):  IMPRESSIONS   1. The left ventricle has normal systolic function with an ejection fraction of 60-65%. The cavity size was normal. There is mild concentric left ventricular hypertrophy. Left ventricular diastolic Doppler parameters are indeterminate. Indeterminate  filling pressures No evidence of left ventricular regional wall motion abnormalities. 2. The right ventricle has normal systolic function. The cavity was normal. There is no increase in right ventricular wall thickness. 3. The mitral valve is grossly normal. 4. The tricuspid valve is grossly normal. 5. The aortic valve is tricuspid. Mild thickening of the aortic valve. Mild calcification of the aortic valve. Aortic valve regurgitation is mild by color flow Doppler. Mild stenosis of the aortic valve. 6. Aortic annular calcification. 7. The aortic root is normal in size and structure. 8. No atrial level shunt detected by color flow Doppler. Agitated saline contrast was given intravenously to evaluate for intracardiac shunting. Saline contrast bubble study was negative, with no evidence of any interatrial shunt.  Carotid Doppler (09/22/18):  IMPRESSION: Less than 50% stenosis in the right and left internal carotid arteries.  Labs/Other Tests and Data Reviewed:    EKG:  No ECG reviewed.  Recent Labs: 12/16/2018: Magnesium 1.9 12/22/2018: ALT 126; BUN 40; Creatinine, Ser 1.44; Hemoglobin 8.9; Platelets 565; Potassium 3.9; Sodium 132   Recent Lipid Panel Lab Results  Component Value Date/Time   CHOL 166 09/22/2018 04:32 AM   CHOL 269 (H) 07/11/2017 03:55 PM   TRIG 41  09/22/2018 04:32 AM   HDL 97 09/22/2018 04:32 AM   HDL 105 07/11/2017 03:55 PM   CHOLHDL 1.7 09/22/2018 04:32 AM   LDLCALC 61 09/22/2018 04:32 AM   LDLCALC 145 (H) 07/11/2017 03:55 PM   LDLDIRECT 165 (H) 07/11/2017 03:55 PM    Wt Readings from Last 3 Encounters:  06/07/19 135 lb (61.2 kg)  12/22/18 140 lb (63.5 kg)  12/15/18 148 lb 13 oz (67.5 kg)     Objective:    Vital Signs:  BP 140/72   Pulse 70   Ht 5\' 5"  (1.651 m)   Wt 135 lb (61.2 kg)   BMI 22.47 kg/m    VITAL SIGNS:  reviewed  ASSESSMENT & PLAN:    1. Aortic stenosis: Mild in severity by echo on 09/22/18. Will plan to repeat in April 2022.  2. Hypertension: Blood pressure is  borderline elevated.  No changes to therapy.  3. Bilateral carotid artery stenosis:  <  50% b/l on 09/22/18 Dopplers. On statin.  4. Hypercholesterolemia: Continue statin.LDL 61 on 09/22/18.  5. CVA: She is no longer on ASA or Plavix due to bleeding issues. On rosuvastatin 20 mg  COVID-19 Education: The signs and symptoms of COVID-19 were discussed with the patient and how to seek care for testing (follow up with PCP or arrange E-visit).  The importance of social distancing was discussed today.  Time:   Today, I have spent 10 minutes with the patient with telehealth technology discussing the above problems.     Medication Adjustments/Labs and Tests Ordered: Current medicines are reviewed at length with the patient today.  Concerns regarding medicines are outlined above.   Tests Ordered: No orders of the defined types were placed in this encounter.   Medication Changes: No orders of the defined types were placed in this encounter.   Follow Up:  Virtual Visit  in 1 year(s)  Signed, Prentice Docker, MD  06/07/2019 11:57 AM    [facility]

## 2019-06-07 NOTE — Patient Instructions (Addendum)
Medication Instructions:  Crestor refilled today.  Continue all other medications.    Labwork: none  Testing/Procedures:  Carotid doppler has been cancelled for this April 2021.  You do not need to do till April 2022 per Dr. Purvis Sheffield.    We will send a recall letter when time to do this test again.    Follow-Up: Your physician wants you to follow up in:  1 year.  You will receive a reminder letter in the mail one-two months in advance.  If you don't receive a letter, please call our office to schedule the follow up appointment   Any Other Special Instructions Will Be Listed Below (If Applicable).  If you need a refill on your cardiac medications before your next appointment, please call your pharmacy.

## 2019-06-08 ENCOUNTER — Other Ambulatory Visit: Payer: Self-pay | Admitting: Family Medicine

## 2019-06-20 ENCOUNTER — Telehealth: Payer: Self-pay | Admitting: *Deleted

## 2019-06-20 NOTE — Telephone Encounter (Signed)
Left message to return call 

## 2019-06-20 NOTE — Telephone Encounter (Signed)
-----   Message from Laqueta Linden, MD sent at 06/08/2019  9:11 AM EST ----- Regarding: RE: clarify dose 10 mg ----- Message ----- From: Lesle Chris, LPN Sent: 01/05/8675   4:24 PM EST To: Laqueta Linden, MD Subject: clarify dose                                   Please clarify if she is to be on Crestor 10 or 20mg  ???  Both was on her list.    Thanks,  

## 2019-06-20 NOTE — Telephone Encounter (Signed)
Pt verified she was taking 10 mg of Crestor - updated medication list

## 2019-06-26 ENCOUNTER — Ambulatory Visit (INDEPENDENT_AMBULATORY_CARE_PROVIDER_SITE_OTHER): Payer: Medicare Other | Admitting: Licensed Clinical Social Worker

## 2019-06-26 DIAGNOSIS — F331 Major depressive disorder, recurrent, moderate: Secondary | ICD-10-CM | POA: Diagnosis not present

## 2019-06-26 DIAGNOSIS — Z8673 Personal history of transient ischemic attack (TIA), and cerebral infarction without residual deficits: Secondary | ICD-10-CM

## 2019-06-26 DIAGNOSIS — I10 Essential (primary) hypertension: Secondary | ICD-10-CM | POA: Diagnosis not present

## 2019-06-26 DIAGNOSIS — R0609 Other forms of dyspnea: Secondary | ICD-10-CM

## 2019-06-26 NOTE — Chronic Care Management (AMB) (Signed)
Care Management Note   Pamela Hughes is a 79 y.o. year old female who is a primary care patient of Dettinger, Elige Radon, MD. The CM team was consulted for assistance with chronic disease management and care coordination.   I reached out to [name] by phone today.   Review of patient status, including review of consultants reports, relevant laboratory and other test results, and collaboration with appropriate care team members and the patient's provider was performed as part of comprehensive patient evaluation and provision of chronic care management services.   Social determinants of health: risk of tobacco use; risk of depression; risk of stress;    Virtual BH Phone Follow Up from 08/02/2017 in [facility]  PHQ-9 Total Score  10     GAD 7 : Generalized Anxiety Score 08/02/2017 07/12/2017  Nervous, Anxious, on Edge 0 0  Control/stop worrying 0 0  Worry too much - different things 0 0  Trouble relaxing 0 0  Restless 0 0  Easily annoyed or irritable 0 0  Afraid - awful might happen 0 0  Total GAD 7 Score 0 0   Medications   (very important)  New medications from outside sources are available for reconciliation  amLODipine (NORVASC) 5 MG tablet buPROPion (WELLBUTRIN SR) 150 MG 12 hr tablet gabapentin (NEURONTIN) 100 MG capsule ipratropium (ATROVENT) 0.03 % nasal spray meclizine (ANTIVERT) 25 MG tablet pantoprazole (PROTONIX) 40 MG tablet rosuvastatin (CRESTOR) 10 MG tablet rosuvastatin (CRESTOR) 10 MG tablet traZODone (DESYREL) 150 MG tablet  Goals    . "I need help with getting food delivered to my home." (pt-stated)     Current Barriers:  Marland Kitchen Knowledge Deficits related to resources available to deliver food to her home.in patient with Chronic Diagnoses of HTN, Depression, DOE , and CVA  . Nurse Case Manager Clinical Goal(s): Over the next 7 days, patient will work with LCSW to address needs related to food delivery options.   Interventions:   . Previously LCSW provided education to patient re: ADTS and Meals On Wheels (waiting list) . Previously encouraged client to call [employer] to add her name to waiting list for Meals on Wheels.  . Discussed current food arrangements. She is signed up with [employer] in [place] and they deliver a food box. Her daughter and friend also brings things to her as needed.   Talked with client about vision challenges of client  Talked with client about ambulation needs (she said she gets dizzy on occasion)  Talked with client about her upcoming client appointments (she has appointment on 06/28/19 with Dr. Danella Deis, orthopedics: client said she has swelling on her left knee)  Talked with client about transport needs of client  Talked with client about social support (client said she has social support from [name], cousin)  Patient Self Care Activities:  Currently UNABLE TO independently drive due to dizziness s/p CVA but she is able to warm up meals and feed herself.    Client said she has a friend, [name], who occasionally helps client with meals   Plan: LCSW to call client in next 4 weeks to talk with client about food needs of client and about local food resources Client to participate in home health physical therapy sessions as scheduled Client to communicate with RN CM [name] to discuss nursing needs of client. Client to attend scheduled client medical appointments  Initial goal documentation      Follow Up Plan: LCSW to call client in  next 4 weeks to talk with client about food needs of client and about local food resources in the area.  [name].[name] MSW, LCSW Licensed Clinical Social Worker [facility] Care Management [phone]

## 2019-06-26 NOTE — Patient Instructions (Addendum)
Licensed Clinical Social Worker Visit Information  Goals we discussed today:  Goals    . "I need help with getting food delivered to my home." (pt-stated)     Current Barriers:  Marland Kitchen Knowledge Deficits related to resources available to deliver food to her home.in patient with Chronic Diagnoses of HTN, Depression, DOE , and CVA  . Nurse Case Manager Clinical Goal(s): Over the next 7 days, patient will work with LCSW to address needs related to food delivery options.   Interventions:  Previously LCSW provided education to patient re: ADTS and Meals On Wheels (waiting list)  Previously encouraged client to call ADTS to add her name to waiting list for Meals on Wheels.  Discussed current food arrangements. She is signed up with [employer] in [place] and they deliver a food box. Her daughter and friend also brings things to her as needed.  Talked with client about vision challenges of client  Talked with client about ambulation needs (she said she gets dizzy on occasion)  Talked with client about her upcoming client appointments (she has appointment on 06/28/19 with Dr. Danella Deis, orthopedics: client said she has swelling on her left knee)  Talked with client about transport needs of client  Talked with client about social support (client said she has social support from [name], cousin)  Patient Self Care Activities:  Currently UNABLE TO independently drive due to dizziness s/p CVA but she is able to warm up meals and feed herself.    Client said she has a friend, [name], who occasionally helps client with meals   Plan: LCSW to call client in next 4 weeks to talk with client about food needs of client and about local food resources Client to participate in home health physical therapy sessions as scheduled Client to communicate with RN CM [name] to discuss nursing needs of client. Client to attend scheduled client medical appointments  Initial goal documentation      Materials  Provided: No  Follow Up Plan:  LCSW to call client in next 4 weeks to talk with client about food needs of client and about local food resources  The patient verbalized understanding of instructions provided today and declined a print copy of patient instruction materials.   [name].Kynley Metzger MSW, LCSW Licensed Clinical Social Worker [facility] Care Management [phone]

## 2019-06-28 ENCOUNTER — Ambulatory Visit: Payer: Medicare Other

## 2019-06-28 ENCOUNTER — Encounter: Payer: Self-pay | Admitting: Orthopaedic Surgery

## 2019-06-28 ENCOUNTER — Ambulatory Visit: Payer: Medicare Other | Admitting: Orthopaedic Surgery

## 2019-06-28 ENCOUNTER — Other Ambulatory Visit: Payer: Self-pay

## 2019-06-28 VITALS — BP 172/67 | HR 64 | Temp 97.1°F | Ht 65.0 in | Wt 141.0 lb

## 2019-06-28 DIAGNOSIS — G8929 Other chronic pain: Secondary | ICD-10-CM | POA: Diagnosis not present

## 2019-06-28 DIAGNOSIS — M25562 Pain in left knee: Secondary | ICD-10-CM

## 2019-06-28 MED ORDER — HYDROCODONE-ACETAMINOPHEN 5-325 MG PO TABS: ORAL_TABLET | ORAL | 0 refills | Status: DC

## 2019-06-28 NOTE — Progress Notes (Signed)
Subjective:    Patient ID: Pamela Hughes, female    DOB: May 03, 1941, 79 y.o.   MRN: [id number]  HPI She has had pain in the left knee for several months, getting worse over the last six weeks.  I saw her in 2014 for knee pain.  She did well over the years.  She has swelling and popping of the left knee but no giving way.  She has more lateral pain.  She has tried [employer] and heat with limited results.  She has no trauma, no redness, no numbness.   Review of Systems  Constitutional: Positive for activity change.  Endocrine: Positive for cold intolerance and heat intolerance.  Musculoskeletal: Positive for arthralgias, gait problem and joint swelling.  Allergic/Immunologic: Positive for environmental allergies and food allergies.  Psychiatric/Behavioral: The patient is nervous/anxious.   All other systems reviewed and are negative.  For Review of Systems, all other systems reviewed and are negative.  The following is a summary of the past history medically, past history surgically, known current medicines, social history and family history.  This information is gathered electronically by the computer from prior information and documentation.  I review this each visit and have found including this information at this point in the chart is beneficial and informative.   Past Medical History:  Diagnosis Date  . Allergy   . Alopecia   . Anxiety   . Arthritis   . Asthma   . Collagenous colitis   . Depression   . Diarrhea    chronic- "states for at least a year)  . Edema   . GERD (gastroesophageal reflux disease)   . HOH (hard of hearing)   . Hypertension     Past Surgical History:  Procedure Laterality Date  . ABDOMINAL HYSTERECTOMY     one ovary left  . CATARACT EXTRACTION W/PHACO Right 02/17/2018   Procedure: CATARACT EXTRACTION PHACO AND INTRAOCULAR LENS PLACEMENT (IOC);  Surgeon: Baruch Goldmann, MD;  Location: [facility];  Service: Ophthalmology;  Laterality: Right;  CDE:  4.84  . CATARACT EXTRACTION W/PHACO Left 03/31/2018   Procedure: CATARACT EXTRACTION PHACO AND INTRAOCULAR LENS PLACEMENT (IOC) LEFT CDE: 4.51;  Surgeon: Baruch Goldmann, MD;  Location: [facility];  Service: Ophthalmology;  Laterality: Left;  . COLONOSCOPY N/A 12/17/2016   Procedure: COLONOSCOPY;  Surgeon: Rogene Houston, MD;  Location: [facility];  Service: Endoscopy;  Laterality: N/A;  2:05  . SPINE SURGERY  1990s   L spine    Current Outpatient Medications on File Prior to Visit  Medication Sig Dispense Refill  . amLODipine (NORVASC) 5 MG tablet Take 1 tablet (5 mg total) by mouth daily. 90 tablet 3  . buPROPion (WELLBUTRIN SR) 150 MG 12 hr tablet Take 1 tablet (150 mg total) by mouth 2 (two) times daily. (Needs to be seen before next refill) (Patient taking differently: Take 150 mg by mouth daily. ) 60 tablet 0  . gabapentin (NEURONTIN) 100 MG capsule Take 1 capsule (100 mg total) by mouth 3 (three) times daily. One three times daily 90 capsule 2  . ipratropium (ATROVENT) 0.03 % nasal spray USE TWICE DAILY AS DIRECTED 30 mL 0  . meclizine (ANTIVERT) 25 MG tablet TAKE ONE TABLET 3 TIMES A DAY AS NEEDED. (Patient taking differently: Take 25 mg by mouth 3 (three) times daily as needed for dizziness. ) 30 tablet 1  . pantoprazole (PROTONIX) 40 MG tablet TAKE ONE TABLET EACH MORNING BEFORE BREAKFAST 30 tablet 2  .  rosuvastatin (CRESTOR) 10 MG tablet Take 1 tablet (10 mg total) by mouth daily. (Needs to be seen before next refill) 30 tablet 0  . rosuvastatin (CRESTOR) 10 MG tablet Take 10 mg by mouth daily.    . traZODone (DESYREL) 150 MG tablet TAKE ONE TABLET DAILY AT BEDTIME (Patient taking differently: Take 150 mg by mouth at bedtime. ) 30 tablet 0   No current facility-administered medications on file prior to visit.    Social History   Socioeconomic History  . Marital status: Divorced    Spouse name: Not on file  . Number of children: 1  . Years of education: 40  . Highest education  level: 10th grade  Occupational History  . Occupation: Retired    Comment: Designer, fashion/clothing  Tobacco Use  . Smoking status: Former Smoker    Packs/day: 0.25    Years: 5.00    Pack years: 1.25    Types: Cigarettes    Quit date: 10/09/1974    Years since quitting: 44.7  . Smokeless tobacco: Never Used  Substance and Sexual Activity  . Alcohol use: Yes    Comment: glass of wine occasionally  . Drug use: No  . Sexual activity: Yes    Birth control/protection: Surgical  Other Topics Concern  . Not on file  Social History Narrative  . Not on file   Social Determinants of Health   Financial Resource Strain:   . Difficulty of Paying Living Expenses: Not on file  Food Insecurity: Unknown  . Worried About Programme researcher, broadcasting/film/video in the Last Year: Not on file  . Ran Out of Food in the Last Year: Never true  Transportation Needs:   . Lack of Transportation (Medical): Not on file  . Lack of Transportation (Non-Medical): Not on file  Physical Activity: Unknown  . Days of Exercise per Week: Not on file  . Minutes of Exercise per Session: 0 min  Stress: Stress Concern Present  . Feeling of Stress : To some extent  Social Connections:   . Frequency of Communication with Friends and Family: Not on file  . Frequency of Social Gatherings with Friends and Family: Not on file  . Attends Religious Services: Not on file  . Active Member of Clubs or Organizations: Not on file  . Attends Banker Meetings: Not on file  . Marital Status: Not on file  Intimate Partner Violence: Not At Risk  . Fear of Current or Ex-Partner: No  . Emotionally Abused: No  . Physically Abused: No  . Sexually Abused: No    Family History  Problem Relation Age of Onset  . Alzheimer's disease Mother   . Alcohol abuse Father   . Diabetes Brother   . Healthy Daughter     BP (!) 172/67   Pulse 64   Temp (!) 97.1 F (36.2 C)   Ht 5\' 5"  (1.651 m)   Wt 141 lb (64 kg)   BMI 23.46 kg/m   Body mass index is  23.46 kg/m.      Objective:   Physical Exam Vitals and nursing note reviewed.  Constitutional:      Appearance: She is well-developed.  HENT:     Head: Normocephalic and atraumatic.  Eyes:     Conjunctiva/sclera: Conjunctivae normal.     Pupils: Pupils are equal, round, and reactive to light.  Cardiovascular:     Rate and Rhythm: Normal rate and regular rhythm.  Pulmonary:     Effort: Pulmonary effort is  normal.  Abdominal:     Palpations: Abdomen is soft.  Musculoskeletal:     Cervical back: Normal range of motion and neck supple.       Legs:  Skin:    General: Skin is warm and dry.  Neurological:     Mental Status: She is alert and oriented to person, place, and time.     Cranial Nerves: No cranial nerve deficit.     Motor: No abnormal muscle tone.     Coordination: Coordination normal.     Deep Tendon Reflexes: Reflexes are normal and symmetric. Reflexes normal.  Psychiatric:        Behavior: Behavior normal.        Thought Content: Thought content normal.        Judgment: Judgment normal.      X-rays were done of the left knee, reported separately.     Assessment & Plan:   Encounter Diagnosis  Name Primary?  . Chronic pain of left knee Yes   PROCEDURE NOTE:  The patient request injection, verbal consent was obtained.  The left knee was prepped appropriately after time out was performed.   Sterile technique was observed and anesthesia was provided by ethyl chloride and a 20-gauge needle was used to inject the knee area.  A 16-gauge needle was then used to aspirate the knee.  Color of fluid aspirated was straw  Total cc's aspirated was 45.    Injection of 1 cc of Depo-Medrol 40 mg with several cc's of plain xylocaine was then performed.  A band aid dressing was applied.  The patient was advised to apply ice later today and tomorrow to the injection sight as needed.  Return in two weeks.  Call if any problem.  Precautions  discussed.   Electronically Signed Darreld Mclean, MD 1/28/202110:20 AM

## 2019-07-12 ENCOUNTER — Other Ambulatory Visit: Payer: Self-pay | Admitting: Family Medicine

## 2019-07-17 ENCOUNTER — Encounter: Payer: Self-pay | Admitting: Orthopaedic Surgery

## 2019-07-17 ENCOUNTER — Other Ambulatory Visit: Payer: Self-pay

## 2019-07-17 ENCOUNTER — Ambulatory Visit (INDEPENDENT_AMBULATORY_CARE_PROVIDER_SITE_OTHER): Payer: Medicare Other | Admitting: Orthopaedic Surgery

## 2019-07-17 VITALS — BP 159/81 | HR 76 | Ht 65.0 in | Wt 139.1 lb

## 2019-07-17 DIAGNOSIS — M25562 Pain in left knee: Secondary | ICD-10-CM

## 2019-07-17 DIAGNOSIS — G8929 Other chronic pain: Secondary | ICD-10-CM

## 2019-07-17 NOTE — Progress Notes (Signed)
PROCEDURE NOTE:  The patient request injection, verbal consent was obtained.  The left knee was prepped appropriately after time out was performed.   Sterile technique was observed and anesthesia was provided by ethyl chloride and a 20-gauge needle was used to inject the knee area.  A 16-gauge needle was then used to aspirate the knee.  Color of fluid aspirated was straw  Total cc's aspirated was 30.    Injection of 1 cc of Depo-Medrol 40 mg with several cc's of plain xylocaine was then performed.  A band aid dressing was applied.  The patient was advised to apply ice later today and tomorrow to the injection sight as needed.  I will set up MRI of the left knee.  I am concerned about meniscus tear.  Return in two weeks.  Call if any problem.  Precautions discussed.   Electronically Signed Darreld Mclean, MD 2/16/20212:19 PM

## 2019-07-23 ENCOUNTER — Ambulatory Visit (INDEPENDENT_AMBULATORY_CARE_PROVIDER_SITE_OTHER): Payer: Medicare Other | Admitting: Licensed Clinical Social Worker

## 2019-07-23 DIAGNOSIS — Z8673 Personal history of transient ischemic attack (TIA), and cerebral infarction without residual deficits: Secondary | ICD-10-CM

## 2019-07-23 DIAGNOSIS — F331 Major depressive disorder, recurrent, moderate: Secondary | ICD-10-CM

## 2019-07-23 DIAGNOSIS — R0609 Other forms of dyspnea: Secondary | ICD-10-CM

## 2019-07-23 DIAGNOSIS — I10 Essential (primary) hypertension: Secondary | ICD-10-CM | POA: Diagnosis not present

## 2019-07-23 NOTE — Chronic Care Management (AMB) (Signed)
Care Management Note   Pamela Hughes is a 79 y.o. year old female who is a primary care patient of Pamela Hughes, Pamela Radon, MD. The CM team was consulted for assistance with chronic disease management and care coordination.   I reached out to Pamela Hughes by phone today.    Review of patient status, including review of consultants reports, relevant laboratory and other test results, and collaboration with appropriate care team members and the patient's provider was performed as part of comprehensive patient evaluation and provision of chronic care management services.   Social determinants of health: risk of tobacco use; risk of stress; risk of depression    Virtual BH Phone Follow Up from 08/02/2017 in [facility]  PHQ-9 Total Score  10     GAD 7 : Generalized Anxiety Score 08/02/2017 07/12/2017  Nervous, Anxious, on Edge 0 0  Control/stop worrying 0 0  Worry too much - different things 0 0  Trouble relaxing 0 0  Restless 0 0  Easily annoyed or irritable 0 0  Afraid - awful might happen 0 0  Total GAD 7 Score 0 0   Medications   (very important)  New medications from outside sources are available for reconciliation  amLODipine (NORVASC) 5 MG tablet buPROPion (WELLBUTRIN SR) 150 MG 12 hr tablet gabapentin (NEURONTIN) 100 MG capsule HYDROcodone-acetaminophen (NORCO/VICODIN) 5-325 MG tablet ipratropium (ATROVENT) 0.03 % nasal spray meclizine (ANTIVERT) 25 MG tablet pantoprazole (PROTONIX) 40 MG tablet rosuvastatin (CRESTOR) 10 MG tablet rosuvastatin (CRESTOR) 10 MG tablet traZODone (DESYREL) 150 MG tablet  Goals    . "I need help with getting food delivered to my home." (pt-stated)     Current Barriers:  Pamela Hughes Kitchen Knowledge Deficits related to resources available to deliver food to her home.in patient with Chronic Diagnoses of HTN, Depression, DOE , and CVA  . Nurse Case Manager Clinical Goal(s): Over the next 7 days, patient will work with LCSW to address needs  related to food delivery options.   Interventions:   Previously LCSW provided education to patient re: ADTS and Meals On Wheels (waiting list)  Previously encouraged client to call ADTS to add her name to waiting list for Meals on Wheels.   Discussed current food arrangements. She is signed up with [employer] in [place] and they deliver a food box. Her daughter and friend also brings things to her as needed.   Talked with client about vision challenges of client  Talked with client about ambulation needs (she said she gets dizzy on occasion)  Talked with client about her past client appointments (she had appointment on 06/28/19 with Dr. Hilda Lias, orthopedics: client said she had some swelling on her left knee prior to that appointment)  Talked with client about transport needs of client  Talked with client about social support (client said she has social support from Pamela Hughes, cousin)  Client and LCSW talked of locations where she could call to set up appointment for COVID 19 vaccine. (she asked about possible locations to seek vaccine appointment. LCSW mentioned [facility] in [place], [place] and mentioned [employer] vaccination site in [place])  Talked with client about frozen meals she receives monthly through Meals on Wheels program  Patient Self Care Activities:  Currently UNABLE TO independently drive due to dizziness s/p CVA but she is able to warm up meals and feed herself.    Client said she has a friend, Pamela Hughes, who occasionally helps client with meals   Plan: LCSW to  call client in next 4 weeks to talk with client about food needs of client and about local food resources Client to participate in home health physical therapy sessions as scheduled Client to communicate with RN CM Pamela Hughes to discuss nursing needs of client. Client to attend scheduled client medical appointments  Initial goal documentation      Follow Up Plan: LCSW to call client  in next 4 weeks to talk with client about the food needs of client and about local food resources  Pamela Hughes.Pamela Hughes MSW, LCSW Licensed Clinical Social Worker [facility] [phone]

## 2019-07-23 NOTE — Patient Instructions (Addendum)
Licensed Clinical Social Worker Visit Information  Goals we discussed today:   Previously LCSW provided education to patient re: ADTS and Meals On Wheels (waiting list)  Previously encouraged client to call ADTS to add her name to waiting list for Meals on Wheels.  Discussed current food arrangements. She is signed up with [employer] in [place] and they deliver a food box. Her daughter and friend also brings things to her as needed.  Talked with client about vision challenges of client  Talked with client about ambulation needs (she said she gets dizzy on occasion)  Talked with client about her past client appointments (she had appointment on 06/28/19 with Dr. Hilda Lias, orthopedics: client said she had some swelling on her left knee prior to that appointment)  Talked with client about transport needs of client  Talked with client about social support (client said she has social support from [name], cousin)  Client and LCSW talked of locations where she could call to set up appointment for COVID 19 vaccine. (she asked about possible locations to seek vaccine appointment. LCSW mentioned [facility] in [place], [place] and mentioned [employer] vaccination site in [place])  Talked with client about frozen meals she receives monthly through Meals on [employer] Provided:   No  Follow Up Plan: LCSW to call client in next 4 weeks to talk with client about the food needs of client and about local food resources  The patient verbalized understanding of instructions provided today and declined a print copy of patient instruction materials.   [name].Latria Mccarron MSW, LCSW Licensed Clinical Social Worker [facility] [phone]

## 2019-07-31 ENCOUNTER — Ambulatory Visit: Payer: Medicare Other | Admitting: Orthopaedic Surgery

## 2019-08-07 ENCOUNTER — Ambulatory Visit (HOSPITAL_COMMUNITY)
Admission: RE | Admit: 2019-08-07 | Discharge: 2019-08-07 | Disposition: A | Discharge status: Home / Self Care | Payer: Medicare Other | Source: Ambulatory Visit | Attending: Orthopaedic Surgery | Admitting: Orthopaedic Surgery

## 2019-08-07 ENCOUNTER — Other Ambulatory Visit: Payer: Self-pay

## 2019-08-07 DIAGNOSIS — G8929 Other chronic pain: Secondary | ICD-10-CM | POA: Insufficient documentation

## 2019-08-07 DIAGNOSIS — M25562 Pain in left knee: Secondary | ICD-10-CM | POA: Insufficient documentation

## 2019-08-09 ENCOUNTER — Encounter: Payer: Self-pay | Admitting: Orthopaedic Surgery

## 2019-08-09 ENCOUNTER — Other Ambulatory Visit: Payer: Self-pay

## 2019-08-09 ENCOUNTER — Ambulatory Visit: Payer: Medicare Other | Admitting: Orthopaedic Surgery

## 2019-08-09 VITALS — BP 160/63 | HR 57 | Ht 65.0 in | Wt 139.0 lb

## 2019-08-09 DIAGNOSIS — M25562 Pain in left knee: Secondary | ICD-10-CM | POA: Diagnosis not present

## 2019-08-09 DIAGNOSIS — G8929 Other chronic pain: Secondary | ICD-10-CM | POA: Diagnosis not present

## 2019-08-09 DIAGNOSIS — M84451A Pathological fracture, right femur, initial encounter for fracture: Secondary | ICD-10-CM

## 2019-08-09 NOTE — Progress Notes (Signed)
Patient Pamela Hughes, female DOB:1940-12-28, 79 y.o. SHF:[id number]  Chief Complaint  Patient presents with  . Knee Pain    left     HPI  Pamela Hughes is a 79 y.o. female who has left knee pain.  She had MRI which showed: IMPRESSION: 1. Small subchondral fracture seen in the weight-bearing surface of the lateral femoral condyle and the lateral tibial plateau with significant surrounding marrow edema. 2. Intrasubstance degeneration of the medial meniscus and anterior, lateral meniscus as described above. 3. Mild intrasubstance degeneration of the PCL, however it is intact. 4. Tricompartmental osteoarthritis, advanced within the lateral compartment. 5. Moderate knee joint effusion. 6. Insertional quadriceps tendinosis 7. Loculated popliteal cyst without evidence of rupture.  I have informed her of the findings and explained about the subchondral fracture.  I had prior reviewed the MRI independently.  She says she does not hurt since the injection and sees no need for any additional decrease of weight bearing, walker or such.  She says she is fine and will be fine.  She does not want to consider possible total knee in the future.  She has near full ROM today.  NV intact. She has no effusion today.  Body mass index is 23.13 kg/m.  ROS  Review of Systems  All other systems reviewed and are negative.  The following is a summary of the past history medically, past history surgically, known current medicines, social history and family history.  This information is gathered electronically by the computer from prior information and documentation.  I review this each visit and have found including this information at this point in the chart is beneficial and informative.    Past Medical History:  Diagnosis Date  . Allergy   . Alopecia   . Anxiety   . Arthritis   . Asthma   . Collagenous colitis   . Depression   . Diarrhea    chronic- "states for at least a year)  . Edema    . GERD (gastroesophageal reflux disease)   . HOH (hard of hearing)   . Hypertension     Past Surgical History:  Procedure Laterality Date  . ABDOMINAL HYSTERECTOMY     one ovary left  . CATARACT EXTRACTION W/PHACO Right 02/17/2018   Procedure: CATARACT EXTRACTION PHACO AND INTRAOCULAR LENS PLACEMENT (IOC);  Surgeon: Baruch Goldmann, MD;  Location: [facility];  Service: Ophthalmology;  Laterality: Right;  CDE: 4.84  . CATARACT EXTRACTION W/PHACO Left 03/31/2018   Procedure: CATARACT EXTRACTION PHACO AND INTRAOCULAR LENS PLACEMENT (IOC) LEFT CDE: 4.51;  Surgeon: Baruch Goldmann, MD;  Location: [facility];  Service: Ophthalmology;  Laterality: Left;  . COLONOSCOPY N/A 12/17/2016   Procedure: COLONOSCOPY;  Surgeon: Rogene Houston, MD;  [facility];  Service: Endoscopy;  Laterality: N/A;  2:05  . SPINE SURGERY  1990s   L spine    Family History  Problem Relation Age of Onset  . Alzheimer's disease Mother   . Alcohol abuse Father   . Diabetes Brother   . Healthy Daughter     Social History Social History   Tobacco Use  . Smoking status: Former Smoker    Packs/day: 0.25    Years: 5.00    Pack years: 1.25    Types: Cigarettes    Quit date: 10/09/1974    Years since quitting: 44.8  . Smokeless tobacco: Never Used  Substance Use Topics  . Alcohol use: Yes    Comment: glass of wine occasionally  .  Drug use: No    No Known Allergies  Current Outpatient Medications  Medication Sig Dispense Refill  . amLODipine (NORVASC) 5 MG tablet Take 1 tablet (5 mg total) by mouth daily. 90 tablet 3  . buPROPion (WELLBUTRIN SR) 150 MG 12 hr tablet Take 1 tablet (150 mg total) by mouth 2 (two) times daily. (Needs to be seen before next refill) (Patient taking differently: Take 150 mg by mouth daily. ) 60 tablet 0  . gabapentin (NEURONTIN) 100 MG capsule Take 1 capsule (100 mg total) by mouth 3 (three) times daily. One three times daily 90 capsule 2  . HYDROcodone-acetaminophen  (NORCO/VICODIN) 5-325 MG tablet One tablet every four hours for pain. 30 tablet 0  . ipratropium (ATROVENT) 0.03 % nasal spray USE TWICE DAILY AS DIRECTED 30 mL 0  . meclizine (ANTIVERT) 25 MG tablet TAKE ONE TABLET 3 TIMES A DAY AS NEEDED. (Patient taking differently: Take 25 mg by mouth 3 (three) times daily as needed for dizziness. ) 30 tablet 1  . metoprolol succinate (TOPROL-XL) 25 MG 24 hr tablet Take 25 mg by mouth daily.    . pantoprazole (PROTONIX) 40 MG tablet TAKE ONE TABLET EACH MORNING BEFORE BREAKFAST 30 tablet 2  . rosuvastatin (CRESTOR) 10 MG tablet Take 1 tablet (10 mg total) by mouth daily. (Needs to be seen before next refill) 30 tablet 0  . rosuvastatin (CRESTOR) 10 MG tablet Take 10 mg by mouth daily.    . traZODone (DESYREL) 150 MG tablet TAKE ONE TABLET DAILY AT BEDTIME (Patient taking differently: Take 150 mg by mouth at bedtime. ) 30 tablet 0   No current facility-administered medications for this visit.     Physical Exam  Blood pressure (!) 160/63, pulse (!) 57, height 5\' 5"  (1.651 m), weight 139 lb (63 kg).  Constitutional: overall normal hygiene, normal nutrition, well developed, normal grooming, normal body habitus. Assistive device:none  Musculoskeletal: gait and station Limp left slightly., muscle tone and strength are normal, no tremors or atrophy is present.  .  Neurological: coordination overall normal.  Deep tendon reflex/nerve stretch intact.  Sensation normal.  Cranial nerves II-XII intact.   Skin:   Normal overall no scars, lesions, ulcers or rashes. No psoriasis.  Psychiatric: Alert and oriented x 3.  Recent memory intact, remote memory unclear.  Normal mood and affect. Well groomed.  Good eye contact.  Cardiovascular: overall no swelling, no varicosities, no edema bilaterally, normal temperatures of the legs and arms, no clubbing, cyanosis and good capillary refill.  Lymphatic: palpation is normal.  Left knee has near full ROM and no pain, no  effusion.  All other systems reviewed and are negative   The patient has been educated about the nature of the problem(s) and counseled on treatment options.  The patient appeared to understand what I have discussed and is in agreement with it.  Encounter Diagnoses  Name Primary?  . Subchondral insufficiency fracture of condyle of right femur, initial encounter (HCC) Yes  . Chronic pain of left knee     PLAN Call if any problems.  Precautions discussed.  Continue current medications.   Return to clinic 3 weeks   X-rays of the left knee on return.  Electronically Signed , MD 3/11/202110:10 AM

## 2019-08-14 ENCOUNTER — Ambulatory Visit: Payer: Medicare Other | Admitting: Orthopaedic Surgery

## 2019-08-20 ENCOUNTER — Ambulatory Visit (INDEPENDENT_AMBULATORY_CARE_PROVIDER_SITE_OTHER): Payer: Medicare Other | Admitting: Licensed Clinical Social Worker

## 2019-08-20 DIAGNOSIS — I10 Essential (primary) hypertension: Secondary | ICD-10-CM

## 2019-08-20 DIAGNOSIS — Z8673 Personal history of transient ischemic attack (TIA), and cerebral infarction without residual deficits: Secondary | ICD-10-CM

## 2019-08-20 DIAGNOSIS — R0609 Other forms of dyspnea: Secondary | ICD-10-CM

## 2019-08-20 NOTE — Patient Instructions (Addendum)
Licensed Clinical Social Worker Visit Information  Goals we discussed today:  Goals    . "I need help with getting food delivered to my home." (pt-stated)     Current Barriers:  Marland Kitchen Knowledge Deficits related to resources available to deliver food to her home.in patient with Chronic Diagnoses of HTN, Depression, DOE , and CVA  . Nurse Case Manager Clinical Goal(s): Over the next 7 days, patient will work with LCSW to address needs related to food delivery options.   Interventions:   Previously LCSW provided education to patient re: ADTS and Meals On Wheels (waiting list)  Previously encouraged client to call ADTS to add her name to waiting list for Meals on Wheels.  Discussed current food needs of client  Talked with client about vision challenges of client  Talked with client about ambulation needs (she said she gets dizzy on occasion)  Talked with client about transport needs of client  Talked with client about social support (client said she has social support from [name], cousin)  Talked with client about frozen meals she receives monthly through Meals on Wheels program  Talked with client about recent appointment with Dr. Delaine Lame, orthopedist.   Patient Self Care Activities:  Currently UNABLE TO independently drive due to dizziness s/p CVA but she is able to warm up meals and feed herself.    Client said she has a friend, [name], who occasionally helps client with meals   Plan: LCSW to call client in next 4 weeks to talk with client about food needs of client and about local food resources Client to participate in home health physical therapy sessions as scheduled Client to communicate with RN CM [name] to discuss nursing needs of client. Client to attend scheduled client medical appointments  Initial goal documentation   Materials Provided:  No  Follow Up Plan:  LCSW to call client in next 4 weeks to talk with client about food needs of client and about local  food resources  The patient verbalized understanding of instructions provided today and declined a print copy of patient instruction materials.   [name].Anjela Cassara MSW, LCSW Licensed Clinical Social Worker [facility] [phone]

## 2019-08-20 NOTE — Chronic Care Management (AMB) (Signed)
  Care Management Note   Pamela Hughes is a 79 y.o. year old female who is a primary care patient of Dettinger, Elige Radon, MD. The CM team was consulted for assistance with chronic disease management and care coordination.   I reached out to [name] by phone today.    Review of patient status, including review of consultants reports, relevant laboratory and other test results, and collaboration with appropriate care team members and the patient's provider was performed as part of comprehensive patient evaluation and provision of chronic care management services.   Social determinants of health: risk of tobacco use; risk of depression;risk of stress    Virtual BH Phone Follow Up from 08/02/2017 in [facility]  PHQ-9 Total Score  10     GAD 7 : Generalized Anxiety Score 08/02/2017 07/12/2017  Nervous, Anxious, on Edge 0 0  Control/stop worrying 0 0  Worry too much - different things 0 0  Trouble relaxing 0 0  Restless 0 0  Easily annoyed or irritable 0 0  Afraid - awful might happen 0 0  Total GAD 7 Score 0 0   Medications   amLODipine (NORVASC) 5 MG tablet buPROPion (WELLBUTRIN SR) 150 MG 12 hr tablet gabapentin (NEURONTIN) 100 MG capsule HYDROcodone-acetaminophen (NORCO/VICODIN) 5-325 MG tablet ipratropium (ATROVENT) 0.03 % nasal spray meclizine (ANTIVERT) 25 MG tablet metoprolol succinate (TOPROL-XL) 25 MG 24 hr tablet pantoprazole (PROTONIX) 40 MG tablet rosuvastatin (CRESTOR) 10 MG tablet rosuvastatin (CRESTOR) 10 MG tablet traZODone (DESYREL) 150 MG tablet  Goals    . "I need help with getting food delivered to my home." (pt-stated)     Current Barriers:  Marland Kitchen Knowledge Deficits related to resources available to deliver food to her home.in patient with Chronic Diagnoses of HTN, Depression, DOE , and CVA  . Nurse Case Manager Clinical Goal(s): Over the next 7 days, patient will work with LCSW to address needs related to food delivery options.    Interventions:   PreviouslyLCSW provided education to patient re: ADTS and Meals On Wheels (waiting list)  Previously encouraged client to call [employer] to add her name to waiting list for Meals on Wheels.   Discussed current food needs of client   Talked with client about vision challenges of client  Talked with client about ambulation needs (she said she gets dizzy on occasion)  Talked with client about transport needs of client  Talked with client about social support (client said she has social support from [name], cousin)  Talked with client about frozen meals she receives monthly through Meals on Wheels program  Talked with client about recent appointment with Dr. Delaine Lame, orthopedist.    Patient Self Care Activities:  Currently UNABLE TO independently drive due to dizziness s/p CVA but she is able to warm up meals and feed herself.    Client said she has a friend, [name], who occasionally helps client with meals   Plan: LCSW to call client in next 4 weeks to talk with client about food needs of client and about local food resources Client to communicate with RN CM [name] to discuss nursing needs of client. Client to attend scheduled client medical appointments  Initial goal documentation     Follow Up Plan: LCSW to call client in next 4 weeks to talk about food needs of client and about local food resources  [name].Deondrick Searls MSW, LCSW Licensed Clinical Social Worker [facility] [phone]

## 2019-09-04 ENCOUNTER — Ambulatory Visit: Payer: Medicare Other | Admitting: Orthopaedic Surgery

## 2019-09-05 ENCOUNTER — Other Ambulatory Visit: Payer: Self-pay | Admitting: Family Medicine

## 2019-09-05 ENCOUNTER — Other Ambulatory Visit: Payer: Self-pay | Admitting: *Deleted

## 2019-09-05 ENCOUNTER — Telehealth: Payer: Self-pay | Admitting: Family Medicine

## 2019-09-05 MED ORDER — TRIAMCINOLONE ACETONIDE 0.1 % EX CREA: 1.0000 "application " | TOPICAL_CREAM | Freq: Two times a day (BID) | CUTANEOUS | 2 refills | Status: DC

## 2019-09-05 NOTE — Telephone Encounter (Signed)
Itching on back and scalp again.  Some bumps. This happens often.  Kenalog cream is only thing that works.

## 2019-09-05 NOTE — Telephone Encounter (Signed)
Aware. 

## 2019-09-05 NOTE — Telephone Encounter (Signed)
Sent a refill for triamcinolone

## 2019-09-05 NOTE — Telephone Encounter (Signed)
  Prescription Request  09/05/2019  What is the name of the medication or equipment? triamcinolone cream (KENALOG) 0.1 %    Have you contacted your pharmacy to request a refill? (if applicable) YES  Which pharmacy would you like this sent to? [facility], has appt with Dr. D. 09/12/19   Patient notified that their request is being sent to the clinical staff for review and that they should receive a response within 2 business days.

## 2019-09-12 ENCOUNTER — Ambulatory Visit: Payer: Medicare Other | Admitting: Family Medicine

## 2019-09-19 ENCOUNTER — Encounter: Payer: Self-pay | Admitting: Family Medicine

## 2019-09-19 ENCOUNTER — Ambulatory Visit (INDEPENDENT_AMBULATORY_CARE_PROVIDER_SITE_OTHER): Payer: Medicare Other | Admitting: Licensed Clinical Social Worker

## 2019-09-19 ENCOUNTER — Ambulatory Visit (INDEPENDENT_AMBULATORY_CARE_PROVIDER_SITE_OTHER): Payer: Medicare Other | Admitting: Family Medicine

## 2019-09-19 DIAGNOSIS — I10 Essential (primary) hypertension: Secondary | ICD-10-CM

## 2019-09-19 DIAGNOSIS — Z8673 Personal history of transient ischemic attack (TIA), and cerebral infarction without residual deficits: Secondary | ICD-10-CM

## 2019-09-19 DIAGNOSIS — R0609 Other forms of dyspnea: Secondary | ICD-10-CM

## 2019-09-19 DIAGNOSIS — E785 Hyperlipidemia, unspecified: Secondary | ICD-10-CM | POA: Diagnosis not present

## 2019-09-19 MED ORDER — TRIAMCINOLONE ACETONIDE 0.1 % EX CREA: 1.0000 "application " | TOPICAL_CREAM | Freq: Two times a day (BID) | CUTANEOUS | 2 refills | Status: DC

## 2019-09-19 MED ORDER — ALBUTEROL SULFATE HFA 108 (90 BASE) MCG/ACT IN AERS: 2.0000 | INHALATION_SPRAY | Freq: Four times a day (QID) | RESPIRATORY_TRACT | 1 refills | Status: DC | PRN

## 2019-09-19 MED ORDER — TRAZODONE HCL 150 MG PO TABS: 150.0000 mg | ORAL_TABLET | Freq: Every day | ORAL | 0 refills | Status: DC

## 2019-09-19 MED ORDER — ROSUVASTATIN CALCIUM 10 MG PO TABS: 10.0000 mg | ORAL_TABLET | Freq: Every day | ORAL | 3 refills | Status: DC

## 2019-09-19 MED ORDER — AMLODIPINE BESYLATE 2.5 MG PO TABS: 2.5000 mg | ORAL_TABLET | Freq: Every day | ORAL | 3 refills | Status: DC

## 2019-09-19 MED ORDER — IPRATROPIUM BROMIDE 0.03 % NA SOLN: 1.0000 | Freq: Two times a day (BID) | NASAL | 0 refills | Status: DC

## 2019-09-19 NOTE — Chronic Care Management (AMB) (Signed)
Chronic Care Management    Clinical Social Work Follow Up Note  09/19/2019 Name: Pamela Hughes MRN: [id number] DOB: 09/27/1940  Pamela Hughes is a 79 y.o. year old female who is a primary care patient of Dettinger, Elige Radon, MD. The CCM team was consulted for assistance with Pamela Hughes .   Review of patient status, including review of consultants reports, other relevant assessments, and collaboration with appropriate care team members and the patient's provider was performed as part of comprehensive patient evaluation and provision of chronic care management services.    SDOH (Social Determinants of Health) assessments performed: Yes;risk for tobacco use; risk for stress; risk for depression    Virtual BH Phone Follow Up from 08/02/2017 in [facility]  PHQ-9 Total Score  10     GAD 7 : Generalized Anxiety Score 08/02/2017 07/12/2017  Nervous, Anxious, on Edge 0 0  Control/stop worrying 0 0  Worry too much - different things 0 0  Trouble relaxing 0 0  Restless 0 0  Easily annoyed or irritable 0 0  Afraid - awful might happen 0 0  Total GAD 7 Score 0 0    Outpatient Encounter Medications as of 09/19/2019  Medication Sig  . amLODipine (NORVASC) 5 MG tablet Take 1 tablet (5 mg total) by mouth daily.  Marland Kitchen buPROPion (WELLBUTRIN SR) 150 MG 12 hr tablet Take 1 tablet (150 mg total) by mouth 2 (two) times daily. (Needs to be seen before next refill) (Patient taking differently: Take 150 mg by mouth daily. )  . gabapentin (NEURONTIN) 100 MG capsule Take 1 capsule (100 mg total) by mouth 3 (three) times daily. One three times daily  . HYDROcodone-acetaminophen (NORCO/VICODIN) 5-325 MG tablet One tablet every four hours for pain.  Marland Kitchen ipratropium (ATROVENT) 0.03 % nasal spray USE TWICE DAILY AS DIRECTED  . meclizine (ANTIVERT) 25 MG tablet TAKE ONE TABLET 3 TIMES A DAY AS NEEDED. (Patient taking differently: Take 25 mg by mouth 3 (three) times daily as needed for  dizziness. )  . metoprolol succinate (TOPROL-XL) 25 MG 24 hr tablet Take 25 mg by mouth daily.  . pantoprazole (PROTONIX) 40 MG tablet TAKE ONE TABLET EACH MORNING BEFORE BREAKFAST  . rosuvastatin (CRESTOR) 10 MG tablet Take 1 tablet (10 mg total) by mouth daily. (Needs to be seen before next refill)  . rosuvastatin (CRESTOR) 10 MG tablet Take 10 mg by mouth daily.  . traZODone (DESYREL) 150 MG tablet TAKE ONE TABLET DAILY AT BEDTIME (Patient taking differently: Take 150 mg by mouth at bedtime. )  . triamcinolone cream (KENALOG) 0.1 % Apply 1 application topically 2 (two) times daily.   No facility-administered encounter medications on file as of 09/19/2019.    Goals    . "I need help with getting food delivered to my home." (pt-stated)     Current Barriers:  Marland Kitchen Knowledge Deficits related to resources available to deliver food to her home.in patient with Chronic Diagnoses of HTN, Depression, DOE , and CVA  . Nurse Case Manager Clinical Goal(s): Over the next 7 days, patient will work with LCSW to address needs related to food delivery options.   Interventions:   PreviouslyLCSW provided education to patient re: ADTS and Meals On Wheels (waiting list)  Previously encouraged client to call [employer] to add her name to waiting list for Meals on Wheels.   Discussed current food needs of client   Talked with client about vision challenges of client  Talked with client  about ambulation needs (she said she gets dizzy on occasion)  Talked with client about transport needs of client  Talked with client about social support (client said she has social support from Pamela Hughes, cousin)  Talked with client about frozen meals she receives monthly through Meals on Wheels program  Talked with client about her current issues faced (she said she has challenges in dealing with allergies)  Talked with client about pain issues faced (she spoke of occasional pain in her left knee)  Patient Self Care  Activities:  Currently UNABLE TO independently drive due to dizziness s/p CVA but she is able to warm up meals and feed herself.    Client said she has a friend, Pamela Hughes, who occasionally helps client with meals   Plan: LCSW to call client in next 4 weeks to talk with client about food needs of client and about local food resources Client to participate in home health physical therapy sessions as scheduled Client to communicate with RN CM Pamela Hughes to discuss nursing needs of client. Client to attend scheduled client medical appointments  Initial goal documentation   Follow Up Plan: LCSW to call client in next 4 weeks to talk about food needs of client and about local food resources  Pamela Hughes.Pamela Hughes MSW, LCSW Licensed Clinical Social Worker [facility] [phone]

## 2019-09-19 NOTE — Patient Instructions (Addendum)
Licensed Clinical Social Worker Visit Information  Goals we discussed today:  Goals    . "I need help with getting food delivered to my home." (pt-stated)     Current Barriers:  Marland Kitchen Knowledge Deficits related to resources available to deliver food to her home.in patient with Chronic Diagnoses of HTN, Depression, DOE , and CVA  . Nurse Case Manager Clinical Goal(s): Over the next 7 days, patient will work with LCSW to address needs related to food delivery options.   Interventions:  Previously LCSW provided education to patient re: ADTS and Meals On Wheels (waiting list)  Previously encouraged client to call [employer] to add her name to waiting list for Meals on Wheels.  Discussed current food needs of client  Talked with client about vision challenges of client  Talked with client about ambulation needs (she said she gets dizzy on occasion)  Talked with client about transport needs of client  Talked with client about social support (client said she has social support from [name], cousin)  Talked with client about frozen meals she receives monthly through Meals on Wheels program  Talked with client about her current issues faced (she said she has challenges in dealing with allergies)  Talked with client about pain issues faced (she spoke of occasional pain in her left knee)   Patient Self Care Activities:  Currently UNABLE TO independently drive due to dizziness s/p CVA but she is able to warm up meals and feed herself.    Client said she has a friend, [name], who occasionally helps client with meals   Plan: LCSW to call client in next 4 weeks to talk with client about food needs of client and about local food resources Client to participate in home health physical therapy sessions as scheduled Client to communicate with RN CM [name] to discuss nursing needs of client. Client to attend scheduled client medical appointments  Initial goal documentation     Materials Provided:  No  Follow Up Plan: LCSW to call client in next 4 weeks to talk with client about food needs of  client and about local food resources  The patient verbalized understanding of instructions provided today and declined a print copy of patient instruction materials.   [name].Salli Bodin MSW, LCSW Licensed Clinical Social Worker [facility] [phone]

## 2019-09-19 NOTE — Progress Notes (Signed)
Virtual Visit via telephone Note  I connected with Pamela Hughes on 09/19/19 at 1030 by telephone and verified that I am speaking with the correct person using two identifiers. Pamela Hughes is currently located at home and no other people are currently with her during visit. The provider, Fransisca Kaufmann Tayja Manzer, MD is located in their office at time of visit.  Call ended at 1045  I discussed the limitations, risks, security and privacy concerns of performing an evaluation and management service by telephone and the availability of in person appointments. I also discussed with the patient that there may be a patient responsible charge related to this service. The patient expressed understanding and agreed to proceed.   History and Present Illness: Hypertension Patient is currently on amlodipine, and their blood pressure today is 145/70. Patient has the occasional lightheadedness. Patient denies headaches, blurred vision, chest pains, shortness of breath, or weakness. Denies any side effects from medication and is content with current medication.   GERD Patient is currently on pantaprozole.  She denies any major symptoms or abdominal pain or belching or burping. She denies any blood in her stool or lightheadedness or dizziness.   Hyperlipidemia Patient is coming in for recheck of his hyperlipidemia. The patient is currently taking crestor. They deny any issues with myalgias or history of liver damage from it. They deny any focal numbness or weakness or chest pain.   No diagnosis found.  Outpatient Encounter Medications as of 09/19/2019  Medication Sig  . amLODipine (NORVASC) 5 MG tablet Take 1 tablet (5 mg total) by mouth daily.  Marland Kitchen buPROPion (WELLBUTRIN SR) 150 MG 12 hr tablet Take 1 tablet (150 mg total) by mouth 2 (two) times daily. (Needs to be seen before next refill) (Patient taking differently: Take 150 mg by mouth daily. )  . gabapentin (NEURONTIN) 100 MG capsule Take 1 capsule (100 mg  total) by mouth 3 (three) times daily. One three times daily  . HYDROcodone-acetaminophen (NORCO/VICODIN) 5-325 MG tablet One tablet every four hours for pain.  Marland Kitchen ipratropium (ATROVENT) 0.03 % nasal spray USE TWICE DAILY AS DIRECTED  . meclizine (ANTIVERT) 25 MG tablet TAKE ONE TABLET 3 TIMES A DAY AS NEEDED. (Patient taking differently: Take 25 mg by mouth 3 (three) times daily as needed for dizziness. )  . metoprolol succinate (TOPROL-XL) 25 MG 24 hr tablet Take 25 mg by mouth daily.  . pantoprazole (PROTONIX) 40 MG tablet TAKE ONE TABLET EACH MORNING BEFORE BREAKFAST  . rosuvastatin (CRESTOR) 10 MG tablet Take 1 tablet (10 mg total) by mouth daily. (Needs to be seen before next refill)  . rosuvastatin (CRESTOR) 10 MG tablet Take 10 mg by mouth daily.  . traZODone (DESYREL) 150 MG tablet TAKE ONE TABLET DAILY AT BEDTIME (Patient taking differently: Take 150 mg by mouth at bedtime. )  . triamcinolone cream (KENALOG) 0.1 % Apply 1 application topically 2 (two) times daily.   No facility-administered encounter medications on file as of 09/19/2019.    Review of Systems  Constitutional: Negative for chills and fever.  HENT: Negative for congestion, ear discharge and ear pain.   Eyes: Negative for visual disturbance.  Respiratory: Negative for chest tightness and shortness of breath.   Cardiovascular: Negative for chest pain and leg swelling.  Genitourinary: Negative for difficulty urinating and dysuria.  Musculoskeletal: Negative for back pain and gait problem.  Skin: Negative for rash.  Neurological: Negative for light-headedness and headaches.  Psychiatric/Behavioral: Negative for agitation and behavioral problems.  All other systems reviewed and are negative.   Observations/Objective: Patient sounds comfortable and in no acute distress  Assessment and Plan: Problem List Items Addressed This Visit      Cardiovascular and Mediastinum   Hypertension - Primary   Relevant Medications    amLODipine (NORVASC) 2.5 MG tablet   rosuvastatin (CRESTOR) 10 MG tablet   Other Relevant Orders   CMP14+EGFR     Other   Status post CVA   Relevant Orders   CBC with Differential/Platelet   Dyslipidemia (high LDL; low HDL)   Relevant Medications   rosuvastatin (CRESTOR) 10 MG tablet   Other Relevant Orders   Lipid panel      Continue current medication, no change Follow up plan: Return in about 6 months (around 03/20/2020), or if symptoms worsen or fail to improve, for htn and hld.     I discussed the assessment and treatment plan with the patient. The patient was provided an opportunity to ask questions and all were answered. The patient agreed with the plan and demonstrated an understanding of the instructions.   The patient was advised to call back or seek an in-person evaluation if the symptoms worsen or if the condition fails to improve as anticipated.  The above assessment and management plan was discussed with the patient. The patient verbalized understanding of and has agreed to the management plan. Patient is aware to call the clinic if symptoms persist or worsen. Patient is aware when to return to the clinic for a follow-up visit. Patient educated on when it is appropriate to go to the emergency department.    I provided 15 minutes of non-face-to-face time during this encounter.    Worthy Rancher, MD

## 2019-09-20 ENCOUNTER — Other Ambulatory Visit: Payer: Self-pay

## 2019-09-20 ENCOUNTER — Ambulatory Visit (INDEPENDENT_AMBULATORY_CARE_PROVIDER_SITE_OTHER): Payer: Medicare Other | Admitting: *Deleted

## 2019-09-20 VITALS — BP 140/80 | Ht 65.0 in | Wt 140.0 lb

## 2019-09-20 DIAGNOSIS — Z0000 Encounter for general adult medical examination without abnormal findings: Secondary | ICD-10-CM | POA: Diagnosis not present

## 2019-09-20 NOTE — Progress Notes (Signed)
MEDICARE ANNUAL WELLNESS VISIT  09/20/2019  Telephone Visit Disclaimer This Medicare AWV was conducted by telephone due to national recommendations for restrictions regarding the COVID-19 Pandemic (e.g. social distancing).  I verified, using two identifiers, that I am speaking with Pamela Hughes or their authorized healthcare agent. I discussed the limitations, risks, security, and privacy concerns of performing an evaluation and management service by telephone and the potential availability of an in-person appointment in the future. The patient expressed understanding and agreed to proceed.   Subjective:  Pamela Hughes is a 79 y.o. female patient of Dettinger, Elige Radon, MD who had a Medicare Annual Wellness Visit today via telephone. Kelsey is retired and lives alone. she has 1 child. she reports that she is socially active and does interact with friends/family regularly. she is not physically active and enjoys reading.  Patient Care Team: Dettinger, Elige Radon, MD as PCP - General (Family Medicine) Laqueta Linden, MD as PCP - Cardiology (Cardiology) Malissa Hippo, MD as Consulting Physician (Gastroenterology) Gwenith Daily, RN as Registered Nurse Randa Spike, [name], LCSW as Social Worker (Licensed Clinical Social Worker) Darreld Mclean, MD as Attending Physician (Orthopedic Surgery)  Advanced Directives 09/20/2019 12/22/2018 12/15/2018 09/26/2018 09/21/2018 02/17/2018 01/05/2018  Does Patient Have a Medical Advance Directive? No No No No No No No  Does patient want to make changes to medical advance directive? - - - - - - -  Would patient like information on creating a medical advance directive? No - Patient declined No - Patient declined No - Patient declined No - Patient declined No - Patient declined - No - Patient declined    Hospital Utilization Over the Past 12 Months: # of hospitalizations or ER visits: 1 # of surgeries: 0  Review of Systems    Patient reports that her  overall health is worse compared to last year.  General ROS: negative  Patient Reported Readings (BP, Pulse, CBG, Weight, etc) BP 140/80 Hughes: per pt - avg home reading  Ht 5\' 5"  (1.651 m)   Wt 140 lb (63.5 kg)   BMI 23.30 kg/m    Pain Assessment       Current Medications & Allergies (verified) Allergies as of 09/20/2019   No Known Allergies     Medication List       Accurate as of September 20, 2019  2:56 PM. If you have any questions, ask your nurse or doctor.        STOP taking these medications   gabapentin 100 MG capsule Commonly known as: Neurontin   HYDROcodone-acetaminophen 5-325 MG tablet Commonly known as: NORCO/VICODIN     TAKE these medications   albuterol 108 (90 Base) MCG/ACT inhaler Commonly known as: VENTOLIN HFA Inhale 2 puffs into the lungs every 6 (six) hours as needed for wheezing or shortness of breath.   amLODipine 2.5 MG tablet Commonly known as: NORVASC Take 1 tablet (2.5 mg total) by mouth daily.   buPROPion 150 MG 12 hr tablet Commonly known as: WELLBUTRIN SR Take 1 tablet (150 mg total) by mouth 2 (two) times daily. (Needs to be seen before next refill) What changed:   when to take this  additional instructions   ipratropium 0.03 % nasal spray Commonly known as: ATROVENT Place 1 spray into both nostrils 2 (two) times daily. as directed   meclizine 25 MG tablet Commonly known as: ANTIVERT TAKE ONE TABLET 3 TIMES A DAY AS NEEDED.   metoprolol succinate 25 MG  24 hr tablet Commonly known as: TOPROL-XL Take 25 mg by mouth daily.   pantoprazole 40 MG tablet Commonly known as: PROTONIX TAKE ONE TABLET EACH MORNING BEFORE BREAKFAST   rosuvastatin 10 MG tablet Commonly known as: CRESTOR Take 1 tablet (10 mg total) by mouth daily. (Needs to be seen before next refill)   traZODone 150 MG tablet Commonly known as: DESYREL Take 1 tablet (150 mg total) by mouth at bedtime.   triamcinolone cream 0.1 % Commonly known as:  KENALOG Apply 1 application topically 2 (two) times daily.       History (reviewed): Past Medical History:  Diagnosis Date  . Allergy   . Alopecia   . Anxiety   . Arthritis   . Asthma   . Collagenous colitis   . Depression   . Diarrhea    chronic- "states for at least a year)  . Edema   . GERD (gastroesophageal reflux disease)   . HOH (hard of hearing)   . Hyperlipidemia   . Hypertension    Past Surgical History:  Procedure Laterality Date  . ABDOMINAL HYSTERECTOMY     one ovary left  . CATARACT EXTRACTION W/PHACO Right 02/17/2018   Procedure: CATARACT EXTRACTION PHACO AND INTRAOCULAR LENS PLACEMENT (IOC);  Surgeon: Fabio Pierce, MD;  Location: [facility];  Service: Ophthalmology;  Laterality: Right;  CDE: 4.84  . CATARACT EXTRACTION W/PHACO Left 03/31/2018   Procedure: CATARACT EXTRACTION PHACO AND INTRAOCULAR LENS PLACEMENT (IOC) LEFT CDE: 4.51;  Surgeon: Fabio Pierce, MD;  Location: [facility];  Service: Ophthalmology;  Laterality: Left;  . COLONOSCOPY N/A 12/17/2016   Procedure: COLONOSCOPY;  Surgeon: Malissa Hippo, MD;  Location: [facility];  Service: Endoscopy;  Laterality: N/A;  2:05  . SPINE SURGERY  1990s   L spine   Family History  Problem Relation Age of Onset  . Alzheimer's disease Mother   . Heart disease Mother   . Stroke Mother   . Blindness Mother   . Alcohol abuse Father   . Diabetes Brother   . Healthy Daughter    Social History   Socioeconomic History  . Marital status: Divorced    Spouse name: Not on file  . Number of children: 1  . Years of education: 32  . Highest education level: 10th grade  Occupational History  . Occupation: Retired    Hughes: Designer, fashion/clothing  Tobacco Use  . Smoking status: Former Smoker    Packs/day: 0.25    Years: 5.00    Pack years: 1.25    Types: Cigarettes    Quit date: 10/09/1974    Years since quitting: 44.9  . Smokeless tobacco: Never Used  Substance and Sexual Activity  . Alcohol use: Never  . Drug use: No   . Sexual activity: Yes    Birth control/protection: Surgical  Other Topics Concern  . Not on file  Social History Narrative   Lives alone    One dog    Social Determinants of Health   Financial Resource Strain:   . Difficulty of Paying Living Expenses:   Food Insecurity: Unknown  . Worried About Programme researcher, broadcasting/film/video in the Last Year: Not on file  . Ran Out of Food in the Last Year: Never true  Transportation Needs:   . Lack of Transportation (Medical):   Marland Kitchen Lack of Transportation (Non-Medical):   Physical Activity: Unknown  . Days of Exercise per Week: Not on file  . Minutes of Exercise per Session: 0 min  Stress:  Stress Concern Present  . Feeling of Stress : To some extent  Social Connections:   . Frequency of Communication with Friends and Family:   . Frequency of Social Gatherings with Friends and Family:   . Attends Religious Services:   . Active Member of Clubs or Organizations:   . Attends Archivist Meetings:   Marland Kitchen Marital Status:     Activities of Daily Living In your present state of health, do you have any difficulty performing the following activities: 09/20/2019 12/15/2018  Hearing? Y Y  Hughes no hearing aids / may get some -  Vision? Y N  Hughes reading glasses -  Difficulty concentrating or making decisions? Y Y  Hughes sometimes -  Walking or climbing stairs? N N  Dressing or bathing? N Y  Doing errands, shopping? Y N  Hughes no longer drives -  Conservation officer, nature and eating ? N -  Using the Toilet? N -  In the past six months, have you accidently leaked urine? N -  Do you have problems with loss of bowel control? N -  Managing your Medications? N -  Managing your Finances? N -  Housekeeping or managing your Housekeeping? N -  Some recent data might be hidden    Patient Education/ Literacy    Exercise Current Exercise Habits: The patient does not participate in regular exercise at present, Exercise limited by: None  identified  Diet Patient reports consuming 2 meals a day and 2 snack(s) a day Patient reports that her primary diet is: Regular Patient reports that she does not have regular access to food.   Depression Screen PHQ 2/9 Scores 09/20/2019 12/06/2018 09/26/2018 03/21/2018 12/14/2017 12/05/2017 08/02/2017  PHQ - 2 Score 1 0 0 0 1 1 4   PHQ- 9 Score - - - - - - 10     Fall Risk Fall Risk  09/20/2019 12/06/2018 03/21/2018 12/14/2017 12/05/2017  Falls in the past year? 0 0 No No No     Objective:  [name] seemed alert and oriented and she participated appropriately during our telephone visit.  Blood Pressure Weight BMI  BP Readings from Last 3 Encounters:  09/20/19 140/80  08/09/19 (!) 160/63  07/17/19 (!) 159/81   Wt Readings from Last 3 Encounters:  09/20/19 140 lb (63.5 kg)  08/09/19 139 lb (63 kg)  07/17/19 139 lb 2 oz (63.1 kg)   BMI Readings from Last 1 Encounters:  09/20/19 23.30 kg/m    *Unable to obtain current vital signs, weight, and BMI due to telephone visit type  Hearing/Vision  . Arina did not seem to have difficulty with hearing/understanding during the telephone conversation . Reports that she has not had a formal eye exam by an eye care professional within the past year . Reports that she has not had a formal hearing evaluation within the past year *Unable to fully assess hearing and vision during telephone visit type  Cognitive Function: 6CIT Screen 09/20/2019  What Year? 0 points  What month? 0 points  What time? 0 points  Count back from 20 0 points  Months in reverse 2 points  Repeat phrase 0 points  Total Score 2   (Normal:0-7, Significant for Dysfunction: >8)  Normal Cognitive Function Screening: Yes   Immunization & Health Maintenance Record Immunization History  Administered Date(s) Administered  . Influenza, High Dose Seasonal PF 02/25/2016, 03/03/2017, 03/21/2018, 03/22/2019  . Influenza,inj,Quad PF,6+ Mos 02/27/2015  . Influenza-Unspecified  03/18/2014    Health Maintenance  Topic Date Due  . COVID-19 Vaccine (1) Never done  . PNA vac Low Risk Adult (1 of 2 - PCV13) Never done  . MAMMOGRAM  07/27/2019  . TETANUS/TDAP  12/06/2019 (Originally 07/27/1959)  . INFLUENZA VACCINE  12/30/2019  . DEXA SCAN  Completed       Assessment  This is a routine wellness examination for [employer].  Health Maintenance: Due or Overdue Health Maintenance Due  Topic Date Due  . COVID-19 Vaccine (1) Never done  . PNA vac Low Risk Adult (1 of 2 - PCV13) Never done  . MAMMOGRAM  07/27/2019    Pamela Hughes does not need a referral for Community Assistance: Care Management:   no Social Work:    no Prescription Assistance:  no Nutrition/Diabetes Education:  no   Plan:  Personalized Goals Goals Addressed            This Visit's Progress   . "I'm still a little dizzy and I don't want to fall" (pt-stated)   On track    Current Barriers:   Knowledge Deficits related to physiological changes associated with CVA that may increase dizziness and affect balance   No assistive devices at home  Nurse Case Manager Clinical Goal(s):  Marland Kitchen Over the next 7 days, patient will verbalize understanding of plan for fall risk reduction. . Over the next 7 days, patient will work with Medical illustrator and DME Supplier to address needs related to walker procurement  Interventions:  . Provided education to patient re: Fall risks in the home and risk reduction techniques. . Reviewed medications with patient and discussed antivert and scopalamine patches that were prescribed previously for dizziness. Also discussed her increased risk for bleeding with a fall/head injury while on Plavix.  . Collaborated with Fawn Kirk, LPN  regarding how to obtain a walker.  Patient Self Care Activities:  . Currently UNABLE TO independently drive but she is able to do most ADLs independently   Initial goal documentation      . Prevent falls        Personalized  Health Maintenance & Screening Recommendations  Pneumococcal vaccine ( states she thinks she has had)   Lung Cancer Screening Recommended: no (Low Dose CT Chest recommended if Age 106-80 years, 30 pack-year currently smoking OR have quit w/in past 15 years) Hepatitis C Screening recommended: not applicable HIV Screening recommended: no  Advanced Directives: Written information was not prepared per patient's request.  Referrals & Orders No orders of the defined types were placed in this encounter.   Follow-up Plan . Follow-up with Dettinger, Elige Radon, MD as planned   I have personally reviewed and noted the following in the patient's chart:   . Medical and social history . Use of alcohol, tobacco or illicit drugs  . Current medications and supplements . Functional ability and status . Nutritional status . Physical activity . Advanced directives . List of other physicians . Hospitalizations, surgeries, and ER visits in previous 12 months . Vitals . Screenings to include cognitive, depression, and falls . Referrals and appointments  In addition, I have reviewed and discussed with Pamela Hughes certain preventive protocols, quality metrics, and best practice recommendations. A written personalized care plan for preventive services as well as general preventive health recommendations is available and can be mailed to the patient at her request.      Leilani Merl  09/20/2019

## 2019-09-20 NOTE — Patient Instructions (Signed)
Pamela Hughes , Thank you for taking time to come for your Medicare Wellness Visit. I appreciate your ongoing commitment to your health goals. Please review the following plan we discussed and let me know if I can assist you in the future.   These are the goals we discussed: Goals    . "I need help with getting food delivered to my home." (pt-stated)     Current Barriers:  Marland Kitchen Knowledge Deficits related to resources available to deliver food to her home.in patient with Chronic Diagnoses of HTN, Depression, DOE , and CVA  . Nurse Case Manager Clinical Goal(s): Over the next 7 days, patient will work with LCSW to address needs related to food delivery options.   Interventions:  . LCSW provided education to patient re: ADTS and Meals On Wheels (waiting list) . Encouraged client to call ADTS to add her name to waiting list for Meals on Wheels.  . Discussed current food arrangements. She is signed up with [employer] in [place] and they deliver a food box. Her daughter and friend also brings things to her as needed.  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    Client said she has a friend, [name], who occasionally helps client with meals   Plan: LCSW to call client in next 3 weeks to talk with client about food needs of client and about local food resources Client to participate in home health physical therapy sessions as scheduled Client to communicate with RN CM [name] to discuss nursing needs of client. Client to attend scheduled client medical appointments  Initial goal documentation      . "I'm still a little dizzy and I don't want to fall" (pt-stated)     Current Barriers:   Knowledge Deficits related to physiological changes associated with CVA that may increase dizziness and affect balance   No assistive devices at  home  Nurse Case Manager Clinical Goal(s):  Marland Kitchen Over the next 7 days, patient will verbalize understanding of plan for fall risk reduction. . Over the next 7 days, patient will work with Medical illustrator and DME Supplier to address needs related to walker procurement  Interventions:  . Provided education to patient re: Fall risks in the home and risk reduction techniques. . Reviewed medications with patient and discussed antivert and scopalamine patches that were prescribed previously for dizziness. Also discussed her increased risk for bleeding with a fall/head injury while on Plavix.  . Collaborated with Fawn Kirk, LPN  regarding how to obtain a walker.  Patient Self Care Activities:  . Currently UNABLE TO independently drive but she is able to do most ADLs independently   Initial goal documentation      . Exercise 150 min/wk Moderate Activity     Do chair exercises daily. Refer to handout.     . Prevent falls       This is a list of the screening recommended for you and due dates:  Health Maintenance  Topic Date Due  . COVID-19 Vaccine (1) Never done  . Pneumonia vaccines (1 of 2 - PCV13) Never done  . Mammogram  07/27/2019  . Tetanus Vaccine  12/06/2019*  . Flu Shot  12/30/2019  . DEXA scan (bone density measurement)  Completed  *Topic was postponed. The date shown is not the original due  date.

## 2019-09-23 ENCOUNTER — Other Ambulatory Visit: Payer: Self-pay | Admitting: Family Medicine

## 2019-09-28 ENCOUNTER — Other Ambulatory Visit: Payer: Medicare Other

## 2019-09-28 ENCOUNTER — Other Ambulatory Visit: Payer: Self-pay

## 2019-09-28 DIAGNOSIS — E785 Hyperlipidemia, unspecified: Secondary | ICD-10-CM

## 2019-09-28 DIAGNOSIS — I10 Essential (primary) hypertension: Secondary | ICD-10-CM | POA: Diagnosis not present

## 2019-09-28 DIAGNOSIS — Z8673 Personal history of transient ischemic attack (TIA), and cerebral infarction without residual deficits: Secondary | ICD-10-CM

## 2019-09-29 LAB — CBC WITH DIFFERENTIAL/PLATELET
Basophils Absolute: 0.1 10*3/uL (ref 0.0–0.2)
Basos: 1 %
EOS (ABSOLUTE): 0.2 10*3/uL (ref 0.0–0.4)
Eos: 4 %
Hematocrit: 37 % (ref 34.0–46.6)
Hemoglobin: 12.6 g/dL (ref 11.1–15.9)
Immature Grans (Abs): 0 10*3/uL (ref 0.0–0.1)
Immature Granulocytes: 0 %
Lymphocytes Absolute: 1.5 10*3/uL (ref 0.7–3.1)
Lymphs: 24 %
MCH: 32.1 pg (ref 26.6–33.0)
MCHC: 34.1 g/dL (ref 31.5–35.7)
MCV: 94 fL (ref 79–97)
Monocytes Absolute: 0.8 10*3/uL (ref 0.1–0.9)
Monocytes: 12 %
Neutrophils Absolute: 3.8 10*3/uL (ref 1.4–7.0)
Neutrophils: 59 %
Platelets: 307 10*3/uL (ref 150–450)
RBC: 3.93 x10E6/uL (ref 3.77–5.28)
RDW: 11.8 % (ref 11.7–15.4)
WBC: 6.4 10*3/uL (ref 3.4–10.8)

## 2019-09-29 LAB — CMP14+EGFR
ALT: 12 IU/L (ref 0–32)
AST: 19 IU/L (ref 0–40)
Albumin/Globulin Ratio: 1.8 (ref 1.2–2.2)
Albumin: 4.2 g/dL (ref 3.7–4.7)
Alkaline Phosphatase: 107 IU/L (ref 39–117)
BUN/Creatinine Ratio: 19 (ref 12–28)
BUN: 25 mg/dL (ref 8–27)
Bilirubin Total: 0.3 mg/dL (ref 0.0–1.2)
CO2: 23 mmol/L (ref 20–29)
Calcium: 9.4 mg/dL (ref 8.7–10.3)
Chloride: 97 mmol/L (ref 96–106)
Creatinine, Ser: 1.35 mg/dL — ABNORMAL HIGH (ref 0.57–1.00)
GFR calc Af Amer: 43 mL/min/{1.73_m2} — ABNORMAL LOW (ref >59)
GFR calc non Af Amer: 37 mL/min/{1.73_m2} — ABNORMAL LOW (ref >59)
Globulin, Total: 2.4 g/dL (ref 1.5–4.5)
Glucose: 72 mg/dL (ref 65–99)
Potassium: 4.2 mmol/L (ref 3.5–5.2)
Sodium: 139 mmol/L (ref 134–144)
Total Protein: 6.6 g/dL (ref 6.0–8.5)

## 2019-09-29 LAB — LIPID PANEL
Chol/HDL Ratio: 1.9 ratio (ref 0.0–4.4)
Cholesterol, Total: 172 mg/dL (ref 100–199)
HDL: 89 mg/dL (ref >39)
LDL Chol Calc (NIH): 67 mg/dL (ref 0–99)
Triglycerides: 87 mg/dL (ref 0–149)
VLDL Cholesterol Cal: 16 mg/dL (ref 5–40)

## 2019-10-16 ENCOUNTER — Other Ambulatory Visit: Payer: Self-pay | Admitting: Family Medicine

## 2019-10-16 ENCOUNTER — Other Ambulatory Visit: Payer: Self-pay

## 2019-10-16 NOTE — Telephone Encounter (Signed)
Covering provider for Dettinger:  Last office visit 09/19/2019  Trazodone last refill 09/19/2019, #30, no refills

## 2019-10-19 ENCOUNTER — Ambulatory Visit (INDEPENDENT_AMBULATORY_CARE_PROVIDER_SITE_OTHER): Payer: Medicare Other | Admitting: Licensed Clinical Social Worker

## 2019-10-19 DIAGNOSIS — I10 Essential (primary) hypertension: Secondary | ICD-10-CM

## 2019-10-19 DIAGNOSIS — R0609 Other forms of dyspnea: Secondary | ICD-10-CM

## 2019-10-19 DIAGNOSIS — Z8673 Personal history of transient ischemic attack (TIA), and cerebral infarction without residual deficits: Secondary | ICD-10-CM

## 2019-10-19 NOTE — Patient Instructions (Addendum)
Licensed Clinical Social Worker Visit Information  Goals we discussed today:  Goals    . "I need help with getting food delivered to my home." (pt-stated)     Current Barriers:  Marland Kitchen Knowledge Deficits related to resources available to deliver food to her home.in patient with Chronic Diagnoses of HTN, Depression, DOE , and CVA  . Nurse Case Manager Clinical Goal(s): Over the next 7 days, patient will work with LCSW to address needs related to food delivery options.   Interventions:   Discussed current foodneeds of client  Talked with client about vision challenges of client  Talked with client about ambulation needs (she said she gets dizzy on occasion)  Talked with client about transport needs of client  Talked with client about social support (client said she has social support from [name], cousin)  Talked with client about frozen meals she receives monthly through Meals on Wheels program  Talked with client about her current issues faced (she said she has challenges in dealing with allergies)  Talked with client about pain issues faced (she spoke of occasional pain in her left knee)  Talked with client about relaxation techniques (she likes to be on FACEBOOK and likes to talk with family or friends on the phone)  Talked with client about upcoming client medical appointments  Talked with client about her decreased energy  Patient Self Care Activities:  Currently UNABLE TO independently drive due to dizziness s/p CVA but she is able to warm up meals and feed herself.    Client said she has a friend, [name], who occasionally helps client with meals   Plan: LCSW to call client in next 4 weeks to talk with client about food needs of client and about local food resources Client to participate in home health physical therapy sessions as scheduled Client to communicate with RN CM [name] to discuss nursing needs of client. Client to attend scheduled client medical  appointments  Initial goal documentation    Materials Provided: No  Follow Up Plan: LCSW to call client in next 4 weeks to talk with client about food needs of  client and about local food resources  The patient verbalized understanding of instructions provided today and declined a print copy of patient instruction materials.   Pamela Hughes.Pamela Hughes MSW, LCSW Licensed Clinical Social Worker [facility] Care [facility] [phone]

## 2019-10-19 NOTE — Chronic Care Management (AMB) (Signed)
Chronic Care Management    Clinical Social Work Follow Up Note  10/19/2019 Name: Pamela Hughes MRN: [id number] DOB: 02-13-1941  Pamela Hughes is a 79 y.o. year old female who is a primary care patient of Dettinger, Elige Radon, MD. The CCM team was consulted for assistance with Pamela Hughes .   Review of patient status, including review of consultants reports, other relevant assessments, and collaboration with appropriate care team members and the patient's provider was performed as part of comprehensive patient evaluation and provision of chronic care management services.    SDOH (Social Determinants of Health) assessments performed: Yes;risk for tobacco use; risk for depression    Virtual BH Phone Follow Up from 08/02/2017 in [facility]  PHQ-9 Total Score  10      GAD 7 : Generalized Anxiety Score 08/02/2017 07/12/2017  Nervous, Anxious, on Edge 0 0  Control/stop worrying 0 0  Worry too much - different things 0 0  Trouble relaxing 0 0  Restless 0 0  Easily annoyed or irritable 0 0  Afraid - awful might happen 0 0  Total GAD 7 Score 0 0   Outpatient Encounter Medications as of 10/19/2019  Medication Sig  . meclizine (ANTIVERT) 25 MG tablet TAKE ONE TABLET 3 TIMES A DAY AS NEEDED.  . metoprolol succinate (TOPROL-XL) 25 MG 24 hr tablet TAKE ONE (1) TABLET EACH DAY  . traZODone (DESYREL) 150 MG tablet TAKE ONE TABLET DAILY AT BEDTIME  . albuterol (VENTOLIN HFA) 108 (90 Base) MCG/ACT inhaler Inhale 2 puffs into the lungs every 6 (six) hours as needed for wheezing or shortness of breath.  Marland Kitchen amLODipine (NORVASC) 2.5 MG tablet Take 1 tablet (2.5 mg total) by mouth daily.  Marland Kitchen buPROPion (WELLBUTRIN SR) 150 MG 12 hr tablet Take 1 tablet (150 mg total) by mouth 2 (two) times daily. (Needs to be seen before next refill) (Patient taking differently: Take 150 mg by mouth daily. )  . ipratropium (ATROVENT) 0.03 % nasal spray Place 1 spray into both nostrils 2 (two) times  daily. as directed  . omeprazole (PRILOSEC) 20 MG capsule TAKE ONE (1) CAPSULE EACH DAY  . omeprazole (PRILOSEC) 20 MG capsule Take 20 mg by mouth daily.  . pantoprazole (PROTONIX) 40 MG tablet TAKE ONE TABLET EACH MORNING BEFORE BREAKFAST  . rosuvastatin (CRESTOR) 10 MG tablet Take 1 tablet (10 mg total) by mouth daily. (Needs to be seen before next refill)  . triamcinolone cream (KENALOG) 0.1 % Apply 1 application topically 2 (two) times daily.   No facility-administered encounter medications on file as of 10/19/2019.   Goals    . "I need help with getting food delivered to my home." (pt-stated)     Current Barriers:  Marland Kitchen Knowledge Deficits related to resources available to deliver food to her home.in patient with Chronic Diagnoses of HTN, Depression, DOE , and CVA  . Nurse Case Manager Clinical Goal(s): Over the next 7 days, patient will work with LCSW to address needs related to food delivery options.   Interventions:   Discussed current foodneeds of client  Talked with client about vision challenges of client  Talked with client about ambulation needs (she said she gets dizzy on occasion)  Talked with client about transport needs of client  Talked with client about social support (client said she has social support from Pamela Hughes, cousin)  Talked with client about frozen meals she receives monthly through Meals on Wheels program  Talked with client about  her current issues faced (she said she has challenges in dealing with allergies)  Talked with client about pain issues faced (she spoke of occasional pain in her left knee)  Talked with client about relaxation techniques (she likes to be on FACEBOOK and likes to talk with family or friends on the phone)  Talked with client about upcoming client medical appointments  Talked with client about her decreased energy  Patient Self Care Activities:  Currently UNABLE TO independently drive due to dizziness s/p CVA but she is  able to warm up meals and feed herself.    Client said she has a friend, Pamela Hughes, who occasionally helps client with meals   Plan: LCSW to call client in next 4 weeks to talk with client about food needs of client and about local food resources Client to participate in home health physical therapy sessions as scheduled Client to communicate with RN CM Pamela Hughes to discuss nursing needs of client. Client to attend scheduled client medical appointments  Initial goal documentation     Follow Up Plan: LCSW to call client in next 4 weeks to talk with client about food needs of client and about local food resources  Pamela Hughes MSW, LCSW Licensed Clinical Social Worker [facility] Care [facility] [phone]

## 2019-10-25 ENCOUNTER — Ambulatory Visit: Payer: Medicare Other | Admitting: *Deleted

## 2019-10-25 DIAGNOSIS — Z9181 History of falling: Secondary | ICD-10-CM

## 2019-10-25 DIAGNOSIS — I10 Essential (primary) hypertension: Secondary | ICD-10-CM

## 2019-10-25 DIAGNOSIS — Z8673 Personal history of transient ischemic attack (TIA), and cerebral infarction without residual deficits: Secondary | ICD-10-CM

## 2019-10-25 DIAGNOSIS — R42 Dizziness and giddiness: Secondary | ICD-10-CM

## 2019-10-25 NOTE — Patient Instructions (Signed)
Visit Information  Goals Addressed            This Visit's Progress     Patient Stated   . "I'm still a little dizzy and I don't want to fall" (pt-stated)       Current Barriers:   Knowledge Deficits related to physiological changes associated with CVA that may increase dizziness and affect balance    Nurse Case Manager Clinical Goal(s):  Marland Kitchen Over the next 30 days, patient will use assistive devices (walker) to help with balance and reduce fall risk . Over the next 30 days, patient will take meclizine as prescribed for vertigo  Interventions:  . Chart reviewed including recent office notes and lab results . Discussed lab results with patient o Hemoglobin and liver functions are now normal and kidney functions have improved - Patient was concerned that anemia may have been contributing to dizziness . Previously provided education to patient re: Fall risks in the home and risk reduction techniques. . Reviewed medications with patient and discussed meclizine and that she can take it up to 3 times a day. Cautioned that it may cause drowsiness . Verified that patient did receive walker and is using it for ambulation . Encouraged patient to increase daily activity level slowly to build up strength and stamina and to use walker with all ambulation . Provided with CCM contact information and encouraged to reach out as needed . Encouraged patient to reach out to PCP with any new or worsening symptoms  Patient Self Care Activities:  . Currently UNABLE TO independently drive but she is able to do most ADLs independently   Please see past updates related to this goal by clicking on the "Past Updates" button in the selected goal          The patient verbalized understanding of instructions provided today and declined a print copy of patient instruction materials.   Follow-up Plan The care management team will reach out to the patient again over the next 45 days.   [name], BSN,  RN-BC Embedded Chronic Care Manager [facility] / [facility] Care Management Direct Dial: [phone]

## 2019-10-25 NOTE — Chronic Care Management (AMB) (Signed)
Chronic Care Management   Follow Up Note   10/25/2019 Name: Pamela Hughes MRN: [id number] DOB: [birth year]  Referred by: Dettinger, Fransisca Kaufmann, MD Reason for referral : Chronic Care Management (RN follow-up)   Pamela Hughes is a 79 y.o. year old female who is a primary care patient of Dettinger, Fransisca Kaufmann, MD. The CCM team was consulted for assistance with chronic disease management and care coordination needs.    Review of patient status, including review of consultants reports, relevant laboratory and other test results, and collaboration with appropriate care team members and the patient's provider was performed as part of comprehensive patient evaluation and provision of chronic care management services.    SDOH (Social Determinants of Health) assessments performed: No See Care Plan activities for detailed interventions related to [facility])     Outpatient Encounter Medications as of 10/25/2019  Medication Sig  . meclizine (ANTIVERT) 25 MG tablet TAKE ONE TABLET 3 TIMES A DAY AS NEEDED.  . metoprolol succinate (TOPROL-XL) 25 MG 24 hr tablet TAKE ONE (1) TABLET EACH DAY  . traZODone (DESYREL) 150 MG tablet TAKE ONE TABLET DAILY AT BEDTIME  . albuterol (VENTOLIN HFA) 108 (90 Base) MCG/ACT inhaler Inhale 2 puffs into the lungs every 6 (six) hours as needed for wheezing or shortness of breath.  Marland Kitchen amLODipine (NORVASC) 2.5 MG tablet Take 1 tablet (2.5 mg total) by mouth daily.  Marland Kitchen buPROPion (WELLBUTRIN SR) 150 MG 12 hr tablet Take 1 tablet (150 mg total) by mouth 2 (two) times daily. (Needs to be seen before next refill) (Patient taking differently: Take 150 mg by mouth daily. )  . ipratropium (ATROVENT) 0.03 % nasal spray Place 1 spray into both nostrils 2 (two) times daily. as directed  . omeprazole (PRILOSEC) 20 MG capsule TAKE ONE (1) CAPSULE EACH DAY  . omeprazole (PRILOSEC) 20 MG capsule Take 20 mg by mouth daily.  . pantoprazole (PROTONIX) 40 MG tablet TAKE ONE TABLET EACH MORNING BEFORE  BREAKFAST  . rosuvastatin (CRESTOR) 10 MG tablet Take 1 tablet (10 mg total) by mouth daily. (Needs to be seen before next refill)  . triamcinolone cream (KENALOG) 0.1 % Apply 1 application topically 2 (two) times daily.   No facility-administered encounter medications on file as of 10/25/2019.     RN Care Manager   . "I'm still a little dizzy and I don't want to fall" (pt-stated)       Current Barriers:   Knowledge Deficits related to physiological changes associated with CVA that may increase dizziness and affect balance    Care management needs related to dizziness in a patient with HTN and a hx of CVA  Nurse Case Manager Clinical Goal(s):  Marland Kitchen Over the next 30 days, patient will use assistive devices (walker) to help with balance and reduce fall risk . Over the next 30 days, patient will take meclizine as prescribed for vertigo  Interventions:  . Chart reviewed including recent office notes and lab results . Discussed lab results with patient o Hemoglobin and liver functions are now normal and kidney functions have improved - Patient was concerned that anemia may have been contributing to dizziness . Previously provided education to patient re: Fall risks in the home and risk reduction techniques. . Reviewed medications with patient and discussed meclizine and that she can take it up to 3 times a day. Cautioned that it may cause drowsiness . Verified that patient did receive walker and is using it for ambulation . Encouraged patient to  increase daily activity level slowly to build up strength and stamina and to use walker with all ambulation . Provided with CCM contact information and encouraged to reach out as needed . Encouraged patient to reach out to PCP with any new or worsening symptoms  Patient Self Care Activities:  . Currently UNABLE TO independently drive but she is able to do most ADLs independently   Please see past updates related to this goal by clicking on the "Past  Updates" button in the selected goal           Plan:   The care management team will reach out to the patient again over the next 45 days.   [name], BSN, RN-BC Embedded Chronic Care Manager [facility] / [facility] Care Management Direct Dial: [phone]

## 2019-11-14 ENCOUNTER — Ambulatory Visit (INDEPENDENT_AMBULATORY_CARE_PROVIDER_SITE_OTHER): Payer: Medicare Other | Admitting: Licensed Clinical Social Worker

## 2019-11-14 DIAGNOSIS — R0609 Other forms of dyspnea: Secondary | ICD-10-CM

## 2019-11-14 DIAGNOSIS — I10 Essential (primary) hypertension: Secondary | ICD-10-CM

## 2019-11-14 DIAGNOSIS — Z8673 Personal history of transient ischemic attack (TIA), and cerebral infarction without residual deficits: Secondary | ICD-10-CM

## 2019-11-14 NOTE — Patient Instructions (Addendum)
Licensed Clinical Social Worker Visit Information  Goals we discussed today:  Goals    .  "I need help with getting food delivered to my home." (pt-stated)      Current Barriers:  Marland Kitchen Knowledge Deficits related to resources available to deliver food to her home.in patient with Chronic Diagnoses of HTN, Depression, DOE , and CVA  . Nurse Case Manager Clinical Goal(s): Over the next 7 days, patient will work with LCSW to address needs related to food delivery options.   Interventions:   Discussed current foodneeds of client  Talked with client about vision challenges of client (client said she has eye appointment on 12/26/2019)  Talked with client about ambulation needs (she said she gets dizzy on occasion)  Talked with client about transport needs of client  Talked with client about social support (client said she has social support from [name], cousin)  Talked with client about frozen meals she receives monthly through Meals on Wheels program  Talked with client about her current issues faced (she said she has challenges in dealing with allergies)  Talked with client about pain issues faced (she spoke of occasional pain in her left knee)  Talked with client about upcoming client medical appointments  Talked with client about her decreased energy  Talked with client about her occasional shortness of breath   Patient Self Care Activities:  Currently UNABLE TO independently drive due to dizziness s/p CVA but she is able to warm up meals and feed herself.    Client said she has a friend, [name], who occasionally helps client with meals   Plan: LCSW to call client in next 4 weeks to talk with client about food needs of client and about local food resources Client to participate in home health physical therapy sessions as scheduled Client to communicate with RN CM [name] to discuss nursing needs of client. Client to attend scheduled client medical  appointments  Initial goal documentation    Materials Provided: No  Follow Up Plan: LCSW to call client in next 4 weeks to talk with client about food needs of client and about local food resources  The patient verbalized understanding of instructions provided today and declined a print copy of patient instruction materials.   [name].Miaa Latterell MSW, LCSW Licensed Clinical Social Worker [facility] Care Management [phone]

## 2019-11-14 NOTE — Chronic Care Management (AMB) (Signed)
Chronic Care Management    Clinical Social Work Follow Up Note  11/14/2019 Name: Pamela Hughes MRN: [id number] DOB: February 13, 1941  Pamela Hughes is a 79 y.o. year old female who is a primary care patient of Dettinger, Fransisca Kaufmann, MD. The CCM team was consulted for assistance with [employer] .   Review of patient status, including review of consultants reports, other relevant assessments, and collaboration with appropriate care team members and the patient's provider was performed as part of comprehensive patient evaluation and provision of chronic care management services.    SDOH (Social Determinants of Health) assessments performed: No;risk for tobacco use; risk for depression; risk for social isolation    Virtual BH Phone Follow Up from 08/02/2017 in [place]  PHQ-9 Total Score 10     GAD 7 : Generalized Anxiety Score 08/02/2017 07/12/2017  Nervous, Anxious, on Edge 0 0  Control/stop worrying 0 0  Worry too much - different things 0 0  Trouble relaxing 0 0  Restless 0 0  Easily annoyed or irritable 0 0  Afraid - awful might happen 0 0  Total GAD 7 Score 0 0    Outpatient Encounter Medications as of 11/14/2019  Medication Sig  . meclizine (ANTIVERT) 25 MG tablet TAKE ONE TABLET 3 TIMES A DAY AS NEEDED.  . metoprolol succinate (TOPROL-XL) 25 MG 24 hr tablet TAKE ONE (1) TABLET EACH DAY  . traZODone (DESYREL) 150 MG tablet TAKE ONE TABLET DAILY AT BEDTIME  . albuterol (VENTOLIN HFA) 108 (90 Base) MCG/ACT inhaler Inhale 2 puffs into the lungs every 6 (six) hours as needed for wheezing or shortness of breath.  Marland Kitchen amLODipine (NORVASC) 2.5 MG tablet Take 1 tablet (2.5 mg total) by mouth daily.  Marland Kitchen buPROPion (WELLBUTRIN SR) 150 MG 12 hr tablet Take 1 tablet (150 mg total) by mouth 2 (two) times daily. (Needs to be seen before next refill) (Patient taking differently: Take 150 mg by mouth daily. )  . ipratropium (ATROVENT) 0.03 % nasal spray Place 1 spray into both  nostrils 2 (two) times daily. as directed  . omeprazole (PRILOSEC) 20 MG capsule TAKE ONE (1) CAPSULE EACH DAY  . omeprazole (PRILOSEC) 20 MG capsule Take 20 mg by mouth daily.  . pantoprazole (PROTONIX) 40 MG tablet TAKE ONE TABLET EACH MORNING BEFORE BREAKFAST  . rosuvastatin (CRESTOR) 10 MG tablet Take 1 tablet (10 mg total) by mouth daily. (Needs to be seen before next refill)  . triamcinolone cream (KENALOG) 0.1 % Apply 1 application topically 2 (two) times daily.   No facility-administered encounter medications on file as of 11/14/2019.    Goals    .  "I need help with getting food delivered to my home." (pt-stated)      Current Barriers:  Marland Kitchen Knowledge Deficits related to resources available to deliver food to her home.in patient with Chronic Diagnoses of HTN, Depression, DOE , and CVA  . Nurse Case Manager Clinical Goal(s): Over the next 7 days, patient will work with LCSW to address needs related to food delivery options.   Interventions:   Discussed current foodneeds of client  Talked with client about vision challenges of client (client said she has eye appointment on 12/26/2019)  Talked with client about ambulation needs (she said she gets dizzy on occasion)  Talked with client about transport needs of client  Talked with client about social support (client said she has social support from [name], cousin)  Talked with client about frozen  meals she receives monthly through [employer]als on Wheels program  Talked with client about her current issues faced (she said she has challenges in dealing with allergies)  Talked with client about pain issues faced (she spoke of occasional pain in her left knee)  Talked with client about upcoming client medical appointments  Talked with client about her decreased energy  Talked with client about her occasional shortness of breath  Patient Self Care Activities:  Currently UNABLE TO independently drive due to dizziness s/p CVA but  she is able to warm up meals and feed herself.    Client said she has a friend, [name], who occasionally helps client with meals   Plan: LCSW to call client in next 4 weeks to talk with client about food needs of client and about local food resources Client to participate in home health physical therapy sessions as scheduled Client to communicate with RN CM [name] to discuss nursing needs of client. Client to attend scheduled client medical appointments  Initial goal documentation   Follow Up Plan: LCSW to call client in next 4 weeks to talk with client about food needs of client and about local food resources  [name].Mabeline Varas MSW, LCSW Licensed Clinical Social Worker [facility] Care Management [phone]

## 2019-11-28 ENCOUNTER — Ambulatory Visit: Payer: Medicare Other | Admitting: *Deleted

## 2019-11-28 DIAGNOSIS — M1990 Unspecified osteoarthritis, unspecified site: Secondary | ICD-10-CM

## 2019-11-28 DIAGNOSIS — I10 Essential (primary) hypertension: Secondary | ICD-10-CM

## 2019-11-28 NOTE — Chronic Care Management (AMB) (Signed)
  Chronic Care Management   Follow-up Note  11/28/2019 Name: Pamela Hughes MRN: [id number] DOB: [birth year]  Referred by: Dettinger, Elige Radon, MD Reason for referral : Chronic Care Management (RN follow up)   An unsuccessful telephone follow-up was attempted today. The patient was referred to the case management team for assistance with care management and care coordination.   Follow Up Plan: A HIPPA compliant phone message was left for the patient providing contact information and requesting a return call.  The care management team will reach out to the patient again over the next 30 days.   [name], BSN, RN-BC Embedded Chronic Care Manager [facility] / [facility] Care Management Direct Dial: [phone]

## 2019-11-29 ENCOUNTER — Other Ambulatory Visit: Payer: Self-pay | Admitting: Family

## 2019-11-29 ENCOUNTER — Other Ambulatory Visit: Payer: Self-pay | Admitting: Family Medicine

## 2019-12-04 ENCOUNTER — Other Ambulatory Visit: Payer: Self-pay | Admitting: Family Medicine

## 2019-12-04 ENCOUNTER — Telehealth: Payer: Self-pay | Admitting: Family Medicine

## 2019-12-07 ENCOUNTER — Ambulatory Visit: Payer: Medicare Other | Admitting: Family Medicine

## 2019-12-11 ENCOUNTER — Ambulatory Visit: Payer: Medicare Other | Admitting: *Deleted

## 2019-12-11 DIAGNOSIS — I10 Essential (primary) hypertension: Secondary | ICD-10-CM

## 2019-12-11 DIAGNOSIS — M1990 Unspecified osteoarthritis, unspecified site: Secondary | ICD-10-CM

## 2019-12-11 NOTE — Chronic Care Management (AMB) (Signed)
  Chronic Care Management   Follow-up Outreach  12/11/2019 Name: Pamela Hughes MRN: [id number] DOB: 12/14/1940  Referred by: Dettinger, Elige Radon, MD Reason for referral : Chronic Care Management (RN follow up)   An unsuccessful telephone follow-up was attempted today. The patient was referred to the case management team for assistance with care management and care coordination.   Follow Up Plan: A HIPPA compliant phone message was left for the patient providing contact information and requesting a return call.  The care management team will reach out to the patient again over the next 30 days.   [name], BSN, RN-BC Embedded Chronic Care Manager [facility] / [facility] Care Management Direct Dial: [phone]

## 2019-12-14 ENCOUNTER — Other Ambulatory Visit: Payer: Self-pay | Admitting: Family Medicine

## 2019-12-14 DIAGNOSIS — Z1231 Encounter for screening mammogram for malignant neoplasm of breast: Secondary | ICD-10-CM

## 2019-12-18 ENCOUNTER — Ambulatory Visit (INDEPENDENT_AMBULATORY_CARE_PROVIDER_SITE_OTHER): Payer: Medicare Other | Admitting: Nurse Practitioner

## 2019-12-18 ENCOUNTER — Encounter: Payer: Self-pay | Admitting: Nurse Practitioner

## 2019-12-18 ENCOUNTER — Other Ambulatory Visit: Payer: Self-pay

## 2019-12-18 VITALS — BP 139/62 | HR 60 | Temp 97.6°F | Ht 65.0 in | Wt 132.0 lb

## 2019-12-18 DIAGNOSIS — R519 Headache, unspecified: Secondary | ICD-10-CM | POA: Diagnosis not present

## 2019-12-18 NOTE — Progress Notes (Signed)
Subjective:    Patient ID: [name], female    DOB: 01-16-1941, 79 y.o.   MRN: [id number]  Chief Complaint: Ear Pain, Facial Pain, and Facial Swelling   HPI Left sided jaw pain that began last Thursday, some right sided soreness, taking Goodies powder for pain relief. Pain is a soreness/throbbing sensation. States pain feels like a tooth ache but pt does not have back teeth. No new associated dizziness. 7/10 pain. Eating/opening mouth increases pain. Goodie powder relieves pain.    Review of Systems  Constitutional: Negative.   HENT: Positive for facial swelling.   Eyes: Negative.   Respiratory: Negative.   Cardiovascular: Negative.   Gastrointestinal: Negative.   Endocrine: Negative.   Genitourinary: Negative.   Musculoskeletal: Negative.   Skin: Negative.   Allergic/Immunologic: Negative.   Neurological: Negative.   Hematological: Negative.   Psychiatric/Behavioral: Negative.        Objective:   Physical Exam Constitutional:      Appearance: Normal appearance.  HENT:     Head: Normocephalic and atraumatic.     Jaw: Tenderness, swelling and pain on movement present.     Comments: Slight swelling noted to left parotid/ mandibular area     Right Ear: Tympanic membrane, ear canal and external ear normal.     Left Ear: Tympanic membrane, ear canal and external ear normal.     Nose: Nose normal.     Mouth/Throat:     Mouth: Mucous membranes are moist.     Dentition: Normal dentition. Does not have dentures. No dental tenderness, gingival swelling, dental abscesses or gum lesions.     Pharynx: Oropharynx is clear.  Eyes:     Pupils: Pupils are equal, round, and reactive to light.  Cardiovascular:     Rate and Rhythm: Normal rate and regular rhythm.  Pulmonary:     Effort: Pulmonary effort is normal.  Musculoskeletal:        General: Normal range of motion.     Cervical back: Normal range of motion.  Skin:    General: Skin is warm and dry.     Capillary Refill:  Capillary refill takes less than 2 seconds.  Neurological:     General: No focal deficit present.     Mental Status: She is alert and oriented to person, place, and time. Mental status is at baseline.  Psychiatric:        Mood and Affect: Mood normal.        Behavior: Behavior normal.        Thought Content: Thought content normal.        Judgment: Judgment normal.    BP 139/62   Pulse 60   Temp 97.6 F (36.4 C) (Temporal)   Ht 5\' 5"  (1.651 m)   Wt 132 lb (59.9 kg)   BMI 21.97 kg/m       Assessment & Plan:  in today with chief complaint of Ear Pain, Facial Pain, and Facial Swelling   1. Lt facial pain tylenol OTC- stop goody powders Ice as needed.  RTO if no change    The above assessment and management plan was discussed with the patient. The patient verbalized understanding of and has agreed to the management plan. Patient is aware to call the clinic if symptoms persist or worsen. Patient is aware when to return to the clinic for a follow-up visit. Patient educated on when it is appropriate to go to the emergency department.   Mary-Margaret [name],  FNP

## 2019-12-18 NOTE — Patient Instructions (Signed)
Preventive Dental Care, Adult Preventive dental care includes seeing a dentist regularly and practicing good dental care (oral hygiene) at home. These actions can help to prevent cavities, root canal problems, gum disease (gingivitis), tooth loss, and other tooth problems. Regular dental exams may also help your health care provider diagnose other medical problems. Many diseases, including mouth cancers, have early signs that can be found during a preventive dental care visit. What can I expect for my preventive dental care visit? Counseling At your visit, your dentist may ask you about:  Your overall health and diet.  Any new symptoms, such as: ? Bleeding gums. ? Mouth, tooth, or jaw pain. ? Dull headache.  Using a mouthguard for sports or because of teeth grinding.  The need or desire to get braces to straighten teeth (orthodontic care). Physical exam Your dentist will do a mouth (oral) exam to check for:  Cavities.  Gum disease or other problems.  Signs of cancer.  Neck swelling or lumps.  Abnormal jaw movement or pain in the jaw joint.  Signs of teeth grinding, such as loose or fractured teeth. Other services You may also have:  Dental X-rays.  Your teeth cleaned. Follow these instructions at home: Oral health      Brush with fluoride toothpaste every morning and night. If possible, brush within 10 minutes after every meal. Ask your dentist for toothpaste recommendations.  Floss at least one time every day.  Check your teeth for white or brown spots after brushing. These may be signs of cavities.  Check your gums for swelling or bleeding. These may be signs of gum disease.  Take over-the-counter and prescription medicines only as told by your dentist.  If you have a permanent tooth knocked out: ? Find the tooth. ? Pick it up by the top (crown) with a tissue or gauze. ? Wash the tooth for no more than 10 seconds under cold, running water. ? Try to put the  tooth back into the gum socket. ? Put the tooth in a glass of milk if you cannot get it back in place. ? Go to your dentist or an emergency department right away. Take the tooth with you. Eating and drinking  Eat a diet that includes plenty of fruits, vegetables, milk and dairy products, whole grains, and proteins. Do not eat a lot of starchy foods or foods with added sugar. Talk with your health care provider if you have questions about following a healthy diet.  Avoid sodas, sugary snacks, and sticky candies.  Choose water or milk instead of fruit juice, sodas, or sports drinks. General instructions  Do not use any products that contain nicotine or tobacco, such as cigarettes, e-cigarettes, and chewing tobacco. If you need help quitting, ask your health care provider.  Do not get mouth piercings.  Always wear a mouthguard when playing contact or collision sports. For more information:  American Dental Association: www.mouthhealthy.org Contact a dentist if you have:  Gum, tooth, or jaw pain.  Red, swollen, or bleeding gums.  A tooth or teeth that are very sensitive to hot or cold.  Very bad breath.  A problem with a filling, crown, implant, or denture.  A broken or loose tooth.  A growth or sore in your mouth that is not going away.  A dry mouth. What's next?  See your dentist one or two times each year for oral exams and cleanings.  If you have an early problem, like a cavity, your dentist will schedule   time for you to get treatment. If you have a tooth root problem, gum disease, or a sign of another disease, your dentist may send you to see another health care provider for care. This information is not intended to replace advice given to you by your health care provider. Make sure you discuss any questions you have with your health care provider. Document Revised: 03/10/2018 Document Reviewed: 01/02/2018 Elsevier Patient Education  2020 [employer]  

## 2019-12-20 ENCOUNTER — Ambulatory Visit (INDEPENDENT_AMBULATORY_CARE_PROVIDER_SITE_OTHER): Payer: Medicare Other | Admitting: Licensed Clinical Social Worker

## 2019-12-20 DIAGNOSIS — I10 Essential (primary) hypertension: Secondary | ICD-10-CM

## 2019-12-20 DIAGNOSIS — I63531 Cerebral infarction due to unspecified occlusion or stenosis of right posterior cerebral artery: Secondary | ICD-10-CM

## 2019-12-20 DIAGNOSIS — R0609 Other forms of dyspnea: Secondary | ICD-10-CM

## 2019-12-20 NOTE — Chronic Care Management (AMB) (Signed)
Chronic Care Management    Clinical Social Work Follow Up Note  12/20/2019 Name: Pamela Hughes MRN: [id number] DOB: [birth year]  Pamela Hughes is a 79 y.o. year old female who is a primary care patient of Dettinger, Elige Radon, MD. The CCM team was consulted for assistance with [name] .   Review of patient status, including review of consultants reports, other relevant assessments, and collaboration with appropriate care team members and the patient's provider was performed as part of comprehensive patient evaluation and provision of chronic care management services.    SDOH (Social Determinants of Health) assessments performed: No;risk for tobacco use; risk for depression;risk for stress    Virtual BH Phone Follow Up from 08/02/2017 in [facility]  PHQ-9 Total Score 10     GAD 7 : Generalized Anxiety Score 08/02/2017 07/12/2017  Nervous, Anxious, on Edge 0 0  Control/stop worrying 0 0  Worry too much - different things 0 0  Trouble relaxing 0 0  Restless 0 0  Easily annoyed or irritable 0 0  Afraid - awful might happen 0 0  Total GAD 7 Score 0 0    Outpatient Encounter Medications as of 12/20/2019  Medication Sig  . albuterol (VENTOLIN HFA) 108 (90 Base) MCG/ACT inhaler USE 2 PUFFS EVERY 6 HOURS AS NEEDED  . amLODipine (NORVASC) 2.5 MG tablet Take 1 tablet (2.5 mg total) by mouth daily.  Marland Kitchen buPROPion (WELLBUTRIN SR) 150 MG 12 hr tablet TAKE ONE TABLET EVERY MORNING  . ipratropium (ATROVENT) 0.03 % nasal spray Place 1 spray into both nostrils 2 (two) times daily. as directed  . meclizine (ANTIVERT) 25 MG tablet TAKE ONE TABLET 3 TIMES A DAY AS NEEDED.  . metoprolol succinate (TOPROL-XL) 25 MG 24 hr tablet TAKE ONE (1) TABLET EACH DAY  . omeprazole (PRILOSEC) 20 MG capsule TAKE ONE (1) CAPSULE EACH DAY  . omeprazole (PRILOSEC) 20 MG capsule Take 20 mg by mouth daily.  . pantoprazole (PROTONIX) 40 MG tablet TAKE ONE TABLET EACH MORNING BEFORE BREAKFAST  .  rosuvastatin (CRESTOR) 10 MG tablet Take 1 tablet (10 mg total) by mouth daily. (Needs to be seen before next refill)  . traZODone (DESYREL) 150 MG tablet TAKE ONE TABLET DAILY AT BEDTIME  . triamcinolone cream (KENALOG) 0.1 % Apply 1 application topically 2 (two) times daily.   No facility-administered encounter medications on file as of 12/20/2019.     Goals    .  "I need help with getting food delivered to my home." (pt-stated)      Current Barriers:  Marland Kitchen Knowledge Deficits related to resources available to deliver food to her home.in patient with Chronic Diagnoses of HTN, Depression, DOE , and CVA  . Nurse Case Manager Clinical Goal(s): Over the next 7 days, patient will work with LCSW to address needs related to food delivery options.   Interventions:   LCSW discussed with client the current foodneeds of client  Talked with client about vision challenges of client (client said she has eye appointment on 01/26/2020)  Talked with client about ambulation needs (she said she gets dizzy on occasion)  Talked with client about transport needs of client  Talked with client about social support (client said she has social support from [name], cousin)  Talked with client about frozen meals she receives monthly through [employer] Wheels program  Talked with client about her current issues faced (she said she has challenges in dealing with allergies)  Talked with client  about pain issues faced (she spoke of occasional pain in her left knee)  Talked with client about upcoming client medical appointments  Talked with client about her decreased energy  Talked with client about her occasional shortness of breath  Client and LCSW spoke of client episodes of dizziness (she said she feels dizzy on a daily basis)  Talked with client about client completion of ADLs  Collaborated with RNCM about nursing needs of client  Patient Self Care Activities:  Currently UNABLE TO independently  drive due to dizziness s/p CVA but she is able to warm up meals and feed herself.    Client said she has a friend, [name], who occasionally helps client with meals   Plan: LCSW to call client in next 4 weeks to talk with client about food needs of client and about local food resources Client to communicate with RN CM [name] to discuss nursing needs of client. Client to attend scheduled client medical appointments  Initial goal documentation     Follow Up Plan: LCSW to call client in next 4 weeks to talk with client about food needs of client and about local food resources  [name].Kamela Blansett MSW, LCSW Licensed Clinical Social Worker [facility] Care Management [phone]

## 2019-12-20 NOTE — Patient Instructions (Addendum)
Licensed Clinical Social Worker Visit Information  Goals we discussed today:      "I need help with getting food delivered to my home." (pt-stated)        Current Barriers:   Knowledge Deficits related to resources available to deliver food to her home.in patient with Chronic Diagnoses of HTN, Depression, DOE , and CVA   Nurse Case Manager Clinical Goal(s): Over the next 7 days, patient will work with LCSW to address needs related to food delivery options.   Interventions:   LCSW discussed with client the current foodneeds of client  Talked with client about vision challenges of client(client said she has eye appointment on 01/26/2020)  Talked with client about ambulation needs (she said she gets dizzy on occasion)  Talked with client about transport needs of client  Talked with client about social support (client said she has social support from [name], cousin)  Talked with client about frozen meals she receives monthly through [employer] Whe[employer] program  Talked with client about her current issues faced (she said she has challenges in dealing with allergies)  Talked with client about pain issues faced (she spoke of occasional pain in her left knee)  Talked with client about upcoming client medical appointments  Talked with client about her decreased energy  Talked with client about her occasional shortness of breath  Client and LCSW spoke of client episodes of dizziness (she said she feels dizzy on a daily basis)  Talked with client about client completion of ADLs  Collaborated with RNCM about nursing needs of client  Patient Self Care Activities:  Currently UNABLE TO independently drive due to dizziness s/p CVA but she is able to warm up meals and feed herself.    Client said she has a friend, [name], who occasionally helps client with meals   Plan: LCSW to call client in next 4 weeks to talk with client about food needs of client and about local food  resources Client to communicate with RN CM [name] to discuss nursing needs of client. Client to attend scheduled client medical appointments  Initial goal documentation     Follow Up Plan: LCSW to call client in next 4 weeks to talk with client about food needs of client and about local food resources  Materials Provided: No  The patient verbalized understanding of instructions provided today and declined a print copy of patient instruction materials.   [name].Calandria Mullings MSW, LCSW Licensed Clinical Social Worker [facility] Care [facility] [phone]

## 2019-12-31 ENCOUNTER — Other Ambulatory Visit: Payer: Self-pay | Admitting: Family Medicine

## 2020-01-16 ENCOUNTER — Ambulatory Visit: Payer: Medicare Other | Admitting: Family Medicine

## 2020-01-23 ENCOUNTER — Telehealth: Payer: Medicare Other

## 2020-01-25 ENCOUNTER — Telehealth: Payer: Medicare Other | Admitting: *Deleted

## 2020-01-25 ENCOUNTER — Telehealth: Payer: Self-pay | Admitting: *Deleted

## 2020-01-25 NOTE — Telephone Encounter (Signed)
  Chronic Care Management   Outreach Note  01/25/2020 Name: Pamela Hughes MRN: [id number] DOB: 1941/02/27  Referred by: Dettinger, Elige Radon, MD Reason for referral : Chronic Care Management (RN follow up)   An unsuccessful telephone follow-up was attempted today. The patient was referred to the case management team for assistance with care management and care coordination.   Follow Up Plan: A HIPAA compliant phone message was left for the patient providing contact information and requesting a return call.  The care management team will reach out to the patient again over the next 10 days.   [name], BSN, RN-BC Embedded Chronic Care Manager [facility] Care Management Direct Dial: [phone]

## 2020-02-08 ENCOUNTER — Other Ambulatory Visit: Payer: Self-pay | Admitting: Family Medicine

## 2020-02-21 NOTE — Telephone Encounter (Signed)
Pt has been r/s  

## 2020-02-25 ENCOUNTER — Ambulatory Visit (INDEPENDENT_AMBULATORY_CARE_PROVIDER_SITE_OTHER): Payer: Medicare Other | Admitting: Family Medicine

## 2020-02-25 ENCOUNTER — Other Ambulatory Visit: Payer: Self-pay

## 2020-02-25 ENCOUNTER — Encounter: Payer: Self-pay | Admitting: Family Medicine

## 2020-02-25 VITALS — BP 164/64 | Temp 98.0°F | Ht 65.0 in | Wt 132.0 lb

## 2020-02-25 DIAGNOSIS — F329 Major depressive disorder, single episode, unspecified: Secondary | ICD-10-CM

## 2020-02-25 DIAGNOSIS — Z23 Encounter for immunization: Secondary | ICD-10-CM

## 2020-02-25 DIAGNOSIS — E785 Hyperlipidemia, unspecified: Secondary | ICD-10-CM | POA: Diagnosis not present

## 2020-02-25 DIAGNOSIS — L2089 Other atopic dermatitis: Secondary | ICD-10-CM

## 2020-02-25 DIAGNOSIS — F32A Depression, unspecified: Secondary | ICD-10-CM

## 2020-02-25 DIAGNOSIS — I10 Essential (primary) hypertension: Secondary | ICD-10-CM

## 2020-02-25 LAB — CMP14+EGFR
ALT: 16 IU/L (ref 0–32)
AST: 17 IU/L (ref 0–40)
Albumin/Globulin Ratio: 1.8 (ref 1.2–2.2)
Albumin: 4.1 g/dL (ref 3.7–4.7)
Alkaline Phosphatase: 98 IU/L (ref 44–121)
BUN/Creatinine Ratio: 23 (ref 12–28)
BUN: 26 mg/dL (ref 8–27)
Bilirubin Total: 0.3 mg/dL (ref 0.0–1.2)
CO2: 26 mmol/L (ref 20–29)
Calcium: 9.5 mg/dL (ref 8.7–10.3)
Chloride: 100 mmol/L (ref 96–106)
Creatinine, Ser: 1.15 mg/dL — ABNORMAL HIGH (ref 0.57–1.00)
GFR calc Af Amer: 52 mL/min/{1.73_m2} — ABNORMAL LOW (ref >59)
GFR calc non Af Amer: 45 mL/min/{1.73_m2} — ABNORMAL LOW (ref >59)
Globulin, Total: 2.3 g/dL (ref 1.5–4.5)
Glucose: 84 mg/dL (ref 65–99)
Potassium: 4.3 mmol/L (ref 3.5–5.2)
Sodium: 140 mmol/L (ref 134–144)
Total Protein: 6.4 g/dL (ref 6.0–8.5)

## 2020-02-25 LAB — CBC WITH DIFFERENTIAL/PLATELET
Basophils Absolute: 0.1 10*3/uL (ref 0.0–0.2)
Basos: 1 %
EOS (ABSOLUTE): 0.4 10*3/uL (ref 0.0–0.4)
Eos: 5 %
Hematocrit: 36.2 % (ref 34.0–46.6)
Hemoglobin: 12.5 g/dL (ref 11.1–15.9)
Immature Grans (Abs): 0 10*3/uL (ref 0.0–0.1)
Immature Granulocytes: 0 %
Lymphocytes Absolute: 1.4 10*3/uL (ref 0.7–3.1)
Lymphs: 20 %
MCH: 32.3 pg (ref 26.6–33.0)
MCHC: 34.5 g/dL (ref 31.5–35.7)
MCV: 94 fL (ref 79–97)
Monocytes Absolute: 0.6 10*3/uL (ref 0.1–0.9)
Monocytes: 9 %
Neutrophils Absolute: 4.5 10*3/uL (ref 1.4–7.0)
Neutrophils: 65 %
Platelets: 329 10*3/uL (ref 150–450)
RBC: 3.87 x10E6/uL (ref 3.77–5.28)
RDW: 12.8 % (ref 11.7–15.4)
WBC: 7 10*3/uL (ref 3.4–10.8)

## 2020-02-25 LAB — LIPID PANEL
Chol/HDL Ratio: 2.1 ratio (ref 0.0–4.4)
Cholesterol, Total: 192 mg/dL (ref 100–199)
HDL: 93 mg/dL (ref >39)
LDL Chol Calc (NIH): 89 mg/dL (ref 0–99)
Triglycerides: 55 mg/dL (ref 0–149)
VLDL Cholesterol Cal: 10 mg/dL (ref 5–40)

## 2020-02-25 MED ORDER — METOPROLOL SUCCINATE ER 25 MG PO TB24
25.0000 mg | ORAL_TABLET | Freq: Every day | ORAL | 3 refills | Status: DC
Start: 2020-02-25 — End: 2021-03-04

## 2020-02-25 MED ORDER — HYDROXYZINE HCL 25 MG PO TABS: 25.0000 mg | ORAL_TABLET | Freq: Three times a day (TID) | ORAL | 3 refills | Status: DC | PRN

## 2020-02-25 MED ORDER — BUPROPION HCL ER (SR) 150 MG PO TB12: 150.0000 mg | ORAL_TABLET | Freq: Every morning | ORAL | 3 refills | Status: DC

## 2020-02-25 MED ORDER — TRAZODONE HCL 150 MG PO TABS
150.0000 mg | ORAL_TABLET | Freq: Every day | ORAL | 3 refills | Status: DC
Start: 2020-02-25 — End: 2020-08-26

## 2020-02-25 MED ORDER — OMEPRAZOLE 20 MG PO CPDR
20.0000 mg | DELAYED_RELEASE_CAPSULE | Freq: Every day | ORAL | 3 refills | Status: DC
Start: 2020-02-25 — End: 2021-03-04

## 2020-02-25 NOTE — Progress Notes (Signed)
BP (!) 164/64   Temp 98 F (36.7 C)   Ht '5\' 5"'  (1.651 m)   Wt 132 lb (59.9 kg)   BMI 21.97 kg/m    Subjective:   Patient ID: Pamela Hughes, female    DOB: 07/09/1940, 79 y.o.   MRN: [id number]  HPI: Pamela Hughes is a 79 y.o. female presenting on 02/25/2020 for Medical Management of Chronic Issues and Hypertension   HPI Hyperlipidemia Patient is coming in for recheck of his hyperlipidemia. The patient is currently taking Crestor. They deny any issues with myalgias or history of liver damage from it. They deny any focal numbness or weakness or chest pain.   Hypertension Patient is currently on amlodipine and metoprolol, and their blood pressure today is 164/64 but she says at home it runs typically 140s and denies anything higher than that typically at home.. Patient denies any lightheadedness or dizziness. Patient denies headaches, blurred vision, chest pains, shortness of breath, or weakness. Denies any side effects from medication and is content with current medication.   Depression Patient is coming in for recheck of depression.  She is currently taking Wellbutrin and trazodone and seem to be helping.  Patient has been having itching and rash of her body, rash only comes after she Itches.  Relevant past medical, surgical, family and social history reviewed and updated as indicated. Interim medical history since our last visit reviewed. Allergies and medications reviewed and updated.  Review of Systems  Constitutional: Negative for chills and fever.  Eyes: Negative for visual disturbance.  Respiratory: Negative for chest tightness and shortness of breath.   Cardiovascular: Negative for chest pain and leg swelling.  Musculoskeletal: Negative for back pain and gait problem.  Skin: Negative for rash.  Neurological: Negative for light-headedness and headaches.  Psychiatric/Behavioral: Negative for agitation and behavioral problems.  All other systems reviewed and are  negative.   Per HPI unless specifically indicated above   Allergies as of 02/25/2020   No Known Allergies     Medication List       Accurate as of February 25, 2020 11:10 AM. If you have any questions, ask your nurse or doctor.        STOP taking these medications   pantoprazole 40 MG tablet Commonly known as: PROTONIX Stopped by: Fransisca Kaufmann Honest Safranek, MD     TAKE these medications   albuterol 108 (90 Base) MCG/ACT inhaler Commonly known as: VENTOLIN HFA USE 2 PUFFS EVERY 6 HOURS AS NEEDED   amLODipine 2.5 MG tablet Commonly known as: NORVASC Take 1 tablet (2.5 mg total) by mouth daily.   buPROPion 150 MG 12 hr tablet Commonly known as: WELLBUTRIN SR Take 1 tablet (150 mg total) by mouth every morning.   hydrOXYzine 25 MG tablet Commonly known as: ATARAX/VISTARIL Take 1 tablet (25 mg total) by mouth 3 (three) times daily as needed. Started by: Fransisca Kaufmann Camora Tremain, MD   ipratropium 0.03 % nasal spray Commonly known as: ATROVENT USE TWICE DAILY AS DIRECTED   meclizine 25 MG tablet Commonly known as: ANTIVERT TAKE ONE TABLET 3 TIMES A DAY AS NEEDED.   metoprolol succinate 25 MG 24 hr tablet Commonly known as: TOPROL-XL Take 1 tablet (25 mg total) by mouth daily. What changed: See the new instructions. Changed by: Fransisca Kaufmann Dierre Crevier, MD   omeprazole 20 MG capsule Commonly known as: PRILOSEC Take 1 capsule (20 mg total) by mouth daily. What changed: See the new instructions. Changed by: Fransisca Kaufmann Shakti Fleer,  MD   rosuvastatin 10 MG tablet Commonly known as: CRESTOR Take 1 tablet (10 mg total) by mouth daily. (Needs to be seen before next refill)   traZODone 150 MG tablet Commonly known as: DESYREL Take 1 tablet (150 mg total) by mouth at bedtime.   triamcinolone cream 0.1 % Commonly known as: KENALOG Apply 1 application topically 2 (two) times daily.        Objective:   BP (!) 164/64   Temp 98 F (36.7 C)   Ht '5\' 5"'  (1.651 m)   Wt 132 lb (59.9  kg)   BMI 21.97 kg/m   Wt Readings from Last 3 Encounters:  02/25/20 132 lb (59.9 kg)  12/18/19 132 lb (59.9 kg)  09/20/19 140 lb (63.5 kg)    Physical Exam Vitals and nursing note reviewed.  Constitutional:      General: She is not in acute distress.    Appearance: She is well-developed. She is not diaphoretic.  Eyes:     Conjunctiva/sclera: Conjunctivae normal.  Cardiovascular:     Rate and Rhythm: Normal rate and regular rhythm.     Heart sounds: Normal heart sounds. No murmur heard.   Pulmonary:     Effort: Pulmonary effort is normal. No respiratory distress.     Breath sounds: Normal breath sounds. No wheezing.  Musculoskeletal:        General: No tenderness. Normal range of motion.  Skin:    General: Skin is warm and dry.     Findings: No rash.  Neurological:     Mental Status: She is alert and oriented to person, place, and time.     Coordination: Coordination normal.  Psychiatric:        Behavior: Behavior normal.       Assessment & Plan:   Problem List Items Addressed This Visit      Cardiovascular and Mediastinum   Hypertension   Relevant Medications   metoprolol succinate (TOPROL-XL) 25 MG 24 hr tablet   Other Relevant Orders   CBC with Differential/Platelet   CMP14+EGFR     Other   Depression   Relevant Medications   buPROPion (WELLBUTRIN SR) 150 MG 12 hr tablet   traZODone (DESYREL) 150 MG tablet   hydrOXYzine (ATARAX/VISTARIL) 25 MG tablet   Dyslipidemia (high LDL; low HDL) - Primary   Relevant Orders   Lipid panel    Other Visit Diagnoses    Flu vaccine need       Relevant Orders   Flu Vaccine QUAD High Dose(Fluad) (Completed)   Need for Tdap vaccination       Relevant Orders   Tdap vaccine greater than or equal to 7yo IM (Completed)   Need for pneumococcal vaccination       Relevant Orders   Pneumococcal polysaccharide vaccine 23-valent greater than or equal to 2yo subcutaneous/IM (Completed)   Other atopic dermatitis        Relevant Medications   hydrOXYzine (ATARAX/VISTARIL) 25 MG tablet      No change in medication, seems to be doing well, added hydroxyzine for the itch and see if that helps.  Continue the triamcinolone.  If rash and itch continues then recommended for her to go see dermatology. Follow up plan: Return in about 6 months (around 08/24/2020), or if symptoms worsen or fail to improve, for Hypertension and cholesterol.  Counseling provided for all of the vaccine components Orders Placed This Encounter  Procedures  . Flu Vaccine QUAD High Dose(Fluad)  . Tdap vaccine greater  than or equal to 7yo IM  . Pneumococcal polysaccharide vaccine 23-valent greater than or equal to 2yo subcutaneous/IM  . CBC with Differential/Platelet  . CMP14+EGFR  . Lipid panel    Caryl Pina, MD [facility] 02/25/2020, 11:10 AM

## 2020-02-28 ENCOUNTER — Ambulatory Visit (INDEPENDENT_AMBULATORY_CARE_PROVIDER_SITE_OTHER): Payer: Medicare Other | Admitting: Licensed Clinical Social Worker

## 2020-02-28 ENCOUNTER — Telehealth: Payer: Self-pay | Admitting: Family Medicine

## 2020-02-28 DIAGNOSIS — I10 Essential (primary) hypertension: Secondary | ICD-10-CM | POA: Diagnosis not present

## 2020-02-28 DIAGNOSIS — R0609 Other forms of dyspnea: Secondary | ICD-10-CM

## 2020-02-28 DIAGNOSIS — F331 Major depressive disorder, recurrent, moderate: Secondary | ICD-10-CM

## 2020-02-28 DIAGNOSIS — Z8673 Personal history of transient ischemic attack (TIA), and cerebral infarction without residual deficits: Secondary | ICD-10-CM

## 2020-02-28 NOTE — Chronic Care Management (AMB) (Signed)
Chronic Care Management    Clinical Social Work Follow Up Note  02/28/2020 Name: Pamela Hughes MRN: [id number] DOB: [birth year]  Pamela Hughes is a 79 y.o. year old female who is a primary care patient of Dettinger, Elige Radon, MD. The CCM team was consulted for assistance with [name] .   Review of patient status, including review of consultants reports, other relevant assessments, and collaboration with appropriate care team members and the patient's provider was performed as part of comprehensive patient evaluation and provision of chronic care management services.    SDOH (Social Determinants of Health) assessments performed: No;risk for depression; risk for tobacco use; risk for stress; risk for physical inactivity    Virtual BH Phone Follow Up from 08/02/2017 in [facility]  PHQ-9 Total Score 10       GAD 7 : Generalized Anxiety Score 08/02/2017 07/12/2017  Nervous, Anxious, on Edge 0 0  Control/stop worrying 0 0  Worry too much - different things 0 0  Trouble relaxing 0 0  Restless 0 0  Easily annoyed or irritable 0 0  Afraid - awful might happen 0 0  Total GAD 7 Score 0 0    Outpatient Encounter Medications as of 02/28/2020  Medication Sig  . albuterol (VENTOLIN HFA) 108 (90 Base) MCG/ACT inhaler USE 2 PUFFS EVERY 6 HOURS AS NEEDED  . amLODipine (NORVASC) 2.5 MG tablet Take 1 tablet (2.5 mg total) by mouth daily.  Marland Kitchen buPROPion (WELLBUTRIN SR) 150 MG 12 hr tablet Take 1 tablet (150 mg total) by mouth every morning.  . hydrOXYzine (ATARAX/VISTARIL) 25 MG tablet Take 1 tablet (25 mg total) by mouth 3 (three) times daily as needed.  Marland Kitchen ipratropium (ATROVENT) 0.03 % nasal spray USE TWICE DAILY AS DIRECTED  . meclizine (ANTIVERT) 25 MG tablet TAKE ONE TABLET 3 TIMES A DAY AS NEEDED.  . metoprolol succinate (TOPROL-XL) 25 MG 24 hr tablet Take 1 tablet (25 mg total) by mouth daily.  Marland Kitchen omeprazole (PRILOSEC) 20 MG capsule Take 1 capsule (20 mg total) by  mouth daily.  . rosuvastatin (CRESTOR) 10 MG tablet Take 1 tablet (10 mg total) by mouth daily. (Needs to be seen before next refill)  . traZODone (DESYREL) 150 MG tablet Take 1 tablet (150 mg total) by mouth at bedtime.  . triamcinolone cream (KENALOG) 0.1 % Apply 1 application topically 2 (two) times daily.   No facility-administered encounter medications on file as of 02/28/2020.    Goals    .  "I need help with getting food delivered to my home." (pt-stated)       Current Barriers:  Marland Kitchen Knowledge Deficits related to resources available to deliver food to her home.in patient with Chronic Diagnoses of HTN, Depression, DOE , and CVA  . Nurse Case Manager Clinical Goal(s): Over the next 7 days, patient will work with LCSW to address needs related to food delivery options.   Interventions:    LCSW discussed with client the current foodneeds of client  Talked with client about ambulation needs (she said she gets dizzy on occasion)  Talked with client about transport needs of client  Talked with client about social support (client said she has social support from [name], cousin)  Talked with client about frozen meals she receives monthly through [employer] program  Talked with client about her current issues faced (she said she has challenges in dealing with allergies)  Talked with client about pain issues faced (she spoke of  occasional pain in her left knee)  Talked with client about upcoming client medical appointments  Talked with client about her decreased energy  Talked with client about client completion of ADLs  Talked with client about sleeping issues of client   Talked with client about  her recent appointment with Dr. Louanne Skye  Talked with client about her upcoming medical appointments (she said she has mammagram scheduled for March 08, 2020)  Talked with client about vision of client  Patient Self Care Activities:  Currently UNABLE TO independently  drive due to dizziness s/p CVA but she is able to warm up meals and feed herself.    Client said she has a friend, [name], who occasionally helps client with meals   Plan: LCSW to call client in next 4 weeks to talk with client about food needs of client and about local food resources Client to participate in home health physical therapy sessions as scheduled Client to communicate with RN CM [name] to discuss nursing needs of client. Client to attend scheduled client medical appointments  Initial goal documentation    Follow Up Plan: LCSW to call client in next 4 weeks to talk with client about food needs of client and about local food resources  [name].Boyd Buffalo MSW, LCSW Licensed Clinical Social Worker [facility] Care [facility] [phone]

## 2020-02-28 NOTE — Patient Instructions (Addendum)
Licensed Clinical Social Worker Visit Information  Goals we discussed today:     "I need help with getting food delivered to my home." (pt-stated)          Current Barriers:   Knowledge Deficits related to resources available to deliver food to her home.in patient with Chronic Diagnoses of HTN, Depression, DOE , and CVA   Nurse Case Manager Clinical Goal(s): Over the next 7 days, patient will work with LCSW to address needs related to food delivery options.   Interventions:    LCSW discussedwith client thecurrent foodneeds of client  Talked with client about ambulation needs (she said she gets dizzy on occasion)  Talked with client about transport needs of client  Talked with client about social support (client said she has social support from [name], cousin)  Talked with client about frozen meals she receives monthly through [employer] program  Talked with client about her current issues faced (she said she has challenges in dealing with allergies)  Talked with client about pain issues faced (she spoke of occasional pain in her left knee)  Talked with client about upcoming client medical appointments  Talked with client about her decreased energy  Talked with client about client completion of ADLs  Talked with client about sleeping issues of client   Talked with client about  her recent appointment with Dr. Louanne Skye  Talked with client about her upcoming medical appointments (she said she has mammagram scheduled for March 08, 2020)  Talked with client about vision of client  Patient Self Care Activities:  Currently UNABLE TO independently drive due to dizziness s/p CVA but she is able to warm up meals and feed herself.    Client said she has a friend, [name], who occasionally helps client with meals   Plan: LCSW to call client in next 4 weeks to talk with client about food needs of client and about local food resources Client to  participate in home health physical therapy sessions as scheduled Client to communicate with RN CM [name] to discuss nursing needs of client. Client to attend scheduled client medical appointments  Initial goal documentation    Follow Up Plan:LCSW to call client in next 4 weeks to talk with client about food needs of client and about local food resources  Materials Provided: No  The patient verbalized understanding of instructions provided today and declined a print copy of patient instruction materials.   [name].Jalexia Lalli MSW, LCSW Licensed Clinical Social Worker [facility] Care Management [phone]

## 2020-02-28 NOTE — Telephone Encounter (Signed)
Pt called and aware of results.

## 2020-03-07 ENCOUNTER — Other Ambulatory Visit: Payer: Self-pay

## 2020-03-07 ENCOUNTER — Other Ambulatory Visit: Payer: Self-pay | Admitting: Family Medicine

## 2020-03-07 ENCOUNTER — Ambulatory Visit (INDEPENDENT_AMBULATORY_CARE_PROVIDER_SITE_OTHER): Payer: Medicare Other | Admitting: Family Medicine

## 2020-03-07 VITALS — BP 134/62 | HR 63 | Temp 98.0°F | Ht 65.0 in | Wt 131.8 lb

## 2020-03-07 DIAGNOSIS — L02811 Cutaneous abscess of head [any part, except face]: Secondary | ICD-10-CM

## 2020-03-07 MED ORDER — CEFTRIAXONE SODIUM 1 G IJ SOLR
1.0000 g | Freq: Once | INTRAMUSCULAR | Status: AC
  Administered 2020-03-07: 1 g via INTRAMUSCULAR

## 2020-03-07 MED ORDER — DOXYCYCLINE HYCLATE 100 MG PO TABS: 100.0000 mg | ORAL_TABLET | Freq: Two times a day (BID) | ORAL | 0 refills | Status: DC

## 2020-03-07 MED ORDER — LORATADINE 10 MG PO TABS: 10.0000 mg | ORAL_TABLET | Freq: Every day | ORAL | 11 refills | Status: DC

## 2020-03-07 NOTE — Patient Instructions (Signed)

## 2020-03-07 NOTE — Progress Notes (Signed)
Subjective: [id number] PCP: Dettinger, Elige Radon, MD OVZ:[id number] P Pamela Hughes is a 79 y.o. female presenting to clinic today for:  1.  Infection Patient reports a left-sided temple soft tissue infection that has been present for 3 to 4 weeks now.  It started off as a pimple but has gradually gotten bigger.  She notes that it has been draining but seems to be getting bigger in size and more tender.  She reports warmth from it.  She has been using some hydroxyzine for itching but this has not been helpful.  She reports left-sided facial itching.   ROS: Per HPI  No Known Allergies Past Medical History:  Diagnosis Date  . Allergy   . Alopecia   . Anxiety   . Arthritis   . Asthma   . Collagenous colitis   . Depression   . Diarrhea    chronic- "states for at least a year)  . Edema   . GERD (gastroesophageal reflux disease)   . HOH (hard of hearing)   . Hyperlipidemia   . Hypertension     Current Outpatient Medications:  .  albuterol (VENTOLIN HFA) 108 (90 Base) MCG/ACT inhaler, USE 2 PUFFS EVERY 6 HOURS AS NEEDED, Disp: 8.5 g, Rfl: 0 .  amLODipine (NORVASC) 2.5 MG tablet, Take 1 tablet (2.5 mg total) by mouth daily., Disp: 90 tablet, Rfl: 3 .  buPROPion (WELLBUTRIN SR) 150 MG 12 hr tablet, Take 1 tablet (150 mg total) by mouth every morning., Disp: 90 tablet, Rfl: 3 .  ipratropium (ATROVENT) 0.03 % nasal spray, USE TWICE DAILY AS DIRECTED, Disp: 30 mL, Rfl: 0 .  meclizine (ANTIVERT) 25 MG tablet, TAKE ONE TABLET 3 TIMES A DAY AS NEEDED., Disp: 30 tablet, Rfl: 1 .  metoprolol succinate (TOPROL-XL) 25 MG 24 hr tablet, Take 1 tablet (25 mg total) by mouth daily., Disp: 90 tablet, Rfl: 3 .  omeprazole (PRILOSEC) 20 MG capsule, Take 1 capsule (20 mg total) by mouth daily., Disp: 90 capsule, Rfl: 3 .  rosuvastatin (CRESTOR) 10 MG tablet, Take 1 tablet (10 mg total) by mouth daily. (Needs to be seen before next refill), Disp: 90 tablet, Rfl: 3 .  traZODone (DESYREL) 150 MG tablet, Take 1  tablet (150 mg total) by mouth at bedtime., Disp: 90 tablet, Rfl: 3 .  hydrOXYzine (ATARAX/VISTARIL) 25 MG tablet, Take 1 tablet (25 mg total) by mouth 3 (three) times daily as needed. (Patient not taking: Reported on 03/07/2020), Disp: 30 tablet, Rfl: 3 .  triamcinolone cream (KENALOG) 0.1 %, Apply 1 application topically 2 (two) times daily., Disp: 453 g, Rfl: 2 Social History   Socioeconomic History  . Marital status: Divorced    Spouse name: Not on file  . Number of children: 1  . Years of education: 55  . Highest education level: 10th grade  Occupational History  . Occupation: Retired    Comment: Designer, fashion/clothing  Tobacco Use  . Smoking status: Former Smoker    Packs/day: 0.25    Years: 5.00    Pack years: 1.25    Types: Cigarettes    Quit date: 10/09/1974    Years since quitting: 45.4  . Smokeless tobacco: Never Used  Vaping Use  . Vaping Use: Never used  Substance and Sexual Activity  . Alcohol use: Never  . Drug use: No  . Sexual activity: Yes    Birth control/protection: Surgical  Other Topics Concern  . Not on file  Social History Narrative   Lives alone  One dog    Social Determinants of Health   Financial Resource Strain:   . Difficulty of Paying Living Expenses: Not on file  Food Insecurity:   . Worried About Programme researcher, broadcasting/film/video in the Last Year: Not on file  . Ran Out of Food in the Last Year: Not on file  Transportation Needs:   . Lack of Transportation (Medical): Not on file  . Lack of Transportation (Non-Medical): Not on file  Physical Activity:   . Days of Exercise per Week: Not on file  . Minutes of Exercise per Session: Not on file  Stress:   . Feeling of Stress : Not on file  Social Connections:   . Frequency of Communication with Friends and Family: Not on file  . Frequency of Social Gatherings with Friends and Family: Not on file  . Attends Religious Services: Not on file  . Active Member of Clubs or Organizations: Not on file  . Attends Tax inspector Meetings: Not on file  . Marital Status: Not on file  Intimate Partner Violence:   . Fear of Current or Ex-Partner: Not on file  . Emotionally Abused: Not on file  . Physically Abused: Not on file  . Sexually Abused: Not on file   Family History  Problem Relation Age of Onset  . Alzheimer's disease Mother   . Heart disease Mother   . Stroke Mother   . Blindness Mother   . Alcohol abuse Father   . Diabetes Brother   . Healthy Daughter     Objective: Office vital signs reviewed. BP 134/62   Pulse 63   Temp 98 F (36.7 C) (Temporal)   Ht 5\' 5"  (1.651 m)   Wt 131 lb 12.8 oz (59.8 kg)   SpO2 98%   BMI 21.93 kg/m   Physical Examination:  General: Awake, alert, nontoxic, No acute distress Skin: Half-dollar sized palpable abscess along the left temple.  She has an appreciable pore at the 12 o'clock position with drainage.  There is soft tissue swelling noted.  Warmth. Neuro: hard of hearing  Assessment/ Plan: 79 y.o. female   1. Cutaneous abscess of head (any part, except face) She has an active draining pore therefore I&D was not performed today.  She was given a dose of Rocephin.  Doxycycline prescribed.  Home care instructions reviewed including warm compresses to encourage drainage.  She understands red flag signs symptoms warranting further evaluation.  Recommend follow-up in 10 days with PCP.  We discussed the remote possibility that she may need referral to general surgery to have this drained and explored.  We will defer to PCPs reassessment in 10 days. - cefTRIAXone (ROCEPHIN) injection 1 g - doxycycline (VIBRA-TABS) 100 MG tablet; Take 1 tablet (100 mg total) by mouth 2 (two) times daily.  Dispense: 20 tablet; Refill: 0  PCP also came to the room to visualize for follow-up.  No orders of the defined types were placed in this encounter.  No orders of the defined types were placed in this encounter.    76, DO [facility] [id number]

## 2020-03-17 ENCOUNTER — Encounter: Payer: Self-pay | Admitting: Family Medicine

## 2020-03-17 ENCOUNTER — Other Ambulatory Visit: Payer: Self-pay

## 2020-03-17 ENCOUNTER — Ambulatory Visit (INDEPENDENT_AMBULATORY_CARE_PROVIDER_SITE_OTHER): Payer: Medicare Other | Admitting: Family Medicine

## 2020-03-17 VITALS — BP 159/61 | HR 60 | Temp 98.3°F | Ht 65.0 in | Wt 131.0 lb

## 2020-03-17 DIAGNOSIS — L02811 Cutaneous abscess of head [any part, except face]: Secondary | ICD-10-CM | POA: Diagnosis not present

## 2020-03-17 NOTE — Progress Notes (Signed)
BP (!) 159/61   Pulse 60   Temp 98.3 F (36.8 C)   Ht 5\' 5"  (1.651 m)   Wt 131 lb (59.4 kg)   SpO2 98%   BMI 21.80 kg/m    Subjective:   Patient ID: , female    DOB: 1940/10/28, 79 y.o.   MRN: [id number]  HPI: Pamela Hughes is a 79 y.o. female presenting on 03/17/2020 for Abscess (left temple)   HPI Patient is coming in for follow-up of left temporal abscess.  She is doing really well.  She says the only issue that she has is still the occasional dizziness.  I discussed going to see a neurologist.  She says it still just middle ear and vertigo and spinning.  She says the meclizine is not working.  She says she does not want to go to a neurologist currently.  Relevant past medical, surgical, family and social history reviewed and updated as indicated. Interim medical history since our last visit reviewed. Allergies and medications reviewed and updated.  Review of Systems  Constitutional: Negative for chills and fever.  Eyes: Negative for visual disturbance.  Respiratory: Negative for chest tightness and shortness of breath.   Cardiovascular: Negative for chest pain and leg swelling.  Musculoskeletal: Negative for back pain and gait problem.  Skin: Negative for color change, rash and wound.  Neurological: Positive for dizziness. Negative for light-headedness and headaches.  Psychiatric/Behavioral: Negative for agitation and behavioral problems.  All other systems reviewed and are negative.   Per HPI unless specifically indicated above   Allergies as of 03/17/2020   No Known Allergies     Medication List       Accurate as of March 17, 2020  1:55 PM. If you have any questions, ask your nurse or doctor.        albuterol 108 (90 Base) MCG/ACT inhaler Commonly known as: VENTOLIN HFA USE 2 PUFFS EVERY 6 HOURS AS NEEDED   amLODipine 2.5 MG tablet Commonly known as: NORVASC Take 1 tablet (2.5 mg total) by mouth daily.   buPROPion 150 MG 12 hr  tablet Commonly known as: WELLBUTRIN SR Take 1 tablet (150 mg total) by mouth every morning.   doxycycline 100 MG tablet Commonly known as: VIBRA-TABS Take 1 tablet (100 mg total) by mouth 2 (two) times daily.   hydrOXYzine 25 MG tablet Commonly known as: ATARAX/VISTARIL Take 1 tablet (25 mg total) by mouth 3 (three) times daily as needed.   ipratropium 0.03 % nasal spray Commonly known as: ATROVENT USE TWICE DAILY AS DIRECTED   loratadine 10 MG tablet Commonly known as: CLARITIN Take 1 tablet (10 mg total) by mouth daily. For itching   meclizine 25 MG tablet Commonly known as: ANTIVERT TAKE ONE TABLET 3 TIMES A DAY AS NEEDED.   metoprolol succinate 25 MG 24 hr tablet Commonly known as: TOPROL-XL Take 1 tablet (25 mg total) by mouth daily.   omeprazole 20 MG capsule Commonly known as: PRILOSEC Take 1 capsule (20 mg total) by mouth daily.   rosuvastatin 10 MG tablet Commonly known as: CRESTOR Take 1 tablet (10 mg total) by mouth daily. (Needs to be seen before next refill)   traZODone 150 MG tablet Commonly known as: DESYREL Take 1 tablet (150 mg total) by mouth at bedtime.   triamcinolone cream 0.1 % Commonly known as: KENALOG Apply 1 application topically 2 (two) times daily.        Objective:   BP (!) 159/61  Pulse 60   Temp 98.3 F (36.8 C)   Ht 5\' 5"  (1.651 m)   Wt 131 lb (59.4 kg)   SpO2 98%   BMI 21.80 kg/m   Wt Readings from Last 3 Encounters:  03/17/20 131 lb (59.4 kg)  03/07/20 131 lb 12.8 oz (59.8 kg)  02/25/20 132 lb (59.9 kg)    Physical Exam Vitals and nursing note reviewed.  Constitutional:      General: She is not in acute distress.    Appearance: She is well-developed. She is not diaphoretic.  Eyes:     Conjunctiva/sclera: Conjunctivae normal.  Cardiovascular:     Rate and Rhythm: Normal rate and regular rhythm.     Heart sounds: Normal heart sounds. No murmur heard.   Pulmonary:     Effort: Pulmonary effort is normal. No  respiratory distress.     Breath sounds: Normal breath sounds. No wheezing.  Musculoskeletal:        General: No tenderness. Normal range of motion.  Skin:    General: Skin is warm and dry.     Findings: No rash.  Neurological:     Mental Status: She is alert and oriented to person, place, and time.     Coordination: Coordination normal.  Psychiatric:        Behavior: Behavior normal.       Assessment & Plan:   Problem List Items Addressed This Visit    None    Visit Diagnoses    Cutaneous abscess of head (any part, except face)    -  Primary      Almost completely healed with a small amount of dry patches skin there, looks great from last time comparatively.  Follow-up as needed  We discussed going to neurology for her dizziness but patient declined and will try to double up on her meclizine first Follow up plan: Return if symptoms worsen or fail to improve.  Counseling provided for all of the vaccine components No orders of the defined types were placed in this encounter.   02/27/20, MD [facility] 03/17/2020, 1:55 PM

## 2020-03-25 ENCOUNTER — Other Ambulatory Visit: Payer: Self-pay | Admitting: Family Medicine

## 2020-03-28 ENCOUNTER — Ambulatory Visit
Admission: RE | Admit: 2020-03-28 | Discharge: 2020-03-28 | Disposition: A | Discharge status: Home / Self Care | Payer: Medicare Other | Source: Ambulatory Visit | Attending: Family Medicine | Admitting: Family Medicine

## 2020-03-28 ENCOUNTER — Other Ambulatory Visit: Payer: Self-pay

## 2020-03-28 DIAGNOSIS — Z1231 Encounter for screening mammogram for malignant neoplasm of breast: Secondary | ICD-10-CM | POA: Diagnosis not present

## 2020-04-01 ENCOUNTER — Ambulatory Visit: Payer: Medicare Other | Admitting: *Deleted

## 2020-04-01 DIAGNOSIS — Z8673 Personal history of transient ischemic attack (TIA), and cerebral infarction without residual deficits: Secondary | ICD-10-CM

## 2020-04-01 DIAGNOSIS — I10 Essential (primary) hypertension: Secondary | ICD-10-CM

## 2020-04-01 DIAGNOSIS — F32A Depression, unspecified: Secondary | ICD-10-CM

## 2020-04-01 NOTE — Patient Instructions (Signed)
Visit Information  Goals Addressed              This Visit's Progress     Patient Stated     COMPLETED: "I need help with getting food delivered to my home." (pt-stated)        Current Barriers:   Knowledge Deficits related to resources available to deliver food to her home.in patient with Chronic Diagnoses of HTN, Depression, DOE , and CVA   Nurse Case Manager Clinical Goal(s): Over the next 7 days, patient will work with LCSW to address needs related to food delivery options.   Interventions:   LCSW provided education to patient re: ADTS and Meals On Wheels (waiting list)  Encouraged client to call ADTS to add her name to waiting list for Meals on Wheels.   Discussed current food arrangements. She is signed up with [employer] in [place] and they deliver a food box. Her daughter and friend also brings things to her as needed.   LCSW spoke with client about client food needs  Patient Self Care Activities:  Currently UNABLE TO independently drive due to dizziness s/p CVA but she is able to warm up meals and feed herself.    Client said she has a friend, [name], who occasionally helps client with meals   Plan: LCSW to call client in next 3 weeks to talk with client about food needs of client and about local food resources Client to participate in home health physical therapy sessions as scheduled Client to communicate with RN CM [name] to discuss nursing needs of client. Client to attend scheduled client medical appointments  Initial goal documentation  Goal Complete: enrolled with Meals on Wheels and has help from another community resource as well       "I need to start exercising and I don't want to fall" (pt-stated)        Current Barriers:   Knowledge Deficits related to physiological changes associated with CVA that may increase dizziness and affect balance    Care management needs related to dizziness in a patient with HTN and a hx of  CVA  transportatoin  Nurse Case Manager Clinical Goal(s):   Over the next 30 days, patient will use assistive devices (walker) to help with balance and reduce fall risk  Over the next 90 days, patient will keep all scheduled medical appointments  Interventions:   Chart reviewed including recent office notes and lab results  Discussed lab results with patient  Previously provided education to patient re: Fall risks in the home and risk reduction techniques.  Reviewed medications with patient   Discussed assistive devices: walker and shower chair  Discussed current physical activity level and need to safely increase physical activity level o Recommended Senior Center programs at [employer] [facility] o [facility] will mail patient information on current Senior Center programs o Discussed walking at walking track close to patient's home  Provided with CCM contact information and encouraged to reach out as needed  Encouraged patient to reach out to PCP with any new or worsening symptoms  Patient Self Care Activities:   Performs ADL's independently  Unable to drive but her cousin assists with transportation  Please see past updates related to this goal by clicking on the "Past Updates" button in the selected goal          Patient verbalizes understanding of instructions provided today.   Follow-up Plan The care management team will reach out to the patient again over the  next 30 days.   [name], BSN, RN-BC Embedded Chronic Care Manager [facility] / [facility] Care Management Direct Dial: [phone]

## 2020-04-01 NOTE — Chronic Care Management (AMB) (Signed)
Chronic Care Management   Follow Up Note   04/01/2020 Name: NOELI LAVERY MRN: [id number] DOB: 1940-11-27  Referred by: Dettinger, Elige Radon, MD Reason for referral : Chronic Care Management (Initial Visit)   LUCILLIE KIESEL is a 79 y.o. year old female who is a primary care patient of Dettinger, Elige Radon, MD. The CCM team was consulted for assistance with chronic disease management and care coordination needs.    Review of patient status, including review of consultants reports, relevant laboratory and other test results, and collaboration with appropriate care team members and the patient's provider was performed as part of comprehensive patient evaluation and provision of chronic care management services.    SDOH (Social Determinants of Health) assessments performed: Yes: transportation See Care Plan activities for detailed interventions related to [facility])     Outpatient Encounter Medications as of 04/01/2020  Medication Sig  . albuterol (VENTOLIN HFA) 108 (90 Base) MCG/ACT inhaler USE 2 PUFFS EVERY 6 HOURS AS NEEDED  . amLODipine (NORVASC) 2.5 MG tablet Take 1 tablet (2.5 mg total) by mouth daily.  Marland Kitchen buPROPion (WELLBUTRIN SR) 150 MG 12 hr tablet Take 1 tablet (150 mg total) by mouth every morning.  Marland Kitchen doxycycline (VIBRA-TABS) 100 MG tablet Take 1 tablet (100 mg total) by mouth 2 (two) times daily.  . hydrOXYzine (ATARAX/VISTARIL) 25 MG tablet Take 1 tablet (25 mg total) by mouth 3 (three) times daily as needed.  Marland Kitchen ipratropium (ATROVENT) 0.03 % nasal spray USE TWICE DAILY AS DIRECTED  . loratadine (CLARITIN) 10 MG tablet Take 1 tablet (10 mg total) by mouth daily. For itching  . meclizine (ANTIVERT) 25 MG tablet TAKE ONE TABLET 3 TIMES A DAY AS NEEDED.  . metoprolol succinate (TOPROL-XL) 25 MG 24 hr tablet Take 1 tablet (25 mg total) by mouth daily.  Marland Kitchen omeprazole (PRILOSEC) 20 MG capsule Take 1 capsule (20 mg total) by mouth daily.  . rosuvastatin (CRESTOR) 10 MG tablet Take 1 tablet (10  mg total) by mouth daily. (Needs to be seen before next refill)  . traZODone (DESYREL) 150 MG tablet Take 1 tablet (150 mg total) by mouth at bedtime.  . triamcinolone cream (KENALOG) 0.1 % Apply 1 application topically 2 (two) times daily.   No facility-administered encounter medications on file as of 04/01/2020.     Goals Addressed              This Visit's Progress     Patient Stated   .  COMPLETED: "I need help with getting food delivered to my home." (pt-stated)        Current Barriers:  Marland Kitchen Knowledge Deficits related to resources available to deliver food to her home.in patient with Chronic Diagnoses of HTN, Depression, DOE , and CVA  . Nurse Case Manager Clinical Goal(s): Over the next 7 days, patient will work with LCSW to address needs related to food delivery options.   Interventions:  . LCSW provided education to patient re: ADTS and Meals On Wheels (waiting list) . Encouraged client to call [employer]TS to add her name to waiting list for Meals on Wheels.  . Discussed current food arrangements. She is signed up with [employer] in [place] and they deliver a food box. Her daughter and friend also brings things to her as needed.  Marland Kitchen LCSW spoke with client about client food needs  Patient Self Care Activities:  Currently UNABLE TO independently drive due to dizziness s/p CVA but she is able to warm up meals and  feed herself.    Client said she has a friend, [name], who occasionally helps client with meals   Plan: LCSW to call client in next 3 weeks to talk with client about food needs of client and about local food resources Client to participate in home health physical therapy sessions as scheduled Client to communicate with RN CM [name] to discuss nursing needs of client. Client to attend scheduled client medical appointments  Initial goal documentation  Goal Complete: enrolled with Meals on Wheels and has help from another community resource as well     .  "I need to start  exercising and I don't want to fall" (pt-stated)        Current Barriers:   Knowledge Deficits related to physiological changes associated with CVA that may increase dizziness and affect balance    Care management needs related to dizziness in a patient with HTN and a hx of CVA  transportatoin  Nurse Case Manager Clinical Goal(s):  Marland Kitchen Over the next 30 days, patient will use assistive devices (walker) to help with balance and reduce fall risk . Over the next 90 days, patient will keep all scheduled medical appointments  Interventions:  . Chart reviewed including recent office notes and lab results . Discussed lab results with patient . Previously provided education to patient re: Fall risks in the home and risk reduction techniques. . Reviewed medications with patient  . Discussed assistive devices: walker and shower chair . Discussed current physical activity level and need to safely increase physical activity level o Recommended Senior Center programs at [employer] [facility] o [facility] will mail patient information on current Senior Center programs o Discussed walking at walking track close to patient's home . Provided with CCM contact information and encouraged to reach out as needed . Encouraged patient to reach out to PCP with any new or worsening symptoms  Patient Self Care Activities:  . Performs ADL's independently . Unable to drive but her cousin assists with transportation  Please see past updates related to this goal by clicking on the "Past Updates" button in the selected goal           Plan:   The care management team will reach out to the patient again over the next 30 days.    [name], BSN, RN-BC Embedded Chronic Care Manager [facility] / [facility] Care Management Direct Dial: [phone]

## 2020-04-03 ENCOUNTER — Ambulatory Visit: Payer: Medicare Other | Admitting: Licensed Clinical Social Worker

## 2020-04-03 DIAGNOSIS — I10 Essential (primary) hypertension: Secondary | ICD-10-CM

## 2020-04-03 DIAGNOSIS — R0609 Other forms of dyspnea: Secondary | ICD-10-CM

## 2020-04-03 DIAGNOSIS — R0600 Dyspnea, unspecified: Secondary | ICD-10-CM

## 2020-04-03 DIAGNOSIS — Z8673 Personal history of transient ischemic attack (TIA), and cerebral infarction without residual deficits: Secondary | ICD-10-CM

## 2020-04-03 NOTE — Chronic Care Management (AMB) (Signed)
Chronic Care Management    Clinical Social Work Follow Up Note  04/03/2020 Name: Pamela Hughes MRN: [id number] DOB: 12-01-1940  Pamela Hughes is a 79 y.o. year old female who is a primary care patient of Dettinger, Elige Radon, MD. The CCM team was consulted for assistance with [name] .   Review of patient status, including review of consultants reports, other relevant assessments, and collaboration with appropriate care team members and the patient's provider was performed as part of comprehensive patient evaluation and provision of chronic care management services.    SDOH (Social Determinants of Health) assessments performed: No; risk for social isolation; risk for tobacco use; risk for depression risk for food insecurity; risk for financial strain    Office Visit from 03/07/2020 in [facility]  PHQ-9 Total Score 0     GAD 7 : Generalized Anxiety Score 03/07/2020 08/02/2017 07/12/2017  Nervous, Anxious, on Edge 0 0 0  Control/stop worrying 0 0 0  Worry too much - different things 0 0 0  Trouble relaxing 0 0 0  Restless 0 0 0  Easily annoyed or irritable 0 0 0  Afraid - awful might happen 0 0 0  Total GAD 7 Score 0 0 0    Outpatient Encounter Medications as of 04/03/2020  Medication Sig  . albuterol (VENTOLIN HFA) 108 (90 Base) MCG/ACT inhaler USE 2 PUFFS EVERY 6 HOURS AS NEEDED  . amLODipine (NORVASC) 2.5 MG tablet Take 1 tablet (2.5 mg total) by mouth daily.  Marland Kitchen buPROPion (WELLBUTRIN SR) 150 MG 12 hr tablet Take 1 tablet (150 mg total) by mouth every morning.  Marland Kitchen doxycycline (VIBRA-TABS) 100 MG tablet Take 1 tablet (100 mg total) by mouth 2 (two) times daily.  . hydrOXYzine (ATARAX/VISTARIL) 25 MG tablet Take 1 tablet (25 mg total) by mouth 3 (three) times daily as needed.  Marland Kitchen ipratropium (ATROVENT) 0.03 % nasal spray USE TWICE DAILY AS DIRECTED  . loratadine (CLARITIN) 10 MG tablet Take 1 tablet (10 mg total) by mouth daily. For itching  . meclizine  (ANTIVERT) 25 MG tablet TAKE ONE TABLET 3 TIMES A DAY AS NEEDED.  . metoprolol succinate (TOPROL-XL) 25 MG 24 hr tablet Take 1 tablet (25 mg total) by mouth daily.  Marland Kitchen omeprazole (PRILOSEC) 20 MG capsule Take 1 capsule (20 mg total) by mouth daily.  . rosuvastatin (CRESTOR) 10 MG tablet Take 1 tablet (10 mg total) by mouth daily. (Needs to be seen before next refill)  . traZODone (DESYREL) 150 MG tablet Take 1 tablet (150 mg total) by mouth at bedtime.  . triamcinolone cream (KENALOG) 0.1 % Apply 1 application topically 2 (two) times daily.   No facility-administered encounter medications on file as of 04/03/2020.    Goals     "I need help with getting food delivered to my home." (pt-stated)          Current Barriers:   Knowledge Deficits related to resources available to deliver food to her home.in patient with Chronic Diagnoses of HTN, Depression, DOE , and CVA   Nurse Case Manager Clinical Goal(s): Over the next 7 days, patient will work with LCSW to address needs related to food delivery options.   Interventions:    LCSW discussedwith client thecurrent foodneeds of client  Talked with client about ambulation needs (she said she gets dizzy on occasion)  Talked with client about transport needs of client  Talked with client about social support (client said she has social support from  [name], cousin)  Talked with client about frozen meals she receives monthly through [employer] program  Talked with client about her current issues faced (she said she has challenges in dealing with allergies)  Talked with client about pain issues faced (she spoke of occasional pain in her left knee)  Talked with client about upcoming client medical appointments  Talked with client about her decreased energy  Talked with client about client completion of ADLs  Talked with client about sleeping issues of client   Talked with client about vision of client  Talked  with client about DME (she has a bath chair she uses in the shower, has a walker)  Talked with client about relaxation techniques (talks with friends on the phone, does word search puzzles)  Collaborated with RNCM regarding nursing needs of client  Patient Self Care Activities:  Currently UNABLE TO independently drive due to dizziness s/p CVA but she is able to warm up meals and feed herself.    Client said she has a friend, [name], who occasionally helps client with meals   Plan: LCSW to call client in next 4 weeks to talk with client about food needs of client and about local food resources Client to communicate with RN CM [name] to discuss nursing needs of client. Client to attend scheduled client medical appointments  Initial goal documentation    Follow Up Plan:LCSW to call client in next 4 weeks to talk with client about food needs of client and about local food resources  [name].Sofie Schendel MSW, LCSW Licensed Clinical Social Worker [facility] Care Management [phone]

## 2020-04-03 NOTE — Patient Instructions (Addendum)
Licensed Clinical Social Worker Visit Information  Goals we discussed today:   "I need help with getting food delivered to my home." (pt-stated)          Current Barriers:   Knowledge Deficits related to resources available to deliver food to her home.in patient with Chronic Diagnoses of HTN, Depression, DOE , and CVA   Nurse Case Manager Clinical Goal(s):Over the next 7 days, patient will work with LCSW to address needs related to food delivery options.   Interventions:   LCSW discussedwith client thecurrent foodneeds of client  Talked with client about ambulation needs (she said she gets dizzy on occasion)  Talked with client about transport needs of client  Talked with client about social support (client said she has social support from [name], cousin)  Talked with client about frozen meals she receives monthly through [employer] program  Talked with client about her current issues faced (she said she has challenges in dealing with allergies)  Talked with client about pain issues faced (she spoke of occasional pain in her left knee)  Talked with client about upcoming client medical appointments  Talked with client about her decreased energy  Talked with client about client completion of ADLs  Talked with client about sleeping issues of client   Talked with client about vision of client  Talked with client about DME (she has a bath chair she uses in the shower, has a walker)  Talked with client about relaxation techniques (talks with friends on the phone, does word search puzzles)  Collaborated with RNCM regarding nursing needs of client  Patient Self Care Activities:  Currently UNABLE TO independently drive due to dizziness s/p CVA but she is able to warm up meals and feed herself.  Client said she has a friend, [name], who occasionally helps client with meals   Plan: LCSW to call client in next4weeks to talk with client about  food needs of client and about local food resources Client to communicate with RN CM [name] to discuss nursing needs of client. Client to attend scheduled client medical appointments  Initial goal documentation    Follow Up Plan:LCSW to call client in next 4 weeks to talk with client about food needs of client and about local food resources  Materials Provided: No  The patient verbalized understanding of instructions provided today and declined a print copy of patient instruction materials.   [name].Leilany Digeronimo MSW, LCSW Licensed Clinical Social Worker [facility] Care Management [phone]

## 2020-05-06 ENCOUNTER — Telehealth: Payer: Medicare Other | Admitting: *Deleted

## 2020-05-08 ENCOUNTER — Telehealth: Payer: Medicare Other

## 2020-05-12 ENCOUNTER — Telehealth: Payer: Self-pay | Admitting: Family Medicine

## 2020-05-12 DIAGNOSIS — R42 Dizziness and giddiness: Secondary | ICD-10-CM

## 2020-05-12 NOTE — Telephone Encounter (Signed)
REFERRAL REQUEST Telephone Note  Have you been seen at our office for this problem? Patient states yes - She states she spoke to someone about this a couple of weeks ago.  (Advise that they may need an appointment with their PCP before a referral can be done)  Reason for Referral: Dizziness Referral discussed with patient:  Best contact number of patient for referral team:    Has patient been seen by a specialist for this issue before:  Patient provider preference for referral: N/A Patient location preference for referral: n/a   Patient notified that referrals can take up to a week or longer to process. If they haven't heard anything within a week they should call back and speak with the referral department.

## 2020-05-15 NOTE — Telephone Encounter (Signed)
Placed referral for patient

## 2020-06-11 ENCOUNTER — Other Ambulatory Visit: Payer: Self-pay | Admitting: Family Medicine

## 2020-06-11 DIAGNOSIS — L2089 Other atopic dermatitis: Secondary | ICD-10-CM

## 2020-06-11 DIAGNOSIS — I10 Essential (primary) hypertension: Secondary | ICD-10-CM

## 2020-06-11 DIAGNOSIS — E785 Hyperlipidemia, unspecified: Secondary | ICD-10-CM

## 2020-06-13 ENCOUNTER — Ambulatory Visit: Payer: Medicare Other | Admitting: Licensed Clinical Social Worker

## 2020-06-13 DIAGNOSIS — R0609 Other forms of dyspnea: Secondary | ICD-10-CM

## 2020-06-13 DIAGNOSIS — I63531 Cerebral infarction due to unspecified occlusion or stenosis of right posterior cerebral artery: Secondary | ICD-10-CM

## 2020-06-13 DIAGNOSIS — I10 Essential (primary) hypertension: Secondary | ICD-10-CM

## 2020-06-13 DIAGNOSIS — M19041 Primary osteoarthritis, right hand: Secondary | ICD-10-CM

## 2020-06-13 DIAGNOSIS — R0600 Dyspnea, unspecified: Secondary | ICD-10-CM

## 2020-06-13 NOTE — Chronic Care Management (AMB) (Signed)
Chronic Care Management    Clinical Social Work Follow Up Note  06/13/2020 Name: Pamela Hughes MRN: [id number] DOB: 27-Mar-1941  Pamela Hughes is a 80 y.o. year old female who is a primary care patient of Dettinger, Elige Radon, MD. The CCM team was consulted for assistance with [name] .   Review of patient status, including review of consultants reports, other relevant assessments, and collaboration with appropriate care team members and the patient's provider was performed as part of comprehensive patient evaluation and provision of chronic care management services.    SDOH (Social Determinants of Health) assessments performed: No; risk for depression; risk for social isolation; risk for tobacco use; risk for food insecurity  Flowsheet Row Office Visit from 03/07/2020 in [facility]  PHQ-9 Total Score 0     GAD 7 : Generalized Anxiety Score 03/07/2020 08/02/2017 07/12/2017  Nervous, Anxious, on Edge 0 0 0  Control/stop worrying 0 0 0  Worry too much - different things 0 0 0  Trouble relaxing 0 0 0  Restless 0 0 0  Easily annoyed or irritable 0 0 0  Afraid - awful might happen 0 0 0  Total GAD 7 Score 0 0 0    Outpatient Encounter Medications as of 06/13/2020  Medication Sig  . albuterol (VENTOLIN HFA) 108 (90 Base) MCG/ACT inhaler USE 2 PUFFS EVERY 6 HOURS AS NEEDED  . amLODipine (NORVASC) 2.5 MG tablet TAKE ONE (1) TABLET EACH DAY  . buPROPion (WELLBUTRIN SR) 150 MG 12 hr tablet Take 1 tablet (150 mg total) by mouth every morning.  Marland Kitchen doxycycline (VIBRA-TABS) 100 MG tablet Take 1 tablet (100 mg total) by mouth 2 (two) times daily.  . hydrOXYzine (ATARAX/VISTARIL) 25 MG tablet TAKE ONE TABLET 3 TIMES A DAY AS NEEDED.  Marland Kitchen ipratropium (ATROVENT) 0.03 % nasal spray USE TWICE DAILY AS DIRECTED  . loratadine (CLARITIN) 10 MG tablet Take 1 tablet (10 mg total) by mouth daily. For itching  . meclizine (ANTIVERT) 25 MG tablet TAKE ONE TABLET 3 TIMES A DAY AS  NEEDED.  . metoprolol succinate (TOPROL-XL) 25 MG 24 hr tablet Take 1 tablet (25 mg total) by mouth daily.  Marland Kitchen omeprazole (PRILOSEC) 20 MG capsule Take 1 capsule (20 mg total) by mouth daily.  . rosuvastatin (CRESTOR) 10 MG tablet TAKE ONE (1) TABLET EACH DAY  . traZODone (DESYREL) 150 MG tablet Take 1 tablet (150 mg total) by mouth at bedtime.  . triamcinolone cream (KENALOG) 0.1 % Apply 1 application topically 2 (two) times daily.   No facility-administered encounter medications on file as of 06/13/2020.    Goals    .  "I need to start exercising and I don't want to fall" (pt-stated)      Current Barriers:   Knowledge Deficits related to physiological changes associated with CVA that may increase dizziness and affect balance    Care management needs related to dizziness in a patient with HTN and a hx of CVA  transportatoin  Nurse Case Manager Clinical Goal(s):  Marland Kitchen Over the next 30 days, patient will use assistive devices (walker) to help with balance and reduce fall risk . Over the next 90 days, patient will keep all scheduled medical appointments  Interventions:   LCSW discussedwith client thecurrent foodneeds of client  Talked with client about ambulation needs (she said she gets dizzy on occasion)  Talked with client about transport needs of client  Talked with client about social support (client said she has  social support from [name], cousin)  Talked with client about frozen meals she receives monthly through [employer] program  Talked with client about pain issues faced   Talked with client about upcoming client medical appointments  Talked with client about her decreased energy  Talked with client about client completion of ADLs  Talked with client about sleeping issues of client   Talked with client about vision of client  Talked with client about DME (she has a bath chair she uses in the shower, has a walker)  Talked with client about  relaxation techniques (talks with friends on the phone, does word search puzzles)   Patient Self Care Activities:  . Performs ADL's independently . Unable to drive but her cousin assists with transportation  Please see past updates related to this goal by clicking on the "Past Updates" button in the selected goal      Follow Up Plan:LCSW to call client in next 4 weeks to talk with client about food needs of client and about local food resources  [name].Alvia Jablonski MSW, LCSW Licensed Clinical Social Worker [facility] [phone]

## 2020-06-13 NOTE — Patient Instructions (Addendum)
Licensed Clinical Social Worker Visit Information  Goals we discussed today:   Current Barriers:   Knowledge Deficits related to physiological changes associated with CVA that may increase dizziness and affect balance    Care management needs related to dizziness in a patient with HTN and a hx of CVA  transportatoin  Nurse Case Manager Clinical Goal(s):   Over the next 30 days, patient will use assistive devices (walker) to help with balance and reduce fall risk  Over the next 90 days, patient will keep all scheduled medical appointments  Interventions:   LCSW discussedwith client thecurrent foodneeds of client  Talked with client about ambulation needs (she said she gets dizzy on occasion)  Talked with client about transport needs of client  Talked with client about social support (client said she has social support from [name], cousin)  Talked with client about frozen meals she receives monthly through [employer] program  Talked with client about pain issues faced   Talked with client about upcoming client medical appointments  Talked with client about her decreased energy  Talked with client about client completion of ADLs  Talked with client about sleeping issues of client   Talked with client about vision of client  Talked with client about DME (she has a bath chair she uses in the shower, has a walker)  Talked with client about relaxation techniques (talks with friends on the phone, does word search puzzles)   Patient Self Care Activities:   Performs ADL's independently  Unable to drive but her cousin assists with transportation  Please see past updates related to this goal by clicking on the "Past Updates" button in the selected goal      Follow Up Plan:LCSW to call client in next 4 weeks to talk with client about food needs of client and about local food resources  Materials Provided: No  The patient verbalized  understanding of instructions provided today and declined a print copy of patient instruction materials.   [name].Emmi Wertheim MSW, LCSW Licensed Clinical Social Worker [facility] Care [facility] [phone]

## 2020-07-17 ENCOUNTER — Ambulatory Visit (INDEPENDENT_AMBULATORY_CARE_PROVIDER_SITE_OTHER): Payer: Medicare Other | Admitting: Licensed Clinical Social Worker

## 2020-07-17 DIAGNOSIS — I63531 Cerebral infarction due to unspecified occlusion or stenosis of right posterior cerebral artery: Secondary | ICD-10-CM | POA: Diagnosis not present

## 2020-07-17 DIAGNOSIS — F32A Depression, unspecified: Secondary | ICD-10-CM

## 2020-07-17 DIAGNOSIS — R0609 Other forms of dyspnea: Secondary | ICD-10-CM

## 2020-07-17 DIAGNOSIS — R0600 Dyspnea, unspecified: Secondary | ICD-10-CM

## 2020-07-17 DIAGNOSIS — I10 Essential (primary) hypertension: Secondary | ICD-10-CM

## 2020-07-17 NOTE — Patient Instructions (Addendum)
Licensed Clinical Social Worker Visit Information  Goals we discussed today:    "I need help with getting food delivered to my home." (pt-stated)          Current Barriers:   Knowledge Deficits related to resources available to deliver food to her home.in patient with Chronic Diagnoses of HTN, Depression, DOE , and CVA   Nurse Case Manager Clinical Goal(s):Over the next 7 days, patient will work with LCSW to address needs related to food delivery options.   Interventions:   LCSW discussedwith client thecurrent foodneeds of client  Talked with client about ambulation needs (she said she gets dizzy on occasion)  Talked with client about transport needs of client  Talked with client about social support (client said she has social support from [name], cousin)  Talked with client about frozen meals she receives monthly through [employer] program  Talked with client about her current issues faced (she said she has challenges in dealing with allergies)   Talked with client about upcoming client medical appointments  Talked with client about her decreased energy  Talked with client about client completion of ADLs  Talked with client about sleeping issues of client   Talked with client about vision of client  Talked with client about DME (she has a bath chair she uses in the shower, has a walker)  Talked with client about relaxation techniques (talks with friends on the phone, does word search puzzles)  Client and LCSW spoke of her appointment with neurologist on July 30, 2020   Patient Self Care Activities:  Currently UNABLE to independently drive due to dizziness s/p CVA but she is able to warm up meals and feed herself.  Client said she has a friend, [name], who occasionally helps client with meals   Plan: LCSW to call client in next4weeks to talk with client about food needs of client and about local food resources Client to  communicate with RN CM [name] to discuss nursing needs of client. Client to attend scheduled client medical appointments  Initial goal documentation    Follow Up Plan:LCSW to call client in next 4 weeks to talk with client about food needs of client and about local food resources  Materials Provided: No  The patient verbalized understanding of instructions provided today and declined a print copy of patient instruction materials.   [name].Tanda Morrissey MSW, LCSW Licensed Clinical Social Worker [facility] Care Management [phone]

## 2020-07-17 NOTE — Chronic Care Management (AMB) (Signed)
Chronic Care Management    Clinical Social Work Follow Up Note  07/17/2020 Name: Pamela Hughes MRN: [id number] DOB: 1940/09/21  Pamela Hughes is a 80 y.o. year old female who is a primary care patient of Dettinger, Elige Radon, MD. The CCM team was consulted for assistance with [name] .   Review of patient status, including review of consultants reports, other relevant assessments, and collaboration with appropriate care team members and the patient's provider was performed as part of comprehensive patient evaluation and provision of chronic care management services.    SDOH (Social Determinants of Health) assessments performed: No; risk for depression; risk for social isolation; risk for tobacco use; risk for financial strain; risk for food insecurity  Flowsheet Row Office Visit from 03/07/2020 in [facility]  PHQ-9 Total Score 0      GAD 7 : Generalized Anxiety Score 03/07/2020 08/02/2017 07/12/2017  Nervous, Anxious, on Edge 0 0 0  Control/stop worrying 0 0 0  Worry too much - different things 0 0 0  Trouble relaxing 0 0 0  Restless 0 0 0  Easily annoyed or irritable 0 0 0  Afraid - awful might happen 0 0 0  Total GAD 7 Score 0 0 0    Outpatient Encounter Medications as of 07/17/2020  Medication Sig  . albuterol (VENTOLIN HFA) 108 (90 Base) MCG/ACT inhaler USE 2 PUFFS EVERY 6 HOURS AS NEEDED  . amLODipine (NORVASC) 2.5 MG tablet TAKE ONE (1) TABLET EACH DAY  . buPROPion (WELLBUTRIN SR) 150 MG 12 hr tablet Take 1 tablet (150 mg total) by mouth every morning.  Marland Kitchen doxycycline (VIBRA-TABS) 100 MG tablet Take 1 tablet (100 mg total) by mouth 2 (two) times daily.  . hydrOXYzine (ATARAX/VISTARIL) 25 MG tablet TAKE ONE TABLET 3 TIMES A DAY AS NEEDED.  Marland Kitchen ipratropium (ATROVENT) 0.03 % nasal spray USE TWICE DAILY AS DIRECTED  . loratadine (CLARITIN) 10 MG tablet Take 1 tablet (10 mg total) by mouth daily. For itching  . meclizine (ANTIVERT) 25 MG tablet TAKE ONE  TABLET 3 TIMES A DAY AS NEEDED.  . metoprolol succinate (TOPROL-XL) 25 MG 24 hr tablet Take 1 tablet (25 mg total) by mouth daily.  Marland Kitchen omeprazole (PRILOSEC) 20 MG capsule Take 1 capsule (20 mg total) by mouth daily.  . rosuvastatin (CRESTOR) 10 MG tablet TAKE ONE (1) TABLET EACH DAY  . traZODone (DESYREL) 150 MG tablet Take 1 tablet (150 mg total) by mouth at bedtime.  . triamcinolone cream (KENALOG) 0.1 % Apply 1 application topically 2 (two) times daily.   No facility-administered encounter medications on file as of 07/17/2020.    Goals     "I need help with getting food delivered to my home." (pt-stated)          Current Barriers:   Knowledge Deficits related to resources available to deliver food to her home.in patient with Chronic Diagnoses of HTN, Depression, DOE , and CVA   Nurse Case Manager Clinical Goal(s):Over the next 7 days, patient will work with LCSW to address needs related to food delivery options.   Interventions:   LCSW discussedwith client thecurrent foodneeds of client  Talked with client about ambulation needs (she said she gets dizzy on occasion)  Talked with client about transport needs of client  Talked with client about social support (client said she has social support from [name], cousin)  Talked with client about frozen meals she receives monthly through [employer] program  Talked with client about her current issues faced (she said she has challenges in dealing with allergies)   Talked with client about upcoming client medical appointments  Talked with client about her decreased energy  Talked with client about client completion of ADLs  Talked with client about sleeping issues of client   Talked with client about vision of client  Talked with client about DME (she has a bath chair she uses in the shower, has a walker)  Talked with client about relaxation techniques (talks with friends on the phone, does word  search puzzles)  Client and LCSW spoke of her appointment with neurologist on July 30, 2020   Patient Self Care Activities:  Currently UNABLE to independently drive due to dizziness s/p CVA but she is able to warm up meals and feed herself.  Client said she has a friend, [name], who occasionally helps client with meals   Plan: LCSW to call client in next4weeks to talk with client about food needs of client and about local food resources Client to communicate with RN CM [name] to discuss nursing needs of client. Client to attend scheduled client medical appointments  Initial goal documentation    Follow Up Plan:LCSW to call client in next 4 weeks to talk with client about food needs of client and about local food resources  [name].Asti Mackley MSW, LCSW Licensed Clinical Social Worker [facility] [phone]

## 2020-07-30 ENCOUNTER — Ambulatory Visit: Payer: Medicare Other | Admitting: Neurology

## 2020-08-14 ENCOUNTER — Ambulatory Visit: Payer: Medicare Other | Admitting: Cardiology

## 2020-08-18 ENCOUNTER — Telehealth: Payer: Medicare Other

## 2020-08-18 ENCOUNTER — Other Ambulatory Visit: Payer: Self-pay | Admitting: Family Medicine

## 2020-08-18 DIAGNOSIS — E785 Hyperlipidemia, unspecified: Secondary | ICD-10-CM

## 2020-08-18 DIAGNOSIS — I10 Essential (primary) hypertension: Secondary | ICD-10-CM

## 2020-08-22 ENCOUNTER — Telehealth: Payer: Medicare Other

## 2020-08-26 ENCOUNTER — Encounter: Payer: Self-pay | Admitting: Family Medicine

## 2020-08-26 ENCOUNTER — Ambulatory Visit (INDEPENDENT_AMBULATORY_CARE_PROVIDER_SITE_OTHER): Payer: Medicare Other | Admitting: Family Medicine

## 2020-08-26 ENCOUNTER — Other Ambulatory Visit: Payer: Self-pay

## 2020-08-26 VITALS — BP 155/62 | HR 58 | Temp 97.4°F | Ht 65.0 in | Wt 138.6 lb

## 2020-08-26 DIAGNOSIS — I10 Essential (primary) hypertension: Secondary | ICD-10-CM | POA: Diagnosis not present

## 2020-08-26 DIAGNOSIS — F3341 Major depressive disorder, recurrent, in partial remission: Secondary | ICD-10-CM | POA: Diagnosis not present

## 2020-08-26 DIAGNOSIS — R918 Other nonspecific abnormal finding of lung field: Secondary | ICD-10-CM | POA: Diagnosis not present

## 2020-08-26 DIAGNOSIS — E785 Hyperlipidemia, unspecified: Secondary | ICD-10-CM

## 2020-08-26 MED ORDER — AMLODIPINE BESYLATE 2.5 MG PO TABS
2.5000 mg | ORAL_TABLET | Freq: Every day | ORAL | 3 refills | Status: DC
Start: 2020-08-26 — End: 2021-09-02

## 2020-08-26 MED ORDER — DOXEPIN HCL 10 MG PO CAPS: 10.0000 mg | ORAL_CAPSULE | Freq: Every evening | ORAL | 1 refills | Status: DC | PRN

## 2020-08-26 MED ORDER — ROSUVASTATIN CALCIUM 10 MG PO TABS: 10.0000 mg | ORAL_TABLET | Freq: Every day | ORAL | 3 refills | Status: DC

## 2020-08-26 NOTE — Addendum Note (Signed)
Addended by: Arville Care on: 08/26/2020 02:35 PM   Modules accepted: Orders

## 2020-08-26 NOTE — Progress Notes (Signed)
BP (!) 155/62   Pulse (!) 58   Temp (!) 97.4 F (36.3 C) (Temporal)   Ht 5\' 5"  (1.651 m)   Wt 138 lb 9.6 oz (62.9 kg)   BMI 23.06 kg/m    Subjective:   Patient ID: , female    DOB: 1941/05/07, 80 y.o.   MRN: [id number]  HPI: Pamela Hughes is a 80 y.o. female presenting on 08/26/2020 for Medical Management of Chronic Issues   HPI Hypertension Patient is currently on metoprolol and amlodipine, and their blood pressure today is 155/62. Patient denies any lightheadedness or dizziness. Patient denies headaches, blurred vision, chest pains, shortness of breath, or weakness. Denies any side effects from medication and is content with current medication.   Hyperlipidemia Patient is coming in for recheck of his hyperlipidemia. The patient is currently taking Crestor. They deny any issues with myalgias or history of liver damage from it. They deny any focal numbness or weakness or chest pain.   Depression anxiety and insomnia Patient is coming in for recheck for depression and anxiety and insomnia.  She is currently taking Wellbutrin and then also takes trazodone to help with sleep and had been working well but now she is not sleeping as well and the trazodone is just not working as much.  She denies any suicidal ideations or thoughts of hurting her self.  Relevant past medical, surgical, family and social history reviewed and updated as indicated. Interim medical history since our last visit reviewed. Allergies and medications reviewed and updated.  Review of Systems  Constitutional: Negative for chills and fever.  Eyes: Negative for visual disturbance.  Respiratory: Negative for chest tightness and shortness of breath.   Cardiovascular: Negative for chest pain and leg swelling.  Skin: Negative for rash.  Neurological: Positive for dizziness (Patient still gets chronic dizziness, has appointment with neurology). Negative for light-headedness and headaches.   Psychiatric/Behavioral: Positive for sleep disturbance. Negative for agitation, behavioral problems and dysphoric mood. The patient is not nervous/anxious.   All other systems reviewed and are negative.   Per HPI unless specifically indicated above   Allergies as of 08/26/2020   No Known Allergies     Medication List       Accurate as of August 26, 2020  2:31 PM. If you have any questions, ask your nurse or doctor.        STOP taking these medications   doxycycline 100 MG tablet Commonly known as: VIBRA-TABS Stopped by: August 28, 2020 Odis Wickey, MD   traZODone 150 MG tablet Commonly known as: DESYREL Stopped by: Elige Radon Augustin Bun, MD     TAKE these medications   albuterol 108 (90 Base) MCG/ACT inhaler Commonly known as: VENTOLIN HFA USE 2 PUFFS EVERY 6 HOURS AS NEEDED   amLODipine 2.5 MG tablet Commonly known as: NORVASC Take 1 tablet (2.5 mg total) by mouth daily. What changed: See the new instructions. Changed by: Elige Radon Dandra Velardi, MD   buPROPion 150 MG 12 hr tablet Commonly known as: WELLBUTRIN SR Take 1 tablet (150 mg total) by mouth every morning.   doxepin 10 MG capsule Commonly known as: SINEQUAN Take 1 capsule (10 mg total) by mouth at bedtime as needed. Started by: Elige Radon, MD   hydrOXYzine 25 MG tablet Commonly known as: ATARAX/VISTARIL TAKE ONE TABLET 3 TIMES A DAY AS NEEDED.   ipratropium 0.03 % nasal spray Commonly known as: ATROVENT USE TWICE DAILY AS DIRECTED   loratadine 10 MG tablet Commonly  known as: CLARITIN Take 1 tablet (10 mg total) by mouth daily. For itching   meclizine 25 MG tablet Commonly known as: ANTIVERT TAKE ONE TABLET 3 TIMES A DAY AS NEEDED.   metoprolol succinate 25 MG 24 hr tablet Commonly known as: TOPROL-XL Take 1 tablet (25 mg total) by mouth daily.   omeprazole 20 MG capsule Commonly known as: PRILOSEC Take 1 capsule (20 mg total) by mouth daily.   rosuvastatin 10 MG tablet Commonly known as:  CRESTOR Take 1 tablet (10 mg total) by mouth daily. What changed: See the new instructions. Changed by: Elige Radon Deontre Allsup, MD   triamcinolone 0.1 % Commonly known as: KENALOG Apply 1 application topically 2 (two) times daily.        Objective:   BP (!) 155/62   Pulse (!) 58   Temp (!) 97.4 F (36.3 C) (Temporal)   Ht 5\' 5"  (1.651 m)   Wt 138 lb 9.6 oz (62.9 kg)   BMI 23.06 kg/m   Wt Readings from Last 3 Encounters:  08/26/20 138 lb 9.6 oz (62.9 kg)  03/17/20 131 lb (59.4 kg)  03/07/20 131 lb 12.8 oz (59.8 kg)    Physical Exam Vitals and nursing note reviewed.  Constitutional:      General: She is not in acute distress.    Appearance: She is well-developed. She is not diaphoretic.  Eyes:     Conjunctiva/sclera: Conjunctivae normal.  Cardiovascular:     Rate and Rhythm: Normal rate and regular rhythm.     Heart sounds: Normal heart sounds. No murmur heard.   Pulmonary:     Effort: Pulmonary effort is normal. No respiratory distress.     Breath sounds: Normal breath sounds. No wheezing.  Musculoskeletal:        General: No tenderness. Normal range of motion.  Skin:    General: Skin is warm and dry.     Findings: No rash.  Neurological:     Mental Status: She is alert and oriented to person, place, and time.     Coordination: Coordination normal.  Psychiatric:        Mood and Affect: Mood is not anxious or depressed.        Behavior: Behavior normal.        Thought Content: Thought content does not include suicidal ideation. Thought content does not include suicidal plan.        Cognition and Memory: Cognition is not impaired.       Assessment & Plan:   Problem List Items Addressed This Visit      Cardiovascular and Mediastinum   Hypertension   Relevant Medications   amLODipine (NORVASC) 2.5 MG tablet   rosuvastatin (CRESTOR) 10 MG tablet     Other   Depression   Relevant Medications   doxepin (SINEQUAN) 10 MG capsule   Dyslipidemia (high LDL;  low HDL)   Relevant Medications   rosuvastatin (CRESTOR) 10 MG tablet    Other Visit Diagnoses    Pulmonary nodules    -  Primary   Essential hypertension       Relevant Medications   amLODipine (NORVASC) 2.5 MG tablet   rosuvastatin (CRESTOR) 10 MG tablet      Patient is a stressors at work anymore, will try doxepin and a low-dose and see if that helps.  Stop trazodone.  Continue current blood pressure medication, slightly elevated today but allowing permissive hypertension, repeat 143/58.   Follow up plan: Return in about 3 months (around  11/26/2020), or if symptoms worsen or fail to improve, for Depression hypertension.  Counseling provided for all of the vaccine components No orders of the defined types were placed in this encounter.   Arville Care, MD [facility] 08/26/2020, 2:31 PM

## 2020-08-27 LAB — CBC WITH DIFFERENTIAL/PLATELET
Basophils Absolute: 0 10*3/uL (ref 0.0–0.2)
Basos: 1 %
EOS (ABSOLUTE): 0.2 10*3/uL (ref 0.0–0.4)
Eos: 2 %
Hematocrit: 35.2 % (ref 34.0–46.6)
Hemoglobin: 12.3 g/dL (ref 11.1–15.9)
Immature Grans (Abs): 0 10*3/uL (ref 0.0–0.1)
Immature Granulocytes: 0 %
Lymphocytes Absolute: 2 10*3/uL (ref 0.7–3.1)
Lymphs: 29 %
MCH: 33.7 pg — ABNORMAL HIGH (ref 26.6–33.0)
MCHC: 34.9 g/dL (ref 31.5–35.7)
MCV: 96 fL (ref 79–97)
Monocytes Absolute: 0.7 10*3/uL (ref 0.1–0.9)
Monocytes: 10 %
Neutrophils Absolute: 3.9 10*3/uL (ref 1.4–7.0)
Neutrophils: 58 %
Platelets: 299 10*3/uL (ref 150–450)
RBC: 3.65 x10E6/uL — ABNORMAL LOW (ref 3.77–5.28)
RDW: 11.8 % (ref 11.7–15.4)
WBC: 6.8 10*3/uL (ref 3.4–10.8)

## 2020-08-27 LAB — LIPID PANEL
Chol/HDL Ratio: 1.8 ratio (ref 0.0–4.4)
Cholesterol, Total: 183 mg/dL (ref 100–199)
HDL: 101 mg/dL (ref >39)
LDL Chol Calc (NIH): 71 mg/dL (ref 0–99)
Triglycerides: 56 mg/dL (ref 0–149)
VLDL Cholesterol Cal: 11 mg/dL (ref 5–40)

## 2020-08-27 LAB — CMP14+EGFR
ALT: 16 IU/L (ref 0–32)
AST: 18 IU/L (ref 0–40)
Albumin/Globulin Ratio: 1.7 (ref 1.2–2.2)
Albumin: 4.2 g/dL (ref 3.7–4.7)
Alkaline Phosphatase: 105 IU/L (ref 44–121)
BUN/Creatinine Ratio: 17 (ref 12–28)
BUN: 21 mg/dL (ref 8–27)
Bilirubin Total: <0.2 mg/dL (ref 0.0–1.2)
CO2: 24 mmol/L (ref 20–29)
Calcium: 8.8 mg/dL (ref 8.7–10.3)
Chloride: 99 mmol/L (ref 96–106)
Creatinine, Ser: 1.21 mg/dL — ABNORMAL HIGH (ref 0.57–1.00)
Globulin, Total: 2.5 g/dL (ref 1.5–4.5)
Glucose: 104 mg/dL — ABNORMAL HIGH (ref 65–99)
Potassium: 4.4 mmol/L (ref 3.5–5.2)
Sodium: 138 mmol/L (ref 134–144)
Total Protein: 6.7 g/dL (ref 6.0–8.5)
eGFR: 45 mL/min/{1.73_m2} — ABNORMAL LOW (ref >59)

## 2020-09-03 IMAGING — CR RIGHT WRIST - COMPLETE 3+ VIEW
1 series · 2 of 2 positions shown · non-contrast
Comparison: None.

CLINICAL DATA: Fall 2 weeks ago, diffuse wrist pain

EXAM:
RIGHT WRIST - COMPLETE 3+ VIEW; LEFT WRIST - COMPLETE 3+ VIEW

[Series 4: pa · 0.17mm/px · 2 of 2 slices shown]
[im 1/2]
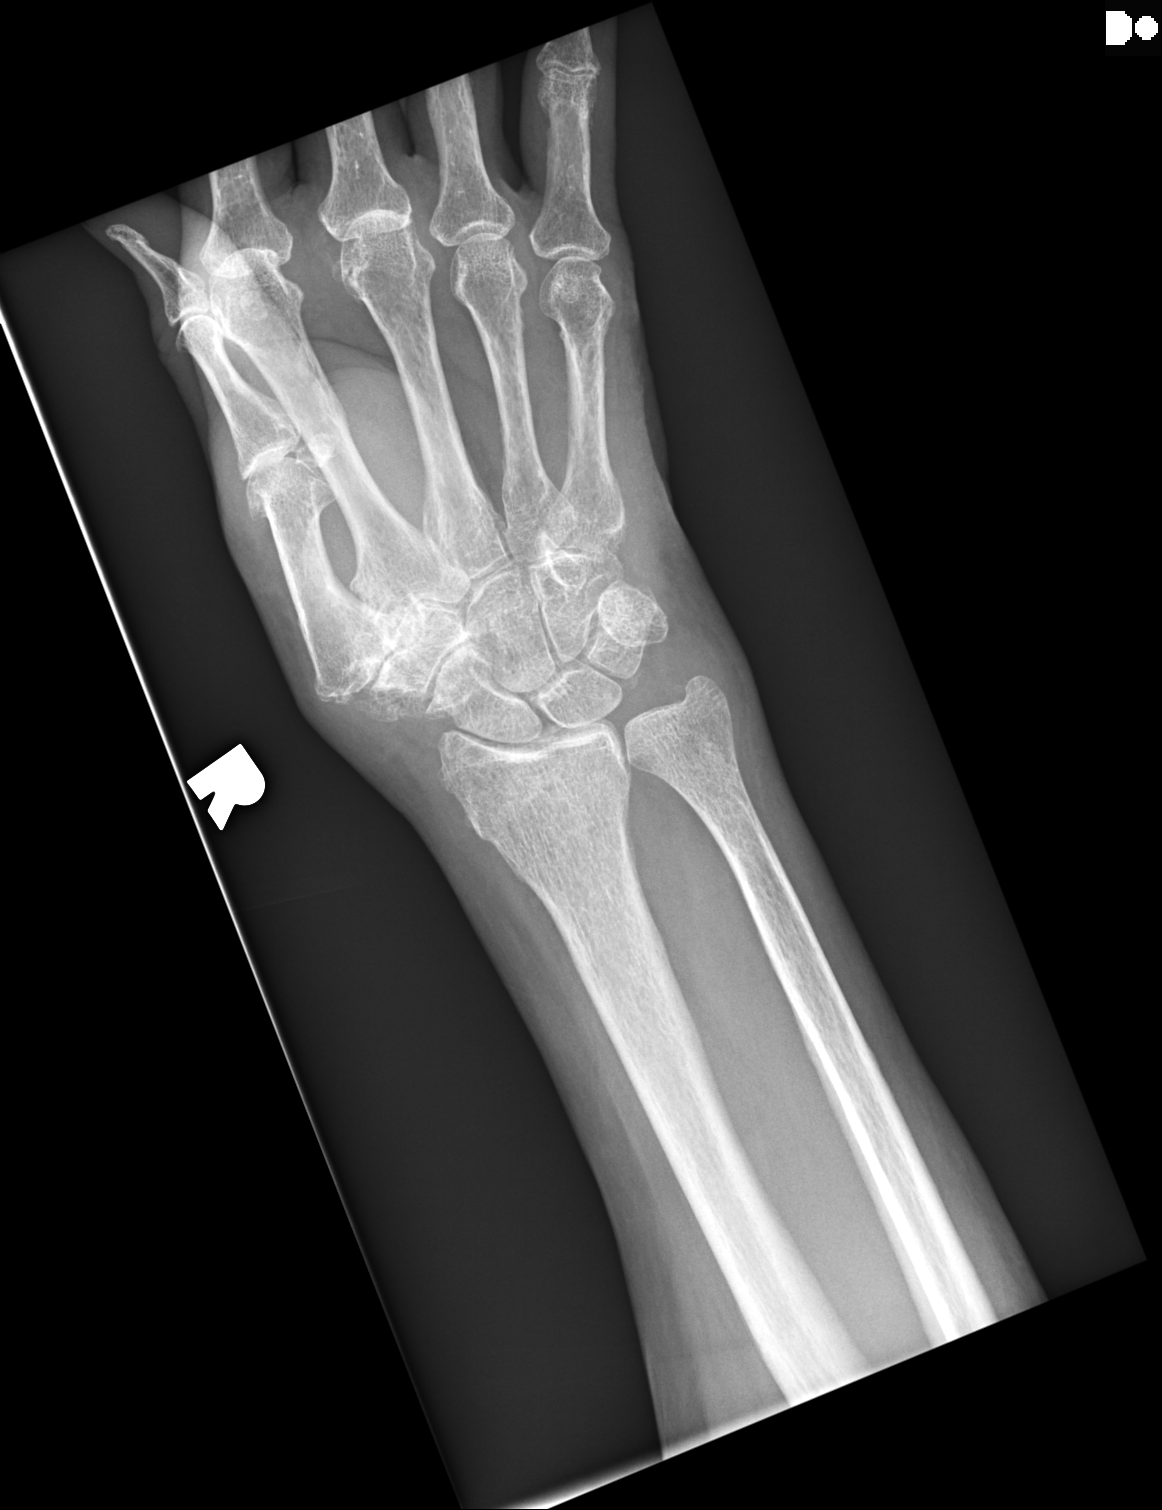
[im 2/2]
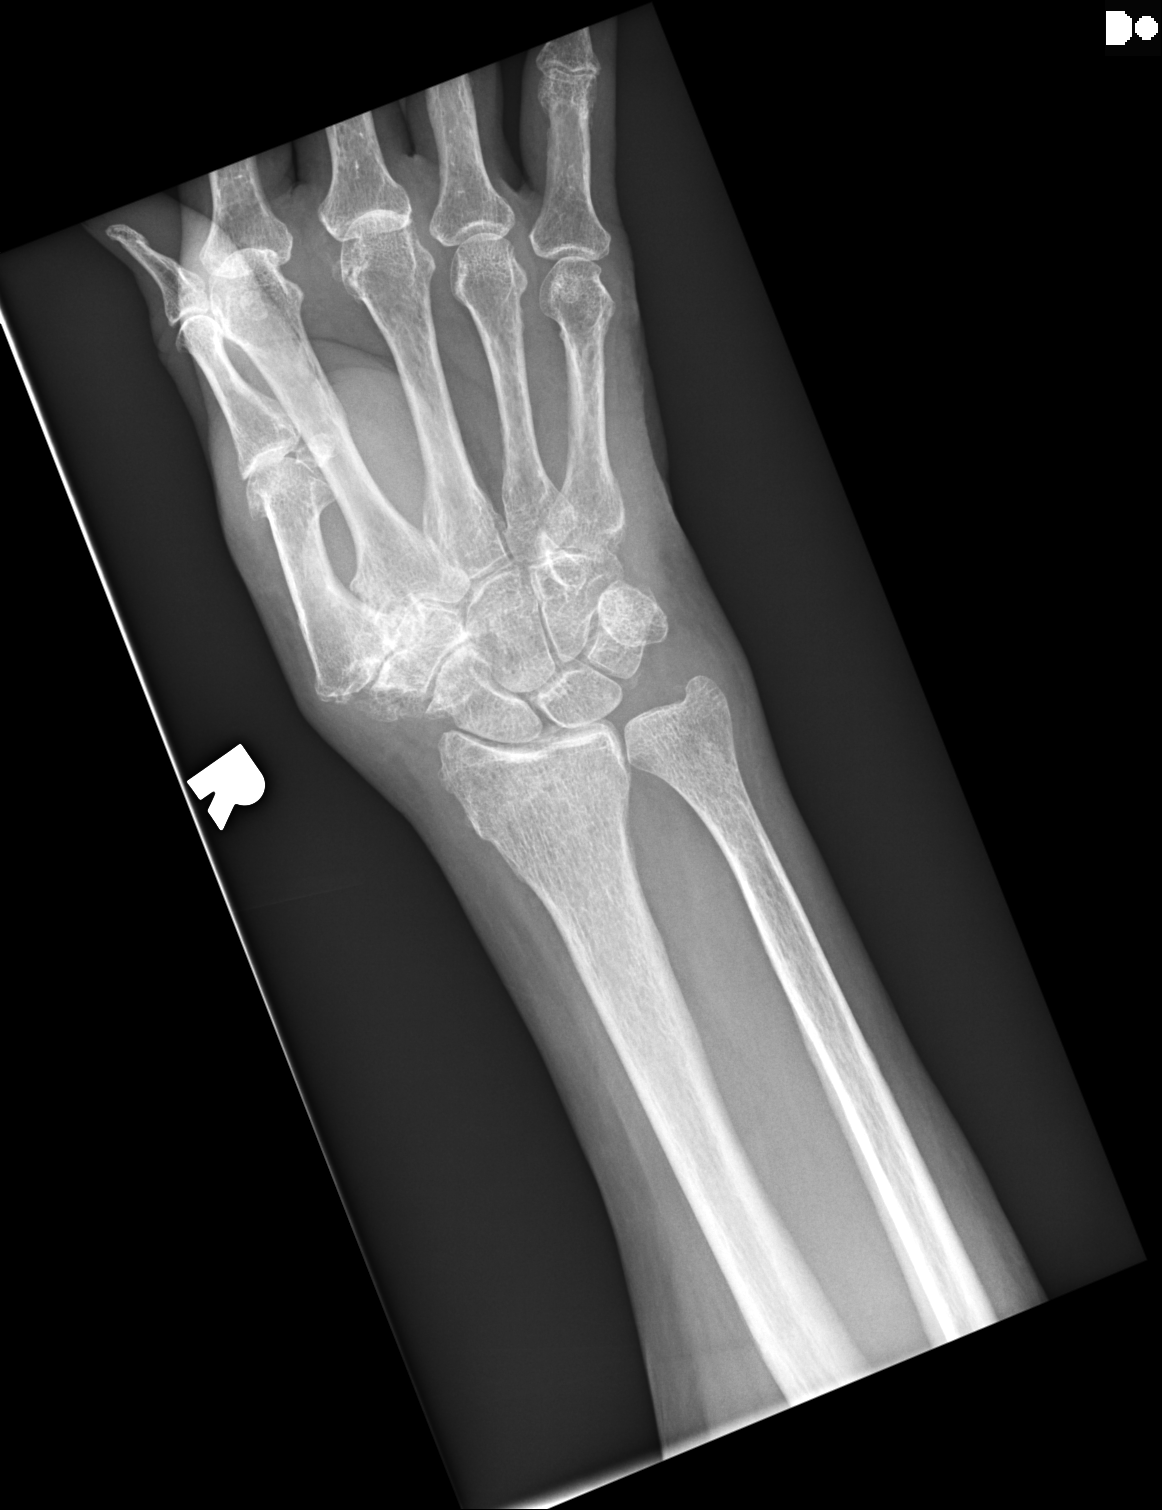

[2 of 2 positions shown; findings below may reference images not displayed]

FINDINGS: No fracture or dislocation of the bilateral wrists. The carpi are
normally aligned. There is very severe bilateral, left worse than
right basal and triscaphe joint arthrosis of the thumbs. There is
mild bilateral radiocarpal arthrosis. The soft tissues are
unremarkable.
IMPRESSION: No fracture or dislocation of the bilateral wrists. The carpi are
normally aligned. There is very severe bilateral, left worse than
right basal and triscaphe joint arthrosis of the thumbs. There is
mild bilateral radiocarpal arthrosis. The soft tissues are
unremarkable.

## 2020-09-03 IMAGING — CR LEFT WRIST - COMPLETE 3+ VIEW
4 series · 8 of 8 positions shown · non-contrast
Comparison: None.

CLINICAL DATA: Fall 2 weeks ago, diffuse wrist pain

EXAM:
RIGHT WRIST - COMPLETE 3+ VIEW; LEFT WRIST - COMPLETE 3+ VIEW

[Series 1: pa · 0.17mm/px · 2 of 2 slices shown (1 of 2)]
[im 1/2]
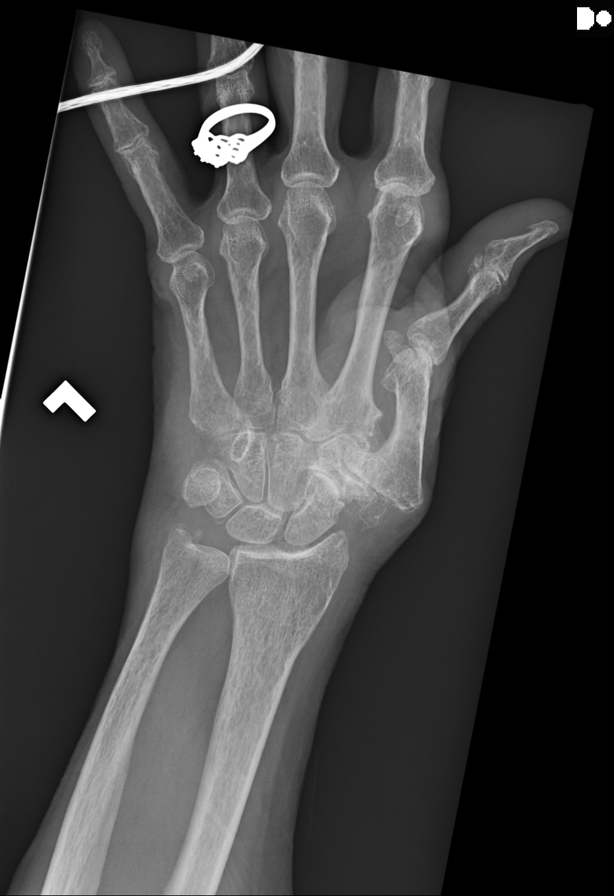
[im 2/2]
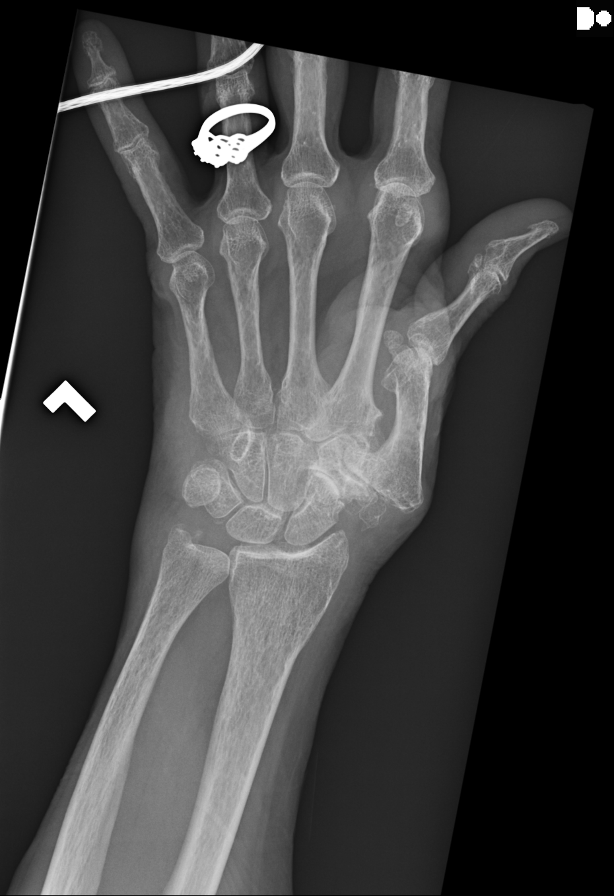

[Series 3: oblique · 0.17mm/px · 2 of 2 slices shown]
[im 1/2]
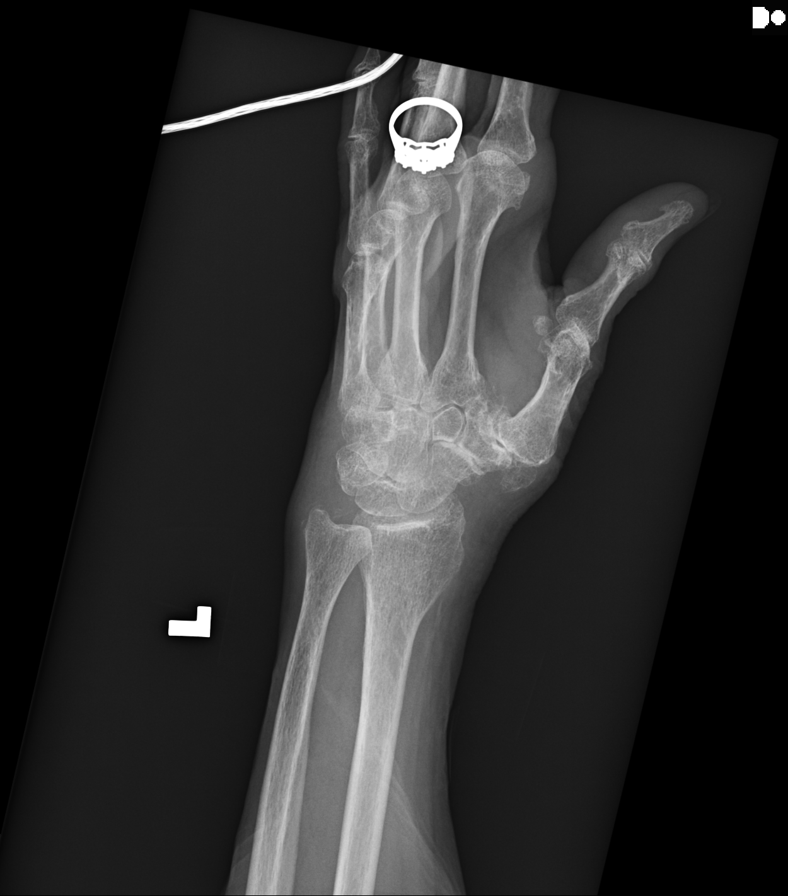
[im 2/2]
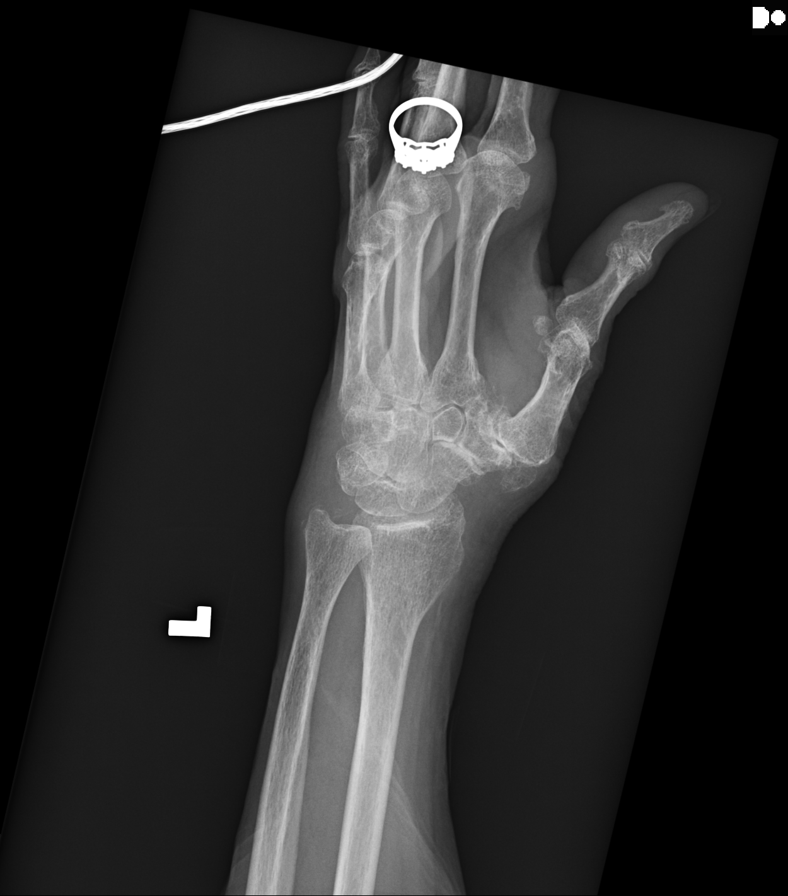

[Series 4: lat · 0.17mm/px · 2 of 2 slices shown]
[im 1/2]
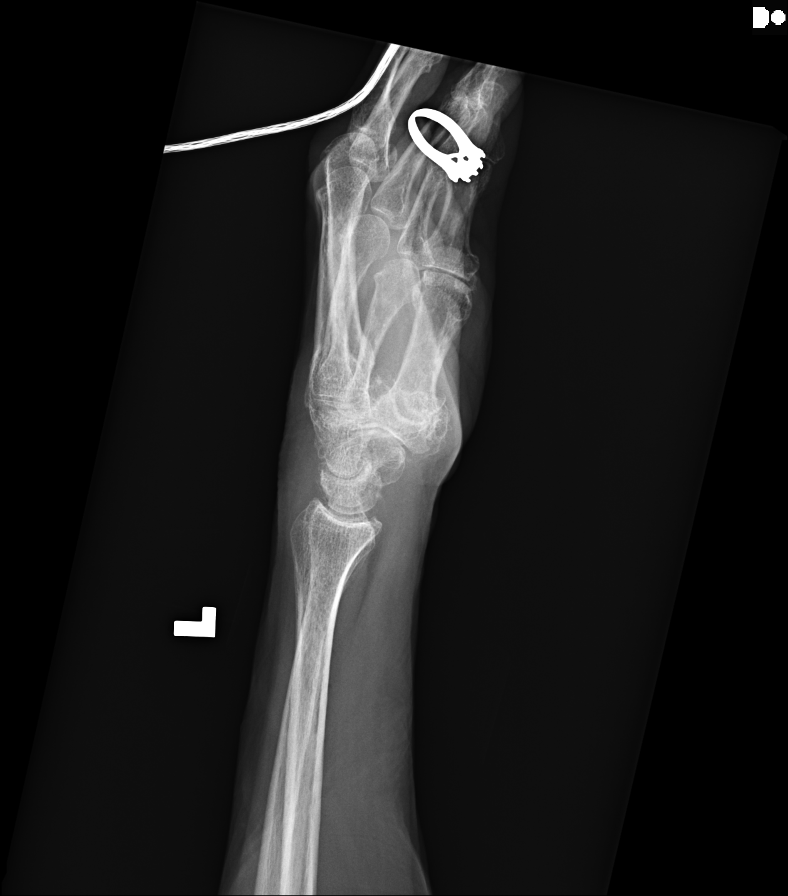
[im 2/2]
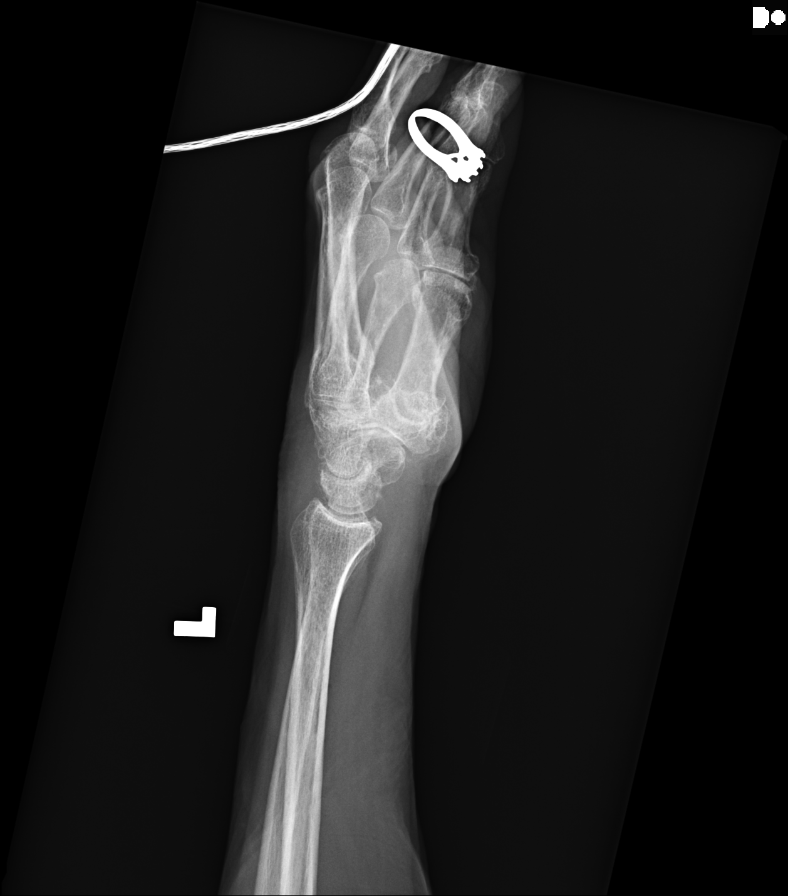

[Series 5: pa · 0.17mm/px · 2 of 2 slices shown (2 of 2)]
[im 1/2]
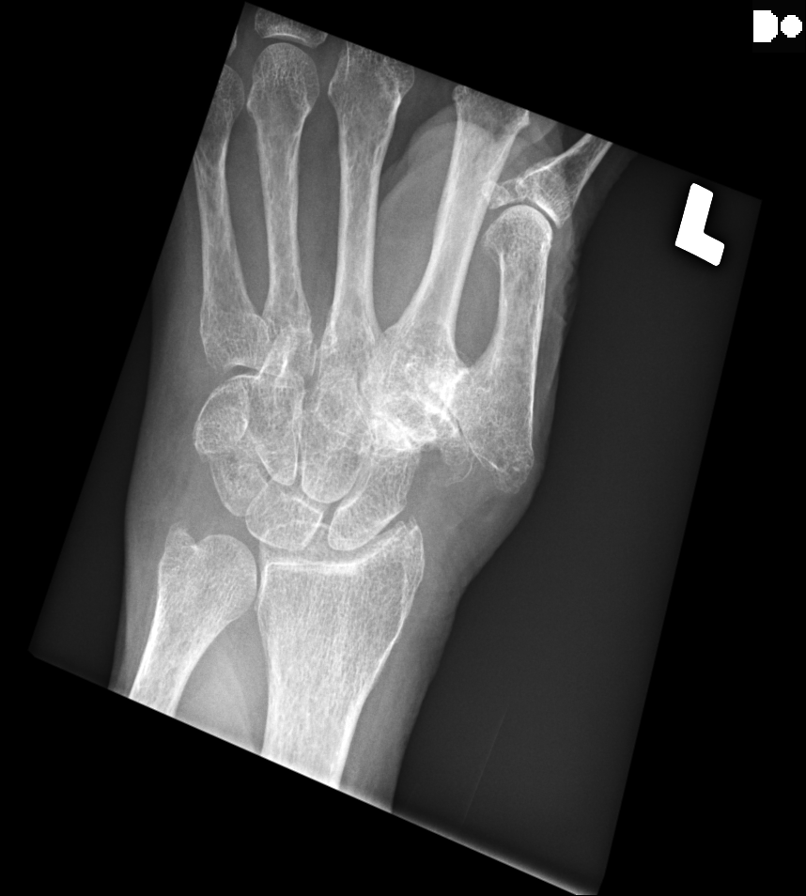
[im 2/2]
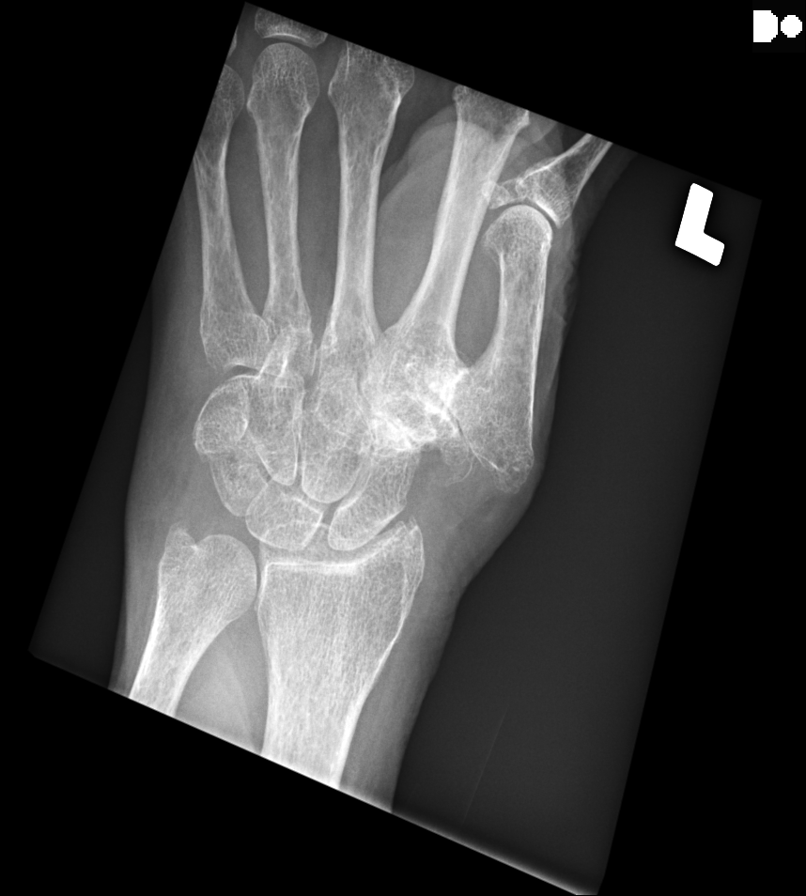

[8 of 8 positions shown; findings below may reference images not displayed]

FINDINGS: No fracture or dislocation of the bilateral wrists. The carpi are
normally aligned. There is very severe bilateral, left worse than
right basal and triscaphe joint arthrosis of the thumbs. There is
mild bilateral radiocarpal arthrosis. The soft tissues are
unremarkable.
IMPRESSION: No fracture or dislocation of the bilateral wrists. The carpi are
normally aligned. There is very severe bilateral, left worse than
right basal and triscaphe joint arthrosis of the thumbs. There is
mild bilateral radiocarpal arthrosis. The soft tissues are
unremarkable.

## 2020-09-09 ENCOUNTER — Telehealth: Payer: Self-pay

## 2020-09-22 ENCOUNTER — Ambulatory Visit (INDEPENDENT_AMBULATORY_CARE_PROVIDER_SITE_OTHER): Payer: Medicare Other

## 2020-09-22 VITALS — Ht 65.0 in | Wt 135.0 lb

## 2020-09-22 DIAGNOSIS — Z0000 Encounter for general adult medical examination without abnormal findings: Secondary | ICD-10-CM | POA: Diagnosis not present

## 2020-09-22 NOTE — Patient Instructions (Signed)
Pamela Hughes , Thank you for taking time to come for your Medicare Wellness Visit. I appreciate your ongoing commitment to your health goals. Please review the following plan we discussed and let me know if I can assist you in the future.   Screening recommendations/referrals: Colonoscopy: Done 12/17/2016 - No longer required Mammogram: Done 03/28/2020 - Repeat every year Bone Density: Done 06/06/2017 - Repeat every 2 years *DUE Recommended yearly ophthalmology/optometry visit for glaucoma screening and checkup Recommended yearly dental visit for hygiene and checkup  Vaccinations: Influenza vaccine: Done 02/25/2020 - Repeat every fall Pneumococcal vaccine: Pneumovax-23 Done 02/25/2020; Prevnar-13 Due 01/2021 Tdap vaccine: Done 02/25/2020 - Repeat 10 years Shingles vaccine: Shingrix discussed. Please contact your pharmacy for coverage information.    Covid-19:Done 09/25/2019 & 11/12/2019 - need date of boosters  Advanced directives: Advance directive discussed with you today. Even though you declined this today, please call our office should you change your mind, and we can give you the proper paperwork for you to fill out.  Conditions/risks identified: Aim for 30 minutes of exercise or brisk walking each day, drink 6-8 glasses of water and eat lots of fruits and vegetables.  Next appointment: Follow up in one year for your annual wellness visit    Preventive Care 65 Years and Older, Female Preventive care refers to lifestyle choices and visits with your health care provider that can promote health and wellness. What does preventive care include?  A yearly physical exam. This is also called an annual well check.  Dental exams once or twice a year.  Routine eye exams. Ask your health care provider how often you should have your eyes checked.  Personal lifestyle choices, including:  Daily care of your teeth and gums.  Regular physical activity.  Eating a healthy diet.  Avoiding tobacco and  drug use.  Limiting alcohol use.  Practicing safe sex.  Taking low-dose aspirin every day.  Taking vitamin and mineral supplements as recommended by your health care provider. What happens during an annual well check? The services and screenings done by your health care provider during your annual well check will depend on your age, overall health, lifestyle risk factors, and family history of disease. Counseling  Your health care provider may ask you questions about your:  Alcohol use.  Tobacco use.  Drug use.  Emotional well-being.  Home and relationship well-being.  Sexual activity.  Eating habits.  History of falls.  Memory and ability to understand (cognition).  Work and work Astronomer.  Reproductive health. Screening  You may have the following tests or measurements:  Height, weight, and BMI.  Blood pressure.  Lipid and cholesterol levels. These may be checked every 5 years, or more frequently if you are over 39 years old.  Skin check.  Lung cancer screening. You may have this screening every year starting at age 49 if you have a 30-pack-year history of smoking and currently smoke or have quit within the past 15 years.  Fecal occult blood test (FOBT) of the stool. You may have this test every year starting at age 57.  Flexible sigmoidoscopy or colonoscopy. You may have a sigmoidoscopy every 5 years or a colonoscopy every 10 years starting at age 53.  Hepatitis C blood test.  Hepatitis B blood test.  Sexually transmitted disease (STD) testing.  Diabetes screening. This is done by checking your blood sugar (glucose) after you have not eaten for a while (fasting). You may have this done every 1-3 years.  Bone density  scan. This is done to screen for osteoporosis. You may have this done starting at age 32.  Mammogram. This may be done every 1-2 years. Talk to your health care provider about how often you should have regular mammograms. Talk with your  health care provider about your test results, treatment options, and if necessary, the need for more tests. Vaccines  Your health care provider may recommend certain vaccines, such as:  Influenza vaccine. This is recommended every year.  Tetanus, diphtheria, and acellular pertussis (Tdap, Td) vaccine. You may need a Td booster every 10 years.  Zoster vaccine. You may need this after age 76.  Pneumococcal 13-valent conjugate (PCV13) vaccine. One dose is recommended after age 20.  Pneumococcal polysaccharide (PPSV23) vaccine. One dose is recommended after age 76. Talk to your health care provider about which screenings and vaccines you need and how often you need them. This information is not intended to replace advice given to you by your health care provider. Make sure you discuss any questions you have with your health care provider. Document Released: 06/13/2015 Document Revised: 02/04/2016 Document Reviewed: 03/18/2015 Elsevier Interactive Patient Education  2017 [place] Prevention in the Home Falls can cause injuries. They can happen to people of all ages. There are many things you can do to make your home safe and to help prevent falls. What can I do on the outside of my home?  Regularly fix the edges of walkways and driveways and fix any cracks.  Remove anything that might make you trip as you walk through a door, such as a raised step or threshold.  Trim any bushes or trees on the path to your home.  Use bright outdoor lighting.  Clear any walking paths of anything that might make someone trip, such as rocks or tools.  Regularly check to see if handrails are loose or broken. Make sure that both sides of any steps have handrails.  Any raised decks and porches should have guardrails on the edges.  Have any leaves, snow, or ice cleared regularly.  Use sand or salt on walking paths during winter.  Clean up any spills in your garage right away. This includes oil  or grease spills. What can I do in the bathroom?  Use night lights.  Install grab bars by the toilet and in the tub and shower. Do not use towel bars as grab bars.  Use non-skid mats or decals in the tub or shower.  If you need to sit down in the shower, use a plastic, non-slip stool.  Keep the floor dry. Clean up any water that spills on the floor as soon as it happens.  Remove soap buildup in the tub or shower regularly.  Attach bath mats securely with double-sided non-slip rug tape.  Do not have throw rugs and other things on the floor that can make you trip. What can I do in the bedroom?  Use night lights.  Make sure that you have a light by your bed that is easy to reach.  Do not use any sheets or blankets that are too big for your bed. They should not hang down onto the floor.  Have a firm chair that has side arms. You can use this for support while you get dressed.  Do not have throw rugs and other things on the floor that can make you trip. What can I do in the kitchen?  Clean up any spills right away.  Avoid walking on wet  floors.  Keep items that you use a lot in easy-to-reach places.  If you need to reach something above you, use a strong step stool that has a grab bar.  Keep electrical cords out of the way.  Do not use floor polish or wax that makes floors slippery. If you must use wax, use non-skid floor wax.  Do not have throw rugs and other things on the floor that can make you trip. What can I do with my stairs?  Do not leave any items on the stairs.  Make sure that there are handrails on both sides of the stairs and use them. Fix handrails that are broken or loose. Make sure that handrails are as long as the stairways.  Check any carpeting to make sure that it is firmly attached to the stairs. Fix any carpet that is loose or worn.  Avoid having throw rugs at the top or bottom of the stairs. If you do have throw rugs, attach them to the floor with  carpet tape.  Make sure that you have a light switch at the top of the stairs and the bottom of the stairs. If you do not have them, ask someone to add them for you. What else can I do to help prevent falls?  Wear shoes that:  Do not have high heels.  Have rubber bottoms.  Are comfortable and fit you well.  Are closed at the toe. Do not wear sandals.  If you use a stepladder:  Make sure that it is fully opened. Do not climb a closed stepladder.  Make sure that both sides of the stepladder are locked into place.  Ask someone to hold it for you, if possible.  Clearly mark and make sure that you can see:  Any grab bars or handrails.  First and last steps.  Where the edge of each step is.  Use tools that help you move around (mobility aids) if they are needed. These include:  Canes.  Walkers.  Scooters.  Crutches.  Turn on the lights when you go into a dark area. Replace any light bulbs as soon as they burn out.  Set up your furniture so you have a clear path. Avoid moving your furniture around.  If any of your floors are uneven, fix them.  If there are any pets around you, be aware of where they are.  Review your medicines with your doctor. Some medicines can make you feel dizzy. This can increase your chance of falling. Ask your doctor what other things that you can do to help prevent falls. This information is not intended to replace advice given to you by your health care provider. Make sure you discuss any questions you have with your health care provider. Document Released: 03/13/2009 Document Revised: 10/23/2015 Document Reviewed: 06/21/2014 Elsevier Interactive Patient Education  2017 [employer].

## 2020-09-22 NOTE — Progress Notes (Signed)
Subjective:   Pamela Hughes is a 80 y.o. female who presents for Medicare Annual (Subsequent) preventive examination.  Virtual Visit via Telephone Note  I connected with  [name] on 09/22/20 at  2:45 PM EDT by telephone and verified that I am speaking with the correct person using two identifiers.  Location: Patient: Home Provider: [facility] Persons participating in the virtual visit: patient/Nurse Health Advisor   I discussed the limitations, risks, security and privacy concerns of performing an evaluation and management service by telephone and the availability of in person appointments. The patient expressed understanding and agreed to proceed.  Interactive audio and video telecommunications were attempted between this nurse and patient, however failed, due to patient having technical difficulties OR patient did not have access to video capability.  We continued and completed visit with audio only.  Some vital signs may be absent or patient reported.   Raiford Fetterman E Justin Buechner, LPN   Review of Systems     Cardiac Risk Factors include: advanced age (>74men, >31 women);sedentary lifestyle;hypertension     Objective:    Today's Vitals   09/22/20 1443 09/22/20 1444  Weight: 135 lb (61.2 kg)   Height:  (1.651 m)   PainSc:  3    Body mass index is 22.47 kg/m.  Advanced Directives 09/22/2020 09/20/2019 12/22/2018 12/15/2018 09/26/2018 09/21/2018 02/17/2018  Does Patient Have a Medical Advance Directive? No No No No No No No  Does patient want to make changes to medical advance directive? - - - - - - -  Would patient like information on creating a medical advance directive? No - Patient declined No - Patient declined No - Patient declined No - Patient declined No - Patient declined No - Patient declined -    Current Medications (verified) Outpatient Encounter Medications as of 09/22/2020  Medication Sig  . albuterol (VENTOLIN HFA) 108 (90 Base) MCG/ACT inhaler USE 2 PUFFS EVERY 6  HOURS AS NEEDED  . amLODipine (NORVASC) 2.5 MG tablet Take 1 tablet (2.5 mg total) by mouth daily.  Marland Kitchen buPROPion (WELLBUTRIN SR) 150 MG 12 hr tablet Take 1 tablet (150 mg total) by mouth every morning.  Marland Kitchen doxepin (SINEQUAN) 10 MG capsule Take 1 capsule (10 mg total) by mouth at bedtime as needed.  . hydrOXYzine (ATARAX/VISTARIL) 25 MG tablet TAKE ONE TABLET 3 TIMES A DAY AS NEEDED.  Marland Kitchen ipratropium (ATROVENT) 0.03 % nasal spray USE TWICE DAILY AS DIRECTED  . loratadine (CLARITIN) 10 MG tablet Take 1 tablet (10 mg total) by mouth daily. For itching  . meclizine (ANTIVERT) 25 MG tablet TAKE ONE TABLET 3 TIMES A DAY AS NEEDED.  . metoprolol succinate (TOPROL-XL) 25 MG 24 hr tablet Take 1 tablet (25 mg total) by mouth daily.  Marland Kitchen omeprazole (PRILOSEC) 20 MG capsule Take 1 capsule (20 mg total) by mouth daily.  . rosuvastatin (CRESTOR) 10 MG tablet Take 1 tablet (10 mg total) by mouth daily.  Marland Kitchen triamcinolone cream (KENALOG) 0.1 % Apply 1 application topically 2 (two) times daily.   No facility-administered encounter medications on file as of 09/22/2020.    Allergies (verified) Patient has no known allergies.   History: Past Medical History:  Diagnosis Date  . Allergy   . Alopecia   . Anxiety   . Arthritis   . Asthma   . Collagenous colitis   . Depression   . Diarrhea    chronic- "states for at least a year)  . Edema   . GERD (gastroesophageal  reflux disease)   . HOH (hard of hearing)   . Hyperlipidemia   . Hypertension    Past Surgical History:  Procedure Laterality Date  . ABDOMINAL HYSTERECTOMY     one ovary left  . CATARACT EXTRACTION W/PHACO Right 02/17/2018   Procedure: CATARACT EXTRACTION PHACO AND INTRAOCULAR LENS PLACEMENT (IOC);  Surgeon: Fabio PierceWrzosek, James, MD;  Location: [facility];  Service: Ophthalmology;  Laterality: Right;  CDE: 4.84  . CATARACT EXTRACTION W/PHACO Left 03/31/2018   Procedure: CATARACT EXTRACTION PHACO AND INTRAOCULAR LENS PLACEMENT (IOC) LEFT CDE: 4.51;   Surgeon: Fabio PierceWrzosek, James, MD;  Location: [facility];  Service: Ophthalmology;  Laterality: Left;  . COLONOSCOPY N/A 12/17/2016   Procedure: COLONOSCOPY;  Surgeon: Malissa Hippoehman, Najeeb U, MD;  Location: [facility];  Service: Endoscopy;  Laterality: N/A;  2:05  . SPINE SURGERY  1990s   L spine   Family History  Problem Relation Age of Onset  . Alzheimer's disease Mother   . Heart disease Mother   . Stroke Mother   . Blindness Mother   . Alcohol abuse Father   . Diabetes Brother   . Healthy Daughter   . Breast cancer Maternal Aunt    Social History   Socioeconomic History  . Marital status: Divorced    Spouse name: Not on file  . Number of children: 1  . Years of education: 1410  . Highest education level: 10th grade  Occupational History  . Occupation: Retired    Comment: Designer, fashion/clothingtextiles  Tobacco Use  . Smoking status: Former Smoker    Packs/day: 0.25    Years: 5.00    Pack years: 1.25    Types: Cigarettes    Quit date: 10/09/1974    Years since quitting: 45.9  . Smokeless tobacco: Never Used  Vaping Use  . Vaping Use: Never used  Substance and Sexual Activity  . Alcohol use: Yes    Alcohol/week: 2.0 standard drinks    Types: 2 Glasses of wine per week  . Drug use: No  . Sexual activity: Yes    Birth control/protection: Surgical  Other Topics Concern  . Not on file  Social History Narrative   Lives alone   One dog    Her daughter lives nearby and checks on her often   Social Determinants of Health   Financial Resource Strain: Low Risk   . Difficulty of Paying Living Expenses: Not hard at all  Food Insecurity: No Food Insecurity  . Worried About Programme researcher, broadcasting/film/videounning Out of Food in the Last Year: Never true  . Ran Out of Food in the Last Year: Never true  Transportation Needs: No Transportation Needs  . Lack of Transportation (Medical): No  . Lack of Transportation (Non-Medical): No  Physical Activity: Insufficiently Active  . Days of Exercise per Week: 2 days  . Minutes of Exercise per  Session: 20 min  Stress: Stress Concern Present  . Feeling of Stress : To some extent  Social Connections: Socially Isolated  . Frequency of Communication with Friends and Family: Three times a week  . Frequency of Social Gatherings with Friends and Family: Once a week  . Attends Religious Services: Never  . Active Member of Clubs or Organizations: No  . Attends BankerClub or Organization Meetings: Never  . Marital Status: Divorced    Tobacco Counseling Counseling given: Not Answered   Clinical Intake:  Pre-visit preparation completed: Yes  Pain : 0-10 Pain Score: 3  Pain Type: Chronic pain Pain Location: Generalized Pain Descriptors /  Indicators: Aching Pain Onset: More than a month ago Pain Frequency: Intermittent     BMI - recorded: 22.47 Nutritional Status: BMI of 19-24  Normal Nutritional Risks: None Diabetes: No  How often do you need to have someone help you when you read instructions, pamphlets, or other written materials from your doctor or pharmacy?: 1 - Never  Diabetic? no  Interpreter Needed?: No  Information entered by :: Magnum Lunde, LPN   Activities of Daily Living In your present state of health, do you have any difficulty performing the following activities: 09/22/2020 04/01/2020  Hearing? Malvin Johns  Vision? N Y  Comment - due for eye exam in 05/2020  Difficulty concentrating or making decisions? N N  Walking or climbing stairs? N N  Dressing or bathing? N N  Doing errands, shopping? Y N  Comment - does not drive but has transportation to run Multimedia programmer and eating ? N N  Using the Toilet? N -  In the past six months, have you accidently leaked urine? N -  Do you have problems with loss of bowel control? Y -  Comment seldom has issues with her colitis -  Managing your Medications? N N  Managing your Finances? N N  Housekeeping or managing your Housekeeping? N N  Some recent data might be hidden    Patient Care Team: Dettinger, Elige Radon,  MD as PCP - General (Family Medicine) Laqueta Linden, MD (Inactive) as PCP - Cardiology (Cardiology) Malissa Hippo, MD as Consulting Physician (Gastroenterology) Gwenith Daily, RN as Case Manager Forrest, [name], LCSW as Social Worker (Licensed Clinical Social Worker) Darreld Mclean, MD as Attending Physician (Orthopedic Surgery)  Indicate any recent Medical Services you may have received from other than [facility] providers in the past year (date may be approximate).     Assessment:   This is a routine wellness examination for Shanera.  Hearing/Vision screen  Hearing Screening             Right ear:           Left ear:           Comments: C/o moderate hearing loss - she can hear, but not with clarity - she had hearing aids in the past but didn't find them very useful.  Vision Screening Comments: C/o moderate vision difficulties - Sees Dr Laural Benes every 2 years - wears reading glasses only  Dietary issues and exercise activities discussed: Current Exercise Habits: Home exercise routine, Type of exercise: walking, Time (Minutes): 20, Frequency (Times/Week): 2, Weekly Exercise (Minutes/Week): 40, Intensity: Mild, Exercise limited by: orthopedic condition(s)  Goals    .  "I need to start exercising and I don't want to fall" (pt-stated)      Current Barriers:   Knowledge Deficits related to physiological changes associated with CVA that may increase dizziness and affect balance    Care management needs related to dizziness in a patient with HTN and a hx of CVA  transportatoin  Nurse Case Manager Clinical Goal(s):  Marland Kitchen Over the next 30 days, patient will use assistive devices (walker) to help with balance and reduce fall risk . Over the next 90 days, patient will keep all scheduled medical appointments  Interventions:  . Chart reviewed including recent office notes and lab results . Discussed lab results with  patient . Previously provided education to patient re: Fall risks in the home and risk reduction techniques. . Reviewed medications with patient  . Discussed  assistive devices: walker and shower chair . Discussed current physical activity level and need to safely increase physical activity level o Recommended Senior Center programs at [employer] [facility] o [facility] will mail patient information on current Senior Center programs o Discussed walking at walking track close to patient's home . Provided with CCM contact information and encouraged to reach out as needed . Encouraged patient to reach out to PCP with any new or worsening symptoms  Patient Self Care Activities:  . Performs ADL's independently . Unable to drive but her cousin assists with transportation  Please see past updates related to this goal by clicking on the "Past Updates" button in the selected goal       .  Exercise 150 min/wk Moderate Activity      Do chair exercises daily. Refer to handout.     .  Prevent falls      Goals Addressed               This Visit's Progress   .  "I need to start exercising and I don't want to fall" (pt-stated)   On track     Current Barriers:   Knowledge Deficits related to physiological changes associated with CVA that may increase dizziness and affect balance    Care management needs related to dizziness in a patient with HTN and a hx of CVA  transportatoin  Nurse Case Manager Clinical Goal(s):  Marland Kitchen Over the next 30 days, patient will use assistive devices (walker) to help with balance and reduce fall risk . Over the next 90 days, patient will keep all scheduled medical appointments  Interventions:  . Chart reviewed including recent office notes and lab results . Discussed lab results with patient . Previously provided education to patient re: Fall risks in the home and risk reduction techniques. . Reviewed medications with patient  . Discussed assistive devices:  walker and shower chair . Discussed current physical activity level and need to safely increase physical activity level o Recommended Senior Center programs at [employer] [facility] o [facility] will mail patient information on current Senior Center programs o Discussed walking at walking track close to patient's home . Provided with CCM contact information and encouraged to reach out as needed . Encouraged patient to reach out to PCP with any new or worsening symptoms  Patient Self Care Activities:  . Performs ADL's independently . Unable to drive but her cousin assists with transportation  Please see past updates related to this goal by clicking on the "Past Updates" button in the selected goal       .  Exercise 150 min/wk Moderate Activity   Not on track     Do chair exercises daily. Refer to handout.      .  Prevent falls   On track            Depression Screen PHQ 2/9 Scores 09/22/2020 08/26/2020 03/17/2020 03/07/2020 02/25/2020 12/18/2019 09/20/2019  PHQ - 2 Score 3 2 0 0 1 1 1   PHQ- 9 Score 6 4 - 0 - - -    Fall Risk Fall Risk  09/22/2020 08/26/2020 03/17/2020 03/07/2020 02/25/2020  Falls in the past year? 0 0 0 0 0  Number falls in past yr: 0 0 - - -  Injury with Fall? 0 0 - - -  Risk for fall due to : Orthopedic patient;Impaired balance/gait;Impaired vision;History of fall(s) - - - -  Follow up Falls prevention discussed;Education provided - - - -  FALL RISK PREVENTION PERTAINING TO THE HOME:  Any stairs in or around the home? Yes  If so, are there any without handrails? No  Home free of loose throw rugs in walkways, pet beds, electrical cords, etc? Yes  Adequate lighting in your home to reduce risk of falls? Yes   ASSISTIVE DEVICES UTILIZED TO PREVENT FALLS:  Life alert? No  Use of a cane, walker or w/c? No  Grab bars in the bathroom? No  Shower chair or bench in shower? Yes  Elevated toilet seat or a handicapped toilet? No   TIMED UP AND GO:  Was the  test performed? No . Telephonic visit.  Cognitive Function: MMSE - Mini Mental State Exam 06/06/2017  Orientation to time 5  Orientation to Place 5  Registration 3  Attention/ Calculation 5  Recall 3  Language- name 2 objects 2  Language- repeat 1  Language- follow 3 step command 2  Language- read & follow direction 1  Write a sentence 1  Copy design 0  Total score 28     6CIT Screen 09/22/2020 09/20/2019  What Year? 0 points 0 points  What month? 0 points 0 points  What time? 0 points 0 points  Count back from 20 0 points 0 points  Months in reverse 0 points 2 points  Repeat phrase 2 points 0 points  Total Score 2 2    Immunizations Immunization History  Administered Date(s) Administered  . Fluad Quad(high Dose 65+) 02/25/2020  . Influenza, High Dose Seasonal PF 02/25/2016, 03/03/2017, 03/21/2018, 03/22/2019  . Influenza,inj,Quad PF,6+ Mos 02/27/2015  . Influenza-Unspecified 03/18/2014  . Moderna Sars-Covid-2 Vaccination 09/25/2019, 11/12/2019  . Pneumococcal Polysaccharide-23 02/25/2020  . Tdap 02/25/2020    TDAP status: Up to date  Flu Vaccine status: Up to date  Pneumococcal vaccine status: Up to date  Covid-19 vaccine status: Completed vaccines  Qualifies for Shingles Vaccine? Yes   Zostavax completed Yes   Shingrix Completed?: No.    Education has been provided regarding the importance of this vaccine. Patient has been advised to call insurance company to determine out of pocket expense if they have not yet received this vaccine. Advised may also receive vaccine at local pharmacy or Health Dept. Verbalized acceptance and understanding.  Screening Tests Health Maintenance  Topic Date Due  . DEXA SCAN  06/07/2019  . COVID-19 Vaccine (3 - Booster for Moderna series) 05/13/2020  . INFLUENZA VACCINE  12/29/2020  . PNA vac Low Risk Adult (2 of 2 - PCV13) 02/24/2021  . MAMMOGRAM  03/28/2022  . TETANUS/TDAP  02/24/2030  . HPV VACCINES  Aged Out    Health  Maintenance  Health Maintenance Due  Topic Date Due  . DEXA SCAN  06/07/2019  . COVID-19 Vaccine (3 - Booster for Moderna series) 05/13/2020    Colorectal cancer screening: No longer required.   Mammogram status: Completed 03/28/2020. Repeat every year  Bone Density status: Completed 06/16/2017. Results reflect: Bone density results: OSTEOPENIA. Repeat every 2 years.  Lung Cancer Screening: (Low Dose CT Chest recommended if Age 43-80 years, 30 pack-year currently smoking OR have quit w/in 15years.) does not qualify.   Additional Screening:  Hepatitis C Screening: does not qualify  Vision Screening: Recommended annual ophthalmology exams for early detection of glaucoma and other disorders of the eye. Is the patient up to date with their annual eye exam?  No  Who is the provider or what is the name of the office in which the patient attends annual eye  exams? Dr Laural Benes If pt is not established with a provider, would they like to be referred to a provider to establish care? No .   Dental Screening: Recommended annual dental exams for proper oral hygiene  Community Resource Referral / Chronic Care Management: CRR required this visit?  No   CCM required this visit?  No      Plan:     I have personally reviewed and noted the following in the patient's chart:   . Medical and social history . Use of alcohol, tobacco or illicit drugs  . Current medications and supplements . Functional ability and status . Nutritional status . Physical activity . Advanced directives . List of other physicians . Hospitalizations, surgeries, and ER visits in previous 12 months . Vitals . Screenings to include cognitive, depression, and falls . Referrals and appointments  In addition, I have reviewed and discussed with patient certain preventive protocols, quality metrics, and best practice recommendations. A written personalized care plan for preventive services as well as general preventive  health recommendations were provided to patient.     [place] Constable, LPN   06/08/3233   Nurse Notes: None

## 2020-09-26 ENCOUNTER — Ambulatory Visit (INDEPENDENT_AMBULATORY_CARE_PROVIDER_SITE_OTHER): Payer: Medicare Other | Admitting: Licensed Clinical Social Worker

## 2020-09-26 DIAGNOSIS — I10 Essential (primary) hypertension: Secondary | ICD-10-CM

## 2020-09-26 DIAGNOSIS — R0600 Dyspnea, unspecified: Secondary | ICD-10-CM

## 2020-09-26 DIAGNOSIS — M19041 Primary osteoarthritis, right hand: Secondary | ICD-10-CM

## 2020-09-26 DIAGNOSIS — I63531 Cerebral infarction due to unspecified occlusion or stenosis of right posterior cerebral artery: Secondary | ICD-10-CM

## 2020-09-26 DIAGNOSIS — F3341 Major depressive disorder, recurrent, in partial remission: Secondary | ICD-10-CM | POA: Diagnosis not present

## 2020-09-26 DIAGNOSIS — R483 Visual agnosia: Secondary | ICD-10-CM

## 2020-09-26 DIAGNOSIS — R0609 Other forms of dyspnea: Secondary | ICD-10-CM

## 2020-09-26 DIAGNOSIS — E785 Hyperlipidemia, unspecified: Secondary | ICD-10-CM

## 2020-09-26 DIAGNOSIS — M19042 Primary osteoarthritis, left hand: Secondary | ICD-10-CM | POA: Diagnosis not present

## 2020-09-26 NOTE — Chronic Care Management (AMB) (Signed)
Chronic Care Management    Clinical Social Work Note  09/26/2020 Name: Pamela Hughes MRN: [id number] DOB: 03-14-1941  Pamela Hughes is a 80 y.o. year old female who is a primary care patient of Dettinger, Elige Radon, MD. The CCM team was consulted to assist the patient with chronic disease management and/or care coordination needs related to: [name] .   Engaged with patient/Mike Mayford Knife, cousin of client,  by telephone for follow up visit in response to provider referral for social work chronic care management and care coordination services.   Consent to Services:  The patient was given information about Chronic Care Management services, agreed to services, and gave verbal consent prior to initiation of services.  Please see initial visit note for detailed documentation.   Patient agreed to services and consent obtained.   Assessment: Review of patient past medical history, allergies, medications, and health status, including review of relevant consultants reports was performed today as part of a comprehensive evaluation and provision of chronic care management and care coordination services.     SDOH (Social Determinants of Health) assessments and interventions performed:  SDOH Interventions   Flowsheet Row Most Recent Value  SDOH Interventions   Depression Interventions/Treatment  Currently on Treatment       Advanced Directives Status: See Vynca application for related entries.  CCM Care Plan  No Known Allergies  Outpatient Encounter Medications as of 09/26/2020  Medication Sig  . albuterol (VENTOLIN HFA) 108 (90 Base) MCG/ACT inhaler USE 2 PUFFS EVERY 6 HOURS AS NEEDED  . amLODipine (NORVASC) 2.5 MG tablet Take 1 tablet (2.5 mg total) by mouth daily.  Marland Kitchen buPROPion (WELLBUTRIN SR) 150 MG 12 hr tablet Take 1 tablet (150 mg total) by mouth every morning.  Marland Kitchen doxepin (SINEQUAN) 10 MG capsule Take 1 capsule (10 mg total) by mouth at bedtime as needed.  . hydrOXYzine  (ATARAX/VISTARIL) 25 MG tablet TAKE ONE TABLET 3 TIMES A DAY AS NEEDED.  Marland Kitchen ipratropium (ATROVENT) 0.03 % nasal spray USE TWICE DAILY AS DIRECTED  . loratadine (CLARITIN) 10 MG tablet Take 1 tablet (10 mg total) by mouth daily. For itching  . meclizine (ANTIVERT) 25 MG tablet TAKE ONE TABLET 3 TIMES A DAY AS NEEDED.  . metoprolol succinate (TOPROL-XL) 25 MG 24 hr tablet Take 1 tablet (25 mg total) by mouth daily.  Marland Kitchen omeprazole (PRILOSEC) 20 MG capsule Take 1 capsule (20 mg total) by mouth daily.  . rosuvastatin (CRESTOR) 10 MG tablet Take 1 tablet (10 mg total) by mouth daily.  Marland Kitchen triamcinolone cream (KENALOG) 0.1 % Apply 1 application topically 2 (two) times daily.   No facility-administered encounter medications on file as of 09/26/2020.    Patient Active Problem List   Diagnosis Date Noted  . Dyslipidemia (high LDL; low HDL) 09/19/2019  . Retroperitoneal bleed 12/14/2018  . Fall at home, initial encounter 12/14/2018  . 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 Status post CVA 09/21/2018  . Abnormal CXR 06/03/2018  . Chronic cough 05/18/2018  . DOE (dyspnea on exertion) 05/18/2018  . Collagenous colitis 02/16/2017  . Family hx of colon cancer 11/11/2016  . Osteoarthritis of both hands 06/27/2015  . 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 Left foot pain 01/31/2015  . Allergic rhinitis 01/31/2015  . Hypertension   . Depression   . Alopecia   . Arthritis     Conditions to be addressed/monitored: Monitor client management  of depression issues  Care Plan : LCSW care plan  Updates made by [name], LCSW since 09/26/2020 12:00 AM    Problem: Depression Identification (Depression)     Goal: Depressive Symptoms Identified;Manage depression symptoms faced   Start Date: 09/26/2020  Expected End Date: 12/26/2020  This Visit's Progress: On track  Priority: Medium  Note:    Current  Barriers:   Food needs  Mobility issues  Decreased family support   Social Work Clinical Goal(s):   Over the next 30 days, patient will communicate with LCSW to discuss food needs of client  Over the next 30 days, patient will keep all scheduled medical appointments   Interventions:  1:1 collaboration with Dr. Elige Radon Dettinger MD regarding development and update of comprehensive plan of care as evidenced by provider attestation and co-signature LCSW discussed with [name], cousin of client the current food needs of client   Talked with Kathlene November about ambulation needs (she said she gets dizzy on occasion) Talked with Kathlene November about social support (client said she has social support from [place], cousin) Talked with Kathlene November about frozen meals she receives monthly through [employer] Talked with Kathlene November about pain issues of client  Talked with Kathlene November about decreased energy of client Talked with Kathlene November about client completion of ADLs Talked with Kathlene November about sleeping issues of client  Talked with Kathlene November about DME of client (she has a bath chair she uses in the shower, has a walker) Encouraged client or Kathlene November to call RNCM as needed for nursing needs Talked with Kathlene November about memory issues of client   Patient Self Care Activities:   Performs ADL's independently  Unable to drive but her cousin assists with transportation  Patient Deficits Some mobility challenges Food issues   Patient Goals: Patient will call LCSW or RNCM as needed in next 30 days for CCM support Patient will attend all scheduled medical appointments in next 30 days Patient will allow time for rest and relaxation in next 30 days  Follow Up Plan:  LCSW to call client on 11/05/20           [name].Madia Carvell MSW, LCSW Licensed Clinical Social Worker [facility] Care Management [phone]

## 2020-09-26 NOTE — Patient Instructions (Signed)
Visit Information  PATIENT GOALS: Goals Addressed            This Visit's Progress   . Protect My Health;Manage Depression symptoms       Timeframe:  Short-Term Goal Priority:  Medium Progress: On Track Start Date:             09/26/20                Expected End Date:           12/26/20            Follow Up Date 11/05/20  Protect My Health (Patient) Manage Depression symptoms    Why is this important?    Screening tests can find diseases early when they are easier to treat.   Your doctor or nurse will talk with you about which tests are important for you.   Getting shots for common diseases like the flu and shingles will help prevent them.     Patient Self Care Activities:   Performs ADL's independently  Unable to drive but her cousin assists with transportation  Patient Deficits Some mobility challenges Food issues   Patient Goals: Patient will call LCSW or RNCM as needed in next 30 days for CCM support Patient will attend all scheduled medical appointments in next 30 days Patient will allow time for rest and relaxation in next 30 days  Follow Up Plan:  LCSW to call client on 11/05/20       [name].Jahmil Macleod MSW, LCSW Licensed Clinical Social Worker [facility] Care Management [phone]

## 2020-09-30 ENCOUNTER — Telehealth: Payer: Self-pay | Admitting: *Deleted

## 2020-09-30 ENCOUNTER — Ambulatory Visit: Payer: Medicare Other | Admitting: Neurology

## 2020-09-30 ENCOUNTER — Encounter: Payer: Self-pay | Admitting: Neurology

## 2020-09-30 NOTE — Telephone Encounter (Signed)
No showed new patient appointment. 

## 2020-10-29 ENCOUNTER — Other Ambulatory Visit: Payer: Self-pay | Admitting: Family Medicine

## 2020-11-05 ENCOUNTER — Ambulatory Visit (INDEPENDENT_AMBULATORY_CARE_PROVIDER_SITE_OTHER): Payer: Medicare Other | Admitting: Licensed Clinical Social Worker

## 2020-11-05 DIAGNOSIS — E785 Hyperlipidemia, unspecified: Secondary | ICD-10-CM | POA: Diagnosis not present

## 2020-11-05 DIAGNOSIS — I10 Essential (primary) hypertension: Secondary | ICD-10-CM

## 2020-11-05 DIAGNOSIS — M19042 Primary osteoarthritis, left hand: Secondary | ICD-10-CM | POA: Diagnosis not present

## 2020-11-05 DIAGNOSIS — F3341 Major depressive disorder, recurrent, in partial remission: Secondary | ICD-10-CM

## 2020-11-05 DIAGNOSIS — R483 Visual agnosia: Secondary | ICD-10-CM

## 2020-11-05 DIAGNOSIS — I63531 Cerebral infarction due to unspecified occlusion or stenosis of right posterior cerebral artery: Secondary | ICD-10-CM

## 2020-11-05 DIAGNOSIS — M19041 Primary osteoarthritis, right hand: Secondary | ICD-10-CM

## 2020-11-05 DIAGNOSIS — R0609 Other forms of dyspnea: Secondary | ICD-10-CM

## 2020-11-05 NOTE — Patient Instructions (Signed)
Visit Information  PATIENT GOALS: Goals Addressed            This Visit's Progress   . Protect My Health;Manage Depression symptoms       Timeframe:  Short-Term Goal Priority:  Medium Progress: On Track Start Date:             11/05/20                Expected End Date:          02/05/21            Follow Up Date 12/18/20  Protect My Health (Patient) Manage Depression symptoms    Why is this important?    Screening tests can find diseases early when they are easier to treat.   Your doctor or nurse will talk with you about which tests are important for you.   Getting shots for common diseases like the flu and shingles will help prevent them.     Patient Self Care Activities:   Performs ADL's independently  Unable to drive but her cousin assists with transportation  Patient Deficits Some mobility challenges Food issues   Patient Goals: Patient will call LCSW or RNCM as needed in next 30 days for CCM support Patient will attend all scheduled medical appointments in next 30 days Patient will allow time for rest and relaxation in next 30 days  Follow Up Plan:  LCSW to call client on 12/18/20       [name].Raiden Yearwood MSW, LCSW Licensed Clinical Social Worker [facility] Care Management [phone]

## 2020-11-05 NOTE — Chronic Care Management (AMB) (Signed)
Chronic Care Management    Clinical Social Work Note  11/05/2020 Name: Pamela Hughes MRN: [id number] DOB: 10/04/1940  Pamela Hughes is a 80 y.o. year old female who is a primary care patient of Dettinger, Elige Radon, MD. The CCM team was consulted to assist the patient with chronic disease management and/or care coordination needs related to: [name] .   Engaged with patient by telephone for follow up visit in response to provider referral for social work chronic care management and care coordination services.   Consent to Services:  The patient was given information about Chronic Care Management services, agreed to services, and gave verbal consent prior to initiation of services.  Please see initial visit note for detailed documentation.   Patient agreed to services and consent obtained.   Assessment: Review of patient past medical history, allergies, medications, and health status, including review of relevant consultants reports was performed today as part of a comprehensive evaluation and provision of chronic care management and care coordination services.     SDOH (Social Determinants of Health) assessments and interventions performed:  SDOH Interventions   Flowsheet Row Most Recent Value  SDOH Interventions   Depression Interventions/Treatment  Currently on Treatment       Advanced Directives Status: See Vynca application for related entries.  CCM Care Plan  No Known Allergies  Outpatient Encounter Medications as of 11/05/2020  Medication Sig  . albuterol (VENTOLIN HFA) 108 (90 Base) MCG/ACT inhaler USE 2 PUFFS EVERY 6 HOURS AS NEEDED  . amLODipine (NORVASC) 2.5 MG tablet Take 1 tablet (2.5 mg total) by mouth daily.  Marland Kitchen buPROPion (WELLBUTRIN SR) 150 MG 12 hr tablet Take 1 tablet (150 mg total) by mouth every morning.  Marland Kitchen doxepin (SINEQUAN) 10 MG capsule Take 1 capsule (10 mg total) by mouth at bedtime as needed.  . hydrOXYzine (ATARAX/VISTARIL) 25 MG tablet TAKE ONE  TABLET 3 TIMES A DAY AS NEEDED.  Marland Kitchen ipratropium (ATROVENT) 0.03 % nasal spray USE TWICE DAILY AS DIRECTED  . loratadine (CLARITIN) 10 MG tablet Take 1 tablet (10 mg total) by mouth daily. For itching  . meclizine (ANTIVERT) 25 MG tablet TAKE ONE TABLET 3 TIMES A DAY AS NEEDED.  . metoprolol succinate (TOPROL-XL) 25 MG 24 hr tablet Take 1 tablet (25 mg total) by mouth daily.  Marland Kitchen omeprazole (PRILOSEC) 20 MG capsule Take 1 capsule (20 mg total) by mouth daily.  . rosuvastatin (CRESTOR) 10 MG tablet Take 1 tablet (10 mg total) by mouth daily.  Marland Kitchen triamcinolone cream (KENALOG) 0.1 % Apply 1 application topically 2 (two) times daily.   No facility-administered encounter medications on file as of 11/05/2020.    Patient Active Problem List   Diagnosis Date Noted  . Dyslipidemia (high LDL; low HDL) 09/19/2019  . Retroperitoneal bleed 12/14/2018  . Fall at home, initial encounter 12/14/2018  . 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 Status post CVA 09/21/2018  . Abnormal CXR 06/03/2018  . Chronic cough 05/18/2018  . DOE (dyspnea on exertion) 05/18/2018  . Collagenous colitis 02/16/2017  . Family hx of colon cancer 11/11/2016  . Osteoarthritis of both hands 06/27/2015  . 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 Left foot pain 01/31/2015  . Allergic rhinitis 01/31/2015  . Hypertension   . Depression   . Alopecia   . Arthritis     Conditions to be addressed/monitored: Monitor client management of depression issues  Care Plan : LCSW care plan  Updates made by [name], LCSW since 11/05/2020 12:00 AM    Problem: Depression Identification (Depression)     Goal: Depressive Symptoms Identified;Manage depression symptoms faced   Start Date: 11/05/2020  Expected End Date: 02/05/2021  This Visit's Progress: On track  Recent Progress: On track  Priority: Medium  Note:    Current Barriers:   Food  needs  Mobility issues  Decreased family support   Social Work Clinical Goal(s):   Over the next 30 days, patient will communicate with LCSW to discuss food needs of client  Over the next 30 days, patient will keep all scheduled medical appointments   Interventions:  1:1 collaboration with Dr. Elige Radon Dettinger MD regarding development and update of comprehensive plan of care as evidenced by provider attestation and co-signature LCSW discussed with client dizziness of client  LCSW talked with client about ambulation of client LCSW talked with client today about her appointment with neurologist on July 28,2022 Talked with client about her appointment  with cardiologist on November 12, 2020 Talked with client about energy level of client Talked with client about her occasional shortness of breath Talked with client about sleeping issues of client Encouraged client to call RNCM as needed for nursing support Talked with client about medication procurement Talked with client about family support (she has support from her cousin, [name]) Talked with client about transport needs of client Talked with client about vision of client Talked with client about appetite of client Talked with client about food provision of client (she said her cousin, [name], gives her a ride to grocery store and back home in order for her to obtain food items needed) Talked with client aobut her support through [employer] food delivery (she said she receives frozen meals monthly (10 meals per month) from [employer] program) Taked with client about fall history of client Talked with client about access to her home (has some steps at front of her home) Talked with client about DME of client Talked with client about pain issues of client  talked with client about client completion of ADLs Talked with client about relaxation techniques (likes to do word search puzzles)   Patient Self Care  Activities:   Performs ADL's independently  Unable to drive but her cousin assists with transportation  Patient Deficits Some mobility challenges Food issues   Patient Goals: Patient will call LCSW or RNCM as needed in next 30 days for CCM support Patient will attend all scheduled medical appointments in next 30 days Patient will allow time for rest and relaxation in next 30 days  Follow Up Plan:  LCSW to call client on 12/18/20           [name].Shantay Sonn MSW, LCSW Licensed Clinical Social Worker [facility] Care Management [phone]

## 2020-11-11 ENCOUNTER — Encounter: Payer: Self-pay | Admitting: Cardiology

## 2020-11-11 DIAGNOSIS — I350 Nonrheumatic aortic (valve) stenosis: Secondary | ICD-10-CM | POA: Insufficient documentation

## 2020-11-11 NOTE — Progress Notes (Signed)
Cardiology Office Note   Date:  11/12/2020   ID:  Pamela Hughes, DOB 1941/01/19, MRN [id number]  PCP:  Dettinger, Elige Radon, MD  Cardiologist:   Rollene Rotunda, MD   No chief complaint on file.     History of Present Illness: Pamela Hughes is a 80 y.o. female who presents for follow up of AS and carotid stenosis  .  She was previously seen by Dr. Purvis Sheffield.  She had a previous large occipital CVA.  She has had bleeding on ASA and Plavix and these were stopped.  She has aortic stenosis.     She lives alone.  She has a dog named Financial risk analyst.  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  She has a little trouble hearing.  She is quite pleasant. The patient denies any new symptoms such as chest discomfort, neck or arm discomfort. There has been no new shortness of breath, PND or orthopnea. There have been no reported palpitations, presyncope or syncope.  She does her household chores and her own grocery shopping although she does not drive.  Past Medical History:  Diagnosis Date   Alopecia    Anxiety    Arthritis    Asthma    Collagenous colitis    Depression    Diarrhea    chronic- "states for at least a year)   Edema    GERD (gastroesophageal reflux disease)    HOH (hard of hearing)    Hyperlipidemia    Hypertension     Past Surgical History:  Procedure Laterality Date   ABDOMINAL HYSTERECTOMY     one ovary left   CATARACT EXTRACTION W/PHACO Right 02/17/2018   Procedure: CATARACT EXTRACTION PHACO AND INTRAOCULAR LENS PLACEMENT (IOC);  Surgeon: Fabio Pierce, MD;  Location: [facility];  Service: Ophthalmology;  Laterality: Right;  CDE: 4.84   CATARACT EXTRACTION W/PHACO Left 03/31/2018   Procedure: CATARACT EXTRACTION PHACO AND INTRAOCULAR LENS PLACEMENT (IOC) LEFT CDE: 4.51;  Surgeon: Fabio Pierce, MD;  Location: [facility];  Service: Ophthalmology;  Laterality: Left;   COLONOSCOPY N/A 12/17/2016   Procedure: COLONOSCOPY;  Surgeon:  Malissa Hippo, MD;  Location: [facility] SUITE;  Service: Endoscopy;  Laterality: N/A;  2:05   SPINE SURGERY  1990s   L spine     Current Outpatient Medications  Medication Sig Dispense Refill   albuterol (VENTOLIN HFA) 108 (90 Base) MCG/ACT inhaler USE 2 PUFFS EVERY 6 HOURS AS NEEDED 8.5 g 0   amLODipine (NORVASC) 2.5 MG tablet Take 1 tablet (2.5 mg total) by mouth daily. 90 tablet 3   buPROPion (WELLBUTRIN SR) 150 MG 12 hr tablet Take 1 tablet (150 mg total) by mouth every morning. 90 tablet 3   doxepin (SINEQUAN) 10 MG capsule Take 1 capsule (10 mg total) by mouth at bedtime as needed. 90 capsule 1   hydrOXYzine (ATARAX/VISTARIL) 25 MG tablet TAKE ONE TABLET 3 TIMES A DAY AS NEEDED. 30 tablet 3   ipratropium (ATROVENT) 0.03 % nasal spray USE TWICE DAILY AS DIRECTED 30 mL 0   loratadine (CLARITIN) 10 MG tablet Take 1 tablet (10 mg total) by mouth daily. For itching 30 tablet 11   meclizine (ANTIVERT) 25 MG tablet TAKE ONE TABLET 3 TIMES A DAY AS NEEDED. 30 tablet 1   metoprolol succinate (TOPROL-XL) 25 MG 24 hr tablet Take 1 tablet (25 mg total) by mouth daily. 90 tablet 3   omeprazole (  PRILOSEC) 20 MG capsule Take 1 capsule (20 mg total) by mouth daily. 90 capsule 3   rosuvastatin (CRESTOR) 10 MG tablet Take 1 tablet (10 mg total) by mouth daily. 90 tablet 3   triamcinolone cream (KENALOG) 0.1 % Apply 1 application topically 2 (two) times daily. 453 g 2   No current facility-administered medications for this visit.    Allergies:   Patient has no known allergies.    ROS:  Please see the history of present illness.   Otherwise, review of systems are positive for none.   All other systems are reviewed and negative.    PHYSICAL EXAM: VS:  BP 130/70   Pulse 62   Ht 5\' 5"  (1.651 m)   Wt 139 lb (63 kg)   SpO2 95%   BMI 23.13 kg/m  , BMI Body mass index is 23.13 kg/m. GENERAL:  Well appearing NECK:  No jugular venous distention, waveform within normal limits, carotid upstroke brisk  and symmetric, left carotid bruits, no thyromegaly LUNGS:  Clear to auscultation bilaterally CHEST:  Unremarkable HEART:  PMI not displaced or sustained,S1 and S2 within normal limits, no S3, no S4, no clicks, no rubs, 3 out of 6 apical systolic murmur radiating slightly at the aortic outflow tract, no diastolic murmurs ABD:  Flat, positive bowel sounds normal in frequency in pitch, no bruits, no rebound, no guarding, no midline pulsatile mass, no hepatomegaly, no splenomegaly EXT:  2 plus pulses throughout, no edema, no cyanosis no clubbing   EKG:  EKG is ordered today. The ekg ordered today demonstrates normal sinus rhythm, rate 62, axis within normal limits, intervals within normal limits, no acute ST-T wave changes.   Recent Labs: 08/26/2020: ALT 16; BUN 21; Creatinine, Ser 1.21; Hemoglobin 12.3; Platelets 299; Potassium 4.4; Sodium 138    Lipid Panel    Component Value Date/Time   CHOL 183 08/26/2020 1445   TRIG 56 08/26/2020 1445   HDL 101 08/26/2020 1445   CHOLHDL 1.8 08/26/2020 1445   CHOLHDL 1.7 09/22/2018 0432   VLDL 8 09/22/2018 0432   LDLCALC 71 08/26/2020 1445   LDLDIRECT 165 (H) 07/11/2017 1555      Wt Readings from Last 3 Encounters:  11/12/20 139 lb (63 kg)  09/22/20 135 lb (61.2 kg)  08/26/20 138 lb 9.6 oz (62.9 kg)      Other studies Reviewed: Additional studies/ records that were reviewed today include: Previous studies and labs. Review of the above records demonstrates:  Please see elsewhere in the note.     ASSESSMENT AND PLAN:  Aortic stenosis: Mild in severity by echo on 09/22/18.  She was supposed to get an echo in April.   I will order repeat echocardiogram.  I suspect her stenosis was moderate.    Hypertension:   The blood pressure is at target.  No change in therapy.    Bilateral carotid artery stenosis:   She had bilateral less than 50% stenosis but is overdue for follow-up.  I will order carotid Dopplers.   Hypercholesterolemia:   LDL was  LDL 71 with an HDL of 101.  No change in therapy.    CVA: She has not tolerated ASA or Plavix.     Current medicines are reviewed at length with the patient today.  The patient does not have concerns regarding medicines.  The following changes have been made:  no change  Labs/ tests ordered today include:   Orders Placed This Encounter  Procedures   EKG 12-Lead  ECHOCARDIOGRAM COMPLETE   VAS US CAROTID     Disposition:   FU with me in one year.     Signed, Rollene Rotunda, MD  11/12/2020 2:42 PM    [facility]

## 2020-11-12 ENCOUNTER — Ambulatory Visit (INDEPENDENT_AMBULATORY_CARE_PROVIDER_SITE_OTHER): Payer: Medicare Other | Admitting: Cardiology

## 2020-11-12 ENCOUNTER — Encounter: Payer: Self-pay | Admitting: Cardiology

## 2020-11-12 VITALS — BP 130/70 | HR 62 | Ht 65.0 in | Wt 139.0 lb

## 2020-11-12 DIAGNOSIS — I63531 Cerebral infarction due to unspecified occlusion or stenosis of right posterior cerebral artery: Secondary | ICD-10-CM

## 2020-11-12 DIAGNOSIS — I10 Essential (primary) hypertension: Secondary | ICD-10-CM | POA: Diagnosis not present

## 2020-11-12 DIAGNOSIS — E785 Hyperlipidemia, unspecified: Secondary | ICD-10-CM

## 2020-11-12 DIAGNOSIS — I350 Nonrheumatic aortic (valve) stenosis: Secondary | ICD-10-CM | POA: Diagnosis not present

## 2020-11-12 DIAGNOSIS — I6523 Occlusion and stenosis of bilateral carotid arteries: Secondary | ICD-10-CM | POA: Diagnosis not present

## 2020-11-12 NOTE — Patient Instructions (Addendum)
Medication Instructions:  Your physician recommends that you continue on your current medications as directed. Please refer to the Current Medication list given to you today.  Labwork: none  Testing/Procedures: Your physician has requested that you have an echocardiogram. Echocardiography is a painless test that uses sound waves to create images of your heart. It provides your doctor with information about the size and shape of your heart and how well your heart's chambers and valves are working. This procedure takes approximately one hour. There are no restrictions for this procedure. Your physician has requested that you have a carotid duplex. This test is an ultrasound of the carotid arteries in your neck. It looks at blood flow through these arteries that supply the brain with blood. Allow one hour for this exam. There are no restrictions or special instructions.  Follow-Up: Your physician recommends that you schedule a follow-up appointment in: 1 year in [place]. You will receive a reminder call or letter in the mail in about 10 months reminding you to call and schedule your appointment. If you don't receive this letter, please contact our office.  Any Other Special Instructions Will Be Listed Below (If Applicable).  If you need a refill on your cardiac medications before your next appointment, please call your pharmacy.

## 2020-12-08 ENCOUNTER — Ambulatory Visit (INDEPENDENT_AMBULATORY_CARE_PROVIDER_SITE_OTHER): Payer: Medicare Other

## 2020-12-08 ENCOUNTER — Other Ambulatory Visit: Payer: Self-pay

## 2020-12-08 DIAGNOSIS — I6523 Occlusion and stenosis of bilateral carotid arteries: Secondary | ICD-10-CM

## 2020-12-08 DIAGNOSIS — I350 Nonrheumatic aortic (valve) stenosis: Secondary | ICD-10-CM

## 2020-12-08 LAB — ECHOCARDIOGRAM COMPLETE
AR max vel: 1.03 cm2
AV Area VTI: 1.03 cm2
AV Area mean vel: 1.14 cm2
AV Mean grad: 12.3 mmHg
AV Peak grad: 24.2 mmHg
AV Vena cont: 0.38 cm
Ao pk vel: 2.46 m/s
Area-P 1/2: 3.3 cm2
Calc EF: 60.1 %
MV M vel: 5.2 m/s
MV Peak grad: 108.2 mmHg
P 1/2 time: 473 msec
Radius: 0.25 cm
S' Lateral: 3.03 cm
Single Plane A2C EF: 60.1 %
Single Plane A4C EF: 61.4 %

## 2020-12-10 ENCOUNTER — Encounter: Payer: Self-pay | Admitting: *Deleted

## 2020-12-12 ENCOUNTER — Encounter: Payer: Self-pay | Admitting: *Deleted

## 2020-12-15 ENCOUNTER — Telehealth: Payer: Self-pay | Admitting: Cardiology

## 2020-12-15 NOTE — Telephone Encounter (Signed)
Echo-Mild to moderate MR and AS.  No change in therapy.  I will follow this clinically.  EF is normal.  Call [name] with the results and send results toDettinger, Elige Radon, MD   Rollene Rotunda, MD  12/11/2020  4:54 PM EDT      Carotid-Right Carotid: There is no evidence of stenosis in the right ICA. Left Carotid: Velocities in the left ICA are consistent with a 1-39% stenosis. No evidence of stenosis that requires follow up.   Call [name] with the results and send results to Dettinger, Elige Radon, MD

## 2020-12-15 NOTE — Telephone Encounter (Signed)
Patient informed and verbalized understanding of plan. 

## 2020-12-15 NOTE — Telephone Encounter (Signed)
Patient called wanted to get results from her Tests done 7/8 in [place]. She said someone mailed them to her but she does not understand them. Please call her at #[phone].

## 2020-12-18 ENCOUNTER — Ambulatory Visit (INDEPENDENT_AMBULATORY_CARE_PROVIDER_SITE_OTHER): Payer: Medicare Other | Admitting: Licensed Clinical Social Worker

## 2020-12-18 DIAGNOSIS — F3341 Major depressive disorder, recurrent, in partial remission: Secondary | ICD-10-CM

## 2020-12-18 DIAGNOSIS — M19041 Primary osteoarthritis, right hand: Secondary | ICD-10-CM | POA: Diagnosis not present

## 2020-12-18 DIAGNOSIS — R0600 Dyspnea, unspecified: Secondary | ICD-10-CM

## 2020-12-18 DIAGNOSIS — E785 Hyperlipidemia, unspecified: Secondary | ICD-10-CM | POA: Diagnosis not present

## 2020-12-18 DIAGNOSIS — M19042 Primary osteoarthritis, left hand: Secondary | ICD-10-CM | POA: Diagnosis not present

## 2020-12-18 DIAGNOSIS — I63531 Cerebral infarction due to unspecified occlusion or stenosis of right posterior cerebral artery: Secondary | ICD-10-CM | POA: Diagnosis not present

## 2020-12-18 DIAGNOSIS — R483 Visual agnosia: Secondary | ICD-10-CM

## 2020-12-18 DIAGNOSIS — I10 Essential (primary) hypertension: Secondary | ICD-10-CM | POA: Diagnosis not present

## 2020-12-18 DIAGNOSIS — R0609 Other forms of dyspnea: Secondary | ICD-10-CM

## 2020-12-18 NOTE — Chronic Care Management (AMB) (Signed)
Chronic Care Management    Clinical Social Work Note  12/18/2020 Name: Pamela Hughes MRN: [id number] DOB: [birth year]  Pamela Hughes is a 80 y.o. year old female who is a primary care patient of Dettinger, Elige Radon, MD. The CCM team was consulted to assist the patient with chronic disease management and/or care coordination needs related to: [name] .   Engaged with patient by telephone for follow up visit in response to provider referral for social work chronic care management and care coordination services.   Consent to Services:  The patient was given information about Chronic Care Management services, agreed to services, and gave verbal consent prior to initiation of services.  Please see initial visit note for detailed documentation.   Patient agreed to services and consent obtained.   Assessment: Review of patient past medical history, allergies, medications, and health status, including review of relevant consultants reports was performed today as part of a comprehensive evaluation and provision of chronic care management and care coordination services.     SDOH (Social Determinants of Health) assessments and interventions performed:  SDOH Interventions    Flowsheet Row Most Recent Value  SDOH Interventions   Depression Interventions/Treatment  Currently on Treatment        Advanced Directives Status: See Vynca application for related entries.  CCM Care Plan  No Known Allergies  Outpatient Encounter Medications as of 12/18/2020  Medication Sig   albuterol (VENTOLIN HFA) 108 (90 Base) MCG/ACT inhaler USE 2 PUFFS EVERY 6 HOURS AS NEEDED   amLODipine (NORVASC) 2.5 MG tablet Take 1 tablet (2.5 mg total) by mouth daily.   buPROPion (WELLBUTRIN SR) 150 MG 12 hr tablet Take 1 tablet (150 mg total) by mouth every morning.   doxepin (SINEQUAN) 10 MG capsule Take 1 capsule (10 mg total) by mouth at bedtime as needed.   hydrOXYzine (ATARAX/VISTARIL) 25 MG tablet TAKE ONE  TABLET 3 TIMES A DAY AS NEEDED.   ipratropium (ATROVENT) 0.03 % nasal spray USE TWICE DAILY AS DIRECTED   loratadine (CLARITIN) 10 MG tablet Take 1 tablet (10 mg total) by mouth daily. For itching   meclizine (ANTIVERT) 25 MG tablet TAKE ONE TABLET 3 TIMES A DAY AS NEEDED.   metoprolol succinate (TOPROL-XL) 25 MG 24 hr tablet Take 1 tablet (25 mg total) by mouth daily.   omeprazole (PRILOSEC) 20 MG capsule Take 1 capsule (20 mg total) by mouth daily.   rosuvastatin (CRESTOR) 10 MG tablet Take 1 tablet (10 mg total) by mouth daily.   triamcinolone cream (KENALOG) 0.1 % Apply 1 application topically 2 (two) times daily.   No facility-administered encounter medications on file as of 12/18/2020.    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   Left foot pain 01/31/2015   Allergic rhinitis 01/31/2015   Hypertension    Depression    Alopecia    Arthritis     Conditions to be addressed/monitored:  monitor client management of her food needs   Care Plan : LCSW care plan  Updates made by [name], LCSW since 12/18/2020 12:00 AM     Problem: Depression Identification (Depression)      Goal: Depressive Symptoms Identified;Manage depression symptoms faced   Start Date: 12/18/2020  Expected End Date: 03/17/2021  This Visit's Progress: On track  Recent Progress: On track  Priority: Medium  Note:    Current  Barriers:  Food needs Mobility issues Decreased family support   Social Work Clinical Goal(s):  Over the next 30 days, patient will communicate with LCSW to discuss food needs of client Over the next 30 days, patient will keep all scheduled medical appointments   Interventions:  1:1 collaboration with Dr. Elige Radon Dettinger MD regarding development and update of comprehensive plan of care as evidenced by provider attestation and co-signature LCSW discussed with client dizziness of client  Talked with client about her weakness and fatigue occasionally LCSW talked with client about ambulation of client LCSW talked with client today about her appointment with neurologist on July 28,2022 Talked with client about her occasional shortness of breath Talked with client about sleeping issues of client Encouraged client to call RNCM as needed for nursing support Talked with client about medication procurement Talked with client about family support (she has support from her cousin, [name]) Talked with client about transport needs of client Talked with client about vision of client Talked with client about appetite of client Talked with client about food provision of client (she said her cousin, [name], gives her a ride to grocery store and back home in order for her to obtain food items needed) Talked with client about her support through [employer] food delivery (she said she receives frozen meals monthly (10 meals per month) from [employer] program) Talked with client about food support through [facility] healthy choice program Taked with client about fall history of client Talked with client about access to her home (has some steps at front of her home) Talked with client about DME of client (has a 3 in 1 bedside commode and has a walker) Talked with client about pain issues of client  talked with client about client completion of ADLs Talked with client about relaxation  techniques (likes to do word search puzzles)   Patient Self Care Activities:  Performs ADL's independently Unable to drive but her cousin assists with transportation  Patient Deficits Some mobility challenges Food issues   Patient Goals: Patient will call LCSW or RNCM as needed in next 30 days for CCM support Patient will attend all scheduled medical appointments in next 30 days Patient will allow time for rest and relaxation in next 30 days  Follow Up Plan:  LCSW to call client on 01/27/21         [name].Fines Kimberlin MSW, LCSW Licensed Clinical Social Worker [facility] Care Management [phone]

## 2020-12-18 NOTE — Patient Instructions (Signed)
Visit Information  PATIENT GOALS:  Goals Addressed             This Visit's Progress    Manage My Emotions       Protect My Health;Manage Depression symptoms       Timeframe:  Short-Term Goal Priority:  Medium Progress: On Track Start Date:             12/18/20                Expected End Date:        03/17/21           Follow Up Date 01/27/21  Protect My Health (Patient) Manage Depression symptoms    Why is this important?   Screening tests can find diseases early when they are easier to treat.  Your doctor or nurse will talk with you about which tests are important for you.  Getting shots for common diseases like the flu and shingles will help prevent them.     Patient Self Care Activities:  Performs ADL's independently Unable to drive but her cousin assists with transportation  Patient Deficits Some mobility challenges Food issues   Patient Goals: Patient will call LCSW or RNCM as needed in next 30 days for CCM support Patient will attend all scheduled medical appointments in next 30 days Patient will allow time for rest and relaxation in next 30 days  Follow Up Plan:  LCSW to call client on 01/27/21     Pamela Hughes.Pamela Hughes MSW, LCSW Licensed Clinical Social Worker [facility] Care Management [phone]

## 2020-12-25 ENCOUNTER — Ambulatory Visit: Payer: Medicare Other | Admitting: Neurology

## 2020-12-30 ENCOUNTER — Other Ambulatory Visit: Payer: Self-pay | Admitting: Family Medicine

## 2021-01-05 ENCOUNTER — Telehealth: Payer: Self-pay | Admitting: Neurology

## 2021-01-05 ENCOUNTER — Ambulatory Visit (INDEPENDENT_AMBULATORY_CARE_PROVIDER_SITE_OTHER): Payer: Medicare Other | Admitting: Neurology

## 2021-01-05 ENCOUNTER — Encounter: Payer: Self-pay | Admitting: Neurology

## 2021-01-05 ENCOUNTER — Other Ambulatory Visit: Payer: Self-pay | Admitting: Family Medicine

## 2021-01-05 VITALS — BP 152/70 | HR 57 | Ht 65.0 in | Wt 139.0 lb

## 2021-01-05 DIAGNOSIS — I6523 Occlusion and stenosis of bilateral carotid arteries: Secondary | ICD-10-CM | POA: Diagnosis not present

## 2021-01-05 DIAGNOSIS — Z8673 Personal history of transient ischemic attack (TIA), and cerebral infarction without residual deficits: Secondary | ICD-10-CM

## 2021-01-05 DIAGNOSIS — R42 Dizziness and giddiness: Secondary | ICD-10-CM | POA: Diagnosis not present

## 2021-01-05 DIAGNOSIS — I350 Nonrheumatic aortic (valve) stenosis: Secondary | ICD-10-CM

## 2021-01-05 DIAGNOSIS — H53462 Homonymous bilateral field defects, left side: Secondary | ICD-10-CM | POA: Diagnosis not present

## 2021-01-05 NOTE — Patient Instructions (Signed)
Continue with meclizine as needed for dizziness  MRI Brain to evaluate for additional strokes that can account for the dizziness  Use of compression stockings  Recommend use of walker (you have lost part of your vision on the left side)  Return to clinic in 6 months or sooner if worse. Will contact you after completion of Brain MRI.

## 2021-01-05 NOTE — Progress Notes (Signed)
[facility]  PATIENT: Pamela Hughes CommentDoris P Lockner DOB: 1940-07-18  REFERRING CLINICIAN: Dettinger, Elige RadonJoshua A, MD HISTORY FROM: Patient  REASON FOR VISIT: Dizziness    HISTORICAL  CHIEF COMPLAINT:  Chief Complaint  Patient presents with   New Patient (Initial Visit)    Rm 13, alone, c/o dizziness,states it occurs when she moves up and down or moves her head left to right. States meclizine does not help much. 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  Patient report dizziness worsening since the stroke in June 2021, described as "make me look like a zombie", but on chart review she had a stroke in April 2020.  She states If looking down will make her fall forward and if looking back, make her fall backward. Dizziness is constant and worse with movement.  She has been prescribed meclizine but reports minimal benefit.  Because of the dizziness she had falls with bruises but no broken bones.  Last fall was 4 months ago.  She reports that dizziness is affecting her life, mostly her social life, she is not going out with friends as before because of fear of falling.  Patient reported after her stroke she was admitted to a rehab,  she used a walker while in rehab but since being discharged she has not been using a walker or cane. She has decrease exercise tolerance due to dizziness   She also has a history of bilateral carotid stenosis, aortic valve stenosis, and hypertension to blood pressure medications.  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 90 tablet 3   buPROPion (WELLBUTRIN SR) 150 MG 12 hr tablet Take 1 tablet (150 mg total) by mouth every morning. 90 tablet 3   hydrOXYzine (ATARAX/VISTARIL) 25 MG tablet TAKE ONE TABLET 3 TIMES A DAY AS NEEDED. 30 tablet 3   ipratropium (ATROVENT) 0.03 % nasal spray USE TWICE DAILY AS DIRECTED 30 mL 0   loratadine (CLARITIN) 10 MG tablet Take 1 tablet (10 mg total) by mouth daily. For itching 30 tablet 11   meclizine (ANTIVERT) 25 MG tablet TAKE ONE TABLET 3 TIMES A DAY AS NEEDED. 30 tablet 1   metoprolol succinate (TOPROL-XL) 25 MG 24 hr tablet Take 1 tablet (25 mg total) by mouth daily. 90 tablet 3   omeprazole (PRILOSEC) 20 MG capsule Take 1 capsule (20 mg total) by mouth daily. 90 capsule 3   rosuvastatin (CRESTOR) 10 MG tablet Take 1 tablet (10 mg total) by mouth daily. 90 tablet 3   triamcinolone cream (KENALOG) 0.1 % APPLY TWICE DAILY 454 g 0   doxepin (SINEQUAN) 10 MG capsule Take 1 capsule (10 mg total) by mouth at bedtime as needed. 90 capsule 1   No facility-administered  medications prior to visit.    PAST MEDICAL HISTORY: Past Medical History:  Diagnosis Date   Alopecia    Anxiety    Arthritis    Asthma    Collagenous colitis    Depression    Diarrhea    chronic- "states for at least a year)   Edema    GERD (gastroesophageal reflux disease)    HOH (hard of hearing)    Hyperlipidemia    Hypertension     PAST SURGICAL HISTORY: Past Surgical History:  Procedure Laterality Date   ABDOMINAL HYSTERECTOMY     one ovary left   CATARACT EXTRACTION W/PHACO Right 02/17/2018   Procedure: CATARACT EXTRACTION PHACO AND INTRAOCULAR LENS PLACEMENT (IOC);  Surgeon:  Fabio Pierce, MD;  Location: [facility];  Service: Ophthalmology;  Laterality: Right;  CDE: 4.84   CATARACT EXTRACTION W/PHACO Left 03/31/2018   Procedure: CATARACT EXTRACTION PHACO AND INTRAOCULAR LENS PLACEMENT (IOC) LEFT CDE: 4.51;  Surgeon: Fabio Pierce, MD;  Location: [facility];  Service: Ophthalmology;  Laterality: Left;   COLONOSCOPY N/A 12/17/2016   Procedure: COLONOSCOPY;  Surgeon: Malissa Hippo, MD;  Location: [facility];  Service: Endoscopy;  Laterality: N/A;  2:05   SPINE SURGERY  1990s   L spine    FAMILY HISTORY: Family History  Problem Relation Age of Onset   Alzheimer's disease Mother    Heart disease Mother    Stroke Mother    Blindness Mother    Alcohol abuse Father    Diabetes Brother    Healthy Daughter    Breast cancer Maternal Aunt     SOCIAL HISTORY: Social History   Socioeconomic History   Marital status: Divorced    Spouse name: Not on file   Number of children: 1   Years of education: 10   Highest education level: 10th grade  Occupational History   Occupation: Retired    Comment: Designer, fashion/clothing  Tobacco Use   Smoking status: Former    Packs/day: 0.25    Years: 5.00    Pack years: 1.25    Types: Cigarettes    Quit date: 10/09/1974    Years since quitting: 46.2   Smokeless tobacco: Never  Vaping Use   Vaping Use: Never used  Substance and Sexual Activity   Alcohol use: Yes    Alcohol/week: 2.0 standard drinks    Types: 2 Glasses of wine per week   Drug use: No   Sexual activity: Yes    Birth control/protection: Surgical  Other Topics Concern   Not on file  Social History Narrative   Lives alone   One dog    Her daughter lives nearby and checks on her often   Social Determinants of Health   Financial Resource Strain: Low Risk    Difficulty of Paying Living Expenses: Not hard at all  Food Insecurity: No Food Insecurity   Worried About Programme researcher, broadcasting/film/video in the Last Year: Never true   Barista in the Last Year: Never true   Transportation Needs: No Transportation Needs   Lack of Transportation (Medical): No   Lack of Transportation (Non-Medical): No  Physical Activity: Insufficiently Active   Days of Exercise per Week: 2 days   Minutes of Exercise per Session: 20 min  Stress: Stress Concern Present   Feeling of Stress : To some extent  Social Connections: Socially Isolated   Frequency of Communication with Friends and Family: Three times a week   Frequency of Social Gatherings with  Friends and Family: Once a week   Attends Religious Services: Never   Database administrator or Organizations: No   Attends Engineer, structural: Never   Marital Status: Divorced  Catering manager Violence: Not At Risk   Fear of Current or Ex-Partner: No   Emotionally Abused: No   Physically Abused: No   Sexually Abused: No     PHYSICAL EXAM  GENERAL EXAM/CONSTITUTIONAL: Vitals:  Vitals:   01/05/21 1254  BP: (!) 152/70  Pulse: (!) 57  Weight: 139 lb (63 kg)  Height: 5\' 5"  (1.651 m)   Body mass index is 23.13 kg/m. Wt Readings from Last 3 Encounters:  01/05/21 139 lb (63 kg)  11/12/20 139 lb (63 kg)  09/22/20 135 lb (61.2 kg)   Patient is in no distress; well developed, nourished and groomed; neck is supple  CARDIOVASCULAR: Examination of carotid arteries is normal; no carotid bruits Regular rate and rhythm, no murmurs Examination of peripheral vascular system by observation and palpation is normal  EYES: Pupils round and reactive to light, Visual fields full to confrontation, Extraocular movements intacts,   MUSCULOSKELETAL: Gait, strength, tone, movements noted in Neurologic exam below  NEUROLOGIC: MENTAL STATUS:  MMSE - Mini Mental State Exam 06/06/2017  Orientation to time 5  Orientation to Place 5  Registration 3  Attention/ Calculation 5  Recall 3  Language- name 2 objects 2  Language- repeat 1  Language- follow 3 step command 2  Language- read & follow direction 1  Write a  sentence 1  Copy design 0  Total score 28   awake, alert, oriented to person, place and time recent and remote memory intact normal attention and concentration language fluent, comprehension intact, naming intact fund of knowledge appropriate  CRANIAL NERVE:  2nd, 3rd, 4th, 6th - pupils equal and reactive to light, extraocular muscles intact, no nystagmus, there is left visual field cut 5th - facial sensation symmetric 7th - facial strength symmetric 8th - hearing intact 9th - palate elevates symmetrically, uvula midline 11th - shoulder shrug symmetric 12th - tongue protrusion midline  MOTOR:  normal bulk and tone, full strength in the BUE, BLE  SENSORY:  normal and symmetric to light touch, pinprick, temperature, vibration  COORDINATION:  finger-nose-finger, fine finger movements normal  REFLEXES:  deep tendon reflexes present and symmetric  GAIT/STATION:  Wide base gait, unsteady      DIAGNOSTIC DATA (LABS, IMAGING, TESTING) - I reviewed patient records, labs, notes, testing and imaging myself where available.  Lab Results  Component Value Date   WBC 6.8 08/26/2020   HGB 12.3 08/26/2020   HCT 35.2 08/26/2020   MCV 96 08/26/2020   PLT 299 08/26/2020      Component Value Date/Time   NA 138 08/26/2020 1445   K 4.4 08/26/2020 1445   CL 99 08/26/2020 1445   CO2 24 08/26/2020 1445   GLUCOSE 104 (H) 08/26/2020 1445   GLUCOSE 120 (H) 12/22/2018 1942   BUN 21 08/26/2020 1445   CREATININE 1.21 (H) 08/26/2020 1445   CALCIUM 8.8 08/26/2020 1445   PROT 6.7 08/26/2020 1445   ALBUMIN 4.2 08/26/2020 1445   AST 18 08/26/2020 1445   ALT 16 08/26/2020 1445   ALKPHOS 105 08/26/2020 1445   BILITOT <0.2 08/26/2020 1445   GFRNONAA 45 (L) 02/25/2020 1039   GFRAA 52 (L) 02/25/2020 1039   Lab Results  Component Value Date   CHOL 183 08/26/2020   HDL 101 08/26/2020   LDLCALC 71  08/26/2020   LDLDIRECT 165 (H) 07/11/2017   TRIG 56 08/26/2020   CHOLHDL 1.8 08/26/2020    Lab Results  Component Value Date   HGBA1C 5.3 09/22/2018   Lab Results  Component Value Date   VITAMINB12 >2000 (H) 09/09/2016   Lab Results  Component Value Date   TSH 1.410 07/11/2017     ASSESSMENT AND PLAN  80 y.o. year old female with past medical history of right posterior cerebral artery territory stroke with residual left visual field cut, hypertension on 2 blood pressure medications, aortic stenosis, bilateral carotid stenosis who is presenting with dizziness.  Patient reported dizziness has been present for more than 2 years.  She initially presented in April 2020 for worsening dizziness and was found to have a right PCA stroke.  Since then her dizziness has been worse.  Looking back at the history it seems like her dizziness is multifactorial with first her right PCA stroke causing a dense left visual field cut.  On exam today she have evidence of a left homonymous hemianopsia.  On top of that she does have bilateral carotid stenosis, aortic stenosis and hypertension on 2 anti-hypertensive medications.  She is on meclizine 25 mg which she is only taking it once per day with some minimal relief.  I advised her to take the medication when she is symptomatic up to 3 times per day, to use compression stocking and also to use a walker.  She is reluctant to use a walker but I advised her to use it because of her dizziness has caused her to fall and most likely if she does not get any improvement from the dizziness she will likely fall again and will injury herself.  Again I strongly stressed the need to use a walker.  I will also obtain a brain MRI to rule out new stroke or new intracranial pathology that can explain her worsening dizziness.  We will see her in 3 months for follow-up.    1. Status post CVA   2. Left homonymous hemianopsia   3. Dizziness   4. Bilateral carotid artery stenosis   5. Aortic valve stenosis, etiology of cardiac valve disease unspecified        PLAN:  Continue with meclizine as needed for dizziness  MRI Brain to evaluate for additional strokes that can account for the dizziness  Use of compression stockings  Recommend use of walker (you have lost part of your vision on the left side)  Return to clinic in 6 months or sooner if worse. Will contact you after completion of Brain MRI.    Orders Placed This Encounter  Procedures   MR BRAIN WO CONTRAST    No orders of the defined types were placed in this encounter.   Return in about 6 months (around 07/08/2021).    Windell Norfolk, MD 01/05/2021, 5:09 PM  [facility] [address] [place], [place] [address] [phone]

## 2021-01-05 NOTE — Telephone Encounter (Signed)
[employer] medicare order sent to [facility]. No auth they will reach out to the patient to schedule.  

## 2021-01-13 ENCOUNTER — Ambulatory Visit
Admission: RE | Admit: 2021-01-13 | Discharge: 2021-01-13 | Disposition: A | Discharge status: Home / Self Care | Payer: Medicare Other | Source: Ambulatory Visit | Attending: Neurology | Admitting: Neurology

## 2021-01-13 DIAGNOSIS — Z8673 Personal history of transient ischemic attack (TIA), and cerebral infarction without residual deficits: Secondary | ICD-10-CM

## 2021-01-13 DIAGNOSIS — R42 Dizziness and giddiness: Secondary | ICD-10-CM

## 2021-01-13 DIAGNOSIS — H53462 Homonymous bilateral field defects, left side: Secondary | ICD-10-CM

## 2021-01-27 ENCOUNTER — Ambulatory Visit (INDEPENDENT_AMBULATORY_CARE_PROVIDER_SITE_OTHER): Payer: Medicare Other | Admitting: Licensed Clinical Social Worker

## 2021-01-27 DIAGNOSIS — R483 Visual agnosia: Secondary | ICD-10-CM

## 2021-01-27 DIAGNOSIS — R0609 Other forms of dyspnea: Secondary | ICD-10-CM

## 2021-01-27 DIAGNOSIS — M19041 Primary osteoarthritis, right hand: Secondary | ICD-10-CM | POA: Diagnosis not present

## 2021-01-27 DIAGNOSIS — E785 Hyperlipidemia, unspecified: Secondary | ICD-10-CM

## 2021-01-27 DIAGNOSIS — I10 Essential (primary) hypertension: Secondary | ICD-10-CM | POA: Diagnosis not present

## 2021-01-27 DIAGNOSIS — M19042 Primary osteoarthritis, left hand: Secondary | ICD-10-CM

## 2021-01-27 DIAGNOSIS — F32A Depression, unspecified: Secondary | ICD-10-CM

## 2021-01-27 DIAGNOSIS — R0600 Dyspnea, unspecified: Secondary | ICD-10-CM

## 2021-01-27 DIAGNOSIS — I63531 Cerebral infarction due to unspecified occlusion or stenosis of right posterior cerebral artery: Secondary | ICD-10-CM

## 2021-01-27 NOTE — Chronic Care Management (AMB) (Signed)
Chronic Care Management    Clinical Social Work Note  01/27/2021 Name: Pamela Hughes MRN: [id number] DOB: 1940/10/02  Pamela Hughes is a 80 y.o. year old female who is a primary care patient of Pamela Hughes, Pamela Radon, MD. The CCM team was consulted to assist the patient with chronic disease management and/or care coordination needs related to: [name] .   Engaged with patient by telephone for follow up visit in response to provider referral for social work chronic care management and care coordination services.   Consent to Services:  The patient was given information about Chronic Care Management services, agreed to services, and gave verbal consent prior to initiation of services.  Please see initial visit note for detailed documentation.   Patient agreed to services and consent obtained.   Assessment: Review of patient past medical history, allergies, medications, and health status, including review of relevant consultants reports was performed today as part of a comprehensive evaluation and provision of chronic care management and care coordination services.     SDOH (Social Determinants of Health) assessments and interventions performed:  SDOH Interventions    Flowsheet Row Most Recent Value  SDOH Interventions   Stress Interventions Provide Counseling  [stress issues in managing episodes of dizziness. risk for falls. stress in managing general medical contions]  Social Connections Interventions Other (Comment)  [client has reduced family support. Social interaction with cousin, Pamela Hughes who assists her with routine home needs. She has little other family support]  Depression Interventions/Treatment  Currently on Treatment        Advanced Directives Status: See Vynca application for related entries.  CCM Care Plan  No Known Allergies  Outpatient Encounter Medications as of 01/27/2021  Medication Sig   albuterol (VENTOLIN HFA) 108 (90 Base) MCG/ACT inhaler USE 2  PUFFS EVERY 6 HOURS AS NEEDED   amLODipine (NORVASC) 2.5 MG tablet Take 1 tablet (2.5 mg total) by mouth daily.   buPROPion (WELLBUTRIN SR) 150 MG 12 hr tablet Take 1 tablet (150 mg total) by mouth every morning.   hydrOXYzine (ATARAX/VISTARIL) 25 MG tablet TAKE ONE TABLET 3 TIMES A DAY AS NEEDED.   ipratropium (ATROVENT) 0.03 % nasal spray USE TWICE DAILY AS DIRECTED   loratadine (CLARITIN) 10 MG tablet Take 1 tablet (10 mg total) by mouth daily. For itching   meclizine (ANTIVERT) 25 MG tablet TAKE ONE TABLET 3 TIMES A DAY AS NEEDED.   metoprolol succinate (TOPROL-XL) 25 MG 24 hr tablet Take 1 tablet (25 mg total) by mouth daily.   omeprazole (PRILOSEC) 20 MG capsule Take 1 capsule (20 mg total) by mouth daily.   rosuvastatin (CRESTOR) 10 MG tablet Take 1 tablet (10 mg total) by mouth daily.   triamcinolone cream (KENALOG) 0.1 % APPLY TWICE DAILY   No facility-administered encounter medications on file as of 01/27/2021.    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   Left foot pain  01/31/2015   Allergic rhinitis 01/31/2015   Hypertension    Depression    Alopecia    Arthritis     Conditions to be addressed/monitored: monitor client management of food needs faced; monitor client management of episodes of dizziness faced  Care Plan : LCSW care plan  Updates made by Pamela Blakes, LCSW since 01/27/2021 12:00 AM      Problem: Depression Identification (Depression)      Goal: Depressive Symptoms Identified;Manage depression symptoms faced   Start Date: 01/27/2021  Expected End Date: 04/27/2021  This Visit's Progress: On track  Recent Progress: On track  Priority: Medium  Note:    Current Barriers:  Food needs Mobility issues Decreased family support   Social Work Clinical Goal(s):  Over the next 30 days, patient will communicate with LCSW to discuss food needs of client Over the next 30 days, patient will keep all scheduled medical appointments Over next 30 days client will communicate as needed with RNCM to discuss nursing needs of client   Interventions:  1:1 collaboration with Dr. Elige Hughes Dettinger MD regarding development and update of comprehensive plan of care as evidenced by provider attestation and co-signature LCSW discussed with client dizziness of client  Talked with client about her weakness and fatigue occasionally LCSW talked with client about ambulation of client (she has a cane and a walker to use for ambulation assistance.  At risk for falls) Talked with client about her occasional shortness of breath Talked with client about sleeping issues of client Encouraged client to call RNCM as needed for nursing support Talked with client about medication procurement Talked with client about family support (she has support from her cousin, Pamela Hughes) Talked with client about transport needs of client Talked with client about vision of client Talked with client about appetite of client Talked with client about food provision of client (she said her cousin, Pamela Hughes, gives her a ride to grocery store and back home in order for her to obtain food items needed) Talked with client about her support through [employer] food delivery (she said she receives frozen meals monthly (10 meals per month) from [employer] program) Talked with client about food support through  [facility] healthy choice program. She said that the financial support she received from [facility] Healthy Choice program is very helpful to her Taked with client about fall history of client Talked with client about access to her home (has some steps at front of her home) Talked with client about DME of client (has a 3 in 1 bedside commode and has a walker) Talked with client about pain issues of client  talked with client about client completion of ADLs Talked with client about relaxation techniques (likes to do word search puzzles) Talked with client about energy level of client   Patient Self Care Activities:  Performs ADL's independently Unable to drive but her cousin assists with transportation  Patient Deficits Some mobility challenges Food issues   Patient Goals: Patient will call LCSW or RNCM as needed in next 30 days for CCM support Patient will attend all scheduled medical appointments in next 30 days Patient will allow time for rest and relaxation in next 30 days  Follow Up Plan:  LCSW to call client on 03/11/21 to assess client needs at that time        [name].Alaa Eyerman MSW, LCSW Licensed Clinical Social Worker [facility] Care Management [phone]

## 2021-01-27 NOTE — Patient Instructions (Signed)
Visit Information  PATIENT GOALS:  Goals Addressed             This Visit's Progress    Protect My Health;Manage Depression symptoms       Timeframe:  Short-Term Goal Priority:  Medium Progress: On Track Start Date:             01/27/21                Expected End Date:        04/27/21           Follow Up Date 03/11/21  Protect My Health (Patient) Manage Depression symptoms    Why is this important?   Screening tests can find diseases early when they are easier to treat.  Your doctor or nurse will talk with you about which tests are important for you.  Getting shots for common diseases like the flu and shingles will help prevent them.     Patient Self Care Activities:  Performs ADL's independently Unable to drive but her cousin assists with transportation  Patient Deficits Some mobility challenges Food issues   Patient Goals: Patient will call LCSW or RNCM as needed in next 30 days for CCM support Patient will attend all scheduled medical appointments in next 30 days Patient will allow time for rest and relaxation in next 30 days  Follow Up Plan:  LCSW to call client on 03/11/21 to assess client needs     [name].Shandell Giovanni MSW, LCSW Licensed Clinical Social Worker [facility] Care Management [phone]

## 2021-01-29 ENCOUNTER — Other Ambulatory Visit: Payer: Self-pay | Admitting: Family Medicine

## 2021-01-29 DIAGNOSIS — L2089 Other atopic dermatitis: Secondary | ICD-10-CM

## 2021-03-04 ENCOUNTER — Encounter: Payer: Self-pay | Admitting: Family Medicine

## 2021-03-04 ENCOUNTER — Ambulatory Visit (INDEPENDENT_AMBULATORY_CARE_PROVIDER_SITE_OTHER): Payer: Medicare Other

## 2021-03-04 ENCOUNTER — Other Ambulatory Visit: Payer: Self-pay | Admitting: Family Medicine

## 2021-03-04 ENCOUNTER — Ambulatory Visit (INDEPENDENT_AMBULATORY_CARE_PROVIDER_SITE_OTHER): Payer: Medicare Other | Admitting: Family Medicine

## 2021-03-04 ENCOUNTER — Other Ambulatory Visit: Payer: Self-pay

## 2021-03-04 VITALS — BP 176/77 | HR 75 | Ht 65.0 in | Wt 138.0 lb

## 2021-03-04 DIAGNOSIS — Z780 Asymptomatic menopausal state: Secondary | ICD-10-CM | POA: Diagnosis not present

## 2021-03-04 DIAGNOSIS — M85832 Other specified disorders of bone density and structure, left forearm: Secondary | ICD-10-CM | POA: Diagnosis not present

## 2021-03-04 DIAGNOSIS — I10 Essential (primary) hypertension: Secondary | ICD-10-CM | POA: Diagnosis not present

## 2021-03-04 DIAGNOSIS — G4700 Insomnia, unspecified: Secondary | ICD-10-CM

## 2021-03-04 DIAGNOSIS — E785 Hyperlipidemia, unspecified: Secondary | ICD-10-CM | POA: Diagnosis not present

## 2021-03-04 DIAGNOSIS — L2089 Other atopic dermatitis: Secondary | ICD-10-CM | POA: Diagnosis not present

## 2021-03-04 DIAGNOSIS — Z23 Encounter for immunization: Secondary | ICD-10-CM

## 2021-03-04 DIAGNOSIS — F32A Depression, unspecified: Secondary | ICD-10-CM

## 2021-03-04 DIAGNOSIS — M85852 Other specified disorders of bone density and structure, left thigh: Secondary | ICD-10-CM | POA: Diagnosis not present

## 2021-03-04 MED ORDER — TRAZODONE HCL 50 MG PO TABS: 25.0000 mg | ORAL_TABLET | Freq: Every evening | ORAL | 3 refills | Status: DC | PRN

## 2021-03-04 MED ORDER — OMEPRAZOLE 20 MG PO CPDR: 20.0000 mg | DELAYED_RELEASE_CAPSULE | Freq: Every day | ORAL | 3 refills | Status: DC

## 2021-03-04 MED ORDER — METOPROLOL SUCCINATE ER 25 MG PO TB24: 25.0000 mg | ORAL_TABLET | Freq: Every day | ORAL | 3 refills | Status: DC

## 2021-03-04 MED ORDER — ALBUTEROL SULFATE HFA 108 (90 BASE) MCG/ACT IN AERS: INHALATION_SPRAY | RESPIRATORY_TRACT | 3 refills | Status: DC

## 2021-03-04 MED ORDER — IPRATROPIUM BROMIDE 0.03 % NA SOLN: NASAL | 3 refills | Status: DC

## 2021-03-04 MED ORDER — BUPROPION HCL ER (SR) 150 MG PO TB12: 150.0000 mg | ORAL_TABLET | Freq: Every morning | ORAL | 3 refills | Status: DC

## 2021-03-04 MED ORDER — MECLIZINE HCL 25 MG PO TABS: ORAL_TABLET | ORAL | 3 refills | Status: AC

## 2021-03-04 NOTE — Progress Notes (Signed)
BP (!) 176/77   Pulse 75   Ht '5\' 5"'  (1.651 m)   Wt 138 lb (62.6 kg)   SpO2 98%   BMI 22.96 kg/m    Subjective:   Patient ID: Pamela Hughes, female    DOB: 30-Mar-1941, 80 y.o.   MRN: [id number]  HPI: Pamela Hughes is a 80 y.o. female presenting on 03/04/2021 for Medical Management of Chronic Issues, Hypertension, and Gastroesophageal Reflux   HPI Depression recheck Patient is coming for depression recheck.  She currently uses Wellbutrin and hydroxyzine as needed.  She feels like her depression is doing well but her sleeping is not doing as well.  She says that hydroxyzine is not helping with her sleeping and she would like to try something different. Depression screen [facility] 2/9 01/27/2021 12/18/2020 11/05/2020 09/26/2020 09/22/2020  Decreased Interest '1 1 1 1 1  ' Down, Depressed, Hopeless '2 2 2 2 2  ' PHQ - 2 Score '3 3 3 3 3  ' Altered sleeping '1 2 2 2 2  ' Tired, decreased energy '2 1 1 1 1  ' Change in appetite 0 0 0 0 0  Feeling bad or failure about yourself  0 0 0 0 0  Trouble concentrating 0 0 0 0 0  Moving slowly or fidgety/restless '1 1 1 1 ' 0  Suicidal thoughts 0 0 0 0 0  PHQ-9 Score '7 7 7 7 6  ' Difficult doing work/chores Somewhat difficult Somewhat difficult Somewhat difficult Somewhat difficult Somewhat difficult  Some recent data might be hidden     Hypertension Patient is currently on amlodipine and metoprolol, and their blood pressure today is 151/68. Patient denies any lightheadedness or dizziness. Patient denies headaches, blurred vision, chest pains, shortness of breath, or weakness. Denies any side effects from medication and is content with current medication.   Hyperlipidemia Patient is coming in for recheck of his hyperlipidemia. The patient is currently taking Crestor. They deny any issues with myalgias or history of liver damage from it. They deny any focal numbness or weakness or chest pain.   Patient has persistent dizziness that is likely due to her stroke that she had  in the past.  It is no better or no worse but is the same.  The meclizine does help some  Relevant past medical, surgical, family and social history reviewed and updated as indicated. Interim medical history since our last visit reviewed. Allergies and medications reviewed and updated.  Review of Systems  Constitutional:  Negative for chills and fever.  Eyes:  Negative for visual disturbance.  Respiratory:  Negative for chest tightness and shortness of breath.   Cardiovascular:  Negative for chest pain and leg swelling.  Musculoskeletal:  Negative for back pain and gait problem.  Skin:  Negative for rash.  Neurological:  Positive for dizziness. Negative for light-headedness and headaches.  Psychiatric/Behavioral:  Positive for sleep disturbance. Negative for agitation, behavioral problems and dysphoric mood. The patient is not nervous/anxious.   All other systems reviewed and are negative.  Per HPI unless specifically indicated above   Allergies as of 03/04/2021   No Known Allergies      Medication List        Accurate as of March 04, 2021  1:19 PM. If you have any questions, ask your nurse or doctor.          STOP taking these medications    hydrOXYzine 25 MG tablet Commonly known as: ATARAX/VISTARIL Stopped by: Worthy Rancher, MD   loratadine  10 MG tablet Commonly known as: CLARITIN Stopped by: Worthy Rancher, MD       TAKE these medications    albuterol 108 (90 Base) MCG/ACT inhaler Commonly known as: VENTOLIN HFA USE 2 PUFFS EVERY 6 HOURS AS NEEDED   amLODipine 2.5 MG tablet Commonly known as: NORVASC Take 1 tablet (2.5 mg total) by mouth daily.   buPROPion 150 MG 12 hr tablet Commonly known as: WELLBUTRIN SR Take 1 tablet (150 mg total) by mouth every morning.   ipratropium 0.03 % nasal spray Commonly known as: ATROVENT USE TWICE DAILY AS DIRECTED What changed:  when to take this additional instructions Changed by: Worthy Rancher,  MD   meclizine 25 MG tablet Commonly known as: ANTIVERT TAKE ONE TABLET 3 TIMES A DAY AS NEEDED.   metoprolol succinate 25 MG 24 hr tablet Commonly known as: TOPROL-XL Take 1 tablet (25 mg total) by mouth daily.   omeprazole 20 MG capsule Commonly known as: PRILOSEC Take 1 capsule (20 mg total) by mouth daily.   rosuvastatin 10 MG tablet Commonly known as: CRESTOR Take 1 tablet (10 mg total) by mouth daily.   traZODone 50 MG tablet Commonly known as: DESYREL Take 0.5-1 tablets (25-50 mg total) by mouth at bedtime as needed for sleep. Started by: Fransisca Kaufmann Majestic Brister, MD   triamcinolone cream 0.1 % Commonly known as: KENALOG APPLY TWICE DAILY         Objective:   BP (!) 176/77   Pulse 75   Ht '5\' 5"'  (1.651 m)   Wt 138 lb (62.6 kg)   SpO2 98%   BMI 22.96 kg/m   Wt Readings from Last 3 Encounters:  03/04/21 138 lb (62.6 kg)  01/05/21 139 lb (63 kg)  11/12/20 139 lb (63 kg)    Physical Exam Vitals and nursing note reviewed.  Constitutional:      General: She is not in acute distress.    Appearance: She is well-developed. She is not diaphoretic.  Eyes:     Conjunctiva/sclera: Conjunctivae normal.  Cardiovascular:     Rate and Rhythm: Normal rate and regular rhythm.     Heart sounds: Normal heart sounds. No murmur heard. Pulmonary:     Effort: Pulmonary effort is normal. No respiratory distress.     Breath sounds: Normal breath sounds. No wheezing.  Musculoskeletal:        General: No swelling or tenderness. Normal range of motion.  Skin:    General: Skin is warm and dry.     Findings: No rash.  Neurological:     Mental Status: She is alert and oriented to person, place, and time.     Coordination: Coordination normal.  Psychiatric:        Behavior: Behavior normal.      Assessment & Plan:   Problem List Items Addressed This Visit       Cardiovascular and Mediastinum   Hypertension - Primary   Relevant Medications   metoprolol succinate  (TOPROL-XL) 25 MG 24 hr tablet   Other Relevant Orders   CBC with Differential/Platelet   CMP14+EGFR   Lipid panel     Other   Depression   Relevant Medications   buPROPion (WELLBUTRIN SR) 150 MG 12 hr tablet   traZODone (DESYREL) 50 MG tablet   Other Relevant Orders   CBC with Differential/Platelet   Dyslipidemia (high LDL; low HDL)   Relevant Orders   Lipid panel   Other Visit Diagnoses     Other  atopic dermatitis       Relevant Orders   CBC with Differential/Platelet   Postmenopausal       Relevant Orders   DG [facility] DEXA   Insomnia, unspecified type       Relevant Medications   traZODone (DESYREL) 50 MG tablet       Will try trazodone instead of hydroxyzine to help with sleep.  No other change in medication.  Seems like she is doing well other than the sleep aspect. Blood pressure is mildly elevated, we are allowing permissive hypertension although she is slightly higher than what I would like but not going to change because of dizziness already, monitor closely at home. Follow up plan: Return in about 6 months (around 09/02/2021), or if symptoms worsen or fail to improve, for Hypertension depression and cholesterol.  Counseling provided for all of the vaccine components Orders Placed This Encounter  Procedures   DG [facility] DEXA   CBC with Differential/Platelet   CMP14+EGFR   Lipid panel    Caryl Pina, MD [facility] 03/04/2021, 1:19 PM

## 2021-03-05 LAB — CMP14+EGFR
ALT: 25 IU/L (ref 0–32)
AST: 21 IU/L (ref 0–40)
Albumin/Globulin Ratio: 1.7 (ref 1.2–2.2)
Albumin: 4 g/dL (ref 3.7–4.7)
Alkaline Phosphatase: 104 IU/L (ref 44–121)
BUN/Creatinine Ratio: 15 (ref 12–28)
BUN: 20 mg/dL (ref 8–27)
Bilirubin Total: <0.2 mg/dL (ref 0.0–1.2)
CO2: 25 mmol/L (ref 20–29)
Calcium: 9.3 mg/dL (ref 8.7–10.3)
Chloride: 100 mmol/L (ref 96–106)
Creatinine, Ser: 1.31 mg/dL — ABNORMAL HIGH (ref 0.57–1.00)
Globulin, Total: 2.4 g/dL (ref 1.5–4.5)
Glucose: 124 mg/dL — ABNORMAL HIGH (ref 70–99)
Potassium: 5 mmol/L (ref 3.5–5.2)
Sodium: 138 mmol/L (ref 134–144)
Total Protein: 6.4 g/dL (ref 6.0–8.5)
eGFR: 41 mL/min/{1.73_m2} — ABNORMAL LOW (ref >59)

## 2021-03-05 LAB — CBC WITH DIFFERENTIAL/PLATELET
Basophils Absolute: 0.1 10*3/uL (ref 0.0–0.2)
Basos: 1 %
EOS (ABSOLUTE): 0.2 10*3/uL (ref 0.0–0.4)
Eos: 2 %
Hematocrit: 35.7 % (ref 34.0–46.6)
Hemoglobin: 12.5 g/dL (ref 11.1–15.9)
Immature Grans (Abs): 0 10*3/uL (ref 0.0–0.1)
Immature Granulocytes: 0 %
Lymphocytes Absolute: 1.8 10*3/uL (ref 0.7–3.1)
Lymphs: 24 %
MCH: 32.7 pg (ref 26.6–33.0)
MCHC: 35 g/dL (ref 31.5–35.7)
MCV: 94 fL (ref 79–97)
Monocytes Absolute: 0.9 10*3/uL (ref 0.1–0.9)
Monocytes: 12 %
Neutrophils Absolute: 4.5 10*3/uL (ref 1.4–7.0)
Neutrophils: 61 %
Platelets: 362 10*3/uL (ref 150–450)
RBC: 3.82 x10E6/uL (ref 3.77–5.28)
RDW: 11.7 % (ref 11.7–15.4)
WBC: 7.4 10*3/uL (ref 3.4–10.8)

## 2021-03-05 LAB — LIPID PANEL
Chol/HDL Ratio: 2.1 ratio (ref 0.0–4.4)
Cholesterol, Total: 180 mg/dL (ref 100–199)
HDL: 86 mg/dL (ref >39)
LDL Chol Calc (NIH): 77 mg/dL (ref 0–99)
Triglycerides: 99 mg/dL (ref 0–149)
VLDL Cholesterol Cal: 17 mg/dL (ref 5–40)

## 2021-03-11 ENCOUNTER — Telehealth: Payer: Medicare Other

## 2021-04-30 ENCOUNTER — Ambulatory Visit (INDEPENDENT_AMBULATORY_CARE_PROVIDER_SITE_OTHER): Payer: Medicare Other | Admitting: Licensed Clinical Social Worker

## 2021-04-30 DIAGNOSIS — F32A Depression, unspecified: Secondary | ICD-10-CM

## 2021-04-30 DIAGNOSIS — M19041 Primary osteoarthritis, right hand: Secondary | ICD-10-CM

## 2021-04-30 DIAGNOSIS — R483 Visual agnosia: Secondary | ICD-10-CM

## 2021-04-30 DIAGNOSIS — I10 Essential (primary) hypertension: Secondary | ICD-10-CM

## 2021-04-30 DIAGNOSIS — R0609 Other forms of dyspnea: Secondary | ICD-10-CM

## 2021-04-30 DIAGNOSIS — Z8673 Personal history of transient ischemic attack (TIA), and cerebral infarction without residual deficits: Secondary | ICD-10-CM

## 2021-04-30 DIAGNOSIS — E785 Hyperlipidemia, unspecified: Secondary | ICD-10-CM

## 2021-04-30 DIAGNOSIS — F331 Major depressive disorder, recurrent, moderate: Secondary | ICD-10-CM

## 2021-04-30 DIAGNOSIS — M19042 Primary osteoarthritis, left hand: Secondary | ICD-10-CM

## 2021-04-30 NOTE — Chronic Care Management (AMB) (Signed)
Chronic Care Management    Clinical Social Work Note  04/30/2021 Name: Pamela Hughes MRN: [id number] DOB: December 17, 1940  Pamela Hughes is a 80 y.o. year old female who is a primary care patient of Hughes, Pamela Kaufmann, MD. The CCM team was consulted to assist the patient with chronic disease management and/or care coordination needs related to: [employer] .   Engaged with patient by telephone for follow up visit in response to provider referral for social work chronic care management and care coordination services.   Consent to Services:  The patient was given information about Chronic Care Management services, agreed to services, and gave verbal consent prior to initiation of services.  Please see initial visit note for detailed documentation.   Patient agreed to services and consent obtained.   Assessment: Review of patient past medical history, allergies, medications, and health status, including review of relevant consultants reports was performed today as part of a comprehensive evaluation and provision of chronic care management and care coordination services.     SDOH (Social Determinants of Health) assessments and interventions performed:  SDOH Interventions    Flowsheet Row Most Recent Value  SDOH Interventions   Stress Interventions Provide Counseling  [client has stress related to managing medical needs]  Social Connections Interventions Other (Comment)  [client has social interactions with Pamela Hughes, her cousin, who helps her with grocery procurement or helps her complete errands]  Depression Interventions/Treatment  Currently on Treatment        Advanced Directives Status: See Vynca application for related entries.  CCM Care Plan  No Known Allergies  Outpatient Encounter Medications as of 04/30/2021  Medication Sig   albuterol (VENTOLIN HFA) 108 (90 Base) MCG/ACT inhaler USE 2 PUFFS EVERY 6 HOURS AS NEEDED   amLODipine (NORVASC) 2.5 MG tablet Take 1 tablet  (2.5 mg total) by mouth daily.   buPROPion (WELLBUTRIN SR) 150 MG 12 hr tablet Take 1 tablet (150 mg total) by mouth every morning.   ipratropium (ATROVENT) 0.03 % nasal spray USE TWICE DAILY AS DIRECTED   meclizine (ANTIVERT) 25 MG tablet TAKE ONE TABLET 3 TIMES A DAY AS NEEDED.   metoprolol succinate (TOPROL-XL) 25 MG 24 hr tablet Take 1 tablet (25 mg total) by mouth daily.   omeprazole (PRILOSEC) 20 MG capsule Take 1 capsule (20 mg total) by mouth daily.   rosuvastatin (CRESTOR) 10 MG tablet Take 1 tablet (10 mg total) by mouth daily.   traZODone (DESYREL) 50 MG tablet Take 0.5-1 tablets (25-50 mg total) by mouth at bedtime as needed for sleep.   triamcinolone cream (KENALOG) 0.1 % APPLY TWICE DAILY   No facility-administered encounter medications on file as of 04/30/2021.    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 Monitor depression issues of client  Care Plan : LCSW care plan  Updates made by Katha Cabal, LCSW since 04/30/2021 12:00 AM     Problem: Depression Identification (Depression)  Goal: Depressive Symptoms Identified;Manage depression symptoms faced   Start Date: 04/30/2021  Expected End Date: 07/27/2021  This Visit's Progress: On track  Recent Progress: On track  Priority: Medium  Note:    Current Barriers:  Food needs Mobility issues Decreased family support   Social Work Clinical Goal(s):  Over the next 30 days, patient will communicate with LCSW to discuss food  needs of client Over the next 30 days, patient will keep all scheduled medical appointments Over next 30 days client will communicate as needed with RNCM to discuss nursing needs of client   Interventions:  1:1 collaboration with Dr. Elige Radon Dettinger MD regarding development and update of comprehensive plan of care as evidenced by provider attestation and co-signature LCSW discussed with client dizziness of client  Discussed energy level of client. She said she is often tired or often feels fatigued. Discussed ambulation of client . Reviewed sleeping issues of client.  Client said she has reduced sleep.   Reviewed food procurement with client. She said she receives [employer] Whe[employer] frozen meals monthly. These frozen meals are helpful to client. She also said she gets [facility] Healthy Choice benefit each month Encouraged client to call RNCM as needed for nursing support Reviewed medication procurement with client. Reviewed pain issues of client. Reviewed family support.  Client said she has reduced family support. She does get help periodically from her cousin, Pamela Hughes. Reviewed vision needs of client and transport needs of client Reviewed relaxation techniques (likes to do word search puzzles) Provided counseling support for client   Patient Self Care Activities:  Performs ADL's independently Unable to drive but her cousin assists with transportation  Patient Deficits Some mobility challenges Food issues   Patient Goals: Patient will call LCSW or RNCM as needed in next 30 days for CCM support Patient will attend all scheduled medical appointments in next 30 days Patient will allow time for rest and relaxation in next 30 days  Follow Up Plan:  LCSW to call client on  06/30/21 at 10:00 AM to assess client needs at that time        [name].Jakim Drapeau MSW, LCSW Licensed Clinical Social Worker [facility] Care Management [phone]

## 2021-04-30 NOTE — Patient Instructions (Addendum)
Visit Information  Patient Goals:  Protect My Health (Patient) Manage Depression symptoms  Timeframe:  Short-Term Goal Priority:  Medium Progress: On Track Start Date:            04/30/21                Expected End Date:        07/27/21           Follow Up Date 06/30/21 at 10:00  AM  Protect My Health (Patient) Manage Depression symptoms    Why is this important?   Screening tests can find diseases early when they are easier to treat.  Your doctor or nurse will talk with you about which tests are important for you.  Getting shots for common diseases like the flu and shingles will help prevent them.     Patient Self Care Activities:  Performs ADL's independently Unable to drive but her cousin assists with transportation  Patient Deficits Some mobility challenges Food issues   Patient Goals: Patient will call LCSW or RNCM as needed in next 30 days for CCM support Patient will attend all scheduled medical appointments in next 30 days Patient will allow time for rest and relaxation in next 30 days  Follow Up Plan:  LCSW to call client on 06/30/21 at 10:00 AM to assess client needs   Pamela Hughes MSW, LCSW Licensed Clinical Social Worker [facility] Care Management [phone]

## 2021-05-18 ENCOUNTER — Other Ambulatory Visit: Payer: Self-pay

## 2021-05-18 ENCOUNTER — Other Ambulatory Visit: Payer: Self-pay | Admitting: Nurse Practitioner

## 2021-05-18 ENCOUNTER — Other Ambulatory Visit: Payer: Self-pay | Admitting: Family Medicine

## 2021-05-18 NOTE — Telephone Encounter (Signed)
Last office visit 03/04/21 Last refill 12/30/20, #454 grams, no refills

## 2021-05-30 DIAGNOSIS — M19041 Primary osteoarthritis, right hand: Secondary | ICD-10-CM

## 2021-05-30 DIAGNOSIS — I10 Essential (primary) hypertension: Secondary | ICD-10-CM

## 2021-05-30 DIAGNOSIS — F32A Depression, unspecified: Secondary | ICD-10-CM

## 2021-05-30 DIAGNOSIS — F331 Major depressive disorder, recurrent, moderate: Secondary | ICD-10-CM

## 2021-05-30 DIAGNOSIS — E785 Hyperlipidemia, unspecified: Secondary | ICD-10-CM

## 2021-05-30 DIAGNOSIS — M19042 Primary osteoarthritis, left hand: Secondary | ICD-10-CM

## 2021-06-05 ENCOUNTER — Telehealth: Payer: Self-pay | Admitting: Family Medicine

## 2021-06-05 NOTE — Telephone Encounter (Signed)
[facility] is calling to let Dr. Louanne Skye know that patient has moderate bilateral extremity PAD. Will send report on this.

## 2021-06-08 NOTE — Telephone Encounter (Signed)
Okay thanks, we will discuss this at her next visit and discuss possible treatment plans.  I will watch for the report.

## 2021-06-30 ENCOUNTER — Ambulatory Visit (INDEPENDENT_AMBULATORY_CARE_PROVIDER_SITE_OTHER): Payer: Medicare Other

## 2021-06-30 DIAGNOSIS — I10 Essential (primary) hypertension: Secondary | ICD-10-CM

## 2021-06-30 DIAGNOSIS — E785 Hyperlipidemia, unspecified: Secondary | ICD-10-CM

## 2021-06-30 DIAGNOSIS — R0609 Other forms of dyspnea: Secondary | ICD-10-CM

## 2021-06-30 DIAGNOSIS — R483 Visual agnosia: Secondary | ICD-10-CM

## 2021-06-30 DIAGNOSIS — M19041 Primary osteoarthritis, right hand: Secondary | ICD-10-CM

## 2021-06-30 DIAGNOSIS — Z8673 Personal history of transient ischemic attack (TIA), and cerebral infarction without residual deficits: Secondary | ICD-10-CM

## 2021-06-30 DIAGNOSIS — F32A Depression, unspecified: Secondary | ICD-10-CM

## 2021-06-30 DIAGNOSIS — M19042 Primary osteoarthritis, left hand: Secondary | ICD-10-CM

## 2021-06-30 NOTE — Patient Instructions (Addendum)
Visit Information  Patient goals: Protect My Health (Patient).  Manage Depression symptoms  Timeframe:  Short-Term Goal Priority:  Medium Progress: On Track Start Date:            06/30/21                Expected End Date:        09/22/21           Follow Up Date 08/20/21 at 2:00 PM   Protect My Health (Patient) Manage Depression symptoms    Why is this important?   Screening tests can find diseases early when they are easier to treat.  Your doctor or nurse will talk with you about which tests are important for you.  Getting shots for common diseases like the flu and shingles will help prevent them.     Patient Self Care Activities:  Performs ADL's independently Unable to drive but her cousin assists with transportation  Patient Deficits Some mobility challenges Food issues   Patient Goals: Patient will call LCSW or RNCM as needed in next 30 days for CCM support Patient will attend all scheduled medical appointments in next 30 days Patient will allow time for rest and relaxation in next 30 days  Follow Up Plan:  LCSW to call client on 08/20/21 at 2:00 PM to assess client needs   [name].Kamen Hanken MSW, LCSW Licensed Visual merchandiser [facility] Care Management [phone]

## 2021-06-30 NOTE — Chronic Care Management (AMB) (Signed)
Chronic Care Management    Clinical Social Work Note  06/30/2021 Name: Pamela Hughes MRN: [id number] DOB: 08-Dec-1940  Pamela Hughes is a 81 y.o. year old female who is a primary care patient of Dettinger, Fransisca Kaufmann, MD. The CCM team was consulted to assist the patient with chronic disease management and/or care coordination needs related to: [employer] .   Engaged with patient by telephone for follow up visit in response to provider referral for social work chronic care management and care coordination services.   Consent to Services:  The patient was given information about Chronic Care Management services, agreed to services, and gave verbal consent prior to initiation of services.  Please see initial visit note for detailed documentation.   Patient agreed to services and consent obtained.   Assessment: Review of patient past medical history, allergies, medications, and health status, including review of relevant consultants reports was performed today as part of a comprehensive evaluation and provision of chronic care management and care coordination services.     SDOH (Social Determinants of Health) assessments and interventions performed:  SDOH Interventions    Flowsheet Row Most Recent Value  SDOH Interventions   Physical Activity Interventions Other (Comments)  [client has walking challenges. she uses a cane to help her walk. she fatigues occasionally and has to take rest breaks when walking]  Stress Interventions Provide Counseling  [client has stress related to managing medical needs]  Depression Interventions/Treatment  Currently on Treatment        Advanced Directives Status: See Vynca application for related entries.  CCM Care Plan  No Known Allergies  Outpatient Encounter Medications as of 06/30/2021  Medication Sig   albuterol (VENTOLIN HFA) 108 (90 Base) MCG/ACT inhaler USE 2 PUFFS EVERY 6 HOURS AS NEEDED   amLODipine (NORVASC) 2.5 MG tablet Take 1 tablet  (2.5 mg total) by mouth daily.   buPROPion (WELLBUTRIN SR) 150 MG 12 hr tablet Take 1 tablet (150 mg total) by mouth every morning.   ipratropium (ATROVENT) 0.03 % nasal spray USE TWICE DAILY AS DIRECTED   meclizine (ANTIVERT) 25 MG tablet TAKE ONE TABLET 3 TIMES A DAY AS NEEDED.   metoprolol succinate (TOPROL-XL) 25 MG 24 hr tablet Take 1 tablet (25 mg total) by mouth daily.   omeprazole (PRILOSEC) 20 MG capsule Take 1 capsule (20 mg total) by mouth daily.   rosuvastatin (CRESTOR) 10 MG tablet Take 1 tablet (10 mg total) by mouth daily.   traZODone (DESYREL) 150 MG tablet Take 150 mg by mouth at bedtime.   traZODone (DESYREL) 50 MG tablet Take 0.5-1 tablets (25-50 mg total) by mouth at bedtime as needed for sleep.   triamcinolone cream (KENALOG) 0.1 % APPLY TWICE DAILY   No facility-administered encounter medications on file as of 06/30/2021.    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   Allergic rhinitis 01/31/2015   Hypertension    Depression    Alopecia    Arthritis     Conditions to be addressed/monitored: monitor client management of depression issues  Care Plan : LCSW care plan  Updates made by Katha Cabal, LCSW since 06/30/2021 12:00 AM  Problem: Depression Identification (Depression)      Goal: Depressive Symptoms Identified;Manage depression symptoms faced   Start Date: 06/30/2021  Expected End Date: 09/22/2021  This Visit's Progress: On track  Recent Progress: On track  Priority: Medium  Note:    Current Barriers:  Food needs Mobility issues Decreased family support   Social Work Clinical Goal(s):  Over the next 30 days,  patient will communicate with LCSW to discuss food needs of client Over the next 30 days, patient will communicate with LCSW to discuss depression issues of client Over the next 30 days, patient will keep all scheduled medical appointments Over next 30 days client will communicate as needed with RNCM to discuss nursing needs of client   Interventions:  1:1 collaboration with Dr. Fransisca Kaufmann Dettinger MD regarding development and update of comprehensive plan of care as evidenced by provider attestation and co-signature LCSW discussed with client dizziness of client . She said she does get dizzy occasionally Discussed energy level of client. She said she is often tired or often feels fatigued. Discussed ambulation of client .She said she has a cane to use as needed to help her walk Reviewed sleeping issues of client.  Client said she has reduced sleep.   Reviewed food procurement with client. She said she receives Meals on Wheels frozen meals monthly. These frozen meals are helpful to client. She also said she gets [facility] Healthy Choice benefit each month. She said that this Healthy Choice food benefit each month is very helpful to her Encouraged client to call RNCM as needed for nursing support Reviewed medication procurement with client. Reviewed pain issues of client. Reviewed family support.  Client said she has reduced family support. She does get help from her cousin, Pamela Hughes. He helps her with transport needs, obtaining pharmacy items, helps her obtain groceries. Reviewed vision needs of client. Provided counseling support for client Reviewed mood status of client. She said she does get sad occasionally. But, overall, she feels that her mood is stable.   Patient Self Care Activities:  Performs ADL's independently Unable to drive but her cousin assists with transportation  Patient Deficits Some mobility challenges Food issues   Patient Goals: Patient will call LCSW or RNCM as needed  in next 30 days for CCM support Patient will attend all scheduled medical appointments in next 30 days Patient will allow time for rest and relaxation in next 30 days  Follow Up Plan:  LCSW to call client on  08/20/21 at 2:00 PM to assess client needs at that time         [name].Laiklyn Pilkenton MSW, [place] representative [facility] Care Management [phone]

## 2021-08-07 ENCOUNTER — Other Ambulatory Visit: Payer: Self-pay | Admitting: Family

## 2021-08-20 ENCOUNTER — Ambulatory Visit (INDEPENDENT_AMBULATORY_CARE_PROVIDER_SITE_OTHER): Payer: Medicare Other | Admitting: Licensed Clinical Social Worker

## 2021-08-20 DIAGNOSIS — I10 Essential (primary) hypertension: Secondary | ICD-10-CM

## 2021-08-20 DIAGNOSIS — M19042 Primary osteoarthritis, left hand: Secondary | ICD-10-CM

## 2021-08-20 DIAGNOSIS — R0609 Other forms of dyspnea: Secondary | ICD-10-CM

## 2021-08-20 DIAGNOSIS — F32A Depression, unspecified: Secondary | ICD-10-CM

## 2021-08-20 DIAGNOSIS — Z8673 Personal history of transient ischemic attack (TIA), and cerebral infarction without residual deficits: Secondary | ICD-10-CM

## 2021-08-20 DIAGNOSIS — E785 Hyperlipidemia, unspecified: Secondary | ICD-10-CM

## 2021-08-20 DIAGNOSIS — R483 Visual agnosia: Secondary | ICD-10-CM

## 2021-08-20 NOTE — Patient Instructions (Addendum)
Visit Information ? ?Patient Goals  Protect My Health (Patient).  Manage Depression symptoms ? ?Timeframe:  Short-Term Goal ?Priority:  Medium ?Progress: On Track ?Start Date:            08/20/21                ?Expected End Date:        11/16/21            ? ?Follow Up Date 10/14/21 at 3:00 PM  ? ?Protect My Health (Patient) Manage Depression symptoms  ?  ?Why is this important?   ?Screening tests can find diseases early when they are easier to treat.  ?Your doctor or nurse will talk with you about which tests are important for you.  ?Getting shots for common diseases like the flu and shingles will help prevent them.   ?  ?Patient Self Care Activities:  ?Performs ADL's independently ?Unable to drive but her cousin assists with transportation ? ?Patient Deficits ?Some mobility challenges ?Food issues ?  ?Patient Goals: ?Patient will call LCSW or RNCM as needed in next 30 days for CCM support ?Patient will attend all scheduled medical appointments in next 30 days ?Patient will allow time for rest and relaxation in next 30 days ? ?Follow Up Plan:  LCSW to call client on 10/14/21 at 3:00 PM  to assess client needs  ? ?[name].Bali Lyn MSW, LCSW ?Licensed Clinical Social Worker ?[facility] Care Management ?[phone] ?

## 2021-08-20 NOTE — Chronic Care Management (AMB) (Signed)
?Chronic Care Management  ? ? Clinical Social Work Note ? ?08/20/2021 ?Name: Pamela Hughes MRN: [id number] DOB: 1941/01/26 ? ?Pamela Hughes is a 81 y.o. year old female who is a primary care patient of Dettinger, Elige Radon, MD. The CCM team was consulted to assist the patient with chronic disease management and/or care coordination needs related to: [name] .  ? ?Engaged with patient by telephone for follow up visit in response to provider referral for social work chronic care management and care coordination services.  ? ?Consent to Services:  ?The patient was given information about Chronic Care Management services, agreed to services, and gave verbal consent prior to initiation of services.  Please see initial visit note for detailed documentation.  ? ?Patient agreed to services and consent obtained.  ? ?Assessment: Review of patient past medical history, allergies, medications, and health status, including review of relevant consultants reports was performed today as part of a comprehensive evaluation and provision of chronic care management and care coordination services.    ? ?SDOH (Social Determinants of Health) assessments and interventions performed:  ?SDOH Interventions   ? ?Flowsheet Row Most Recent Value  ?SDOH Interventions   ?Physical Activity Interventions Other (Comments)  [client may fatigue when walking]  ?Stress Interventions Provide Counseling  [client has stress  related to managing dental needs]  ?Depression Interventions/Treatment  Medication, Counseling  ? ?  ?  ? ?Advanced Directives Status: See Vynca application for related entries. ? ?CCM Care Plan ? ?No Known Allergies ? ?Outpatient Encounter Medications as of 08/20/2021  ?Medication Sig  ? albuterol (VENTOLIN HFA) 108 (90 Base) MCG/ACT inhaler USE 2 PUFFS EVERY 6 HOURS AS NEEDED  ? amLODipine (NORVASC) 2.5 MG tablet Take 1 tablet (2.5 mg total) by mouth daily.  ? buPROPion (WELLBUTRIN SR) 150 MG 12 hr tablet Take 1 tablet (150 mg  total) by mouth every morning.  ? ipratropium (ATROVENT) 0.03 % nasal spray USE TWICE DAILY AS DIRECTED  ? meclizine (ANTIVERT) 25 MG tablet TAKE ONE TABLET 3 TIMES A DAY AS NEEDED.  ? metoprolol succinate (TOPROL-XL) 25 MG 24 hr tablet Take 1 tablet (25 mg total) by mouth daily.  ? omeprazole (PRILOSEC) 20 MG capsule Take 1 capsule (20 mg total) by mouth daily.  ? rosuvastatin (CRESTOR) 10 MG tablet Take 1 tablet (10 mg total) by mouth daily.  ? traZODone (DESYREL) 150 MG tablet Take 150 mg by mouth at bedtime.  ? traZODone (DESYREL) 50 MG tablet Take 0.5-1 tablets (25-50 mg total) by mouth at bedtime as needed for sleep.  ? triamcinolone cream (KENALOG) 0.1 % APPLY TWICE DAILY  ? ?No facility-administered encounter medications on file as of 08/20/2021.  ? ? ?Patient Active Problem List  ? Diagnosis Date Noted  ? Aortic valve stenosis 11/11/2020  ? Dyslipidemia (high LDL; low HDL) 09/19/2019  ? 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 Status post CVA 09/21/2018  ? Chronic cough 05/18/2018  ? Collagenous colitis 02/16/2017  ? Family hx of colon cancer 11/11/2016  ? Osteoarthritis of both hands 06/27/2015  ? 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 Allergic rhinitis 01/31/2015  ? Hypertension   ? Depression   ? Alopecia   ? Arthritis   ? ? ?Conditions to be addressed/monitored: monitor food needs of client; monitor depression issues of client ? ?Care Plan : LCSW care plan  ?Updates made by [name], LCSW since 08/20/2021 12:00 AM  ?  ? ?Problem: Depression Identification (Depression)   ?  ? ?Goal:  Depressive Symptoms Identified;Manage depression symptoms faced   ?Start Date: 08/20/2021  ?Expected End Date: 11/16/2021  ?This Visit's Progress: On track  ?Recent Progress: On track  ?Priority: Medium  ?Note:   ? Current Barriers:  ?Food needs ?Mobility issues ?Decreased family support ?Dental needs ?  ?Social Work Clinical Goal(s):  ?Over the next 30 days, patient will communicate with LCSW to discuss food needs of  client ?Over the next 30 days, patient will communicate with LCSW to discuss depression issues of client ?Over the next 30 days, patient will keep all scheduled medical appointments ?Over next 30 days client will communicate as needed with RNCM to discuss nursing needs of client ?  ?Interventions:  ?1:1 collaboration with Dr. Elige Radon Dettinger MD regarding development and update of comprehensive plan of care as evidenced by provider attestation and co-signature ?LCSW discussed with client dizziness of client . She said she does get dizzy occasionally ?Discussed energy level of client. She said she is often tired or often feels fatigued. ?Discussed ambulation of client .She said she has a cane to use as needed to help her walk ?Reviewed sleeping issues of client.  Client said she has reduced sleep.   ?Reviewed food procurement with client. She said she receives Meals on Wheels frozen meals monthly. These frozen meals are helpful to client. She also said she gets [facility] Healthy Choice benefit each month. She said that this Healthy Choice food benefit each month is very helpful to her ?Encouraged client to call RNCM as needed for nursing support ?Reviewed medication procurement with client. Reviewed pain issues of client. ?Reviewed family support.  Client said she has reduced family support. She does get help from her cousin, [name]. He helps her with transport needs, obtaining pharmacy items, helps her obtain groceries. ?Provided counseling support for client ?Reviewed mood status of client. She said she does get sad occasionally. But, overall, she feels that her mood is stable.  ?Reviewed dental needs of client. She said she went to local dentist and talked with dentist about costs of extracting 5 teeth.  Client said she has [employer] and also has Secretary/administrator. LCSW and client spoke of [facility] in [place], [place]. LCSW and client spoke of [facility] in [place], [place].  Client said she was  exploring dental care options at this time ?Discussed cardiology support for client ? ?Patient Self Care Activities:  ?Performs ADL's independently ?Unable to drive but her cousin assists with transportation ? ?Patient Deficits ?Some mobility challenges ?Food issues ?  ?Patient Goals: ?Patient will call LCSW or RNCM as needed in next 30 days for CCM support ?Patient will attend all scheduled medical appointments in next 30 days ?Patient will allow time for rest and relaxation in next 30 days ? ?Follow Up Plan:  LCSW to call client on  10/14/21 at 3:00 PM to assess client needs at that time    ?  ?  ?[name].Geniene List MSW, LCSW ?Licensed Clinical Social Worker ?[facility] Care Management ?[phone] ?

## 2021-08-28 DIAGNOSIS — E785 Hyperlipidemia, unspecified: Secondary | ICD-10-CM

## 2021-08-28 DIAGNOSIS — M19041 Primary osteoarthritis, right hand: Secondary | ICD-10-CM

## 2021-08-28 DIAGNOSIS — I10 Essential (primary) hypertension: Secondary | ICD-10-CM

## 2021-08-28 DIAGNOSIS — M19042 Primary osteoarthritis, left hand: Secondary | ICD-10-CM

## 2021-08-28 DIAGNOSIS — F32A Depression, unspecified: Secondary | ICD-10-CM

## 2021-09-02 ENCOUNTER — Encounter: Payer: Self-pay | Admitting: Family Medicine

## 2021-09-02 ENCOUNTER — Ambulatory Visit (INDEPENDENT_AMBULATORY_CARE_PROVIDER_SITE_OTHER): Payer: Medicare Other | Admitting: Family Medicine

## 2021-09-02 VITALS — BP 145/61 | HR 63 | Ht 65.0 in | Wt 141.0 lb

## 2021-09-02 DIAGNOSIS — I10 Essential (primary) hypertension: Secondary | ICD-10-CM | POA: Diagnosis not present

## 2021-09-02 DIAGNOSIS — F32A Depression, unspecified: Secondary | ICD-10-CM

## 2021-09-02 DIAGNOSIS — M8580 Other specified disorders of bone density and structure, unspecified site: Secondary | ICD-10-CM | POA: Insufficient documentation

## 2021-09-02 DIAGNOSIS — M8588 Other specified disorders of bone density and structure, other site: Secondary | ICD-10-CM | POA: Diagnosis not present

## 2021-09-02 DIAGNOSIS — E785 Hyperlipidemia, unspecified: Secondary | ICD-10-CM | POA: Diagnosis not present

## 2021-09-02 DIAGNOSIS — Z23 Encounter for immunization: Secondary | ICD-10-CM

## 2021-09-02 DIAGNOSIS — G4700 Insomnia, unspecified: Secondary | ICD-10-CM | POA: Diagnosis not present

## 2021-09-02 DIAGNOSIS — M19011 Primary osteoarthritis, right shoulder: Secondary | ICD-10-CM

## 2021-09-02 DIAGNOSIS — F3341 Major depressive disorder, recurrent, in partial remission: Secondary | ICD-10-CM

## 2021-09-02 MED ORDER — METOPROLOL SUCCINATE ER 25 MG PO TB24: 25.0000 mg | ORAL_TABLET | Freq: Every day | ORAL | 3 refills | Status: DC

## 2021-09-02 MED ORDER — ROSUVASTATIN CALCIUM 10 MG PO TABS: 10.0000 mg | ORAL_TABLET | Freq: Every day | ORAL | 3 refills | Status: DC

## 2021-09-02 MED ORDER — TRAZODONE HCL 50 MG PO TABS: 25.0000 mg | ORAL_TABLET | Freq: Every evening | ORAL | 3 refills | Status: DC | PRN

## 2021-09-02 MED ORDER — AMLODIPINE BESYLATE 2.5 MG PO TABS: 2.5000 mg | ORAL_TABLET | Freq: Every day | ORAL | 3 refills | Status: DC

## 2021-09-02 MED ORDER — MELOXICAM 15 MG PO TABS: 15.0000 mg | ORAL_TABLET | Freq: Every day | ORAL | 5 refills | Status: DC

## 2021-09-02 MED ORDER — BUPROPION HCL ER (SR) 150 MG PO TB12: 150.0000 mg | ORAL_TABLET | Freq: Every morning | ORAL | 3 refills | Status: DC
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
## 2021-09-02 NOTE — Patient Instructions (Signed)
Suggest 800 international units of vitamin D daily and 1200 mg elemental calcium for osteopenia or osteoporosis.   

## 2021-09-03 LAB — CBC WITH DIFFERENTIAL/PLATELET
Basophils Absolute: 0 10*3/uL (ref 0.0–0.2)
Basos: 1 %
EOS (ABSOLUTE): 0.2 10*3/uL (ref 0.0–0.4)
Eos: 2 %
Hematocrit: 36.1 % (ref 34.0–46.6)
Hemoglobin: 12.3 g/dL (ref 11.1–15.9)
Immature Grans (Abs): 0 10*3/uL (ref 0.0–0.1)
Immature Granulocytes: 0 %
Lymphocytes Absolute: 1.6 10*3/uL (ref 0.7–3.1)
Lymphs: 23 %
MCH: 31.6 pg (ref 26.6–33.0)
MCHC: 34.1 g/dL (ref 31.5–35.7)
MCV: 93 fL (ref 79–97)
Monocytes Absolute: 0.8 10*3/uL (ref 0.1–0.9)
Monocytes: 11 %
Neutrophils Absolute: 4.5 10*3/uL (ref 1.4–7.0)
Neutrophils: 63 %
Platelets: 315 10*3/uL (ref 150–450)
RBC: 3.89 x10E6/uL (ref 3.77–5.28)
RDW: 12.3 % (ref 11.7–15.4)
WBC: 7 10*3/uL (ref 3.4–10.8)

## 2021-09-03 LAB — CMP14+EGFR
ALT: 22 IU/L (ref 0–32)
AST: 25 IU/L (ref 0–40)
Albumin/Globulin Ratio: 2.2 (ref 1.2–2.2)
Albumin: 4.4 g/dL (ref 3.6–4.6)
Alkaline Phosphatase: 97 IU/L (ref 44–121)
BUN/Creatinine Ratio: 19 (ref 12–28)
BUN: 25 mg/dL (ref 8–27)
Bilirubin Total: <0.2 mg/dL (ref 0.0–1.2)
CO2: 22 mmol/L (ref 20–29)
Calcium: 9.3 mg/dL (ref 8.7–10.3)
Chloride: 97 mmol/L (ref 96–106)
Creatinine, Ser: 1.29 mg/dL — ABNORMAL HIGH (ref 0.57–1.00)
Globulin, Total: 2 g/dL (ref 1.5–4.5)
Glucose: 117 mg/dL — ABNORMAL HIGH (ref 70–99)
Potassium: 4.8 mmol/L (ref 3.5–5.2)
Sodium: 135 mmol/L (ref 134–144)
Total Protein: 6.4 g/dL (ref 6.0–8.5)
eGFR: 42 mL/min/{1.73_m2} — ABNORMAL LOW (ref >59)

## 2021-09-03 LAB — LIPID PANEL
Chol/HDL Ratio: 1.8 ratio (ref 0.0–4.4)
Cholesterol, Total: 182 mg/dL (ref 100–199)
HDL: 101 mg/dL (ref >39)
LDL Chol Calc (NIH): 72 mg/dL (ref 0–99)
Triglycerides: 41 mg/dL (ref 0–149)
VLDL Cholesterol Cal: 9 mg/dL (ref 5–40)

## 2021-09-23 ENCOUNTER — Ambulatory Visit (INDEPENDENT_AMBULATORY_CARE_PROVIDER_SITE_OTHER): Payer: Medicare Other

## 2021-09-23 VITALS — Wt 141.0 lb

## 2021-09-23 DIAGNOSIS — Z0000 Encounter for general adult medical examination without abnormal findings: Secondary | ICD-10-CM | POA: Diagnosis not present

## 2021-09-23 NOTE — Patient Instructions (Addendum)
Ms. Bloodsaw , ?Thank you for taking time to come for your Medicare Wellness Visit. I appreciate your ongoing commitment to your health goals. Please review the following plan we discussed and let me know if I can assist you in the future.  ? ?Screening recommendations/referrals: ?Colonoscopy: Done 12/17/2016 - no repeat required ?Mammogram: Done 03/28/2020 - Repeat annually *due - make appointment soon ?Bone Density: Done 03/04/2021 - Repeat every 2 years  ?Recommended yearly ophthalmology/optometry visit for glaucoma screening and checkup ?Recommended yearly dental visit for hygiene and checkup ? ?Vaccinations: ?Influenza vaccine: Done 03/04/2021 - Repeat annually ?Pneumococcal vaccine: Done 02/25/2020 - due for Prevnar* ?Tdap vaccine: Done 02/25/2020 - Repeat in 10 years ?Shingles vaccine: Done 09/02/2021 - get second dose at next visit*   ?Covid-19: Done 09/25/2019 & 11/12/2019 ? ?Advanced directives: Please bring a copy of your health care power of attorney and living will to the office to be added to your chart at your convenience.  ? ?Conditions/risks identified: Aim for 30 minutes of exercise or brisk walking, 6-8 glasses of water, and 5 servings of fruits and vegetables each day. Try the chair exercises and stationary bike. ? ?Next appointment: Follow up in one year for your annual wellness visit  ? ? ?Preventive Care 54 Years and Older, Female ?Preventive care refers to lifestyle choices and visits with your health care provider that can promote health and wellness. ?What does preventive care include? ?A yearly physical exam. This is also called an annual well check. ?Dental exams once or twice a year. ?Routine eye exams. Ask your health care provider how often you should have your eyes checked. ?Personal lifestyle choices, including: ?Daily care of your teeth and gums. ?Regular physical activity. ?Eating a healthy diet. ?Avoiding tobacco and drug use. ?Limiting alcohol use. ?Practicing safe sex. ?Taking low-dose  aspirin every day. ?Taking vitamin and mineral supplements as recommended by your health care provider. ?What happens during an annual well check? ?The services and screenings done by your health care provider during your annual well check will depend on your age, overall health, lifestyle risk factors, and family history of disease. ?Counseling  ?Your health care provider may ask you questions about your: ?Alcohol use. ?Tobacco use. ?Drug use. ?Emotional well-being. ?Home and relationship well-being. ?Sexual activity. ?Eating habits. ?History of falls. ?Memory and ability to understand (cognition). ?Work and work Astronomer. ?Reproductive health. ?Screening  ?You may have the following tests or measurements: ?Height, weight, and BMI. ?Blood pressure. ?Lipid and cholesterol levels. These may be checked every 5 years, or more frequently if you are over 54 years old. ?Skin check. ?Lung cancer screening. You may have this screening every year starting at age 91 if you have a 30-pack-year history of smoking and currently smoke or have quit within the past 15 years. ?Fecal occult blood test (FOBT) of the stool. You may have this test every year starting at age 56. ?Flexible sigmoidoscopy or colonoscopy. You may have a sigmoidoscopy every 5 years or a colonoscopy every 10 years starting at age 56. ?Hepatitis C blood test. ?Hepatitis B blood test. ?Sexually transmitted disease (STD) testing. ?Diabetes screening. This is done by checking your blood sugar (glucose) after you have not eaten for a while (fasting). You may have this done every 1-3 years. ?Bone density scan. This is done to screen for osteoporosis. You may have this done starting at age 20. ?Mammogram. This may be done every 1-2 years. Talk to your health care provider about how often you should have  regular mammograms. ?Talk with your health care provider about your test results, treatment options, and if necessary, the need for more tests. ?Vaccines  ?Your  health care provider may recommend certain vaccines, such as: ?Influenza vaccine. This is recommended every year. ?Tetanus, diphtheria, and acellular pertussis (Tdap, Td) vaccine. You may need a Td booster every 10 years. ?Zoster vaccine. You may need this after age 28. ?Pneumococcal 13-valent conjugate (PCV13) vaccine. One dose is recommended after age 22. ?Pneumococcal polysaccharide (PPSV23) vaccine. One dose is recommended after age 64. ?Talk to your health care provider about which screenings and vaccines you need and how often you need them. ?This information is not intended to replace advice given to you by your health care provider. Make sure you discuss any questions you have with your health care provider. ?Document Released: 06/13/2015 Document Revised: 02/04/2016 Document Reviewed: 03/18/2015 ?Elsevier Interactive Patient Education ? 2017 [place]. ? ?Fall Prevention in the Home ?Falls can cause injuries. They can happen to people of all ages. There are many things you can do to make your home safe and to help prevent falls. ?What can I do on the outside of my home? ?Regularly fix the edges of walkways and driveways and fix any cracks. ?Remove anything that might make you trip as you walk through a door, such as a raised step or threshold. ?Trim any bushes or trees on the path to your home. ?Use bright outdoor lighting. ?Clear any walking paths of anything that might make someone trip, such as rocks or tools. ?Regularly check to see if handrails are loose or broken. Make sure that both sides of any steps have handrails. ?Any raised decks and porches should have guardrails on the edges. ?Have any leaves, snow, or ice cleared regularly. ?Use sand or salt on walking paths during winter. ?Clean up any spills in your garage right away. This includes oil or grease spills. ?What can I do in the bathroom? ?Use night lights. ?Install grab bars by the toilet and in the tub and shower. Do not use towel bars as  grab bars. ?Use non-skid mats or decals in the tub or shower. ?If you need to sit down in the shower, use a plastic, non-slip stool. ?Keep the floor dry. Clean up any water that spills on the floor as soon as it happens. ?Remove soap buildup in the tub or shower regularly. ?Attach bath mats securely with double-sided non-slip rug tape. ?Do not have throw rugs and other things on the floor that can make you trip. ?What can I do in the bedroom? ?Use night lights. ?Make sure that you have a light by your bed that is easy to reach. ?Do not use any sheets or blankets that are too big for your bed. They should not hang down onto the floor. ?Have a firm chair that has side arms. You can use this for support while you get dressed. ?Do not have throw rugs and other things on the floor that can make you trip. ?What can I do in the kitchen? ?Clean up any spills right away. ?Avoid walking on wet floors. ?Keep items that you use a lot in easy-to-reach places. ?If you need to reach something above you, use a strong step stool that has a grab bar. ?Keep electrical cords out of the way. ?Do not use floor polish or wax that makes floors slippery. If you must use wax, use non-skid floor wax. ?Do not have throw rugs and other things on the floor that  can make you trip. ?What can I do with my stairs? ?Do not leave any items on the stairs. ?Make sure that there are handrails on both sides of the stairs and use them. Fix handrails that are broken or loose. Make sure that handrails are as long as the stairways. ?Check any carpeting to make sure that it is firmly attached to the stairs. Fix any carpet that is loose or worn. ?Avoid having throw rugs at the top or bottom of the stairs. If you do have throw rugs, attach them to the floor with carpet tape. ?Make sure that you have a light switch at the top of the stairs and the bottom of the stairs. If you do not have them, ask someone to add them for you. ?What else can I do to help prevent  falls? ?Wear shoes that: ?Do not have high heels. ?Have rubber bottoms. ?Are comfortable and fit you well. ?Are closed at the toe. Do not wear sandals. ?If you use a stepladder: ?Make sure that it is fully o

## 2021-09-23 NOTE — Progress Notes (Signed)
? ?Subjective:  ? Pamela Hughes is a 81 y.o. female who presents for Medicare Annual (Subsequent) preventive examination. ? ?Virtual Visit via Telephone Note ? ?I connected with  Pamela Hughes on 09/23/21 at  2:00 PM EDT by telephone and verified that I am speaking with the correct person using two identifiers. ? ?Location: ?Patient: Home ?Provider: [facility] ?Persons participating in the virtual visit: patient/Nurse Health Advisor ?  ?I discussed the limitations, risks, security and privacy concerns of performing an evaluation and management service by telephone and the availability of in person appointments. The patient expressed understanding and agreed to proceed. ? ?Interactive audio and video telecommunications were attempted between this nurse and patient, however failed, due to patient having technical difficulties OR patient did not have access to video capability.  We continued and completed visit with audio only. ? ?Some vital signs may be absent or patient reported.  ? ?Brynn Reznik Dionne Ano, LPN  ? ?Review of Systems    ? ?Cardiac Risk Factors include: advanced age (>74men, >72 women);sedentary lifestyle;dyslipidemia;hypertension;Other (see comment), Risk factor comments: hx of CVA ? ?   ?Objective:  ?  ?Today's Vitals  ? 09/23/21 1359  ?Weight: 141 lb (64 kg)  ? ?Body mass index is 23.46 kg/m?. ? ? ?  09/23/2021  ?  2:12 PM 09/22/2020  ?  3:02 PM 09/20/2019  ?  2:42 PM 12/22/2018  ?  6:59 PM 12/15/2018  ?  5:50 AM 09/26/2018  ?  4:21 PM 09/21/2018  ?  1:09 PM  ?Advanced Directives  ?Does Patient Have a Medical Advance Directive? No No No No No No No  ?Would patient like information on creating a medical advance directive? No - Patient declined No - Patient declined No - Patient declined No - Patient declined No - Patient declined No - Patient declined No - Patient declined  ? ? ?Current Medications (verified) ?Outpatient Encounter Medications as of 09/23/2021  ?Medication Sig  ? albuterol (VENTOLIN HFA) 108 (90 Base)  MCG/ACT inhaler USE 2 PUFFS EVERY 6 HOURS AS NEEDED  ? amLODipine (NORVASC) 2.5 MG tablet Take 1 tablet (2.5 mg total) by mouth daily.  ? buPROPion (WELLBUTRIN SR) 150 MG 12 hr tablet Take 1 tablet (150 mg total) by mouth every morning.  ? ipratropium (ATROVENT) 0.03 % nasal spray USE TWICE DAILY AS DIRECTED  ? meclizine (ANTIVERT) 25 MG tablet TAKE ONE TABLET 3 TIMES A DAY AS NEEDED.  ? meloxicam (MOBIC) 15 MG tablet Take 1 tablet (15 mg total) by mouth daily.  ? metoprolol succinate (TOPROL-XL) 25 MG 24 hr tablet Take 1 tablet (25 mg total) by mouth daily.  ? omeprazole (PRILOSEC) 20 MG capsule Take 1 capsule (20 mg total) by mouth daily.  ? rosuvastatin (CRESTOR) 10 MG tablet Take 1 tablet (10 mg total) by mouth daily.  ? traZODone (DESYREL) 50 MG tablet Take 0.5-1 tablets (25-50 mg total) by mouth at bedtime as needed for sleep.  ? triamcinolone cream (KENALOG) 0.1 % APPLY TWICE DAILY  ? ?No facility-administered encounter medications on file as of 09/23/2021.  ? ? ?Allergies (verified) ?Patient has no known allergies.  ? ?History: ?Past Medical History:  ?Diagnosis Date  ? Alopecia   ? Anxiety   ? Arthritis   ? Asthma   ? Collagenous colitis   ? Depression   ? Diarrhea   ? chronic- "states for at least a year)  ? Edema   ? GERD (gastroesophageal reflux disease)   ? HOH (hard of  hearing)   ? Hyperlipidemia   ? Hypertension   ? ?Past Surgical History:  ?Procedure Laterality Date  ? ABDOMINAL HYSTERECTOMY    ? one ovary left  ? CATARACT EXTRACTION W/PHACO Right 02/17/2018  ? Procedure: CATARACT EXTRACTION PHACO AND INTRAOCULAR LENS PLACEMENT (IOC);  Surgeon: Baruch Goldmann, MD;  Location: [facility];  Service: Ophthalmology;  Laterality: Right;  CDE: 4.84  ? CATARACT EXTRACTION W/PHACO Left 03/31/2018  ? Procedure: CATARACT EXTRACTION PHACO AND INTRAOCULAR LENS PLACEMENT (IOC) LEFT CDE: 4.51;  Surgeon: Baruch Goldmann, MD;  Location: [facility];  Service: Ophthalmology;  Laterality: Left;  ? COLONOSCOPY N/A 12/17/2016  ?  Procedure: COLONOSCOPY;  Surgeon: Rogene Houston, MD;  Location: [facility];  Service: Endoscopy;  Laterality: N/A;  2:05  ? SPINE SURGERY  1990s  ? L spine  ? ?Family History  ?Problem Relation Age of Onset  ? Alzheimer's disease Mother   ? Heart disease Mother   ? Stroke Mother   ? Blindness Mother   ? Alcohol abuse Father   ? Diabetes Brother   ? Healthy Daughter   ? Breast cancer Maternal Aunt   ? ?Social History  ? ?Socioeconomic History  ? Marital status: Divorced  ?  Spouse name: Not on file  ? Number of children: 1  ? Years of education: 10  ? Highest education level: 10th grade  ?Occupational History  ? 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 Smoking status: Former  ?  Packs/day: 0.25  ?  Years: 5.00  ?  Pack years: 1.25  ?  Types: Cigarettes  ?  Quit date: 10/09/1974  ?  Years since quitting: 46.9  ? Smokeless tobacco: Never  ?Vaping Use  ? Vaping Use: Never used  ?Substance and Sexual Activity  ? Alcohol use: Yes  ?  Alcohol/week: 2.0 standard drinks  ?  Types: 2 Glasses of wine per week  ? Drug use: No  ? Sexual activity: Yes  ?  Birth control/protection: Surgical  ?Other Topics Concern  ? Not on file  ?Social History Narrative  ? Lives alone  ? One dog   ? Her daughter lives nearby and checks on her often  ? ?Social Determinants of Health  ? ?Financial Resource Strain: Low Risk   ? Difficulty of Paying Living Expenses: Not hard at all  ?Food Insecurity: No Food Insecurity  ? Worried About Charity fundraiser in the Last Year: Never true  ? Ran Out of Food in the Last Year: Never true  ?Transportation Needs: No Transportation Needs  ? Lack of Transportation (Medical): No  ? Lack of Transportation (Non-Medical): No  ?Physical Activity: Insufficiently Active  ? Days of Exercise per Week: 2 days  ? Minutes of Exercise per Session: 30 min  ?Stress: Stress Concern Present  ? Feeling of Stress : To some extent  ?Social Connections: Socially Isolated  ? Frequency of Communication with  Friends and Family: Twice a week  ? Frequency of Social Gatherings with Friends and Family: Twice a week  ? Attends Religious Services: Never  ? Active Member of Clubs or Organizations: No  ? Attends Archivist Meetings: Never  ? Marital Status: Divorced  ? ? ?Tobacco Counseling ?Counseling given: Not Answered ? ? ?Clinical Intake: ? ?Pre-visit preparation completed: Yes ? ?Pain : No/denies pain ? ?  ? ?BMI - recorded: 23.46 ?Nutritional Status: BMI of 19-24  Normal ?Nutritional Risks: None ?Diabetes: No ? ?How often do  you need to have someone help you when you read instructions, pamphlets, or other written materials from your doctor or pharmacy?: 1 - Never ? ?Diabetic? no ? ?Interpreter Needed?: No ? ?Information entered by :: Juleon Narang, LPN ? ? ?Activities of Daily Living ? ?  09/23/2021  ?  2:05 PM  ?In your present state of health, do you have any difficulty performing the following activities:  ?Hearing? 0  ?Vision? 0  ?Difficulty concentrating or making decisions? 1  ?Walking or climbing stairs? 1  ?Comment has to hold rails and go slow  ?Dressing or bathing? 0  ?Doing errands, shopping? 1  ?Preparing Food and eating ? N  ?Using the Toilet? N  ?In the past six months, have you accidently leaked urine? N  ?Do you have problems with loss of bowel control? Y  ?Comment colitis - mostly under control now  ?Managing your Medications? N  ?Managing your Finances? N  ?Housekeeping or managing your Housekeeping? N  ? ? ?Patient Care Team: ?Dettinger, Fransisca Kaufmann, MD as PCP - General (Family Medicine) ?Minus Breeding, MD as PCP - Cardiology (Cardiology) ?Rogene Houston, MD as Consulting Physician (Gastroenterology) ?Ilean China, RN as Case Manager ?Katha Cabal, LCSW as Education officer, museum (Licensed Holiday representative) ?Sanjuana Kava, MD as Attending Physician (Orthopedic Surgery) ? ?Indicate any recent Medical Services you may have received from other than [facility] providers in the past year (date may  be approximate). ? ?   ?Assessment:  ? This is a routine wellness examination for Lurleen. ? ?Hearing/Vision screen ?Hearing Screening - Comments:: Denies hearing difficulties   ?Vision Screening - Comments:: Wears

## 2021-10-14 ENCOUNTER — Ambulatory Visit (INDEPENDENT_AMBULATORY_CARE_PROVIDER_SITE_OTHER): Payer: Medicare Other | Admitting: Licensed Clinical Social Worker

## 2021-10-14 DIAGNOSIS — I10 Essential (primary) hypertension: Secondary | ICD-10-CM

## 2021-10-14 DIAGNOSIS — M19041 Primary osteoarthritis, right hand: Secondary | ICD-10-CM

## 2021-10-14 DIAGNOSIS — F32A Depression, unspecified: Secondary | ICD-10-CM

## 2021-10-14 DIAGNOSIS — Z8673 Personal history of transient ischemic attack (TIA), and cerebral infarction without residual deficits: Secondary | ICD-10-CM

## 2021-10-14 DIAGNOSIS — E785 Hyperlipidemia, unspecified: Secondary | ICD-10-CM

## 2021-10-14 DIAGNOSIS — R0609 Other forms of dyspnea: Secondary | ICD-10-CM

## 2021-10-14 DIAGNOSIS — R483 Visual agnosia: Secondary | ICD-10-CM

## 2021-10-14 NOTE — Chronic Care Management (AMB) (Signed)
Chronic Care Management    Clinical Social Work Note  10/14/2021 Name: Pamela Hughes MRN: [id number] DOB: [birth year]  Pamela Hughes is a 81 y.o. year old female who is a primary care patient of Dettinger, Elige Radon, MD. The CCM team was consulted to assist the patient with chronic disease management and/or care coordination needs related to: [name] .   Engaged with patient by telephone for follow up visit in response to provider referral for social work chronic care management and care coordination services.   Consent to Services:  The patient was given information about Chronic Care Management services, agreed to services, and gave verbal consent prior to initiation of services.  Please see initial visit note for detailed documentation.   Patient agreed to services and consent obtained.   Assessment: Review of patient past medical history, allergies, medications, and health status, including review of relevant consultants reports was performed today as part of a comprehensive evaluation and provision of chronic care management and care coordination services.     SDOH (Social Determinants of Health) assessments and interventions performed:  SDOH Interventions    Flowsheet Row Most Recent Value  SDOH Interventions   Stress Interventions Provide Counseling  [client has stress related to managing medical needs. client has stress related to difficulty sleeping]  Depression Interventions/Treatment  Counseling, Medication        Advanced Directives Status: See [employer]yn[employer] application for related entries.  CCM Care Plan  No Known Allergies  Outpatient Encounter Medications as of 10/14/2021  Medication Sig   albuterol (VENTOLIN HFA) 108 (90 Base) MCG/ACT inhaler USE 2 PUFFS EVERY 6 HOURS AS NEEDED   amLODipine (NORVASC) 2.5 MG tablet Take 1 tablet (2.5 mg total) by mouth daily.   buPROPion (WELLBUTRIN SR) 150 MG 12 hr tablet Take 1 tablet (150 mg total) by mouth every morning.    ipratropium (ATROVENT) 0.03 % nasal spray USE TWICE DAILY AS DIRECTED   meclizine (ANTIVERT) 25 MG tablet TAKE ONE TABLET 3 TIMES A DAY AS NEEDED.   meloxicam (MOBIC) 15 MG tablet Take 1 tablet (15 mg total) by mouth daily.   metoprolol succinate (TOPROL-XL) 25 MG 24 hr tablet Take 1 tablet (25 mg total) by mouth daily.   omeprazole (PRILOSEC) 20 MG capsule Take 1 capsule (20 mg total) by mouth daily.   rosuvastatin (CRESTOR) 10 MG tablet Take 1 tablet (10 mg total) by mouth daily.   traZODone (DESYREL) 50 MG tablet Take 0.5-1 tablets (25-50 mg total) by mouth at bedtime as needed for sleep.   triamcinolone cream (KENALOG) 0.1 % APPLY TWICE DAILY   No facility-administered encounter medications on file as of 10/14/2021.    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 Monitor client management of depression issues  Care Plan : LCSW care plan  Updates made by [name], LCSW since 10/14/2021 12:00 AM     Problem: Depression Identification (Depression)  Goal: Depressive Symptoms Identified;Manage depression symptoms faced   Start Date: 08/20/2021  Expected End Date: 12/22/2021  This Visit's Progress: On track  Recent Progress: On track  Priority: Medium  Note:    Current Barriers:  Food needs Mobility issues Decreased family support Dental needs   Social Work Clinical Goal(s):  Over the next 30 days, patient will communicate with LCSW to discuss food needs of  client Over the next 30 days, patient will communicate with LCSW to discuss depression issues of client Over the next 30 days, patient will keep all scheduled medical appointments Over next 30 days client will communicate as needed with RNCM to discuss nursing needs of client   Interventions:  1:1 collaboration with Dr. Elige Radon Dettinger MD regarding development and update of comprehensive plan of care as evidenced by provider attestation and co-signature LCSW discussed with client dizziness of client . She said she does get dizzy occasionally Discussed energy level of client. She said she is often tired or often feels fatigued. Reviewed sleeping issues of client.  Client said she has reduced sleep.   Reviewed food procurement with client. She said she receives Meals on Wheels frozen meals monthly. These frozen meals are helpful to client. She also said she gets [facility] Healthy Choice benefit each month. She said that this Healthy Choice food benefit each month is very helpful to her Encouraged client to call RNCM as needed for nursing support Reviewed medication procurement with client. Client said she has her prescribed medications at present and is taking medications as prescribed. Reviewed pain issues of client. Reviewed family support.  Client said she has reduced family support. She does get help from her cousin, [name]. He helps her with transport needs, obtaining pharmacy items, helps her obtain groceries. Provided counseling support for client Reviewed mood status of client. She said she does get sad occasionally. But, overall, she feels that her mood is stable.  Discussed cardiology support for client  Patient Self Care Activities:  Performs ADL's independently Unable to drive but her cousin assists with transportation  Patient Deficits Some mobility challenges Food issues   Patient Goals: Patient will call LCSW or RNCM as needed in next 30 days for CCM support Patient will  attend all scheduled medical appointments in next 30 days Patient will allow time for rest and relaxation in next 30 days  Follow Up Plan:  LCSW to call client on  11/23/21  at 10:00 AM to assess client needs at that time        [name].Bitania Shankland MSW, LCSW Licensed Visual merchandiser [facility] Care Management [phone]

## 2021-10-14 NOTE — Patient Instructions (Addendum)
Visit Information ? ?Patient Goals:  Protect My Health (Patient).  Manage Depression symptoms ? ?Timeframe:  Short-Term Goal ?Priority:  Medium ?Progress: On Track ?Start Date:            08/20/21                ?Expected End Date:        12/22/21              ? ?Follow Up Date 11/23/21 at 10:00 AM  ? ?Protect My Health (Patient) Manage Depression symptoms  ?  ?Why is this important?   ?Screening tests can find diseases early when they are easier to treat.  ?Your doctor or nurse will talk with you about which tests are important for you.  ?Getting shots for common diseases like the flu and shingles will help prevent them.   ?  ?Patient Self Care Activities:  ?Performs ADL's independently ?Unable to drive but her cousin assists with transportation ? ?Patient Deficits ?Some mobility challenges ?Food issues ?  ?Patient Goals: ?Patient will call LCSW or RNCM as needed in next 30 days for CCM support ?Patient will attend all scheduled medical appointments in next 30 days ?Patient will allow time for rest and relaxation in next 30 days ? ?Follow Up Plan:  LCSW to call client on 11/23/21 at 10:00 AM to assess client needs  ? ?[name].Taron Mondor MSW, LCSW ?Licensed Clinical Social Worker ?[facility] Care Management ?[phone] ?

## 2021-10-28 DIAGNOSIS — I10 Essential (primary) hypertension: Secondary | ICD-10-CM

## 2021-10-28 DIAGNOSIS — F32A Depression, unspecified: Secondary | ICD-10-CM

## 2021-10-28 DIAGNOSIS — M19042 Primary osteoarthritis, left hand: Secondary | ICD-10-CM

## 2021-10-28 DIAGNOSIS — M19041 Primary osteoarthritis, right hand: Secondary | ICD-10-CM

## 2021-10-28 DIAGNOSIS — E785 Hyperlipidemia, unspecified: Secondary | ICD-10-CM

## 2021-11-11 ENCOUNTER — Other Ambulatory Visit: Payer: Self-pay | Admitting: Family Medicine

## 2021-11-23 ENCOUNTER — Ambulatory Visit (INDEPENDENT_AMBULATORY_CARE_PROVIDER_SITE_OTHER): Payer: Medicare Other | Admitting: Licensed Clinical Social Worker

## 2021-11-23 DIAGNOSIS — F32A Depression, unspecified: Secondary | ICD-10-CM

## 2021-11-23 DIAGNOSIS — Z8673 Personal history of transient ischemic attack (TIA), and cerebral infarction without residual deficits: Secondary | ICD-10-CM

## 2021-11-23 DIAGNOSIS — M19042 Primary osteoarthritis, left hand: Secondary | ICD-10-CM

## 2021-11-23 DIAGNOSIS — I10 Essential (primary) hypertension: Secondary | ICD-10-CM

## 2021-11-23 DIAGNOSIS — E785 Hyperlipidemia, unspecified: Secondary | ICD-10-CM

## 2021-11-23 NOTE — Chronic Care Management (AMB) (Signed)
Chronic Care Management    Clinical Social Work Note  11/23/2021 Name: Pamela Hughes MRN: [id number] DOB: [birth year]  Pamela Hughes is a 81 y.o. year old female who is a primary care patient of Dettinger, Elige Radon, MD. The CCM team was consulted to assist the patient with chronic disease management and/or care coordination needs related to: [name] .   Engaged with patient by telephone for follow up visit in response to provider referral for social work chronic care management and care coordination services.   Consent to Services:  The patient was given information about Chronic Care Management services, agreed to services, and gave verbal consent prior to initiation of services.  Please see initial visit note for detailed documentation.   Patient agreed to services and consent obtained.   Assessment: Review of patient past medical history, allergies, medications, and health status, including review of relevant consultants reports was performed today as part of a comprehensive evaluation and provision of chronic care management and care coordination services.     SDOH (Social Determinants of Health) assessments and interventions performed:  SDOH Interventions    Flowsheet Row Most Recent Value  SDOH Interventions   Stress Interventions Provide Counseling  [client has stress related to finances. Client has stress related to managing dental needs]  Depression Interventions/Treatment  Counseling, Medication        Advanced Directives Status: See Vynca application for related entries.  CCM Care Plan  No Known Allergies  Outpatient Encounter Medications as of 11/23/2021  Medication Sig   triamcinolone cream (KENALOG) 0.1 % APPLY TWICE DAILY   albuterol (VENTOLIN HFA) 108 (90 Base) MCG/ACT inhaler USE 2 PUFFS EVERY 6 HOURS AS NEEDED   amLODipine (NORVASC) 2.5 MG tablet Take 1 tablet (2.5 mg total) by mouth daily.   buPROPion (WELLBUTRIN SR) 150 MG 12 hr tablet Take 1  tablet (150 mg total) by mouth every morning.   ipratropium (ATROVENT) 0.03 % nasal spray USE 2 SPRAYS IN EACH NOSTRIL TWICE DAILYAS DIRECTED   meclizine (ANTIVERT) 25 MG tablet TAKE ONE TABLET 3 TIMES A DAY AS NEEDED.   meloxicam (MOBIC) 15 MG tablet Take 1 tablet (15 mg total) by mouth daily.   metoprolol succinate (TOPROL-XL) 25 MG 24 hr tablet Take 1 tablet (25 mg total) by mouth daily.   omeprazole (PRILOSEC) 20 MG capsule TAKE ONE CAPSULE BY MOUTH DAILY   rosuvastatin (CRESTOR) 10 MG tablet Take 1 tablet (10 mg total) by mouth daily.   traZODone (DESYREL) 50 MG tablet Take 0.5-1 tablets (25-50 mg total) by mouth at bedtime as needed for sleep.   No facility-administered encounter medications on file as of 11/23/2021.    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   Allergic rhinitis 01/31/2015   Hypertension    Depression    Alopecia    Arthritis     Conditions to be addressed/monitored: Monitor client management of depression issues  Care Plan : LCSW care plan  Updates made by [name], LCSW since 11/23/2021 12:00 AM     Problem: Depression Identification (Depression)      Goal: Depressive Symptoms Identified;Manage depression  symptoms faced   Start Date: 08/20/2021  Expected End Date: 02/22/2022  This Visit's Progress: On track  Recent Progress: On track  Priority: Medium  Note:    Current Barriers:  Food needs Mobility issues Decreased family support Dental needs   Social Work Clinical Goal(s):  Over the next 30 days, patient will communicate with LCSW to discuss food needs of client Over the next 30  days, patient will communicate with LCSW to discuss depression issues of client Over the next 30 days, patient will keep all scheduled medical appointments Over next 30 days client will communicate as needed with RNCM to discuss nursing needs of client   Interventions:  1:1 collaboration with Dr. Elige Radon Dettinger MD regarding development and update of comprehensive plan of care as evidenced by provider attestation and co-signature LCSW discussed with client dizziness of client . She said she does get dizzy occasionally Discussed energy level of client. She said she is often tired or often feels fatigued. Reviewed sleeping issues of client.  Client said she has reduced sleep.   Encouraged client to call RNCM as needed for nursing support Reviewed medication procurement with client. Client said she has her prescribed medications at present and is taking medications as prescribed. Reviewed pain issues of client. Reviewed family support.  Client said she has reduced family support. She does get help from her cousin, [name]. He helps her with transport needs, obtaining pharmacy items, helps her obtain groceries. Provided counseling support for client Discussed dental needs of client. She said she is looking for a dentist to help with her dental needs LCSW gave client phone number for [facility] in [place], [place].  Client wrote down number and read back to LCSW. She has written down correct number for [facility] and she plans to call them soon to discuss options related to dental care. Reviewed mood status of client. She said she does get sad occasionally. But, overall, she feels that her mood is stable.  Discussed cardiology support for client. Client said she needed to make appointment with cardiologist. LCSW gave client name of cardiologist, Dr. Antoine Poche, and gave her phone number for Dr. Antoine Poche. She said she plans to call Dr. Jenene Slicker office to set up cardiology appointment.  She said she goes one time a year to see cardiologist  Patient Self Care Activities:  Performs ADL's independently Unable to drive but her cousin assists with transportation  Patient Deficits Some mobility challenges Food issues   Patient Goals: Patient will call LCSW or RNCM as needed in next 30 days for CCM support Patient will attend all scheduled medical appointments in next 30 days Patient will allow time for rest and relaxation in next 30 days  Follow Up Plan:  LCSW to call client on  12/29/21 at 1:00 PM        [name].Mohsen Odenthal MSW, LCSW Licensed Visual merchandiser [facility] Care Management [phone]

## 2021-11-27 DIAGNOSIS — I10 Essential (primary) hypertension: Secondary | ICD-10-CM

## 2021-11-27 DIAGNOSIS — M19042 Primary osteoarthritis, left hand: Secondary | ICD-10-CM

## 2021-11-27 DIAGNOSIS — M19041 Primary osteoarthritis, right hand: Secondary | ICD-10-CM

## 2021-11-27 DIAGNOSIS — F32A Depression, unspecified: Secondary | ICD-10-CM

## 2021-11-27 DIAGNOSIS — E785 Hyperlipidemia, unspecified: Secondary | ICD-10-CM

## 2021-12-07 ENCOUNTER — Ambulatory Visit: Payer: Self-pay | Admitting: *Deleted

## 2021-12-07 DIAGNOSIS — F32A Depression, unspecified: Secondary | ICD-10-CM

## 2021-12-07 DIAGNOSIS — I10 Essential (primary) hypertension: Secondary | ICD-10-CM

## 2021-12-07 NOTE — Patient Instructions (Addendum)
  Pamela Hughes  I have enjoyed working with you through the Chronic Care Management Program at [facility]. Due to program changes and no recent contact with the RN Care Manager, I am removing myself from your care team. If you are currently active with another CCM Team Member, you will remain active with them unless they reach out to you with additional information. If you feel that you need RN Care Management services in the future, please talk with your primary care provider to discuss re-engagement with the RN Care Manager.   Thank you for allowing me to participate in your your healthcare journey.  [name], BSN, RN-BC Embedded Chronic Care Manager [facility] Care Management Direct Dial: [phone]

## 2021-12-07 NOTE — Chronic Care Management (AMB) (Signed)
  Chronic Care Management   Note  12/07/2021 Name: Pamela Hughes MRN: [id number] DOB: June 05, 1940  Patient has not recently engaged with the Chronic Care Management RN Care Manager. Removing RN Care Manager from Care Team and closing RN Care Management Care Plans. If patient is currently engaged with another CCM team member I will forward this encounter to inform them of my case closure. Patient may be eligible for re-engagement with RN Care Manager in the future if necessary and can discuss this with their PCP.  [name], BSN, RN-BC Embedded Chronic Care Manager [facility] Care Management Direct Dial: [phone]

## 2021-12-16 ENCOUNTER — Telehealth: Payer: Medicare Other

## 2021-12-29 ENCOUNTER — Ambulatory Visit (INDEPENDENT_AMBULATORY_CARE_PROVIDER_SITE_OTHER): Payer: Medicare Other | Admitting: Licensed Clinical Social Worker

## 2021-12-29 DIAGNOSIS — I10 Essential (primary) hypertension: Secondary | ICD-10-CM

## 2021-12-29 DIAGNOSIS — M19041 Primary osteoarthritis, right hand: Secondary | ICD-10-CM

## 2021-12-29 DIAGNOSIS — E785 Hyperlipidemia, unspecified: Secondary | ICD-10-CM

## 2021-12-29 DIAGNOSIS — R483 Visual agnosia: Secondary | ICD-10-CM

## 2021-12-29 DIAGNOSIS — R0609 Other forms of dyspnea: Secondary | ICD-10-CM

## 2021-12-29 NOTE — Patient Instructions (Addendum)
Visit Information  Patient goals:  Protect My Health (Patient).  Manage Depression symptoms   Timeframe:  Short-Term Goal Priority:  Medium Progress: On Track Start Date:            08/20/21                Expected End Date:        02/22/22                 Follow Up Date  LCSW is discharging client today from CCM SW services  Protect My Health (Patient) Manage Depression symptoms    Why is this important?   Screening tests can find diseases early when they are easier to treat.  Your doctor or nurse will talk with you about which tests are important for you.  Getting shots for common diseases like the flu and shingles will help prevent them.     Patient Self Care Activities:  Performs ADL's independently Unable to drive but her cousin assists with transportation  Patient Deficits Some mobility challenges Food issues   Patient Goals: Patient will call LCSW or RNCM as needed in next 30 days for CCM support Patient will attend all scheduled medical appointments in next 30 days Patient will allow time for rest and relaxation in next 30 days  Follow Up Plan:  LCSW is discharging client today from CCM SW services Client will begin participating in Care Management Program support through [facility]  [name].Israa Caban MSW, LCSW Licensed Visual merchandiser [facility] Care Management [phone]

## 2021-12-29 NOTE — Chronic Care Management (AMB) (Signed)
Chronic Care Management    Clinical Social Work Note  12/29/2021 Name: Pamela Hughes MRN: [id number] DOB: Nov 29, 1940  Pamela Hughes is a 81 y.o. year old female who is a primary care patient of Dettinger, Elige Radon, MD. The CCM team was consulted to assist the patient with chronic disease management and/or care coordination needs related to: [name] .   Engaged with patient by telephone for follow up visit in response to provider referral for social work chronic care management and care coordination services.   Consent to Services:  The patient was given information about Chronic Care Management services, agreed to services, and gave verbal consent prior to initiation of services.  Please see initial visit note for detailed documentation.   Patient agreed to services and consent obtained.   Assessment: Review of patient past medical history, allergies, medications, and health status, including review of relevant consultants reports was performed today as part of a comprehensive evaluation and provision of chronic care management and care coordination services.     SDOH (Social Determinants of Health) assessments and interventions performed:  SDOH Interventions    Flowsheet Row Most Recent Value  SDOH Interventions   Stress Interventions Provide Counseling  [client has stress related to difficulty sleeping]  Depression Interventions/Treatment  Counseling, Medication        Advanced Directives Status: See Vynca application for related entries.  CCM Care Plan  No Known Allergies  Outpatient Encounter Medications as of 12/29/2021  Medication Sig   triamcinolone cream (KENALOG) 0.1 % APPLY TWICE DAILY   albuterol (VENTOLIN HFA) 108 (90 Base) MCG/ACT inhaler USE 2 PUFFS EVERY 6 HOURS AS NEEDED   amLODipine (NORVASC) 2.5 MG tablet Take 1 tablet (2.5 mg total) by mouth daily.   buPROPion (WELLBUTRIN SR) 150 MG 12 hr tablet Take 1 tablet (150 mg total) by mouth every morning.    ipratropium (ATROVENT) 0.03 % nasal spray USE 2 SPRAYS IN EACH NOSTRIL TWICE DAILYAS DIRECTED   meclizine (ANTIVERT) 25 MG tablet TAKE ONE TABLET 3 TIMES A DAY AS NEEDED.   meloxicam (MOBIC) 15 MG tablet Take 1 tablet (15 mg total) by mouth daily.   metoprolol succinate (TOPROL-XL) 25 MG 24 hr tablet Take 1 tablet (25 mg total) by mouth daily.   omeprazole (PRILOSEC) 20 MG capsule TAKE ONE CAPSULE BY MOUTH DAILY   rosuvastatin (CRESTOR) 10 MG tablet Take 1 tablet (10 mg total) by mouth daily.   traZODone (DESYREL) 50 MG tablet Take 0.5-1 tablets (25-50 mg total) by mouth at bedtime as needed for sleep.   No facility-administered encounter medications on file as of 12/29/2021.    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   Allergic rhinitis 01/31/2015   Hypertension    Depression    Alopecia    Arthritis     Conditions to be addressed/monitored: monitor client management of depression issues  Care Plan : LCSW care plan  Updates made by [name], LCSW since 12/29/2021 12:00 AM     Problem: Depression Identification (Depression)      Goal: Depressive Symptoms Identified;Manage depression symptoms faced   Start Date: 08/20/2021  Expected End Date: 02/22/2022  This Visit's Progress: On track  Recent Progress: On track  Priority: Medium  Note:    Current Barriers:  Food needs Mobility issues Decreased family support Dental needs   Social Work Clinical Goal(s):  Over the next 30 days, patient will communicate with LCSW to discuss food needs of client Over the next 30 days, patient will communicate with LCSW to discuss  depression issues of client Over the next 30 days, patient will keep all scheduled medical appointments   Interventions:  1:1 collaboration with Dr. Elige Radon Dettinger MD regarding development and update of comprehensive plan of care as evidenced by provider attestation and co-signature LCSW discussed with client dizziness of client . She said she does get dizzy occasionally Discussed energy level of client. She said she is often tired or often feels fatigued. Reviewed sleeping issues of client.  Client said she has reduced sleep.   Reviewed medication procurement with client. Client said she has her prescribed medications at present and is taking medications as prescribed. Reviewed family support.  Client said she has reduced family support. She does get help from her cousin, [name]. He helps her with transport needs, obtaining pharmacy items, helps her obtain groceries. Provided counseling support for client Discussed dental needs of client. She said she is looking for a dentist to help with her dental needs LCSW previously gave client phone number for [facility] in [place], [place].  Client wrote down number and read back to LCSW. She has written down correct number for [facility] and she plans to call them soon to discuss options related to dental care. LCSW also discussed [facility] in [place], [place] as  a possible place for client to call to seek possible dental help . LCSW discussed SW needs of client. LCSW has provided SW support for client for a number of months. LCSW informed client that LCSW would discharge client today from CCM SW Services. However, LCSW discussed Care Coordination program support with client through [facility]. Discussed help in that program of Marval Regal, RN and [employer].  LCSW to ask care guide scheduler to schedule Care Coordination phone call of client with LCSW Danford Bad.  LCSW thanked Customer service manager for participating in [employer] SW program  support LCSW is discharging client today from [employer] SW services  Patient Self Care Activities:  Performs ADL's independently Unable to drive but her cousin assists with transportation  Patient Deficits Some mobility challenges Food issues   Patient Goals: Patient will call LCSW or RNCM as needed in next 30 days for CCM support Patient will attend all scheduled medical appointments in next 30 days Patient will allow time for rest and relaxation in next 30 days  Follow Up Plan:  LCSW  is discharging client today from CCM SW services.      [name].Ranette Luckadoo MSW, LCSW Licensed Visual merchandiser [facility] Care Management [phone]

## 2022-01-15 ENCOUNTER — Encounter: Payer: Self-pay | Admitting: *Deleted

## 2022-01-15 ENCOUNTER — Ambulatory Visit: Payer: Self-pay | Admitting: *Deleted

## 2022-01-15 NOTE — Patient Outreach (Signed)
  Care Coordination   Initial Visit Note   01/15/2022  Name: Pamela Hughes MRN: [id number] DOB: September 02, 1940  Pamela Hughes is a 81 y.o. year old female who sees Dettinger, Elige Radon, MD for primary care. I spoke with [name] by phone today.  What matters to the patients health and wellness today?  No Interventions Indicated.   Goals Addressed   None     SDOH assessments and interventions completed:  Yes.   SDOH Interventions Today    Flowsheet Row Most Recent Value  SDOH Interventions   Food Insecurity Interventions Intervention Not Indicated  Financial Strain Interventions Intervention Not Indicated  Housing Interventions Intervention Not Indicated  Physical Activity Interventions Patient Refused  Stress Interventions Intervention Not Indicated, Offered Community Wellness Resources  Social Connections Interventions Patient Refused  Transportation Interventions Intervention Not Indicated         Care Coordination Interventions Activated:  Yes.   Care Coordination Interventions:  Yes, provided.   Follow up plan: No further intervention required.   Encounter Outcome:  Pt. Visit Completed.    Danford Bad, BSW, MSW, LCSW  Licensed Restaurant manager, [employer] [facility]  Mailing [place] [address] [place], [place] [address] Physical Address[address], [place], [place] [address] Toll Free Main # [phone] Fax # [phone] Cell # [phone] [email or web address]

## 2022-01-15 NOTE — Patient Instructions (Signed)
Visit Information  Thank you for taking time to visit with me today. Please don't hesitate to contact me if I can be of assistance to you.   Please call the care guide team at [phone] if you need to cancel or reschedule your appointment.   If you are experiencing a Mental Health or Behavioral Health Crisis or need someone to talk to, please call the Suicide and Crisis Lifeline: 988 call the [place] National Suicide Prevention Lifeline: 1-800-273-8255 or TTY: 1-800-799-4 TTY (1-800-799-4889) to talk to a trained counselor call 1-800-273-TALK (toll free, 24 hour hotline) go to [facility] [address], [place] ([phone]) call the [place] Crisis Line: 800-939-9988 call 911  Patient verbalizes understanding of instructions and care plan provided today and agrees to view in MyChart. Active MyChart status and patient understanding of how to access instructions and care plan via MyChart confirmed with patient.     No further follow up required.  Khristopher Kapaun, BSW, MSW, LCSW  Licensed Clinical Social Worker  [facility] Care Management [place] [facility]  Mailing Address-[address], [place], [place] [address] Physical Address-[address], [place], [place] [address] Toll Free Main # [phone] Fax # [phone] Cell # [phone] [email or web address]            

## 2022-01-21 ENCOUNTER — Encounter: Payer: Self-pay | Admitting: *Deleted

## 2022-01-21 ENCOUNTER — Ambulatory Visit: Payer: Self-pay | Admitting: *Deleted

## 2022-01-21 NOTE — Patient Instructions (Signed)
Visit Information  Thank you for taking time to visit with me today. Please don't hesitate to contact me if I can be of assistance to you.   Following are the goals we discussed today:   Goals Addressed               This Visit's Progress     Patient Stated     manage vaccines/preventive care (pt-stated)        Care Coordination Interventions: Patient interviewed about adult health maintenance status including  Zostavax COVID vaccination    Advised patient to discuss  Influenza Vaccine with primary care provider         Our next appointment is by telephone on 04/20/22 at 2:30 pm   Please call the care guide team at [phone] if you need to cancel or reschedule your appointment.   If you are experiencing a Mental Health or Behavioral Health Crisis or need someone to talk to, please call the Suicide and Crisis Lifeline: 988 call the [place] National Suicide Prevention Lifeline: 8382848612 or TTY: 912-637-1753 TTY 548-845-0955) to talk to a trained counselor call 1-800-273-TALK (toll free, 24 hour hotline) call the [facility]: 351-435-0851 call 911   The patient verbalized understanding of instructions, educational materials, and care plan provided today and DECLINED offer to receive copy of patient instructions, educational materials, and care plan.   The patient has been provided with contact information for the care management team and has been advised to call with any health related questions or concerns.   SIGNATURE Harshaan Whang L. Noelle Penner, RN, BSN, CCM [facility] Care Coordinator Office number [phone]

## 2022-01-21 NOTE — Patient Outreach (Signed)
  Care Coordination   Initial Visit Note   01/21/2022 Name: VANILLA HEATHERINGTON MRN: [id number] DOB: [birth year]  Ezekiel Slocumb Vath is a 81 y.o. year old female who sees Dettinger, Elige Radon, MD for primary care. I spoke with  [name] by phone today  What matters to the patients health and wellness today?  Denies any medical concerns Denies hypertension is a concern at this time, monitors at home and takes medicine as ordered Care preventive/vaccine Gaps reviewed, [employer] transportation benefit reviewed as she reports she does not go out much but has her cousin, Kathlene November, assist with shopping, appointments +   Goals Addressed               This Visit's Progress     Patient Stated     manage vaccines/preventive care [facility]) (pt-stated)   On track     Care Coordination Interventions: Patient interviewed about adult health maintenance status including  Zostavax COVID vaccination    Advised patient to discuss  Zostavax Influenza Vaccine with primary care provider  She had shingles shot x 1 in April 2023 and pending the second shot, separated by 2 to 6 months  She voices she had all preferred covid vaccines at this time [facility] quality staff updated via e-mail  Encouraged to monitor for hypotensive episodes when she gets dizzy and hold hypertensive medication to prevent further risks if blood pressure is low, then notify her MD      Medical transportation community resource [facility]) (pt-stated)   Not on track     Care Coordination Interventions: Provided education to patient re: her united healthcare medicare transportation resource All questions answered Encourage use. Offered to assist today and/or in future Screening for signs and symptoms of depression related to chronic disease state  Assessed social determinant of health barriers        SDOH assessments and interventions completed:  Yes  SDOH Interventions Today    Flowsheet Row Most Recent Value  SDOH Interventions   Food Insecurity  Interventions Intervention Not Indicated  Financial Strain Interventions Intervention Not Indicated  Transportation Interventions Intervention Not Indicated        Care Coordination Interventions Activated:  Yes  Care Coordination Interventions:  Yes, provided   Follow up plan: Follow up call scheduled for 04/20/22 1430    Encounter Outcome:  Pt. Visit Completed   Chiyo Fay L. Noelle Penner, RN, BSN, CCM [facility] Care Coordinator Office number [phone]

## 2022-01-28 DIAGNOSIS — M19042 Primary osteoarthritis, left hand: Secondary | ICD-10-CM

## 2022-01-28 DIAGNOSIS — I10 Essential (primary) hypertension: Secondary | ICD-10-CM

## 2022-01-28 DIAGNOSIS — E785 Hyperlipidemia, unspecified: Secondary | ICD-10-CM

## 2022-01-28 DIAGNOSIS — M19041 Primary osteoarthritis, right hand: Secondary | ICD-10-CM

## 2022-02-12 ENCOUNTER — Other Ambulatory Visit: Payer: Self-pay | Admitting: Family Medicine

## 2022-03-04 ENCOUNTER — Ambulatory Visit: Payer: Medicare Other | Admitting: Family Medicine

## 2022-03-15 ENCOUNTER — Encounter: Payer: Self-pay | Admitting: Family Medicine

## 2022-03-15 ENCOUNTER — Ambulatory Visit (INDEPENDENT_AMBULATORY_CARE_PROVIDER_SITE_OTHER): Payer: Medicare Other | Admitting: Family Medicine

## 2022-03-15 VITALS — BP 173/76 | HR 58 | Temp 97.2°F | Ht 65.0 in | Wt 136.0 lb

## 2022-03-15 DIAGNOSIS — R413 Other amnesia: Secondary | ICD-10-CM | POA: Diagnosis not present

## 2022-03-15 DIAGNOSIS — E785 Hyperlipidemia, unspecified: Secondary | ICD-10-CM

## 2022-03-15 DIAGNOSIS — F3341 Major depressive disorder, recurrent, in partial remission: Secondary | ICD-10-CM | POA: Diagnosis not present

## 2022-03-15 DIAGNOSIS — I10 Essential (primary) hypertension: Secondary | ICD-10-CM

## 2022-03-15 MED ORDER — DONEPEZIL HCL 5 MG PO TBDP: 5.0000 mg | ORAL_TABLET | Freq: Every day | ORAL | 1 refills | Status: DC

## 2022-03-15 MED ORDER — TRIAMCINOLONE ACETONIDE 0.1 % EX CREA: TOPICAL_CREAM | Freq: Two times a day (BID) | CUTANEOUS | 1 refills | Status: DC

## 2022-03-15 NOTE — Patient Instructions (Signed)
Please check your blood pressure every day for the rest of the week and call us on Friday to let us know what readings you have been getting. Call back [phone].

## 2022-03-15 NOTE — Progress Notes (Signed)
BP (!) 173/76   Pulse (!) 58   Temp (!) 97.2 F (36.2 C)   Ht '5\' 5"'  (1.651 m)   Wt 136 lb (61.7 kg)   SpO2 96%   BMI 22.63 kg/m    Subjective:   Patient ID: Pamela Hughes, female    DOB: 07/10/1940, 81 y.o.   MRN: [id number]  HPI: Pamela Hughes is a 81 y.o. female presenting on 03/15/2022 for Medical Management of Chronic Issues, Hypertension, and Hyperlipidemia   HPI Hypertension Patient is currently on metoprolol and amlodipine, and their blood pressure today is 169/69 and 163/65, she says it runs better at home.. Patient denies any lightheadedness or dizziness. Patient denies headaches, blurred vision, chest pains, shortness of breath, or weakness. Denies any side effects from medication and is content with current medication.   Hyperlipidemia Patient is coming in for recheck of his hyperlipidemia. The patient is currently taking Crestor. They deny any issues with myalgias or history of liver damage from it. They deny any focal numbness or weakness or chest pain.   Depression and anxiety Patient is coming in for depression and anxiety recheck.  She currently takes Wellbutrin says that that that is good for her mood.  She also uses trazodone at night.  She does state the only issue is she is on currently as her memory starting to fade and she has a friend who is taking a memory medicine and she wants to try one because her friend has done well on it.  She does not remember the medicine but she said This is something prescribed from our office.   Relevant past medical, surgical, family and social history reviewed and updated as indicated. Interim medical history since our last visit reviewed. Allergies and medications reviewed and updated.  Review of Systems  Constitutional:  Negative for chills and fever.  Eyes:  Negative for visual disturbance.  Respiratory:  Negative for chest tightness and shortness of breath.   Cardiovascular:  Negative for chest pain and leg swelling.   Genitourinary:  Negative for difficulty urinating and dysuria.  Musculoskeletal:  Negative for back pain and gait problem.  Skin:  Negative for rash.  Neurological:  Negative for dizziness, light-headedness and headaches.  Psychiatric/Behavioral:  Negative for agitation and behavioral problems.   All other systems reviewed and are negative.   Per HPI unless specifically indicated above   Allergies as of 03/15/2022   No Known Allergies      Medication List        Accurate as of March 15, 2022  2:28 PM. If you have any questions, ask your nurse or doctor.          albuterol 108 (90 Base) MCG/ACT inhaler Commonly known as: VENTOLIN HFA USE 2 PUFFS EVERY 6 HOURS AS NEEDED   amLODipine 2.5 MG tablet Commonly known as: NORVASC Take 1 tablet (2.5 mg total) by mouth daily.   buPROPion 150 MG 12 hr tablet Commonly known as: WELLBUTRIN SR Take 1 tablet (150 mg total) by mouth every morning.   donepezil 5 MG disintegrating tablet Commonly known as: ARICEPT ODT Take 1 tablet (5 mg total) by mouth at bedtime. Started by: Fransisca Kaufmann Mort Smelser, MD   ipratropium 0.03 % nasal spray Commonly known as: ATROVENT USE 2 SPRAYS IN EACH NOSTRIL TWICE DAILYAS DIRECTED   meclizine 25 MG tablet Commonly known as: ANTIVERT TAKE ONE TABLET 3 TIMES A DAY AS NEEDED.   meloxicam 15 MG tablet Commonly known as: MOBIC  Take 1 tablet (15 mg total) by mouth daily.   metoprolol succinate 25 MG 24 hr tablet Commonly known as: TOPROL-XL Take 1 tablet (25 mg total) by mouth daily.   omeprazole 20 MG capsule Commonly known as: PRILOSEC TAKE ONE CAPSULE BY MOUTH DAILY   rosuvastatin 10 MG tablet Commonly known as: CRESTOR Take 1 tablet (10 mg total) by mouth daily.   traZODone 50 MG tablet Commonly known as: DESYREL Take 0.5-1 tablets (25-50 mg total) by mouth at bedtime as needed for sleep.   triamcinolone cream 0.1 % Commonly known as: KENALOG Apply topically 2 (two) times daily.          Objective:   BP (!) 173/76   Pulse (!) 58   Temp (!) 97.2 F (36.2 C)   Ht '5\' 5"'  (1.651 m)   Wt 136 lb (61.7 kg)   SpO2 96%   BMI 22.63 kg/m   Wt Readings from Last 3 Encounters:  03/15/22 136 lb (61.7 kg)  09/23/21 141 lb (64 kg)  09/02/21 141 lb (64 kg)    Physical Exam Vitals and nursing note reviewed.  Constitutional:      General: She is not in acute distress.    Appearance: She is well-developed. She is not diaphoretic.  Eyes:     Conjunctiva/sclera: Conjunctivae normal.  Cardiovascular:     Rate and Rhythm: Normal rate and regular rhythm.     Heart sounds: Normal heart sounds. No murmur heard. Pulmonary:     Effort: Pulmonary effort is normal. No respiratory distress.     Breath sounds: Normal breath sounds. No wheezing.  Musculoskeletal:        General: No swelling or tenderness. Normal range of motion.  Skin:    General: Skin is warm and dry.     Findings: No rash.  Neurological:     Mental Status: She is alert and oriented to person, place, and time.     Coordination: Coordination normal.  Psychiatric:        Behavior: Behavior normal.       Assessment & Plan:   Problem List Items Addressed This Visit       Cardiovascular and Mediastinum   Hypertension - Primary   Relevant Orders   CBC with Differential/Platelet   CMP14+EGFR   Lipid panel     Other   Depression   Relevant Orders   CBC with Differential/Platelet   CMP14+EGFR   Lipid panel   Dyslipidemia (high LDL; low HDL)   Relevant Orders   CBC with Differential/Platelet   CMP14+EGFR   Lipid panel   Other Visit Diagnoses     Memory deficit       Relevant Medications   donepezil (ARICEPT ODT) 5 MG disintegrating tablet       We will start memory medicine for the patient.  We will do blood work today.  Blood pressure elevated today but she does get this here, she will monitor blood pressure closely at home.  Also nervous to increase blood pressure medicines because of  how fragile she is so allowing some permissive hypertension.  Follow up plan: Return in about 6 months (around 09/14/2022), or if symptoms worsen or fail to improve, for Memory and hypertension and cholesterol.  Counseling provided for all of the vaccine components Orders Placed This Encounter  Procedures   CBC with Differential/Platelet   CMP14+EGFR   Lipid panel    Caryl Pina, MD [facility] 03/15/2022, 2:28 PM

## 2022-03-16 LAB — CMP14+EGFR
ALT: 16 IU/L (ref 0–32)
AST: 21 IU/L (ref 0–40)
Albumin/Globulin Ratio: 1.7 (ref 1.2–2.2)
Albumin: 4.3 g/dL (ref 3.7–4.7)
Alkaline Phosphatase: 93 IU/L (ref 44–121)
BUN/Creatinine Ratio: 19 (ref 12–28)
BUN: 25 mg/dL (ref 8–27)
Bilirubin Total: 0.4 mg/dL (ref 0.0–1.2)
CO2: 21 mmol/L (ref 20–29)
Calcium: 9.5 mg/dL (ref 8.7–10.3)
Chloride: 100 mmol/L (ref 96–106)
Creatinine, Ser: 1.33 mg/dL — ABNORMAL HIGH (ref 0.57–1.00)
Globulin, Total: 2.5 g/dL (ref 1.5–4.5)
Glucose: 86 mg/dL (ref 70–99)
Potassium: 4 mmol/L (ref 3.5–5.2)
Sodium: 138 mmol/L (ref 134–144)
Total Protein: 6.8 g/dL (ref 6.0–8.5)
eGFR: 40 mL/min/{1.73_m2} — ABNORMAL LOW (ref >59)

## 2022-03-16 LAB — CBC WITH DIFFERENTIAL/PLATELET
Basophils Absolute: 0 10*3/uL (ref 0.0–0.2)
Basos: 0 %
EOS (ABSOLUTE): 0.2 10*3/uL (ref 0.0–0.4)
Eos: 2 %
Hematocrit: 36.1 % (ref 34.0–46.6)
Hemoglobin: 12.9 g/dL (ref 11.1–15.9)
Immature Grans (Abs): 0 10*3/uL (ref 0.0–0.1)
Immature Granulocytes: 0 %
Lymphocytes Absolute: 1.7 10*3/uL (ref 0.7–3.1)
Lymphs: 22 %
MCH: 33.8 pg — ABNORMAL HIGH (ref 26.6–33.0)
MCHC: 35.7 g/dL (ref 31.5–35.7)
MCV: 95 fL (ref 79–97)
Monocytes Absolute: 0.8 10*3/uL (ref 0.1–0.9)
Monocytes: 11 %
Neutrophils Absolute: 4.8 10*3/uL (ref 1.4–7.0)
Neutrophils: 65 %
Platelets: 300 10*3/uL (ref 150–450)
RBC: 3.82 x10E6/uL (ref 3.77–5.28)
RDW: 11.9 % (ref 11.7–15.4)
WBC: 7.6 10*3/uL (ref 3.4–10.8)

## 2022-03-16 LAB — LIPID PANEL
Chol/HDL Ratio: 2 ratio (ref 0.0–4.4)
Cholesterol, Total: 186 mg/dL (ref 100–199)
HDL: 95 mg/dL (ref >39)
LDL Chol Calc (NIH): 78 mg/dL (ref 0–99)
Triglycerides: 68 mg/dL (ref 0–149)
VLDL Cholesterol Cal: 13 mg/dL (ref 5–40)

## 2022-04-20 ENCOUNTER — Ambulatory Visit: Payer: Self-pay | Admitting: *Deleted

## 2022-04-20 NOTE — Patient Outreach (Signed)
  Care Coordination   Follow Up Visit Note   04/20/2022 Name: Pamela Hughes MRN: [id number] DOB: 1941/02/26  Pamela Hughes is a 81 y.o. year old female who sees Dettinger, Elige Radon, MD for primary care. I spoke with  [name] by phone today.  What matters to the patients health and wellness today?  Memory - started on Aricept  She reports only noticing some muscle cramps in her toes or calf of leg  Hypertension No headaches, monitoring sodium intake, not noticed worsening swelling      Goals Addressed               This Visit's Progress     Patient Stated     Manage memory changes [facility]) (pt-stated)   Not on track     Care Coordination Interventions: Evaluation of current treatment plan related to memory changes and patient's adherence to plan as established by provider Reviewed scheduled/upcoming provider appointments including pcp April 2024  Assessed for any noted improvements or worsening symptoms Encouragement provided       manage vaccines/preventive care [facility]) (pt-stated)   On track     Care Coordination Interventions: Patient interviewed about adult health maintenance status including  Zostavax COVID vaccination    Advised patient to discuss  Zostavax Influenza Vaccine with primary care provider  She had shingles shot x 1 in April 2023 and pending the second shot, separated by 2 to 6 months  She voices she had all preferred covid vaccines at this time Assessed for hyper/hypotension episodes - better per patient      Medical transportation community resource [facility]) (pt-stated)   On track     Care Coordination Interventions: Screening for signs and symptoms of depression related to chronic disease state  Inquired if any concerns with medical transportation         SDOH assessments and interventions completed:  No     Care Coordination Interventions Activated:  Yes  Care Coordination Interventions:  Yes, provided   Follow up plan: Follow up call  scheduled for 05/20/22    Encounter Outcome:  Pt. Visit Completed   [name], RN, BSN, CCM [facility] Care Coordinator Office number [phone]

## 2022-04-20 NOTE — Patient Instructions (Addendum)
Visit Information  Thank you for taking time to visit with me today. Please don't hesitate to contact me if I can be of assistance to you.   Following are the goals we discussed today:   Goals Addressed               This Visit's Progress     Patient Stated     Manage memory changes [facility]) (pt-stated)   Not on track     Care Coordination Interventions: Evaluation of current treatment plan related to memory changes and patient's adherence to plan as established by provider Reviewed scheduled/upcoming provider appointments including pcp April 2024  Assessed for any noted improvements or worsening symptoms Encouragement provided       manage vaccines/preventive care [facility]) (pt-stated)   On track     Care Coordination Interventions: Patient interviewed about adult health maintenance status including  Zostavax COVID vaccination    Advised patient to discuss  Zostavax Influenza Vaccine with primary care provider  She had shingles shot x 1 in April 2023 and pending the second shot, separated by 2 to 6 months  She voices she had all preferred covid vaccines at this time Assessed for hyper/hypotension episodes - better per patient      Medical transportation community resource [facility]) (pt-stated)   On track     Care Coordination Interventions: Screening for signs and symptoms of depression related to chronic disease state  Inquired if any concerns with medical transportation         Our next appointment is by telephone on 05/20/22 at 3 pm  Please call the care guide team at [phone] if you need to cancel or reschedule your appointment.   If you are experiencing a Mental Health or Behavioral Health Crisis or need someone to talk to, please call the Suicide and Crisis Lifeline: 988 call the [place] National Suicide Prevention Lifeline: 513-760-7286 or TTY: 551-819-1230 TTY (442)734-1364) to talk to a trained counselor call 1-800-273-TALK (toll free, 24 hour hotline) call the  [facility]: [phone] call 911   Patient verbalizes understanding of instructions and care plan provided today and agrees to view in MyChart. Active MyChart status and patient understanding of how to access instructions and care plan via MyChart confirmed with patient.     The patient has been provided with contact information for the care management team and has been advised to call with any health related questions or concerns.   Helmuth Recupero L. Noelle Penner, RN, BSN, CCM [facility] Care Coordinator Office number [phone]

## 2022-04-28 ENCOUNTER — Other Ambulatory Visit: Payer: Self-pay | Admitting: Family Medicine

## 2022-05-05 DIAGNOSIS — L821 Other seborrheic keratosis: Secondary | ICD-10-CM | POA: Diagnosis not present

## 2022-05-05 DIAGNOSIS — B078 Other viral warts: Secondary | ICD-10-CM | POA: Diagnosis not present

## 2022-05-05 DIAGNOSIS — D224 Melanocytic nevi of scalp and neck: Secondary | ICD-10-CM | POA: Diagnosis not present

## 2022-05-20 ENCOUNTER — Ambulatory Visit: Payer: Self-pay | Admitting: *Deleted

## 2022-05-20 NOTE — Patient Outreach (Addendum)
  Care Coordination   Follow Up Visit Note   05/20/2022 Name: Pamela Hughes MRN: [id number] DOB: 1940-12-29  Pamela Hughes is a 81 y.o. year old female who sees Dettinger, Elige Radon, MD for primary care. I spoke with  Pamela Hughes by phone today.  What matters to the patients health and wellness today?  Follow up on muscle cramps- Pamela Hughes reports her muscle cramps have resolved  She reports her blood pressure remains good   She denies concerns with medical transportation or memory changes today She agrees to follow up in 2024   Goals Addressed               This Visit's Progress     Patient Stated     Manage memory changes [facility]) (pt-stated)   On track     Care Coordination Interventions: Evaluation of current treatment plan related to memory changes and patient's adherence to plan as established by provider Reviewed scheduled/upcoming provider appointments including pcp April 2024  Assessed for any noted improvements or worsening symptoms Encouragement provided       manage vaccines/preventive care [facility]) (pt-stated)   On track     Care Coordination Interventions: Patient interviewed about adult health maintenance status including  Zostavax COVID vaccination    Advised patient to discuss  Zostavax Influenza Vaccine with primary care provider  She had shingles shot x 1 in April 2023 and pending the second shot, separated by 2 to 6 months  She voices she had all preferred covid vaccines at this time Assessed for hyper/hypotension episodes - better per patient      Medical transportation community resource [facility]) (pt-stated)   On track     Care Coordination Interventions: Screening for signs and symptoms of depression related to chronic disease state  Inquired if any concerns with medical transportation         SDOH assessments and interventions completed:  Yes  SDOH Interventions Today    Flowsheet Row Most Recent Value  SDOH Interventions   Food Insecurity  Interventions Intervention Not Indicated  Transportation Interventions Intervention Not Indicated  Utilities Interventions Intervention Not Indicated  Stress Interventions Intervention Not Indicated        Care Coordination Interventions:  Yes, provided   Follow up plan: Follow up call scheduled for 06/21/22/ 3 pm    Encounter Outcome:  Pt. Visit Completed   Keylor Rands L. Noelle Penner, RN, BSN, CCM [facility] Care Coordinator Office number [phone]

## 2022-05-20 NOTE — Patient Outreach (Signed)
  Care Coordination   05/20/2022 Name: NAVI EWTON MRN: [id number] DOB: [birth year]   Care Coordination Outreach Attempts:  An unsuccessful telephone outreach was attempted today to offer the patient information about available care coordination services as a benefit of their health plan.   Follow Up Plan:  Additional outreach attempts will be made to offer the patient care coordination information and services.   Encounter Outcome:  No Answer   Care Coordination Interventions:  No, not indicated    Aleiyah Halpin L. Noelle Penner, RN, BSN, CCM [facility] Care Coordinator Office number [phone]

## 2022-05-20 NOTE — Patient Instructions (Signed)
Visit Information  Thank you for taking time to visit with me today. Please don't hesitate to contact me if I can be of assistance to you.   Following are the goals we discussed today:   Goals Addressed               This Visit's Progress     Patient Stated     Manage memory changes [name]) (pt-stated)   On track     Care Coordination Interventions: Evaluation of current treatment plan related to memory changes and patient's adherence to plan as established by provider Reviewed scheduled/upcoming provider appointments including pcp April 2024  Assessed for any noted improvements or worsening symptoms Encouragement provided       manage vaccines/preventive care [facility]) (pt-stated)   On track     Care Coordination Interventions: Patient interviewed about adult health maintenance status including  Zostavax COVID vaccination    Advised patient to discuss  Zostavax Influenza Vaccine with primary care provider  She had shingles shot x 1 in April 2023 and pending the second shot, separated by 2 to 6 months  She voices she had all preferred covid vaccines at this time Assessed for hyper/hypotension episodes - better per patient      Medical transportation community resource [name]) (pt-stated)   On track     Care Coordination Interventions: Screening for signs and symptoms of depression related to chronic disease state  Inquired if any concerns with medical transportation         Our next appointment is by telephone on 06/21/22 at 3 pm  Please call the care guide team at [phone] if you need to cancel or reschedule your appointment.   If you are experiencing a Mental Health or Behavioral Health Crisis or need someone to talk to, please call the Suicide and Crisis Lifeline: 988 call the [place] National Suicide Prevention Lifeline: 530-061-1909 or TTY: 618-628-6154 TTY 787-089-9994) to talk to a trained counselor call 1-800-273-TALK (toll free, 24 hour hotline) call the [name]: [phone] call 911   Patient verbalizes understanding of instructions and care plan provided today and agrees to view in MyChart. Active MyChart status and patient understanding of how to access instructions and care plan via MyChart confirmed with patient.     The patient has been provided with contact information for the care management team and has been advised to call with any health related questions or concerns.    Hughey Rittenberry L. Noelle Penner, RN, BSN, CCM [facility] Care Coordinator Office number [phone]

## 2022-06-04 ENCOUNTER — Other Ambulatory Visit: Payer: Self-pay | Admitting: Family Medicine

## 2022-06-21 ENCOUNTER — Encounter: Payer: 59 | Admitting: *Deleted

## 2022-07-02 ENCOUNTER — Other Ambulatory Visit: Payer: Self-pay | Admitting: Family Medicine

## 2022-07-02 ENCOUNTER — Ambulatory Visit: Payer: Self-pay

## 2022-07-02 NOTE — Telephone Encounter (Signed)
Last office visit 03/15/22 Last refill 03/15/22 80 grams, 1 refill

## 2022-07-02 NOTE — Patient Outreach (Signed)
  Care Coordination   Follow Up Visit Note   07/02/2022 Name: Pamela Hughes MRN: [id number] DOB: Feb 23, 1941  Pamela Hughes is a 82 y.o. year old female who sees Dettinger, Fransisca Kaufmann, MD for primary care. I spoke with  Pamela Hughes by phone today.  What matters to the patients health and wellness today?   Pamela Hughes continues to maintain positive progress with care plan goals. See below for interventions and patient self-care actives.     Goals Addressed               This Visit's Progress     Manage memory changes [facility]) (pt-stated)        Care Coordination Interventions: Evaluation of current treatment plan related to memory changes and patient's adherence to plan as established by provider Reviewed scheduled/upcoming provider appointments including pcp April 2024  Assessed for any noted improvements or worsening symptoms Encouragement provided  The patient is taking her Aricept as prescribed.  She states that she feels that her memory is something that comes with age and is normal. She was given Meclizine for her dizziness and it is helping but she she has a few dizzy times.  He blood pressure has been running normal with no signs of headache, chest pain , or flushing.         SDOH assessments and interventions completed:  No     Care Coordination Interventions:  Yes, provided   Interventions Today    Flowsheet Row Most Recent Value  Chronic Disease Discussed/Reviewed   Chronic disease discussed/reviewed during today's visit Hypertension (HTN), Other  [Memory]  General Interventions   General Interventions Discussed/Reviewed General Interventions Discussed, General Interventions Reviewed  Education Interventions   Education Provided Provided Verbal Education  Provided Verbal Education On Medication  Safety Interventions   Safety Discussed/Reviewed Safety Discussed, Fall Risk        Follow up plan: Follow up call scheduled for 08/03/22 1 pm    Encounter Outcome:   Pt. Visit Completed   Lazaro Arms RN, BSN, [facility] Care Coordinator CM Registered Nurse Coverage  for  Joellyn Quails RN   Phone: [phone]

## 2022-07-02 NOTE — Patient Instructions (Signed)
Visit Information  Thank you for taking time to visit with me today. Please don't hesitate to contact me if I can be of assistance to you.   Following are the goals we discussed today:   Goals Addressed               This Visit's Progress     Manage memory changes [facility]) (pt-stated)        Care Coordination Interventions: Evaluation of current treatment plan related to memory changes and patient's adherence to plan as established by provider Reviewed scheduled/upcoming provider appointments including pcp April 2024  Assessed for any noted improvements or worsening symptoms Encouragement provided  The patient is taking her Aricept as prescribed.  She states that she feels that her memory is something that comes with age and is normal. She was given Meclizine for her dizziness and it is helping but she she has a few dizzy times.  He blood pressure has been running normal with no signs of headache, chest pain , or flushing.         Our next appointment is by telephone on 08/03/22 at 1 pm  Please call the care guide team at [phone] if you need to cancel or reschedule your appointment.   If you are experiencing a Mental Health or [facility] or need someone to talk to, please call 1-800-273-TALK (toll free, 24 hour hotline)  Patient verbalizes understanding of instructions and care plan provided today and agrees to view in [facility]. Active MyChart status and patient understanding of how to access instructions and care plan via MyChart confirmed with patient.     Lazaro Arms RN, BSN, [facility] Care Coordinator CM Registered Nurse Coverage  for  Joellyn Quails RN   Phone: [phone]

## 2022-07-29 ENCOUNTER — Encounter: Payer: Self-pay | Admitting: Radiology

## 2022-08-03 ENCOUNTER — Encounter: Payer: 59 | Admitting: *Deleted

## 2022-08-10 ENCOUNTER — Ambulatory Visit: Payer: Self-pay | Admitting: *Deleted

## 2022-08-10 NOTE — Patient Outreach (Signed)
  Care Coordination   Follow Up Visit Note   09/14/2022 Name: Pamela Hughes MRN: [id number] DOB: 1940/12/28  Pamela Hughes is a 82 y.o. year old female who sees Dettinger, Elige Radon, MD for primary care. I spoke with  [name] by phone today.  What matters to the patients health and wellness today?  Colitis is her main concern but it is not as bad as it has been when she first was diagnosed She will try bananas, rice and small meals She has not seen a gastroenterologist in a very long time and was on a medicine she can not recall She has been successfully been able to manage with imodium. It is less effective these days     Memory- She has started taking her Aricept, continues to work on puzzles  She confirmed a visit form [employer] nurse who completed a memory test (remembering 3 words)   She was able to remember the 3 words without issues   Goals Addressed               This Visit's Progress     Patient Stated     Manage memory changes [facility]) (pt-stated)   On track     Care Coordination Interventions: Evaluation of current treatment plan related to memory changes and patient's adherence to plan as established by provider Reviewed scheduled/upcoming provider appointments including pcp April 2024  Assessed for any noted improvements or worsening symptoms Encouragement provided  The patient is taking her Aricept as prescribed.  She states that she feels that her memory is something that comes with age and is normal. She was given Meclizine for her dizziness and it is helping but she she has a few dizzy times.  He blood pressure has been running normal with no signs of headache, chest pain , or flushing.       manage vaccines/preventive care [facility]) (pt-stated)   On track     Care Coordination Interventions: Patient interviewed about adult health maintenance status including  Zostavax COVID vaccination    Advised patient to discuss  Zostavax Influenza Vaccine with  primary care provider  She had shingles shot x 1 in April 2023 and pending the second shot, separated by 2 to 6 months  She voices she had all preferred covid vaccines at this time Assessed for hyper/hypotension episodes - better per patient        SDOH assessments and interventions completed:  No     Care Coordination Interventions:  Yes, provided   Follow up plan: Follow up call scheduled for 4/15-16/24    Encounter Outcome:  Pt. Visit Completed   Nyzier Boivin L. Noelle Penner, RN, BSN, CCM [facility] Care Management Community Coordinator Office number [phone]

## 2022-09-01 ENCOUNTER — Other Ambulatory Visit: Payer: Self-pay | Admitting: Family Medicine

## 2022-09-13 ENCOUNTER — Ambulatory Visit: Payer: Medicare Other | Admitting: Family Medicine

## 2022-09-14 ENCOUNTER — Ambulatory Visit: Payer: Self-pay | Admitting: *Deleted

## 2022-09-14 NOTE — Patient Instructions (Signed)
Visit Information  Thank you for taking time to visit with me today. Please don't hesitate to contact me if I can be of assistance to you.   Following are the goals we discussed today:   Goals Addressed               This Visit's Progress     Patient Stated     Manage memory changes [facility]) (pt-stated)   On track     Care Coordination Interventions: Evaluation of current treatment plan related to memory changes and patient's adherence to plan as established by provider Reviewed scheduled/upcoming provider appointments including pcp April 2024  Assessed for any noted improvements or worsening symptoms Encouragement provided  The patient is taking her Aricept as prescribed.  She states that she feels that her memory is something that comes with age and is normal. She was given Meclizine for her dizziness and it is helping but she she has a few dizzy times.  He blood pressure has been running normal with no signs of headache, chest pain , or flushing.       manage vaccines/preventive care [facility]) (pt-stated)   On track     Care Coordination Interventions: Patient interviewed about adult health maintenance status including  Zostavax COVID vaccination    Advised patient to discuss  Zostavax Influenza Vaccine with primary care provider  She had shingles shot x 1 in April 2023 and pending the second shot, separated by 2 to 6 months  She voices she had all preferred covid vaccines at this time Assessed for hyper/hypotension episodes - better per patient        Our next appointment is by telephone on 09/14/22 at  2 pm  Please call the care guide team at [phone] if you need to cancel or reschedule your appointment.   If you are experiencing a Mental Health or Behavioral Health Crisis or need someone to talk to, please call the Suicide and Crisis Lifeline: 988 call the [place] National Suicide Prevention Lifeline: 575-370-7539 or TTY: 579-232-1866 TTY 315-473-5435) to talk to a trained  counselor call 1-800-273-TALK (toll free, 24 hour hotline) call the [facility]: [phone] call 911   Patient verbalizes understanding of instructions and care plan provided today and agrees to view in MyChart. Active MyChart status and patient understanding of how to access instructions and care plan via MyChart confirmed with patient.     The patient has been provided with contact information for the care management team and has been advised to call with any health related questions or concerns.   Ramzy Cappelletti L. Noelle Penner, RN, BSN, CCM [facility] Care Management Community Coordinator Office number [phone]

## 2022-09-14 NOTE — Patient Outreach (Signed)
  Care Coordination   Follow Up Visit Note   09/14/2022 Name: Pamela Hughes MRN: [id number] DOB: [birth year]  Pamela Hughes is a 82 y.o. year old female who sees Dettinger, Elige Radon, MD for primary care. I spoke with  Pamela Hughes by phone today.  What matters to the patients health and wellness today?  Colitis - She reports Imodium is no longer working for her increased diarrhea  lower teeth removed about 2 weeks ago; now eating soft foods, awaiting denture impressions    Goals Addressed               This Visit's Progress     Patient Stated     COMPLETED: Manage memory changes [facility]) (pt-stated)   On track     Care Coordination Interventions: Evaluation of current treatment plan related to memory changes and patient's adherence to plan as established by provider Reviewed scheduled/upcoming provider appointments including pcp April 2024  Assessed for any noted improvements or worsening symptoms Encouragement provided  The patient is taking her Aricept as prescribed.  She states that she feels that her memory is something that comes with age and is normal. She was given Meclizine for her dizziness and it is helping but she she has a few dizzy times.  He blood pressure has been running normal with no signs of headache, chest pain , or flushing.       COMPLETED: Medical transportation community resource [facility]) (pt-stated)   On track     Care Coordination Interventions: Screening for signs and symptoms of depression related to chronic disease state  Inquired if any concerns with medical transportation       [facility] care coordination services (pt-stated)        Interventions Today    Flowsheet Row Most Recent Value  Chronic Disease   Chronic disease during today's visit Other  [colitis, removal of lower teeth]  General Interventions   General Interventions Discussed/Reviewed General Interventions Discussed, Doctor Visits  Doctor Visits Discussed/Reviewed PCP, Doctor Visits Discussed   PCP/Specialist Visits Compliance with follow-up visit  Education Interventions   Education Provided Provided Web-based Education, Provided Education  [Confirms she does not have a computer nor access, Mailed education information on IBS diet, soft foods, food choices to decrease diarrhea]  Provided Verbal Education On Nutrition, Medication  Nutrition Interventions   Nutrition Discussed/Reviewed Nutrition Discussed, Decreasing sugar intake, Adding fruits and vegetables, Decreasing fats  Pharmacy Interventions   Pharmacy Dicussed/Reviewed Pharmacy Topics Discussed, Medications and their functions               SDOH assessments and interventions completed:  No     Care Coordination Interventions:  Yes, provided   Follow up plan: Follow up call scheduled for 10/14/22    Encounter Outcome:  Pt. Visit Completed   Pamela Even L. Noelle Penner, RN, BSN, CCM [facility] Care Management Community Coordinator Office number [phone]

## 2022-09-14 NOTE — Patient Instructions (Addendum)
Visit Information  Thank you for taking time to visit with me today. Please don't hesitate to contact me if I can be of assistance to you.   Following are the goals we discussed today:   Goals Addressed               This Visit's Progress     Patient Stated     COMPLETED: Manage memory changes [facility]) (pt-stated)   On track     Care Coordination Interventions: Evaluation of current treatment plan related to memory changes and patient's adherence to plan as established by provider Reviewed scheduled/upcoming provider appointments including pcp April 2024  Assessed for any noted improvements or worsening symptoms Encouragement provided  The patient is taking her Aricept as prescribed.  She states that she feels that her memory is something that comes with age and is normal. She was given Meclizine for her dizziness and it is helping but she she has a few dizzy times.  He blood pressure has been running normal with no signs of headache, chest pain , or flushing.       COMPLETED: Medical transportation community resource [facility]) (pt-stated)   On track     Care Coordination Interventions: Screening for signs and symptoms of depression related to chronic disease state  Inquired if any concerns with medical transportation       [facility] care coordination services (pt-stated)        Interventions Today    Flowsheet Row Most Recent Value  Chronic Disease   Chronic disease during today's visit Other  [colitis, removal of lower teeth]  General Interventions   General Interventions Discussed/Reviewed General Interventions Discussed, Doctor Visits  Doctor Visits Discussed/Reviewed PCP, Doctor Visits Discussed  PCP/Specialist Visits Compliance with follow-up visit  Education Interventions   Education Provided Provided Web-based Education, Provided Education  [Confirms she does not have a computer nor access, Mailed education information on IBS diet, soft foods, food choices to decrease diarrhea]   Provided Verbal Education On Nutrition, Medication  Nutrition Interventions   Nutrition Discussed/Reviewed Nutrition Discussed, Decreasing sugar intake, Adding fruits and vegetables, Decreasing fats  Pharmacy Interventions   Pharmacy Dicussed/Reviewed Pharmacy Topics Discussed, Medications and their functions               Our next appointment is by telephone on 10/14/22 at 2:15 pm  Please call the care guide team at [phone] if you need to cancel or reschedule your appointment.   If you are experiencing a Mental Health or Behavioral Health Crisis or need someone to talk to, please call the Suicide and Crisis Lifeline: 988 call the [place] National Suicide Prevention Lifeline: 445 796 9731 or TTY: 848 405 8114 TTY 906 229 6520) to talk to a trained counselor call 1-800-273-TALK (toll free, 24 hour hotline) call the [facility]: 867-836-1189 call 911   The patient verbalized understanding of instructions, educational materials, and care plan provided today and DECLINED offer to receive copy of patient instructions, educational materials, and care plan.   The patient has been provided with contact information for the care management team and has been advised to call with any health related questions or concerns.   Tanna Loeffler L. Noelle Penner, RN, BSN, CCM [facility] Care Management Community Coordinator Office number [phone]

## 2022-09-27 ENCOUNTER — Ambulatory Visit (INDEPENDENT_AMBULATORY_CARE_PROVIDER_SITE_OTHER): Payer: 59

## 2022-09-27 VITALS — Ht 65.0 in | Wt 136.0 lb

## 2022-09-27 DIAGNOSIS — Z0000 Encounter for general adult medical examination without abnormal findings: Secondary | ICD-10-CM

## 2022-09-27 NOTE — Progress Notes (Signed)
Subjective:   Pamela Hughes is a 82 y.o. female who presents for Medicare Annual (Subsequent) preventive examination.  I connected with  [name] on 09/27/22 by a audio enabled telemedicine application and verified that I am speaking with the correct person using two identifiers.  Patient Location: Home  Provider Location: Home Office  I discussed the limitations of evaluation and management by telemedicine. The patient expressed understanding and agreed to proceed.  Review of Systems     Cardiac Risk Factors include: advanced age (>27men, >61 women);dyslipidemia;hypertension;sedentary lifestyle     Objective:    Today's Vitals   09/27/22 1210  Weight: 136 lb (61.7 kg)  Height: 5\' 5"  (1.651 m)   Body mass index is 22.63 kg/m.     05/20/2022    3:40 PM 01/15/2022    3:03 PM 09/23/2021    2:12 PM 09/22/2020    3:02 PM 09/20/2019    2:42 PM 12/22/2018    6:59 PM 12/15/2018    5:50 AM  Advanced Directives  Does Patient Have a Medical Advance Directive? No Yes No No No No No  Type of Furniture conservator/restorer;Living will       Does patient want to make changes to medical advance directive?  No - Patient declined       Copy of Healthcare Power of Attorney in Chart?  No - copy requested       Would patient like information on creating a medical advance directive? No - Patient declined  No - Patient declined No - Patient declined No - Patient declined No - Patient declined No - Patient declined    Current Medications (verified) Outpatient Encounter Medications as of 09/27/2022  Medication Sig   albuterol (VENTOLIN HFA) 108 (90 Base) MCG/ACT inhaler USE 2 PUFFS EVERY 6 HOURS AS NEEDED   amLODipine (NORVASC) 2.5 MG tablet Take 1 tablet (2.5 mg total) by mouth daily.   buPROPion (WELLBUTRIN SR) 150 MG 12 hr tablet Take 1 tablet (150 mg total) by mouth every morning.   donepezil (ARICEPT ODT) 5 MG disintegrating tablet Take 1 tablet (5 mg total) by mouth  at bedtime.   ipratropium (ATROVENT) 0.03 % nasal spray USE 2 SPRAYS IN EACH NOSTRIL TWICE DAILYAS DIRECTED   meclizine (ANTIVERT) 25 MG tablet TAKE ONE TABLET 3 TIMES A DAY AS NEEDED.   meloxicam (MOBIC) 15 MG tablet Take 1 tablet (15 mg total) by mouth daily.   metoprolol succinate (TOPROL-XL) 25 MG 24 hr tablet Take 1 tablet (25 mg total) by mouth daily.   omeprazole (PRILOSEC) 20 MG capsule TAKE ONE CAPSULE BY MOUTH DAILY   rosuvastatin (CRESTOR) 10 MG tablet Take 1 tablet (10 mg total) by mouth daily.   traZODone (DESYREL) 50 MG tablet Take 0.5-1 tablets (25-50 mg total) by mouth at bedtime as needed for sleep.   triamcinolone cream (KENALOG) 0.1 % APPLY TWICE DAILY   No facility-administered encounter medications on file as of 09/27/2022.    Allergies (verified) Patient has no known allergies.   History: Past Medical History:  Diagnosis Date   Alopecia    Anxiety    Arthritis    Asthma    Collagenous colitis    Depression    Diarrhea    chronic- "states for at least a year)   Edema    GERD (gastroesophageal reflux disease)    HOH (hard of hearing)    Hyperlipidemia    Hypertension    Past Surgical  History:  Procedure Laterality Date   ABDOMINAL HYSTERECTOMY     one ovary left   CATARACT EXTRACTION W/PHACO Right 02/17/2018   Procedure: CATARACT EXTRACTION PHACO AND INTRAOCULAR LENS PLACEMENT (IOC);  Surgeon: Fabio Pierce, MD;  Location: [facility];  Service: Ophthalmology;  Laterality: Right;  CDE: 4.84   CATARACT EXTRACTION W/PHACO Left 03/31/2018   Procedure: CATARACT EXTRACTION PHACO AND INTRAOCULAR LENS PLACEMENT (IOC) LEFT CDE: 4.51;  Surgeon: Fabio Pierce, MD;  Location: [facility];  Service: Ophthalmology;  Laterality: Left;   COLONOSCOPY N/A 12/17/2016   Procedure: COLONOSCOPY;  Surgeon: Malissa Hippo, MD;  Location: [facility];  Service: Endoscopy;  Laterality: N/A;  2:05   SPINE SURGERY  1990s   L spine   Family History  Problem Relation Age of Onset    Alzheimer's disease Mother    Heart disease Mother    Stroke Mother    Blindness Mother    Alcohol abuse Father    Diabetes Brother    Healthy Daughter    Breast cancer Maternal Aunt    Social History   Socioeconomic History   Marital status: Divorced    Spouse name: Not on file   Number of children: 1   Years of education: 10   Highest education level: 10th grade  Occupational History   Occupation: Retired    Comment: Designer, fashion/clothing  Tobacco Use   Smoking status: Former    Packs/day: 0.25    Years: 5.00    Additional pack years: 0.00    Total pack years: 1.25    Types: Cigarettes    Quit date: 10/09/1974    Years since quitting: 48.0    Passive exposure: Past   Smokeless tobacco: Never  Vaping Use   Vaping Use: Never used  Substance and Sexual Activity   Alcohol use: Yes    Alcohol/week: 2.0 standard drinks of alcohol    Types: 2 Glasses of wine per week   Drug use: No   Sexual activity: Yes    Birth control/protection: Surgical  Other Topics Concern   Not on file  Social History Narrative   Lives alone   One dog    Her daughter lives nearby and checks on her often   Social Determinants of Health   Financial Resource Strain: Low Risk  (09/27/2022)   Overall Financial Resource Strain (CARDIA)    Difficulty of Paying Living Expenses: Not hard at all  Food Insecurity: No Food Insecurity (09/27/2022)   Hunger Vital Sign    Worried About Running Out of Food in the Last Year: Never true    Ran Out of Food in the Last Year: Never true  Transportation Needs: No Transportation Needs (09/27/2022)   PRAPARE - Administrator, Civil Service (Medical): No    Lack of Transportation (Non-Medical): No  Physical Activity: Insufficiently Active (09/27/2022)   Exercise Vital Sign    Days of Exercise per Week: 3 days    Minutes of Exercise per Session: 30 min  Stress: No Stress Concern Present (09/27/2022)   [employer] of Occupational Health - Occupational Stress  Questionnaire    Feeling of Stress : Not at all  Social Connections: Moderately Isolated (09/27/2022)   Social Connection and Isolation Panel [NHANES]    Frequency of Communication with Friends and Family: More than three times a week    Frequency of Social Gatherings with Friends and Family: More than three times a week    Attends Religious Services: More than 4  times per year    Active Member of Clubs or Organizations: No    Attends Banker Meetings: Never    Marital Status: Divorced    Tobacco Counseling Counseling given: Not Answered   Clinical Intake:  Pre-visit preparation completed: Yes  Pain : No/denies pain  Diabetes: No  How often do you need to have someone help you when you read instructions, pamphlets, or other written materials from your doctor or pharmacy?: 1 - Never  Diabetic?No   Interpreter Needed?: No  Information entered by :: Kandis Fantasia LPN   Activities of Daily Living    09/27/2022   12:42 PM 01/15/2022    3:02 PM  In your present state of health, do you have any difficulty performing the following activities:  Hearing? 0 0  Vision? 0 0  Difficulty concentrating or making decisions? 1 0  Walking or climbing stairs? 0 1  Comment  Unsteady Balance/Gait  Dressing or bathing? 0 0  Doing errands, shopping? 0 0  Preparing Food and eating ? N N  Using the Toilet? N N  In the past six months, have you accidently leaked urine? N N  Do you have problems with loss of bowel control? N N  Managing your Medications? N N  Managing your Finances? N N  Housekeeping or managing your Housekeeping? N N    Patient Care Team: Dettinger, Elige Radon, MD as PCP - General (Family Medicine) Rollene Rotunda, MD as PCP - Cardiology (Cardiology) Malissa Hippo, MD (Inactive) as Consulting Physician (Gastroenterology) Darreld Mclean, MD as Attending Physician (Orthopedic Surgery) Clinton Gallant, RN as [facility] Care  Management  Indicate any recent Medical Services you may have received from other than [facility] providers in the past year (date may be approximate).     Assessment:   This is a routine wellness examination for Arretta.  Hearing/Vision screen Hearing Screening - Comments:: HOH; doesn't wear hearing aids  Vision Screening - Comments:: No vision problems; will schedule routine eye exam soon    Dietary issues and exercise activities discussed: Current Exercise Habits: Home exercise routine, Type of exercise: walking, Time (Minutes): 30, Frequency (Times/Week): 3, Weekly Exercise (Minutes/Week): 90, Intensity: Mild   Goals Addressed               This Visit's Progress     COMPLETED: Manage My Emotions        COMPLETED: manage vaccines/preventive care [facility]) (pt-stated)        Care Coordination Interventions: Patient interviewed about adult health maintenance status including  Zostavax COVID vaccination    Advised patient to discuss  Zostavax Influenza Vaccine with primary care provider  She had shingles shot x 1 in April 2023 and pending the second shot, separated by 2 to 6 months  She voices she had all preferred covid vaccines at this time Assessed for hyper/hypotension episodes - better per patient      COMPLETED: Protect My Health;Manage Depression symptoms        Timeframe:  Short-Term Goal Priority:  Medium Progress: On Track Start Date:            08/20/21                Expected End Date:        02/22/22                 Follow Up Date  LCSW is discharging client today from CCM SW services  Protect My Health (  Patient) Manage Depression symptoms    Why is this important?   Screening tests can find diseases early when they are easier to treat.  Your doctor or nurse will talk with you about which tests are important for you.  Getting shots for common diseases like the flu and shingles will help prevent them.     Patient Self Care Activities:  Performs ADL's  independently Unable to drive but her cousin assists with transportation  Patient Deficits Some mobility challenges Food issues   Patient Goals: Patient will call LCSW or RNCM as needed in next 30 days for CCM support Patient will attend all scheduled medical appointments in next 30 days Patient will allow time for rest and relaxation in next 30 days  Follow Up Plan:  LCSW is discharging client today from CCM SW services       Remain active and independent        COMPLETED: [facility] care coordination services (pt-stated)        Interventions Today    Flowsheet Row Most Recent Value  Chronic Disease   Chronic disease during today's visit Other  [colitis, removal of lower teeth]  General Interventions   General Interventions Discussed/Reviewed General Interventions Discussed, Doctor Visits  Doctor Visits Discussed/Reviewed PCP, Doctor Visits Discussed  PCP/Specialist Visits Compliance with follow-up visit  Education Interventions   Education Provided Provided Web-based Education, Provided Education  [Confirms she does not have a computer nor access, Mailed education information on IBS diet, soft foods, food choices to decrease diarrhea]  Provided Verbal Education On Nutrition, Medication  Nutrition Interventions   Nutrition Discussed/Reviewed Nutrition Discussed, Decreasing sugar intake, Adding fruits and vegetables, Decreasing fats  Pharmacy Interventions   Pharmacy Dicussed/Reviewed Pharmacy Topics Discussed, Medications and their functions             Depression Screen    09/27/2022   12:41 PM 03/15/2022    1:22 PM 01/21/2022    2:18 PM 01/15/2022    2:58 PM 12/29/2021   10:16 AM 11/23/2021   12:26 PM 10/14/2021    3:10 PM  PHQ 2/9 Scores  PHQ - 2 Score 0 0 1 1 2 2 2   PHQ- 9 Score  2   8 8 9     Fall Risk    09/27/2022   12:41 PM 03/15/2022    1:22 PM 01/21/2022    2:13 PM 01/15/2022    3:01 PM 09/23/2021    2:01 PM  Fall Risk   Falls in the past year? 0 0 0 0 0   Number falls in past yr: 0  0 0 0  Injury with Fall? 0  0 0 0  Risk for fall due to : No Fall Risks  No Fall Risks No Fall Risks Orthopedic patient  Follow up Falls prevention discussed;Education provided;Falls evaluation completed  Falls evaluation completed Falls evaluation completed;Education provided;Falls prevention discussed Falls prevention discussed    FALL RISK PREVENTION PERTAINING TO THE HOME:  Any stairs in or around the home? No  If so, are there any without handrails? No  Home free of loose throw rugs in walkways, pet beds, electrical cords, etc? Yes  Adequate lighting in your home to reduce risk of falls? Yes   ASSISTIVE DEVICES UTILIZED TO PREVENT FALLS:  Life alert? No  Use of a cane, walker or w/c? No  Grab bars in the bathroom? Yes  Shower chair or bench in shower? No  Elevated toilet seat or a handicapped toilet? Yes  TIMED UP AND GO:  Was the test performed? No . Telephonic visit   Cognitive Function:    06/06/2017   11:21 AM  MMSE - Mini Mental State Exam  Orientation to time 5  Orientation to Place 5  Registration 3  Attention/ Calculation 5  Recall 3  Language- name 2 objects 2  Language- repeat 1  Language- follow 3 step command 2  Language- read & follow direction 1  Write a sentence 1  Copy design 0  Total score 28        09/27/2022   12:42 PM 09/23/2021    2:06 PM 09/22/2020    3:03 PM 09/20/2019    2:47 PM  6CIT Screen  What Year? 0 points 0 points 0 points 0 points  What month? 0 points 0 points 0 points 0 points  What time? 0 points 0 points 0 points 0 points  Count back from 20 0 points 0 points 0 points 0 points  Months in reverse 2 points 4 points 0 points 2 points  Repeat phrase 4 points 2 points 2 points 0 points  Total Score 6 points 6 points 2 points 2 points    Immunizations Immunization History  Administered Date(s) Administered   Fluad Quad(high Dose 65+) 02/25/2020, 03/04/2021   Influenza, High Dose Seasonal PF  02/25/2016, 03/03/2017, 03/21/2018, 03/22/2019   Influenza,inj,Quad PF,6+ Mos 02/27/2015   Influenza-Unspecified 03/18/2014   Moderna Sars-Covid-2 Vaccination 09/25/2019, 11/12/2019   Pneumococcal Polysaccharide-23 02/25/2020   Tdap 02/25/2020   Zoster Recombinat (Shingrix) 09/02/2021   Zoster, Unspecified 09/02/2021    TDAP status: Up to date  Flu Vaccine status: Up to date  Pneumococcal vaccine status: Due, Education has been provided regarding the importance of this vaccine. Advised may receive this vaccine at local pharmacy or Health Dept. Aware to provide a copy of the vaccination record if obtained from local pharmacy or Health Dept. Verbalized acceptance and understanding.  Covid-19 vaccine status: Information provided on how to obtain vaccines.   Qualifies for Shingles Vaccine? Yes   Zostavax completed No   Shingrix Completed?: No.    Education has been provided regarding the importance of this vaccine. Patient has been advised to call insurance company to determine out of pocket expense if they have not yet received this vaccine. Advised may also receive vaccine at local pharmacy or Health Dept. Verbalized acceptance and understanding.  Screening Tests Health Maintenance  Topic Date Due   Pneumonia Vaccine 15+ Years old (2 of 2 - PCV) 02/24/2021   Zoster Vaccines- Shingrix (2 of 2) 10/28/2021   COVID-19 Vaccine (3 - 2023-24 season) 01/29/2022   MAMMOGRAM  03/28/2022   INFLUENZA VACCINE  12/30/2022   DEXA SCAN  03/05/2023   Medicare Annual Wellness (AWV)  09/27/2023   DTaP/Tdap/Td (2 - Td or Tdap) 02/24/2030   HPV VACCINES  Aged Out    Health Maintenance  Health Maintenance Due  Topic Date Due   Pneumonia Vaccine 84+ Years old (2 of 2 - PCV) 02/24/2021   Zoster Vaccines- Shingrix (2 of 2) 10/28/2021   COVID-19 Vaccine (3 - 2023-24 season) 01/29/2022   MAMMOGRAM  03/28/2022    Colorectal cancer screening: No longer required.   Mammogram status: No longer  required due to age.  Bone Density status: Completed 03/04/21. Results reflect: Bone density results: OSTEOPENIA. Repeat every 2 years.  Lung Cancer Screening: (Low Dose CT Chest recommended if Age 100-80 years, 30 pack-year currently smoking OR have quit w/in 15years.) does not qualify.  Lung Cancer Screening Referral: n/a  Additional Screening:  Hepatitis C Screening: does not qualify  Vision Screening: Recommended annual ophthalmology exams for early detection of glaucoma and other disorders of the eye. Is the patient up to date with their annual eye exam?  No  Who is the provider or what is the name of the office in which the patient attends annual eye exams? None  If pt is not established with a provider, would they like to be referred to a provider to establish care? No .   Dental Screening: Recommended annual dental exams for proper oral hygiene  Community Resource Referral / Chronic Care Management: CRR required this visit?  No   CCM required this visit?   Currently receiving services      Plan:     I have personally reviewed and noted the following in the patient's chart:   Medical and social history Use of alcohol, tobacco or illicit drugs  Current medications and supplements including opioid prescriptions. Patient is not currently taking opioid prescriptions. Functional ability and status Nutritional status Physical activity Advanced directives List of other physicians Hospitalizations, surgeries, and ER visits in previous 12 months Vitals Screenings to include cognitive, depression, and falls Referrals and appointments  In addition, I have reviewed and discussed with patient certain preventive protocols, quality metrics, and best practice recommendations. A written personalized care plan for preventive services as well as general preventive health recommendations were provided to patient.     Durwin Nora, [place]   1/61/0960   Due to this being a  virtual visit, the after visit summary with patients personalized plan was offered to patient via mail or my-chart. per request, patient was mailed a copy of AVS.  Nurse Notes: No concerns

## 2022-09-27 NOTE — Patient Instructions (Signed)
Pamela Hughes , Thank you for taking time to come for your Medicare Wellness Visit. I appreciate your ongoing commitment to your health goals. Please review the following plan we discussed and let me know if I can assist you in the future.   These are the goals we discussed:  Goals      Exercise 150 min/wk Moderate Activity     Do chair exercises daily. Refer to handout.      Prevent falls     Goals Addressed               This Visit's Progress     "I need to start exercising and I don't want to fall" (pt-stated)   On track     Current Barriers:  Knowledge Deficits related to physiological changes associated with CVA that may increase dizziness and affect balance   Care management needs related to dizziness in a patient with HTN and a hx of CVA transportatoin  Nurse Case Manager Clinical Goal(s):  Over the next 30 days, patient will use assistive devices (walker) to help with balance and reduce fall risk Over the next 90 days, patient will keep all scheduled medical appointments  Interventions:  Chart reviewed including recent office notes and lab results Discussed lab results with patient Previously provided education to patient re: Fall risks in the home and risk reduction techniques. Reviewed medications with patient  Discussed assistive devices: walker and shower chair Discussed current physical activity level and need to safely increase physical activity level Recommended [facility] programs at [facility] RNCM will mail patient information on current Senior Center programs Discussed walking at walking track close to patient's home Provided with CCM contact information and encouraged to reach out as needed Encouraged patient to reach out to PCP with any new or worsening symptoms  Patient Self Care Activities:  Performs ADL's independently Unable to drive but her cousin assists with transportation  Please see past updates related to this goal by  clicking on the "Past Updates" button in the selected goal         Exercise 150 min/wk Moderate Activity   Not on track     Do chair exercises daily. Refer to handout.        Prevent falls   On track           Remain active and independent        This is a list of the screening recommended for you and due dates:  Health Maintenance  Topic Date Due   Pneumonia Vaccine (2 of 2 - PCV) 02/24/2021   Zoster (Shingles) Vaccine (2 of 2) 10/28/2021   COVID-19 Vaccine (3 - 2023-24 season) 01/29/2022   Mammogram  03/28/2022   Flu Shot  12/30/2022   DEXA scan (bone density measurement)  03/05/2023   Medicare Annual Wellness Visit  09/27/2023   DTaP/Tdap/Td vaccine (2 - Td or Tdap) 02/24/2030   HPV Vaccine  Aged Out    Advanced directives: Please bring a copy of your health care power of attorney and living will to the office to be added to your chart at your convenience.   Conditions/risks identified: Aim for 30 minutes of exercise or brisk walking, 6-8 glasses of water, and 5 servings of fruits and vegetables each day.   Next appointment: Follow up in one year for your annual wellness visit    Preventive Care 65 Years and Older, Female Preventive care refers to lifestyle choices and visits with your health  care provider that can promote health and wellness. What does preventive care include? A yearly physical exam. This is also called an annual well check. Dental exams once or twice a year. Routine eye exams. Ask your health care provider how often you should have your eyes checked. Personal lifestyle choices, including: Daily care of your teeth and gums. Regular physical activity. Eating a healthy diet. Avoiding tobacco and drug use. Limiting alcohol use. Practicing safe sex. Taking low-dose aspirin every day. Taking vitamin and mineral supplements as recommended by your health care provider. What happens during an annual well check? The services and screenings done by  your health care provider during your annual well check will depend on your age, overall health, lifestyle risk factors, and family history of disease. Counseling  Your health care provider may ask you questions about your: Alcohol use. Tobacco use. Drug use. Emotional well-being. Home and relationship well-being. Sexual activity. Eating habits. History of falls. Memory and ability to understand (cognition). Work and work Astronomer. Reproductive health. Screening  You may have the following tests or measurements: Height, weight, and BMI. Blood pressure. Lipid and cholesterol levels. These may be checked every 5 years, or more frequently if you are over 51 years old. Skin check. Lung cancer screening. You may have this screening every year starting at age 53 if you have a 30-pack-year history of smoking and currently smoke or have quit within the past 15 years. Fecal occult blood test (FOBT) of the stool. You may have this test every year starting at age 66. Flexible sigmoidoscopy or colonoscopy. You may have a sigmoidoscopy every 5 years or a colonoscopy every 10 years starting at age 52. Hepatitis C blood test. Hepatitis B blood test. Sexually transmitted disease (STD) testing. Diabetes screening. This is done by checking your blood sugar (glucose) after you have not eaten for a while (fasting). You may have this done every 1-3 years. Bone density scan. This is done to screen for osteoporosis. You may have this done starting at age 32. Mammogram. This may be done every 1-2 years. Talk to your health care provider about how often you should have regular mammograms. Talk with your health care provider about your test results, treatment options, and if necessary, the need for more tests. Vaccines  Your health care provider may recommend certain vaccines, such as: Influenza vaccine. This is recommended every year. Tetanus, diphtheria, and acellular pertussis (Tdap, Td) vaccine. You  may need a Td booster every 10 years. Zoster vaccine. You may need this after age 32. Pneumococcal 13-valent conjugate (PCV13) vaccine. One dose is recommended after age 78. Pneumococcal polysaccharide (PPSV23) vaccine. One dose is recommended after age 40. Talk to your health care provider about which screenings and vaccines you need and how often you need them. This information is not intended to replace advice given to you by your health care provider. Make sure you discuss any questions you have with your health care provider. Document Released: 06/13/2015 Document Revised: 02/04/2016 Document Reviewed: 03/18/2015 Elsevier Interactive Patient Education  2017 [employer].  Fall Prevention in the Home Falls can cause injuries. They can happen to people of all ages. There are many things you can do to make your home safe and to help prevent falls. What can I do on the outside of my home? Regularly fix the edges of walkways and driveways and fix any cracks. Remove anything that might make you trip as you walk through a door, such as a raised step or  threshold. Trim any bushes or trees on the path to your home. Use bright outdoor lighting. Clear any walking paths of anything that might make someone trip, such as rocks or tools. Regularly check to see if handrails are loose or broken. Make sure that both sides of any steps have handrails. Any raised decks and porches should have guardrails on the edges. Have any leaves, snow, or ice cleared regularly. Use sand or salt on walking paths during winter. Clean up any spills in your garage right away. This includes oil or grease spills. What can I do in the bathroom? Use night lights. Install grab bars by the toilet and in the tub and shower. Do not use towel bars as grab bars. Use non-skid mats or decals in the tub or shower. If you need to sit down in the shower, use a plastic, non-slip stool. Keep the floor dry. Clean up any water that spills  on the floor as soon as it happens. Remove soap buildup in the tub or shower regularly. Attach bath mats securely with double-sided non-slip rug tape. Do not have throw rugs and other things on the floor that can make you trip. What can I do in the bedroom? Use night lights. Make sure that you have a light by your bed that is easy to reach. Do not use any sheets or blankets that are too big for your bed. They should not hang down onto the floor. Have a firm chair that has side arms. You can use this for support while you get dressed. Do not have throw rugs and other things on the floor that can make you trip. What can I do in the kitchen? Clean up any spills right away. Avoid walking on wet floors. Keep items that you use a lot in easy-to-reach places. If you need to reach something above you, use a strong step stool that has a grab bar. Keep electrical cords out of the way. Do not use floor polish or wax that makes floors slippery. If you must use wax, use non-skid floor wax. Do not have throw rugs and other things on the floor that can make you trip. What can I do with my stairs? Do not leave any items on the stairs. Make sure that there are handrails on both sides of the stairs and use them. Fix handrails that are broken or loose. Make sure that handrails are as long as the stairways. Check any carpeting to make sure that it is firmly attached to the stairs. Fix any carpet that is loose or worn. Avoid having throw rugs at the top or bottom of the stairs. If you do have throw rugs, attach them to the floor with carpet tape. Make sure that you have a light switch at the top of the stairs and the bottom of the stairs. If you do not have them, ask someone to add them for you. What else can I do to help prevent falls? Wear shoes that: Do not have high heels. Have rubber bottoms. Are comfortable and fit you well. Are closed at the toe. Do not wear sandals. If you use a stepladder: Make  sure that it is fully opened. Do not climb a closed stepladder. Make sure that both sides of the stepladder are locked into place. Ask someone to hold it for you, if possible. Clearly mark and make sure that you can see: Any grab bars or handrails. First and last steps. Where the edge of each step is.  Use tools that help you move around (mobility aids) if they are needed. These include: Canes. Walkers. Scooters. Crutches. Turn on the lights when you go into a dark area. Replace any light bulbs as soon as they burn out. Set up your furniture so you have a clear path. Avoid moving your furniture around. If any of your floors are uneven, fix them. If there are any pets around you, be aware of where they are. Review your medicines with your doctor. Some medicines can make you feel dizzy. This can increase your chance of falling. Ask your doctor what other things that you can do to help prevent falls. This information is not intended to replace advice given to you by your health care provider. Make sure you discuss any questions you have with your health care provider. Document Released: 03/13/2009 Document Revised: 10/23/2015 Document Reviewed: 06/21/2014 Elsevier Interactive Patient Education  2017 [employer].

## 2022-10-14 ENCOUNTER — Ambulatory Visit: Payer: 59 | Admitting: *Deleted

## 2022-10-14 NOTE — Patient Outreach (Signed)
  Care Coordination   Follow Up Visit Note   08/23/2023 updated entry for 10/14/22 Name: Pamela Hughes MRN: [id number] DOB: June 25, 1940  Pamela Hughes is a 82 y.o. year old female who sees Dettinger, Elige Radon, MD for primary care. I spoke with  Pamela Hughes by phone today.  What matters to the patients health and wellness today?  Colitis better per patient She is now at 3 times a day and she is able to make it to the bathroom   Has not had teeth impression yet Pamela Hughes  [phone] called with her to schedule this visit Hearing aid needed and can't afford them got previous ones from [facility] [place]  Goals Addressed               This Visit's Progress     Patient Stated     [name] care coordination services (pt-stated)   On track     Interventions Today    Flowsheet Row Most Recent Value  Chronic Disease   Chronic disease during today's visit Other  [Hughes services for Hughes impression, colitis]  General Interventions   General Interventions Discussed/Reviewed General Interventions Reviewed, [name], Doctor Visits  Doctor Visits Discussed/Reviewed Doctor Visits Reviewed, Specialist, PCP  PCP/Specialist Visits Compliance with follow-up visit  Education Interventions   Education Provided Provided Education  Provided Verbal Education On Scientist, research (life sciences) with Hughes and audiology providers/visits]  Mental Health Interventions   Mental Health Discussed/Reviewed Mental Health Discussed, Coping Strategies              SDOH assessments and interventions completed:  No     Care Coordination Interventions:  Yes, provided   Follow up plan: No further intervention required.   Encounter Outcome:  Pt. Visit Completed   Pamela Hughes L. Noelle Penner, RN, BSN, CCM [facility] Care Management Community Coordinator Office number [phone]

## 2022-10-15 ENCOUNTER — Other Ambulatory Visit: Payer: Self-pay | Admitting: Family Medicine

## 2022-10-26 ENCOUNTER — Other Ambulatory Visit: Payer: Self-pay | Admitting: Family Medicine

## 2022-10-26 DIAGNOSIS — I10 Essential (primary) hypertension: Secondary | ICD-10-CM

## 2022-10-28 ENCOUNTER — Other Ambulatory Visit: Payer: Self-pay | Admitting: Family Medicine

## 2022-10-28 DIAGNOSIS — E785 Hyperlipidemia, unspecified: Secondary | ICD-10-CM

## 2022-11-10 ENCOUNTER — Encounter: Payer: Self-pay | Admitting: Family Medicine

## 2022-11-10 ENCOUNTER — Ambulatory Visit (INDEPENDENT_AMBULATORY_CARE_PROVIDER_SITE_OTHER): Payer: 59 | Admitting: Family Medicine

## 2022-11-10 VITALS — BP 165/60 | HR 51 | Ht 65.0 in | Wt 135.0 lb

## 2022-11-10 DIAGNOSIS — K52831 Collagenous colitis: Secondary | ICD-10-CM

## 2022-11-10 DIAGNOSIS — H9190 Unspecified hearing loss, unspecified ear: Secondary | ICD-10-CM | POA: Insufficient documentation

## 2022-11-10 DIAGNOSIS — G4700 Insomnia, unspecified: Secondary | ICD-10-CM | POA: Diagnosis not present

## 2022-11-10 DIAGNOSIS — H9193 Unspecified hearing loss, bilateral: Secondary | ICD-10-CM | POA: Diagnosis not present

## 2022-11-10 DIAGNOSIS — R413 Other amnesia: Secondary | ICD-10-CM | POA: Diagnosis not present

## 2022-11-10 DIAGNOSIS — Z1231 Encounter for screening mammogram for malignant neoplasm of breast: Secondary | ICD-10-CM

## 2022-11-10 DIAGNOSIS — F32A Depression, unspecified: Secondary | ICD-10-CM

## 2022-11-10 DIAGNOSIS — E785 Hyperlipidemia, unspecified: Secondary | ICD-10-CM | POA: Diagnosis not present

## 2022-11-10 DIAGNOSIS — I10 Essential (primary) hypertension: Secondary | ICD-10-CM

## 2022-11-10 DIAGNOSIS — Z23 Encounter for immunization: Secondary | ICD-10-CM

## 2022-11-10 MED ORDER — AMLODIPINE BESYLATE 2.5 MG PO TABS: 2.5000 mg | ORAL_TABLET | Freq: Every day | ORAL | 3 refills | Status: DC

## 2022-11-10 MED ORDER — BUDESONIDE ER 9 MG PO TB24
9.0000 mg | ORAL_TABLET | Freq: Every day | ORAL | 0 refills | Status: AC
Start: 2022-11-10 — End: ?

## 2022-11-10 MED ORDER — TRIAMCINOLONE ACETONIDE 0.1 % EX CREA: TOPICAL_CREAM | Freq: Two times a day (BID) | CUTANEOUS | 1 refills | Status: DC

## 2022-11-10 MED ORDER — MELOXICAM 15 MG PO TABS: 15.0000 mg | ORAL_TABLET | Freq: Every day | ORAL | 5 refills | Status: DC

## 2022-11-10 MED ORDER — TRAZODONE HCL 50 MG PO TABS
25.0000 mg | ORAL_TABLET | Freq: Every evening | ORAL | 3 refills | Status: DC | PRN
Start: 2022-11-10 — End: 2023-08-15

## 2022-11-10 MED ORDER — BUPROPION HCL ER (SR) 150 MG PO TB12: 150.0000 mg | ORAL_TABLET | Freq: Every morning | ORAL | 3 refills | Status: DC

## 2022-11-10 MED ORDER — METOPROLOL SUCCINATE ER 25 MG PO TB24: 25.0000 mg | ORAL_TABLET | Freq: Every day | ORAL | 3 refills | Status: DC

## 2022-11-10 MED ORDER — OMEPRAZOLE 20 MG PO CPDR: 20.0000 mg | DELAYED_RELEASE_CAPSULE | Freq: Every day | ORAL | 3 refills | Status: DC

## 2022-11-10 MED ORDER — DONEPEZIL HCL 5 MG PO TBDP: 5.0000 mg | ORAL_TABLET | Freq: Every day | ORAL | 3 refills | Status: DC

## 2022-11-10 MED ORDER — ROSUVASTATIN CALCIUM 10 MG PO TABS: 10.0000 mg | ORAL_TABLET | Freq: Every day | ORAL | 3 refills | Status: DC

## 2022-11-10 NOTE — Addendum Note (Signed)
Addended by: Dorene Sorrow on: 11/10/2022 02:07 PM   Modules accepted: Orders

## 2022-11-10 NOTE — Progress Notes (Addendum)
BP (!) 165/60   Pulse (!) 51   Ht 5\' 5"  (1.651 m)   Wt 135 lb (61.2 kg)   SpO2 96%   BMI 22.47 kg/m    Subjective:   Patient ID: Pamela Hughes, female    DOB: 1941-03-02, 82 y.o.   MRN: [id number]  HPI: Pamela Hughes is a 82 y.o. female presenting on 11/10/2022 for Medical Management of Chronic Issues and Hypertension   HPI Hypertension Patient is currently on amlodipine and metoprolol, and their blood pressure today is 165/60. Patient denies any lightheadedness or dizziness. Patient denies headaches, blurred vision, chest pains, shortness of breath, or weakness. Denies any side effects from medication and is content with current medication.   Hyperlipidemia Patient is coming in for recheck of his hyperlipidemia. The patient is currently taking Crestor. They deny any issues with myalgias or history of liver damage from it. They deny any focal numbness or weakness or chest pain.   Patient's biggest complaint today is that she has been having some irritable bowel issues that do cause some diarrhea.  She says she will have 2-3 episodes of diarrhea in the morning and then sometimes another 1 throughout the day.  She says she has been having this for 6 years and says is nothing new and that she is seeing gastroenterology before but just wonders if There is something that can help with a little bit better than what she has currently.  Currently she is using Imodium and it used to help a lot but she says it is not helping as much.  Depression and memory recheck Patient is coming in today for depression recheck.  She is currently taking Wellbutrin and donepezil to help with memory and trazodone to help with sleep.  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    11/10/2022    1:05 PM 09/27/2022   12:41 PM 03/15/2022    1:22 PM 01/21/2022    2:18 PM 01/15/2022    2:58 PM  Depression screen PHQ 2/9  Decreased Interest 0 0 0 1 1  Down, Depressed, Hopeless 2 0 0 0 0  PHQ - 2  Score 2 0 0 1 1  Altered sleeping 3  0    Tired, decreased energy 0  2    Change in appetite 0  0    Feeling bad or failure about yourself  0  0    Trouble concentrating 0  0    Moving slowly or fidgety/restless 0  0    Suicidal thoughts 0  0    PHQ-9 Score 5  2    Difficult doing work/chores Not difficult at all  Not difficult at all       Relevant past medical, surgical, family and social history reviewed and updated as indicated. Interim medical history since our last visit reviewed. Allergies and medications reviewed and updated.  Review of Systems  Constitutional:  Negative for chills and fever.  Eyes:  Negative for visual disturbance.  Respiratory:  Negative for chest tightness and shortness of breath.   Cardiovascular:  Negative for chest pain and leg swelling.  Gastrointestinal:  Positive for abdominal distention and diarrhea. Negative for abdominal pain, blood in stool, constipation, nausea and vomiting.  Musculoskeletal:  Negative for back pain and gait problem.  Skin:  Negative for rash.  Neurological:  Negative for light-headedness and headaches.  Psychiatric/Behavioral:  Positive for sleep disturbance. Negative for agitation, behavioral problems and dysphoric mood. The patient  is not nervous/anxious.   All other systems reviewed and are negative.   Per HPI unless specifically indicated above   Allergies as of 11/10/2022   No Known Allergies      Medication List        Accurate as of November 10, 2022  1:26 PM. If you have any questions, ask your nurse or doctor.          albuterol 108 (90 Base) MCG/ACT inhaler Commonly known as: VENTOLIN HFA USE 2 PUFFS EVERY 6 HOURS AS NEEDED   amLODipine 2.5 MG tablet Commonly known as: NORVASC Take 1 tablet (2.5 mg total) by mouth daily. What changed: additional instructions Changed by: Nils Pyle, MD   Budesonide ER 9 MG Tb24 Commonly known as: Uceris Take 1 tablet (9 mg total) by mouth daily. Started by:  Nils Pyle, MD   buPROPion 150 MG 12 hr tablet Commonly known as: WELLBUTRIN SR Take 1 tablet (150 mg total) by mouth every morning.   donepezil 5 MG disintegrating tablet Commonly known as: ARICEPT ODT Take 1 tablet (5 mg total) by mouth at bedtime. What changed: Another medication with the same name was removed. Continue taking this medication, and follow the directions you see here. Changed by: Elige Radon Levi Klaiber, MD   ipratropium 0.03 % nasal spray Commonly known as: ATROVENT USE 2 SPRAYS IN EACH NOSTRIL TWICE DAILYAS DIRECTED   meclizine 25 MG tablet Commonly known as: ANTIVERT TAKE ONE TABLET 3 TIMES A DAY AS NEEDED.   meloxicam 15 MG tablet Commonly known as: MOBIC Take 1 tablet (15 mg total) by mouth daily.   metoprolol succinate 25 MG 24 hr tablet Commonly known as: TOPROL-XL Take 1 tablet (25 mg total) by mouth daily.   omeprazole 20 MG capsule Commonly known as: PRILOSEC Take 1 capsule (20 mg total) by mouth daily. What changed: additional instructions Changed by: Elige Radon Athanasia Stanwood, MD   rosuvastatin 10 MG tablet Commonly known as: CRESTOR Take 1 tablet (10 mg total) by mouth daily. What changed: how much to take Changed by: Nils Pyle, MD   traZODone 50 MG tablet Commonly known as: DESYREL Take 0.5-1 tablets (25-50 mg total) by mouth at bedtime as needed for sleep.   triamcinolone cream 0.1 % Commonly known as: KENALOG Apply topically 2 (two) times daily.         Objective:   BP (!) 165/60   Pulse (!) 51   Ht 5\' 5"  (1.651 m)   Wt 135 lb (61.2 kg)   SpO2 96%   BMI 22.47 kg/m   Wt Readings from Last 3 Encounters:  11/10/22 135 lb (61.2 kg)  09/27/22 136 lb (61.7 kg)  03/15/22 136 lb (61.7 kg)    Physical Exam Vitals and nursing note reviewed.  Constitutional:      General: She is not in acute distress.    Appearance: She is well-developed. She is not diaphoretic.  Eyes:     Conjunctiva/sclera: Conjunctivae normal.   Cardiovascular:     Rate and Rhythm: Normal rate and regular rhythm.     Heart sounds: Murmur (2 out of 6 holosystolic murmur) heard.  Pulmonary:     Effort: Pulmonary effort is normal. No respiratory distress.     Breath sounds: Normal breath sounds. No wheezing.  Abdominal:     General: Abdomen is flat. Bowel sounds are normal. There is no distension.     Tenderness: There is no abdominal tenderness. There is no right CVA tenderness,  left CVA tenderness, guarding or rebound.     Hernia: No hernia is present.  Musculoskeletal:        General: No swelling. Normal range of motion.  Skin:    General: Skin is warm and dry.     Findings: No lesion or rash.  Neurological:     Mental Status: She is alert and oriented to person, place, and time.     Coordination: Coordination normal.  Psychiatric:        Behavior: Behavior normal.       Assessment & Plan:   Problem List Items Addressed This Visit       Cardiovascular and Mediastinum   Hypertension - Primary   Relevant Medications   amLODipine (NORVASC) 2.5 MG tablet   metoprolol succinate (TOPROL-XL) 25 MG 24 hr tablet   rosuvastatin (CRESTOR) 10 MG tablet   Other Relevant Orders   CMP14+EGFR   CBC with Differential/Platelet     Digestive   Collagenous colitis   Relevant Medications   Budesonide ER (UCERIS) 9 MG TB24     Nervous and Auditory   HOH (hard of hearing)     Other   Depression   Relevant Medications   buPROPion (WELLBUTRIN SR) 150 MG 12 hr tablet   traZODone (DESYREL) 50 MG tablet   Dyslipidemia (high LDL; low HDL)   Relevant Medications   rosuvastatin (CRESTOR) 10 MG tablet   Other Relevant Orders   Lipid panel   Other Visit Diagnoses     Memory deficit       Relevant Medications   donepezil (ARICEPT ODT) 5 MG disintegrating tablet   Insomnia, unspecified type       Relevant Medications   traZODone (DESYREL) 50 MG tablet   Encounter for screening mammogram for malignant neoplasm of breast        Relevant Orders   MM 3D SCREENING MAMMOGRAM BILATERAL BREAST       Will try a short course of treatment for what she had diagnosed in the past which was collagenous colitis.  Hopefully this will calm down the flare and then she can go back to using the Imodium that she has been using.  Otherwise continue current medicine, blood pressure elevated today but she does get very nervous and here, will recheck at next visit but recommended for her to keep an eye on it at home as well and call us with numbers, concerned about lowering blood pressure too much because of age and memory Follow up plan: Return in about 6 months (around 05/12/2023), or if symptoms worsen or fail to improve, for Hypertension and dyslipidemia recheck.  Counseling provided for all of the vaccine components Orders Placed This Encounter  Procedures   MM 3D SCREENING MAMMOGRAM BILATERAL BREAST   CMP14+EGFR   CBC with Differential/Platelet   Lipid panel    Arville Care, MD [facility] 11/10/2022, 1:26 PM

## 2022-11-11 LAB — LIPID PANEL
Chol/HDL Ratio: 1.7 ratio (ref 0.0–4.4)
Cholesterol, Total: 164 mg/dL (ref 100–199)
HDL: 99 mg/dL (ref >39)
LDL Chol Calc (NIH): 53 mg/dL (ref 0–99)
Triglycerides: 58 mg/dL (ref 0–149)
VLDL Cholesterol Cal: 12 mg/dL (ref 5–40)

## 2022-11-11 LAB — CBC WITH DIFFERENTIAL/PLATELET
Basophils Absolute: 0.1 10*3/uL (ref 0.0–0.2)
Basos: 1 %
EOS (ABSOLUTE): 0.2 10*3/uL (ref 0.0–0.4)
Eos: 4 %
Hematocrit: 35.1 % (ref 34.0–46.6)
Hemoglobin: 11.4 g/dL (ref 11.1–15.9)
Immature Grans (Abs): 0 10*3/uL (ref 0.0–0.1)
Immature Granulocytes: 0 %
Lymphocytes Absolute: 1.6 10*3/uL (ref 0.7–3.1)
Lymphs: 24 %
MCH: 30.6 pg (ref 26.6–33.0)
MCHC: 32.5 g/dL (ref 31.5–35.7)
MCV: 94 fL (ref 79–97)
Monocytes Absolute: 0.7 10*3/uL (ref 0.1–0.9)
Monocytes: 11 %
Neutrophils Absolute: 3.9 10*3/uL (ref 1.4–7.0)
Neutrophils: 60 %
Platelets: 287 10*3/uL (ref 150–450)
RBC: 3.72 x10E6/uL — ABNORMAL LOW (ref 3.77–5.28)
RDW: 12.3 % (ref 11.7–15.4)
WBC: 6.5 10*3/uL (ref 3.4–10.8)

## 2022-11-11 LAB — CMP14+EGFR
ALT: 18 IU/L (ref 0–32)
AST: 18 IU/L (ref 0–40)
Albumin/Globulin Ratio: 2.1
Albumin: 4.1 g/dL (ref 3.7–4.7)
Alkaline Phosphatase: 100 IU/L (ref 44–121)
BUN/Creatinine Ratio: 17 (ref 12–28)
BUN: 24 mg/dL (ref 8–27)
Bilirubin Total: 0.3 mg/dL (ref 0.0–1.2)
CO2: 24 mmol/L (ref 20–29)
Calcium: 9.4 mg/dL (ref 8.7–10.3)
Chloride: 99 mmol/L (ref 96–106)
Creatinine, Ser: 1.45 mg/dL — ABNORMAL HIGH (ref 0.57–1.00)
Globulin, Total: 2 g/dL (ref 1.5–4.5)
Glucose: 87 mg/dL (ref 70–99)
Potassium: 4.3 mmol/L (ref 3.5–5.2)
Sodium: 138 mmol/L (ref 134–144)
Total Protein: 6.1 g/dL (ref 6.0–8.5)
eGFR: 36 mL/min/{1.73_m2} — ABNORMAL LOW (ref >59)

## 2022-11-15 ENCOUNTER — Telehealth: Payer: Self-pay | Admitting: Family Medicine

## 2022-11-15 NOTE — Telephone Encounter (Signed)
Per Dr Dettinger SOB, and chest pain is not a side effect of the shingles. Pt states that she did have a fever and was shaking. Advised that could be a symptom of the vaccine and that it was normal.  Pt stats that she does have a h/o asthma and that she used her inhaler and that it did help with the SOB and chest pains.  Pt confirms that she is better and has no furhter concerns.

## 2022-11-15 NOTE — Telephone Encounter (Signed)
Patient was seen on 6/12 by her PCP and had a shingles shot. Stated that she had chest pains and SOB that night. Did not go anywhere to be seen or call us to report these sxs until today (6/17), although she stated that she wanted to go to the hospital. She is upset because she said that no one discussed the side effects with her about this shot. Wants to talk to a nurse about it.

## 2022-11-15 NOTE — Progress Notes (Signed)
Patient r/ c °

## 2022-11-16 ENCOUNTER — Other Ambulatory Visit (HOSPITAL_COMMUNITY): Payer: Self-pay

## 2022-11-16 ENCOUNTER — Telehealth: Payer: Self-pay

## 2022-11-16 NOTE — Telephone Encounter (Signed)
PA submitted via CMM. Created new encounter for PA. Will route back to pool once determination has been made. 

## 2022-11-16 NOTE — Telephone Encounter (Signed)
Pharmacy Patient Advocate Encounter   Received notification that prior authorization for Budesonide ER 9MG  er tablets is required/requested.   PA submitted to [facility] via CoverMyMeds Key or (Medicaid) confirmation # [id number] Status is pending

## 2022-11-17 NOTE — Telephone Encounter (Signed)
Wants to know the medication patient has tried in the past.

## 2022-11-19 NOTE — Telephone Encounter (Signed)
Pharmacy Patient Advocate Encounter  Received notification from [employer] that the request for prior authorization for Budesonide Tab Er 9mg  has been denied due to no specific medical reasons patient is unable to use Sulfasalazine and no specific medical reasons patient is unable to use one of the following: brand Apriso, Mesalamine DR 1.2gm, or Mesalamine ER 0.375gm. .    Please be advised we currently do not have a Pharmacist to review denials, therefore you will need to process appeals accordingly as needed. Thanks for your support at this time.   You may call [phone] or fax [phone], to appeal.

## 2022-11-22 NOTE — Telephone Encounter (Signed)
This was based on the recommendation from the gastroenterologist note, I would just instruct the patient that from this medicine was not covered so she should probably go back to the gastroenterologist to discuss either this medicine or other options.

## 2022-11-22 NOTE — Telephone Encounter (Signed)
No answer and vmail not set up 

## 2022-12-22 ENCOUNTER — Ambulatory Visit
Admission: RE | Admit: 2022-12-22 | Discharge: 2022-12-22 | Disposition: A | Discharge status: Home / Self Care | Payer: 59 | Source: Ambulatory Visit | Attending: Family Medicine | Admitting: Family Medicine

## 2022-12-22 DIAGNOSIS — Z1231 Encounter for screening mammogram for malignant neoplasm of breast: Secondary | ICD-10-CM | POA: Diagnosis not present

## 2022-12-31 ENCOUNTER — Other Ambulatory Visit: Payer: Self-pay | Admitting: Family Medicine

## 2023-04-13 ENCOUNTER — Other Ambulatory Visit: Payer: Self-pay | Admitting: Family Medicine

## 2023-05-06 NOTE — Telephone Encounter (Signed)
As long as she is not having any fevers that she can have the flu vaccine.  Same with the pneumonia shot.

## 2023-05-06 NOTE — Telephone Encounter (Signed)
Copied from CRM [id number]. Topic: Appointments - Appointment Scheduling >> May 06, 2023  4:11 PM Tiffany H wrote: Patient rescheduled six month follow up from 05/12/23 to 06/15/23. Patient has had a cough/cold for 3 weeks. The cough is persistent with a runny nose but there are no other lingering symptoms. Patient would like to know when she should have her flu shot. Tentatively added flu shot and pneumonia shot to 06/15/23 appointment notes. Please assist.

## 2023-05-12 ENCOUNTER — Ambulatory Visit: Payer: 59 | Admitting: Family Medicine

## 2023-05-17 ENCOUNTER — Other Ambulatory Visit: Payer: Self-pay | Admitting: Family Medicine

## 2023-06-15 ENCOUNTER — Encounter: Payer: Self-pay | Admitting: Family Medicine

## 2023-06-15 ENCOUNTER — Ambulatory Visit: Payer: 59 | Admitting: Family Medicine

## 2023-06-15 VITALS — BP 161/64 | HR 56 | Ht 65.0 in | Wt 136.0 lb

## 2023-06-15 DIAGNOSIS — E785 Hyperlipidemia, unspecified: Secondary | ICD-10-CM

## 2023-06-15 DIAGNOSIS — I10 Essential (primary) hypertension: Secondary | ICD-10-CM

## 2023-06-15 DIAGNOSIS — F3341 Major depressive disorder, recurrent, in partial remission: Secondary | ICD-10-CM | POA: Diagnosis not present

## 2023-06-15 DIAGNOSIS — Z23 Encounter for immunization: Secondary | ICD-10-CM

## 2023-06-15 DIAGNOSIS — Z780 Asymptomatic menopausal state: Secondary | ICD-10-CM

## 2023-06-15 DIAGNOSIS — L6612 Frontal fibrosing alopecia: Secondary | ICD-10-CM | POA: Diagnosis not present

## 2023-06-15 LAB — LIPID PANEL

## 2023-06-15 MED ORDER — TRIAMCINOLONE ACETONIDE 0.1 % EX CREA: TOPICAL_CREAM | Freq: Two times a day (BID) | CUTANEOUS | 1 refills | Status: DC

## 2023-06-15 NOTE — Progress Notes (Addendum)
 BP (!) 161/64   Pulse (!) 56   Ht 5\' 5"  (1.651 m)   Wt 136 lb (61.7 kg)   SpO2 97%   BMI 22.63 kg/m    Subjective:   Patient ID: Pamela Hughes, female    DOB: 1941-04-15, 83 y.o.   MRN: [id number]  HPI: Pamela Hughes is a 83 y.o. female presenting on 06/15/2023 for Medical Management of Chronic Issues, Hypertension, and Hyperlipidemia   HPI Hypertension Patient is currently on amlodipine  and metoprolol , and their blood pressure today is 161/64. Patient denies any lightheadedness or dizziness. Patient denies headaches, blurred vision, chest pains, shortness of breath, or weakness. Denies any side effects from medication and is content with current medication.   Hyperlipidemia Patient is coming in for recheck of his hyperlipidemia. The patient is currently taking Crestor . They deny any issues with myalgias or history of liver damage from it. They deny any focal numbness or weakness or chest pain.   Depression and dementia Patient is currently taking Wellbutrin  and trazodone  for sleep and Aricept .  Patient's memory still about the same and her mood seems to be fine per her.    06/15/2023    3:37 PM 11/10/2022    1:05 PM 09/27/2022   12:41 PM 03/15/2022    1:22 PM 01/21/2022    2:18 PM  Depression screen PHQ 2/9  Decreased Interest 0 0 0 0 1  Down, Depressed, Hopeless 1 2 0 0 0  PHQ - 2 Score 1 2 0 0 1  Altered sleeping 1 3  0   Tired, decreased energy 1 0  2   Change in appetite 0 0  0   Feeling bad or failure about yourself  0 0  0   Trouble concentrating 0 0  0   Moving slowly or fidgety/restless 0 0  0   Suicidal thoughts 0 0  0   PHQ-9 Score 3 5  2    Difficult doing work/chores Not difficult at all Not difficult at all  Not difficult at all      Relevant past medical, surgical, family and social history reviewed and updated as indicated. Interim medical history since our last visit reviewed. Allergies and medications reviewed and updated.  Review of Systems   Constitutional:  Negative for chills and fever.  HENT:  Negative for congestion, ear discharge and ear pain.   Eyes:  Negative for redness and visual disturbance.  Respiratory:  Negative for chest tightness and shortness of breath.   Cardiovascular:  Negative for chest pain and leg swelling.  Genitourinary:  Negative for difficulty urinating and dysuria.  Musculoskeletal:  Negative for back pain and gait problem.  Skin:  Negative for rash.  Neurological:  Negative for light-headedness and headaches.  Psychiatric/Behavioral:  Negative for agitation, behavioral problems, dysphoric mood, self-injury and suicidal ideas. The patient is not nervous/anxious.   All other systems reviewed and are negative.   Per HPI unless specifically indicated above   Allergies as of 06/15/2023   No Known Allergies      Medication List        Accurate as of June 15, 2023  4:40 PM. If you have any questions, ask your nurse or doctor.          albuterol  108 (90 Base) MCG/ACT inhaler Commonly known as: VENTOLIN  HFA USE 2 PUFFS EVERY 6 HOURS AS NEEDED   amLODipine  2.5 MG tablet Commonly known as: NORVASC  Take 1 tablet (2.5 mg total) by mouth daily.  Budesonide  ER 9 MG Tb24 Commonly known as: Uceris  Take 1 tablet (9 mg total) by mouth daily.   buPROPion  150 MG 12 hr tablet Commonly known as: WELLBUTRIN  SR Take 1 tablet (150 mg total) by mouth every morning.   donepezil  5 MG disintegrating tablet Commonly known as: ARICEPT  ODT Take 1 tablet (5 mg total) by mouth at bedtime.   ipratropium 0.03 % nasal spray Commonly known as: ATROVENT  USE 2 SPRAYS IN EACH NOSTRIL TWICE DAILYAS DIRECTED   meclizine  25 MG tablet Commonly known as: ANTIVERT  TAKE ONE TABLET 3 TIMES A DAY AS NEEDED.   meloxicam  15 MG tablet Commonly known as: MOBIC  Take 1 tablet (15 mg total) by mouth daily.   metoprolol  succinate 25 MG 24 hr tablet Commonly known as: TOPROL -XL Take 1 tablet (25 mg total) by mouth  daily.   omeprazole  20 MG capsule Commonly known as: PRILOSEC Take 1 capsule (20 mg total) by mouth daily.   rosuvastatin  10 MG tablet Commonly known as: CRESTOR  Take 1 tablet (10 mg total) by mouth daily.   traZODone  50 MG tablet Commonly known as: DESYREL  Take 0.5-1 tablets (25-50 mg total) by mouth at bedtime as needed for sleep.   triamcinolone  cream 0.1 % Commonly known as: KENALOG  Apply topically 2 (two) times daily.         Objective:   BP (!) 161/64   Pulse (!) 56   Ht 5\' 5"  (1.651 m)   Wt 136 lb (61.7 kg)   SpO2 97%   BMI 22.63 kg/m   Wt Readings from Last 3 Encounters:  06/15/23 136 lb (61.7 kg)  11/10/22 135 lb (61.2 kg)  09/27/22 136 lb (61.7 kg)    Physical Exam Vitals and nursing note reviewed.  Constitutional:      General: She is not in acute distress.    Appearance: She is well-developed. She is not diaphoretic.  Eyes:     Conjunctiva/sclera: Conjunctivae normal.  Cardiovascular:     Rate and Rhythm: Normal rate and regular rhythm.     Heart sounds: Murmur heard.     Crescendo decrescendo systolic murmur is present with a grade of 3/6.  Pulmonary:     Effort: Pulmonary effort is normal. No respiratory distress.     Breath sounds: Normal breath sounds. No wheezing.  Musculoskeletal:        General: No tenderness. Normal range of motion.  Skin:    General: Skin is warm and dry.     Findings: No rash.  Neurological:     Mental Status: She is alert and oriented to person, place, and time.     Coordination: Coordination normal.  Psychiatric:        Behavior: Behavior normal.        Thought Content: Thought content does not include suicidal ideation. Thought content does not include suicidal plan.        Cognition and Memory: She exhibits impaired recent memory.       Assessment & Plan:   Problem List Items Addressed This Visit       Cardiovascular and Mediastinum   Hypertension - Primary   Relevant Orders   CBC with  Differential/Platelet   CMP14+EGFR     Musculoskeletal and Integument   Frontal fibrosing alopecia   Relevant Medications   triamcinolone  cream (KENALOG ) 0.1 %     Other   Depression   Dyslipidemia (high LDL; low HDL)   Relevant Orders   Lipid panel   Other Visit Diagnoses  Postmenopausal       Relevant Orders   DG [facility] DEXA       Will check blood work on the way out.  No adjustment in medication but she will watch her blood pressure closely at home.  She has such a wide pulse pressure I am hesitant to increase especially with her dementia. Follow up plan: Return in about 6 months (around 12/13/2023), or if symptoms worsen or fail to improve, for Needs bone density and also 52-month follow-up with PCP for hypertension and cholesterol.  Counseling provided for all of the vaccine components Orders Placed This Encounter  Procedures   DG [facility] DEXA   CBC with Differential/Platelet   CMP14+EGFR   Lipid panel    Jolyne Needs, MD [facility] 06/15/2023, 4:40 PM

## 2023-06-15 NOTE — Patient Instructions (Addendum)
 Please call the office to schedule your Bone Density Scan ( DEXA) and your pneumonia vaccination.  Check you blood pressure twice daily over the next two weeks and document the readings. After two weeks please either call the office with the readings or come by the office and give to the front desk to give to Dr. Steen Eden.

## 2023-06-16 LAB — LIPID PANEL
Cholesterol, Total: 204 mg/dL — ABNORMAL HIGH (ref 100–199)
HDL: 121 mg/dL (ref >39)
LDL CALC COMMENT:: 1.7 {ratio} (ref 0.0–4.4)
LDL Chol Calc (NIH): 72 mg/dL (ref 0–99)
Triglycerides: 58 mg/dL (ref 0–149)
VLDL Cholesterol Cal: 11 mg/dL (ref 5–40)

## 2023-06-16 LAB — CMP14+EGFR
ALT: 19 IU/L (ref 0–32)
AST: 22 [IU]/L (ref 0–40)
Albumin: 4 g/dL (ref 3.7–4.7)
Alkaline Phosphatase: 101 IU/L (ref 44–121)
BUN/Creatinine Ratio: 19 (ref 12–28)
BUN: 29 mg/dL — ABNORMAL HIGH (ref 8–27)
Bilirubin Total: 0.4 mg/dL (ref 0.0–1.2)
CO2: 24 mmol/L (ref 20–29)
Calcium: 9.3 mg/dL (ref 8.7–10.3)
Chloride: 100 mmol/L (ref 96–106)
Creatinine, Ser: 1.5 mg/dL — ABNORMAL HIGH (ref 0.57–1.00)
Globulin, Total: 2.2 g/dL (ref 1.5–4.5)
Glucose: 85 mg/dL (ref 70–99)
Potassium: 4.9 mmol/L (ref 3.5–5.2)
Sodium: 139 mmol/L (ref 134–144)
Total Protein: 6.2 g/dL (ref 6.0–8.5)
eGFR: 35 mL/min/{1.73_m2} — ABNORMAL LOW (ref >59)

## 2023-06-16 LAB — CBC WITH DIFFERENTIAL/PLATELET
Basophils Absolute: 0.1 10*3/uL (ref 0.0–0.2)
Basos: 1 %
EOS (ABSOLUTE): 0.2 10*3/uL (ref 0.0–0.4)
Eos: 3 %
Hematocrit: 35 % (ref 34.0–46.6)
Hemoglobin: 12.4 g/dL (ref 11.1–15.9)
Immature Grans (Abs): 0 10*3/uL (ref 0.0–0.1)
Immature Granulocytes: 0 %
Lymphocytes Absolute: 1.6 10*3/uL (ref 0.7–3.1)
Lymphs: 28 %
MCH: 34.1 pg — ABNORMAL HIGH (ref 26.6–33.0)
MCHC: 35.4 g/dL (ref 31.5–35.7)
MCV: 96 fL (ref 79–97)
Monocytes Absolute: 0.8 10*3/uL (ref 0.1–0.9)
Monocytes: 14 %
Neutrophils Absolute: 3 10*3/uL (ref 1.4–7.0)
Neutrophils: 54 %
Platelets: 280 10*3/uL (ref 150–450)
RBC: 3.64 x10E6/uL — ABNORMAL LOW (ref 3.77–5.28)
RDW: 12.4 % (ref 11.7–15.4)
WBC: 5.5 10*3/uL (ref 3.4–10.8)

## 2023-06-23 ENCOUNTER — Encounter: Payer: Self-pay | Admitting: Family Medicine

## 2023-08-04 ENCOUNTER — Ambulatory Visit: Payer: Self-pay | Admitting: Family Medicine

## 2023-08-04 NOTE — Telephone Encounter (Signed)
 Nurse Randa Evens from [facility] calling, she performed routine yearly check in today. Calling now to report stable aysymptomatic high blood pressure. Readings this morning were 190/80 with repeat 180/78. HR 66. Patient reports she took her daily medication today. Office visit advised, unable to attend virtual visit.  Scheduled with another provider in office. Discussed reasons to call back.    Copied from CRM [id number]. Topic: Clinical - Red Word Triage >> Aug 04, 2023  2:43 PM Elle L wrote: Red Word that prompted transfer to Nurse Triage: NP with [facility] was calling to report that the patient's blood pressure is reading 190/80 and 180/78. However, the patient is asymptomatic. Reason for Disposition  Systolic BP  >= 180 OR Diastolic >= 110  Answer Assessment - Initial Assessment Questions 1. BLOOD PRESSURE: "What is the blood pressure?" "Did you take at least two measurements 5 minutes apart?"     180/78 2. ONSET: "When did you take your blood pressure?"     Today with nurse practitioner in home  3. HOW: "How did you take your blood pressure?" (e.g., automatic home BP monitor, visiting nurse)     Nurse practitioner 4. HISTORY: "Do you have a history of high blood pressure?"     yes 5. MEDICINES: "Are you taking any medicines for blood pressure?" "Have you missed any doses recently?"     yes 6. OTHER SYMPTOMS: "Do you have any symptoms?" (e.g., blurred vision, chest pain, difficulty breathing, headache, weakness)     None  Protocols used: Blood Pressure - High-A-AH

## 2023-08-05 ENCOUNTER — Ambulatory Visit: Admitting: Nurse Practitioner

## 2023-08-15 ENCOUNTER — Ambulatory Visit (INDEPENDENT_AMBULATORY_CARE_PROVIDER_SITE_OTHER): Admitting: Family Medicine

## 2023-08-15 ENCOUNTER — Encounter: Payer: Self-pay | Admitting: Family Medicine

## 2023-08-15 VITALS — BP 137/65 | HR 71 | Ht 65.0 in | Wt 133.0 lb

## 2023-08-15 DIAGNOSIS — I10 Essential (primary) hypertension: Secondary | ICD-10-CM | POA: Diagnosis not present

## 2023-08-15 DIAGNOSIS — G4700 Insomnia, unspecified: Secondary | ICD-10-CM

## 2023-08-15 MED ORDER — TRAZODONE HCL 50 MG PO TABS: 25.0000 mg | ORAL_TABLET | Freq: Every evening | ORAL | 2 refills | Status: DC | PRN

## 2023-08-15 NOTE — Progress Notes (Signed)
 BP 137/65   Pulse 71   Ht 5\' 5"  (1.651 m)   Wt 133 lb (60.3 kg)   SpO2 98%   BMI 22.13 kg/m    Subjective:   Patient ID: Pamela Hughes, female    DOB: [birth year], 83 y.o.   MRN: [id number]  HPI: Pamela Hughes is a 83 y.o. female presenting on 08/15/2023 for Medical Management of Chronic Issues and Hypertension   HPI Hypertension Patient is currently on amlodipine and metoprolol, and their blood pressure today is 137/65, she says she has been getting some higher numbers at home. Patient denies any lightheadedness or dizziness. Patient denies headaches, blurred vision, chest pains, shortness of breath, or weakness. Denies any side effects from medication and is content with current medication.   Relevant past medical, surgical, family and social history reviewed and updated as indicated. Interim medical history since our last visit reviewed. Allergies and medications reviewed and updated.  Review of Systems  Constitutional:  Negative for chills and fever.  Eyes:  Negative for redness and visual disturbance.  Respiratory:  Negative for chest tightness and shortness of breath.   Cardiovascular:  Negative for chest pain and leg swelling.  Genitourinary:  Negative for difficulty urinating and dysuria.  Musculoskeletal:  Negative for back pain and gait problem.  Skin:  Negative for rash.  Neurological:  Negative for dizziness, light-headedness and headaches.  Psychiatric/Behavioral:  Negative for agitation and behavioral problems.   All other systems reviewed and are negative.   Per HPI unless specifically indicated above   Allergies as of 08/15/2023   No Known Allergies      Medication List        Accurate as of August 15, 2023  3:25 PM. If you have any questions, ask your nurse or doctor.          albuterol 108 (90 Base) MCG/ACT inhaler Commonly known as: VENTOLIN HFA USE 2 PUFFS EVERY 6 HOURS AS NEEDED   amLODipine 2.5 MG tablet Commonly known as: NORVASC Take 1  tablet (2.5 mg total) by mouth daily.   Budesonide ER 9 MG Tb24 Commonly known as: Uceris Take 1 tablet (9 mg total) by mouth daily.   buPROPion 150 MG 12 hr tablet Commonly known as: WELLBUTRIN SR Take 1 tablet (150 mg total) by mouth every morning.   donepezil 5 MG disintegrating tablet Commonly known as: ARICEPT ODT Take 1 tablet (5 mg total) by mouth at bedtime.   ipratropium 0.03 % nasal spray Commonly known as: ATROVENT USE 2 SPRAYS IN EACH NOSTRIL TWICE DAILYAS DIRECTED   meclizine 25 MG tablet Commonly known as: ANTIVERT TAKE ONE TABLET 3 TIMES A DAY AS NEEDED.   meloxicam 15 MG tablet Commonly known as: MOBIC Take 1 tablet (15 mg total) by mouth daily.   metoprolol succinate 25 MG 24 hr tablet Commonly known as: TOPROL-XL Take 1 tablet (25 mg total) by mouth daily.   omeprazole 20 MG capsule Commonly known as: PRILOSEC Take 1 capsule (20 mg total) by mouth daily.   rosuvastatin 10 MG tablet Commonly known as: CRESTOR Take 1 tablet (10 mg total) by mouth daily.   traZODone 50 MG tablet Commonly known as: DESYREL Take 0.5-1 tablets (25-50 mg total) by mouth at bedtime as needed for sleep.   triamcinolone cream 0.1 % Commonly known as: KENALOG Apply topically 2 (two) times daily.         Objective:   BP 137/65   Pulse 71   Ht  5\' 5"  (1.651 m)   Wt 133 lb (60.3 kg)   SpO2 98%   BMI 22.13 kg/m   Wt Readings from Last 3 Encounters:  08/15/23 133 lb (60.3 kg)  06/15/23 136 lb (61.7 kg)  11/10/22 135 lb (61.2 kg)    Physical Exam Vitals and nursing note reviewed.  Constitutional:      General: She is not in acute distress.    Appearance: She is well-developed. She is not diaphoretic.  Eyes:     Conjunctiva/sclera: Conjunctivae normal.  Cardiovascular:     Rate and Rhythm: Normal rate and regular rhythm.     Heart sounds: Normal heart sounds. No murmur heard. Pulmonary:     Effort: Pulmonary effort is normal. No respiratory distress.      Breath sounds: Normal breath sounds. No wheezing.  Musculoskeletal:        General: No swelling.  Skin:    General: Skin is warm and dry.     Findings: No rash.  Neurological:     Mental Status: She is alert and oriented to person, place, and time.     Coordination: Coordination normal.  Psychiatric:        Behavior: Behavior normal.       Assessment & Plan:   Problem List Items Addressed This Visit       Cardiovascular and Mediastinum   Hypertension - Primary    Blood pressure looks decent today, I do not think we should change any medications especially because she has had some lightheadedness and dizziness when going up on the medicine previously.  Keep a log of blood pressures and bring it back when she comes back. Follow up plan: Return if symptoms worsen or fail to improve.  Counseling provided for all of the vaccine components No orders of the defined types were placed in this encounter.   Arville Care, MD [facility] 08/15/2023, 3:25 PM

## 2023-08-23 NOTE — Patient Instructions (Signed)
 Visit Information  Thank you for taking time to visit with me today. Please don't hesitate to contact me if I can be of assistance to you.   Following are the goals we discussed today:   Goals Addressed               This Visit's Progress     Patient Stated     [facility] care coordination services (pt-stated)   On track     Interventions Today    Flowsheet Row Most Recent Value  Chronic Disease   Chronic disease during today's visit Other  [dental services for dental impression, colitis]  General Interventions   General Interventions Discussed/Reviewed General Interventions Reviewed, Community Resources, Doctor Visits  Doctor Visits Discussed/Reviewed Doctor Visits Reviewed, Specialist, PCP  PCP/Specialist Visits Compliance with follow-up visit  Education Interventions   Education Provided Provided Education  Provided Verbal Education On [employer] with dental and audiology providers/visits]  Mental Health Interventions   Mental Health Discussed/Reviewed Mental Health Discussed, Coping Strategies              Our next appointment is  na  on na at na  Please call the care guide team at [phone] if you need to cancel or reschedule your appointment.   If you are experiencing a Mental Health or Behavioral Health Crisis or need someone to talk to, please call the Suicide and Crisis Lifeline: 988 call the [place] National Suicide Prevention Lifeline: 720-438-2972 or TTY: 910-286-9612 TTY (607)238-0137) to talk to a trained counselor call 1-800-273-TALK (toll free, 24 hour hotline) call the [facility]: ([phone] call 911   Patient verbalizes understanding of instructions and care plan provided today and agrees to view in MyChart. Active MyChart status and patient understanding of how to access instructions and care plan via MyChart confirmed with patient.     The patient has been provided with contact information for the care  management team and has been advised to call with any health related questions or concerns.    Migdalia Olejniczak L. Noelle Penner, RN, BSN, CCM [facility] RN Care Manager [phone]

## 2023-09-22 NOTE — Progress Notes (Unsigned)
  Cardiology Office Note:   Date:  09/23/2023  ID:  Pamela Hughes, DOB 1941/02/04, MRN [id number] PCP: Dettinger, Lucio Sabin, MD  [facility] Providers Cardiologist:  Eilleen Grates, MD {  History of Present Illness:   Pamela Hughes is a 83 y.o. female who presents for follow up of AS and carotid stenosis  .  She was previously seen by Dr. Wanetta Guthrie.  She had a previous large occipital CVA.  She has had bleeding on ASA and Plavix  and these were stopped.  She has aortic stenosis.     She is done okay since I last saw her.  She lives alone with her dog [name] but he is 25.  She has had a CVA and has some memory disorder.  Her boyfriend is with her today.  She is not having any new cardiovascular symptoms. The patient denies any new symptoms such as chest discomfort, neck or arm discomfort. There has been no new shortness of breath, PND or orthopnea. There have been no reported palpitations, presyncope or syncope.   ROS: As stated in the HPI and negative for all other systems.  Studies Reviewed:    EKG:   EKG Interpretation Date/Time:  Friday September 23 2023 16:01:36 EDT Ventricular Rate:  56 PR Interval:  164 QRS Duration:  78 QT Interval:  486 QTC Calculation: 468 R Axis:   62  Text Interpretation: Sinus bradycardia with occasional Premature ventricular complexes Nonspecific ST abnormality When compared with ECG of 23-Sep-2023 16:01, No significant change was found Confirmed by Eilleen Grates ([id number]) on 09/23/2023 4:13:01 PM    Risk Assessment/Calculations:       Physical Exam:   VS:  BP (!) 154/74   Pulse (!) 56   Ht 5' 5.5" (1.664 m)   Wt 137 lb 3.2 oz (62.2 kg)   SpO2 96%   BMI 22.48 kg/m    Wt Readings from Last 3 Encounters:  09/23/23 137 lb 3.2 oz (62.2 kg)  08/15/23 133 lb (60.3 kg)  06/15/23 136 lb (61.7 kg)     GEN: Well nourished, well developed in no acute distress NECK: No JVD; Bilateral carotid bruits CARDIAC: RRR, 3 out of 6 systolic murmur radiating  at the aortic outflow tract and mid peaking, no diastolic murmurs, rubs, gallops RESPIRATORY:  Clear to auscultation without rales, wheezing or rhonchi  ABDOMEN: Soft, non-tender, non-distended EXTREMITIES:  No edema; No deformity   ASSESSMENT AND PLAN:   Aortic stenosis: Mild to moderate  in severity by echo in July 2022.  I will follow-up with an echocardiogram.  Hypertension:   The blood pressure is elevated consistently and I am getting increase her amlodipine  to 5.  She said she did feel a little woozy in the past and her diastolic was low with that but she is willing to try it again.   Hypercholesterolemia:   LDL was 72.  No change in therapy.  Carotid stenosis: I will repeat carotid Dopplers.  She has had variable results with previously a 60% stenosis although it was not identified in the last 2 studies.  I like to just make sure there is no obstructive disease.    CVA: She has not tolerated aspirin  or Plavix  in the past.     Follow up with me in 1 year.  Signed, Eilleen Grates, MD

## 2023-09-23 ENCOUNTER — Ambulatory Visit (INDEPENDENT_AMBULATORY_CARE_PROVIDER_SITE_OTHER): Admitting: Cardiology

## 2023-09-23 ENCOUNTER — Encounter: Payer: Self-pay | Admitting: Cardiology

## 2023-09-23 VITALS — BP 154/74 | HR 56 | Ht 65.5 in | Wt 137.2 lb

## 2023-09-23 DIAGNOSIS — I350 Nonrheumatic aortic (valve) stenosis: Secondary | ICD-10-CM

## 2023-09-23 DIAGNOSIS — I10 Essential (primary) hypertension: Secondary | ICD-10-CM | POA: Diagnosis not present

## 2023-09-23 DIAGNOSIS — R0989 Other specified symptoms and signs involving the circulatory and respiratory systems: Secondary | ICD-10-CM

## 2023-09-23 DIAGNOSIS — I639 Cerebral infarction, unspecified: Secondary | ICD-10-CM | POA: Diagnosis not present

## 2023-09-23 DIAGNOSIS — E785 Hyperlipidemia, unspecified: Secondary | ICD-10-CM

## 2023-09-23 MED ORDER — AMLODIPINE BESYLATE 5 MG PO TABS: 5.0000 mg | ORAL_TABLET | Freq: Every day | ORAL | 3 refills | Status: DC

## 2023-09-23 NOTE — Patient Instructions (Signed)
 Medication Instructions:  Your physician has recommended you make the following change in your medication:   Increase Amlodipine  ( Norvasc ) to 5 mg Daily   *If you need a refill on your cardiac medications before your next appointment, please call your pharmacy*  Lab Work: NONE   If you have labs (blood work) drawn today and your tests are completely normal, you will receive your results only by: MyChart Message (if you have MyChart) OR A paper copy in the mail If you have any lab test that is abnormal or we need to change your treatment, we will call you to review the results.  Testing/Procedures: Your physician has requested that you have a carotid duplex. This test is an ultrasound of the carotid arteries in your neck. It looks at blood flow through these arteries that supply the brain with blood. Allow one hour for this exam. There are no restrictions or special instructions.  Your physician has requested that you have an echocardiogram. Echocardiography is a painless test that uses sound waves to create images of your heart. It provides your doctor with information about the size and shape of your heart and how well your heart's chambers and valves are working. This procedure takes approximately one hour. There are no restrictions for this procedure. Please do NOT wear cologne, perfume, aftershave, or lotions (deodorant is allowed). Please arrive 15 minutes prior to your appointment time.  Please note: We ask at that you not bring children with you during ultrasound (echo/ vascular) testing. Due to room size and safety concerns, children are not allowed in the ultrasound rooms during exams. Our front office staff cannot provide observation of children in our lobby area while testing is being conducted. An adult accompanying a patient to their appointment will only be allowed in the ultrasound room at the discretion of the ultrasound technician under special circumstances. We apologize for  any inconvenience.   Follow-Up: At [facility], you and your health needs are our priority.  As part of our continuing mission to provide you with exceptional heart care, our providers are all part of one team.  This team includes your primary Cardiologist (physician) and Advanced Practice Providers or APPs (Physician Assistants and Nurse Practitioners) who all work together to provide you with the care you need, when you need it.  Your next appointment:   1 year(s)  Provider:   You may see Eilleen Grates, MD or one of the following Advanced Practice Providers on your designated Care Team:   Woodfin Hays, PA-C  [place], [place] Theotis Flake, [place]     We recommend signing up for the patient portal called "MyChart".  Sign up information is provided on this After Visit Summary.  MyChart is used to connect with patients for Virtual Visits (Telemedicine).  Patients are able to view lab/test results, encounter notes, upcoming appointments, etc.  Non-urgent messages can be sent to your provider as well.   To learn more about what you can do with MyChart, go to [email or web address].   Other Instructions Thank you for choosing [facility]!

## 2023-09-30 ENCOUNTER — Ambulatory Visit (INDEPENDENT_AMBULATORY_CARE_PROVIDER_SITE_OTHER): Payer: 59

## 2023-09-30 VITALS — BP 106/73 | Ht 65.0 in | Wt 137.0 lb

## 2023-09-30 DIAGNOSIS — Z1231 Encounter for screening mammogram for malignant neoplasm of breast: Secondary | ICD-10-CM

## 2023-09-30 DIAGNOSIS — Z0100 Encounter for examination of eyes and vision without abnormal findings: Secondary | ICD-10-CM

## 2023-09-30 DIAGNOSIS — Z0000 Encounter for general adult medical examination without abnormal findings: Secondary | ICD-10-CM | POA: Diagnosis not present

## 2023-09-30 NOTE — Progress Notes (Signed)
 Subjective:   Pamela Hughes is a 83 y.o. who presents for a Medicare Wellness preventive visit.  Visit Complete: Virtual I connected with  Pamela Hughes on 09/30/23 by a audio enabled telemedicine application and verified that I am speaking with the correct person using two identifiers.  Patient Location: Home  Provider Location: Home Office  I discussed the limitations of evaluation and management by telemedicine. The patient expressed understanding and agreed to proceed.  Vital Signs: Because this visit was a virtual/telehealth visit, some criteria may be missing or patient reported. Any vitals not documented were not able to be obtained and vitals that have been documented are patient reported.  VideoDeclined- This patient declined Librarian, academic. Therefore the visit was completed with audio only.  Persons Participating in Visit: Patient.  AWV Questionnaire: No: Patient Medicare AWV questionnaire was not completed prior to this visit.  Cardiac Risk Factors include: advanced age (>14men, >11 women);sedentary lifestyle;hypertension;dyslipidemia     Objective:    Today's Vitals   09/30/23 1131  BP: 106/73  Weight: 137 lb (62.1 kg)  Height: 5\' 5"  (1.651 m)   Body mass index is 22.8 kg/m.     09/30/2023   11:41 AM 05/20/2022    3:40 PM 01/15/2022    3:03 PM 09/23/2021    2:12 PM 09/22/2020    3:02 PM 09/20/2019    2:42 PM 12/22/2018    6:59 PM  Advanced Directives  Does Patient Have a Medical Advance Directive? No No Yes No No No No  Type of Surveyor, minerals;Living will      Does patient want to make changes to medical advance directive?   No - Patient declined      Copy of Healthcare Power of Attorney in Chart?   No - copy requested      Would patient like information on creating a medical advance directive? No - Patient declined No - Patient declined  No - Patient declined No - Patient declined No - Patient  declined No - Patient declined    Current Medications (verified) Outpatient Encounter Medications as of 09/30/2023  Medication Sig   albuterol  (VENTOLIN  HFA) 108 (90 Base) MCG/ACT inhaler USE 2 PUFFS EVERY 6 HOURS AS NEEDED   amLODipine  (NORVASC ) 5 MG tablet Take 1 tablet (5 mg total) by mouth daily.   Budesonide  ER (UCERIS ) 9 MG TB24 Take 1 tablet (9 mg total) by mouth daily.   buPROPion  (WELLBUTRIN  SR) 150 MG 12 hr tablet Take 1 tablet (150 mg total) by mouth every morning.   donepezil  (ARICEPT  ODT) 5 MG disintegrating tablet Take 1 tablet (5 mg total) by mouth at bedtime.   ipratropium (ATROVENT ) 0.03 % nasal spray USE 2 SPRAYS IN EACH NOSTRIL TWICE DAILYAS DIRECTED   meclizine  (ANTIVERT ) 25 MG tablet TAKE ONE TABLET 3 TIMES A DAY AS NEEDED.   meloxicam  (MOBIC ) 15 MG tablet Take 1 tablet (15 mg total) by mouth daily.   metoprolol  succinate (TOPROL -XL) 25 MG 24 hr tablet Take 1 tablet (25 mg total) by mouth daily.   omeprazole  (PRILOSEC) 20 MG capsule Take 1 capsule (20 mg total) by mouth daily.   rosuvastatin  (CRESTOR ) 10 MG tablet Take 1 tablet (10 mg total) by mouth daily.   traZODone  (DESYREL ) 50 MG tablet Take 0.5-1 tablets (25-50 mg total) by mouth at bedtime as needed for sleep.   triamcinolone  cream (KENALOG ) 0.1 % Apply topically 2 (two) times daily.  No facility-administered encounter medications on file as of 09/30/2023.    Allergies (verified) Patient has no known allergies.   History: Past Medical History:  Diagnosis Date   Alopecia    Anxiety    Arthritis    Asthma    Collagenous colitis    Depression    Diarrhea    chronic- "states for at least a year)   Edema    GERD (gastroesophageal reflux disease)    HOH (hard of hearing)    Hyperlipidemia    Hypertension    Past Surgical History:  Procedure Laterality Date   ABDOMINAL HYSTERECTOMY     one ovary left   CATARACT EXTRACTION W/PHACO Right 02/17/2018   Procedure: CATARACT EXTRACTION PHACO AND INTRAOCULAR  LENS PLACEMENT (IOC);  Surgeon: Tarri Farm, MD;  Location: [facility];  Service: Ophthalmology;  Laterality: Right;  CDE: 4.84   CATARACT EXTRACTION W/PHACO Left 03/31/2018   Procedure: CATARACT EXTRACTION PHACO AND INTRAOCULAR LENS PLACEMENT (IOC) LEFT CDE: 4.51;  Surgeon: Tarri Farm, MD;  Location: [facility];  Service: Ophthalmology;  Laterality: Left;   COLONOSCOPY N/A 12/17/2016   Procedure: COLONOSCOPY;  Surgeon: Ruby Corporal, MD;  Location: [facility];  Service: Endoscopy;  Laterality: N/A;  2:05   SPINE SURGERY  1990s   L spine   Family History  Problem Relation Age of Onset   Alzheimer's disease Mother    Heart disease Mother    Stroke Mother    Blindness Mother    Alcohol abuse Father    Diabetes Brother    Healthy Daughter    Breast cancer Maternal Aunt    Social History   Socioeconomic History   Marital status: Divorced    Spouse name: Not on file   Number of children: 1   Years of education: 10   Highest education level: 10th grade  Occupational History   Occupation: Retired    Comment: Designer, fashion/clothing  Tobacco Use   Smoking status: Former    Current packs/day: 0.00    Average packs/day: 0.3 packs/day for 5.0 years (1.3 ttl pk-yrs)    Types: Cigarettes    Start date: 10/08/1969    Quit date: 10/09/1974    Years since quitting: 49.0    Passive exposure: Past   Smokeless tobacco: Never  Vaping Use   Vaping status: Never Used  Substance and Sexual Activity   Alcohol use: Yes    Alcohol/week: 2.0 standard drinks of alcohol    Types: 2 Glasses of wine per week   Drug use: No   Sexual activity: Yes    Birth control/protection: Surgical  Other Topics Concern   Not on file  Social History Narrative   Lives alone   One dog    Her daughter lives nearby and checks on her often   Social Drivers of Health   Financial Resource Strain: Low Risk  (09/30/2023)   Overall Financial Resource Strain (CARDIA)    Difficulty of Paying Living Expenses: Not hard at all  Food  Insecurity: No Food Insecurity (09/30/2023)   Hunger Vital Sign    Worried About Running Out of Food in the Last Year: Never true    Ran Out of Food in the Last Year: Never true  Transportation Needs: No Transportation Needs (09/30/2023)   PRAPARE - Administrator, Civil Service (Medical): No    Lack of Transportation (Non-Medical): No  Physical Activity: Insufficiently Active (09/30/2023)   Exercise Vital Sign    Days of Exercise per  Week: 3 days    Minutes of Exercise per Session: 20 min  Stress: No Stress Concern Present (09/30/2023)   [employer] of Occupational Health - Occupational Stress Questionnaire    Feeling of Stress : Not at all  Social Connections: Socially Isolated (09/30/2023)   Social Connection and Isolation Panel [NHANES]    Frequency of Communication with Friends and Family: More than three times a week    Frequency of Social Gatherings with Friends and Family: More than three times a week    Attends Religious Services: Never    Database administrator or Organizations: No    Attends Engineer, structural: Never    Marital Status: Divorced    Tobacco Counseling Counseling given: Yes    Clinical Intake:  Pre-visit preparation completed: Yes  Pain : No/denies pain     BMI - recorded: 22.8 Nutritional Status: BMI of 19-24  Normal Nutritional Risks: None Diabetes: No  Lab Results  Component Value Date   HGBA1C 5.3 09/22/2018     How often do you need to have someone help you when you read instructions, pamphlets, or other written materials from your doctor or pharmacy?: 1 - Never  Interpreter Needed?: No  Information entered by :: Raymonde Hamblin Mujtaba Bollig CMA   Activities of Daily Living     09/30/2023   11:33 AM  In your present state of health, do you have any difficulty performing the following activities:  Hearing? 1  Comment had hearing aids in the past but she lost them  Vision? 0  Difficulty concentrating or making decisions? 0   Walking or climbing stairs? 0  Dressing or bathing? 0  Doing errands, shopping? 0  Preparing Food and eating ? N  Using the Toilet? N  In the past six months, have you accidently leaked urine? N  Do you have problems with loss of bowel control? N  Managing your Medications? N  Managing your Finances? N  Housekeeping or managing your Housekeeping? N    Patient Care Team: Dettinger, Lucio Sabin, MD as PCP - General (Family Medicine) Eilleen Grates, MD as PCP - Cardiology (Cardiology) Ruby Corporal, MD (Inactive) as Consulting Physician (Gastroenterology) Pleasant Brilliant, MD as Attending Physician (Orthopedic Surgery)  Indicate any recent Medical Services you may have received from other than [facility] providers in the past year (date may be approximate).     Assessment:   This is a routine wellness examination for Pamela Hughes.  Hearing/Vision screen Hearing Screening - Comments:: Patient has hearing loss bilaterally. Is considering buying an OTC hearing aid for ~$300.  Vision Screening - Comments:: Patient is not up to date on eye exams. Referral placed today.    Goals Addressed             This Visit's Progress    Patient Stated       To stay as healthy as I can.         Depression Screen     09/30/2023   11:42 AM 08/15/2023    3:18 PM 06/15/2023    3:37 PM 11/10/2022    1:05 PM 09/27/2022   12:41 PM 03/15/2022    1:22 PM 01/21/2022    2:18 PM  PHQ 2/9 Scores  PHQ - 2 Score 0 1 1 2  0 0 1  PHQ- 9 Score 0  3 5  2      Fall Risk     09/30/2023   11:37 AM 08/15/2023    3:18 PM 06/15/2023  3:37 PM 11/10/2022    1:05 PM 09/27/2022   12:41 PM  Fall Risk   Falls in the past year? 0 0 0 0 0  Number falls in past yr: 0    0  Injury with Fall? 0    0  Risk for fall due to : No Fall Risks    No Fall Risks  Follow up Falls prevention discussed;Falls evaluation completed    Falls prevention discussed;Education provided;Falls evaluation completed    MEDICARE RISK AT HOME:   Medicare Risk at Home Any stairs in or around the home?: Yes If so, are there any without handrails?: No Home free of loose throw rugs in walkways, pet beds, electrical cords, etc?: Yes Adequate lighting in your home to reduce risk of falls?: Yes Life alert?: No Use of a cane, walker or w/c?: No Grab bars in the bathroom?: Yes Shower chair or bench in shower?: Yes Elevated toilet seat or a handicapped toilet?: No  TIMED UP AND GO:  Was the test performed?  No  Cognitive Function: 6CIT completed    06/06/2017   11:21 AM  MMSE - Mini Mental State Exam  Orientation to time 5  Orientation to Place 5  Registration 3  Attention/ Calculation 5  Recall 3  Language- name 2 objects 2  Language- repeat 1  Language- follow 3 step command 2  Language- read & follow direction 1  Write a sentence 1  Copy design 0  Total score 28        09/30/2023   11:40 AM 09/27/2022   12:42 PM 09/23/2021    2:06 PM 09/22/2020    3:03 PM 09/20/2019    2:47 PM  6CIT Screen  What Year? 0 points 0 points 0 points 0 points 0 points  What month? 0 points 0 points 0 points 0 points 0 points  What time? 0 points 0 points 0 points 0 points 0 points  Count back from 20 0 points 0 points 0 points 0 points 0 points  Months in reverse 0 points 2 points 4 points 0 points 2 points  Repeat phrase 0 points 4 points 2 points 2 points 0 points  Total Score 0 points 6 points 6 points 2 points 2 points    Immunizations Immunization History  Administered Date(s) Administered   Fluad Quad(high Dose 65+) 02/25/2020, 03/04/2021   Fluad Trivalent(High Dose 65+) 06/15/2023   Influenza, High Dose Seasonal PF 02/25/2016, 03/03/2017, 03/21/2018, 03/22/2019   Influenza,inj,Quad PF,6+ Mos 02/27/2015   Influenza-Unspecified 03/18/2014   Moderna Sars-Covid-2 Vaccination 09/25/2019, 11/12/2019   Pneumococcal Polysaccharide-23 02/25/2020   Tdap 02/25/2020   Zoster Recombinant(Shingrix ) 09/02/2021, 11/10/2022   Zoster,  Unspecified 09/02/2021    Screening Tests Health Maintenance  Topic Date Due   COVID-19 Vaccine (3 - 2024-25 season) 01/30/2023   DEXA SCAN  03/05/2023   Pneumonia Vaccine 36+ Years old (2 of 2 - PCV) 11/10/2023 (Originally 02/24/2021)   MAMMOGRAM  12/22/2023   INFLUENZA VACCINE  12/30/2023   Medicare Annual Wellness (AWV)  09/29/2024   DTaP/Tdap/Td (2 - Td or Tdap) 02/24/2030   Zoster Vaccines- Shingrix   Completed   HPV VACCINES  Aged Out   Meningococcal B Vaccine  Aged Out    Health Maintenance  Health Maintenance Due  Topic Date Due   COVID-19 Vaccine (3 - 2024-25 season) 01/30/2023   DEXA SCAN  03/05/2023   Health Maintenance Items Addressed: Mammogram ordered, Referral sent to Optometry/Ophthalmology  Additional Screening:  Vision Screening: Recommended  annual ophthalmology exams for early detection of glaucoma and other disorders of the eye.  Dental Screening: Recommended annual dental exams for proper oral hygiene  Community Resource Referral / Chronic Care Management: CRR required this visit?  No   CCM required this visit?  No     Plan:     I have personally reviewed and noted the following in the patient's chart:   Medical and social history Use of alcohol, tobacco or illicit drugs  Current medications and supplements including opioid prescriptions. Patient is not currently taking opioid prescriptions. Functional ability and status Nutritional status Physical activity Advanced directives List of other physicians Hospitalizations, surgeries, and ER visits in previous 12 months Vitals Screenings to include cognitive, depression, and falls Referrals and appointments  In addition, I have reviewed and discussed with patient certain preventive protocols, quality metrics, and best practice recommendations. A written personalized care plan for preventive services as well as general preventive health recommendations were provided to patient.     Tempestt Silba  Xylon Croom, CMA   09/30/2023   After Visit Summary: (MyChart) Due to this being a telephonic visit, the after visit summary with patients personalized plan was offered to patient via MyChart   Notes: Please refer to Routing Comments.

## 2023-09-30 NOTE — Patient Instructions (Signed)
 Pamela Hughes , Thank you for taking time to come for your Medicare Wellness Visit. I appreciate your ongoing commitment to your health goals. Please review the following plan we discussed and let me know if I can assist you in the future.   Referrals/Orders/Follow-Ups/Clinician Recommendations:    Next Medicare AWV: Oct 01, 2024 at 12:30 pm video visit.    [x]  You have been referred to [facility] in [place] for an eye exam. If you have not heard from the office in 5-7 business days, please call the number below to schedule your first appointment.  [facility] [address] [place], [place] [address] Phone: [phone]   You have an order for:  [x]   3D Mammogram  Please call for appointment:  [facility] [phone] Make sure to wear two-piece clothing.  No lotions powders or deodorants the day of the appointment Make sure to bring picture ID and insurance card.  Bring list of medications you are currently taking including any supplements.      Aim for 30 minutes of exercise or brisk walking, 6-8 glasses of water , and 5 servings of fruits and vegetables each day.   This is a list of the screening recommended for you and due dates:  Health Maintenance  Topic Date Due   COVID-19 Vaccine (3 - 2024-25 season) 01/30/2023   DEXA scan (bone density measurement)  03/05/2023   Pneumonia Vaccine (2 of 2 - PCV) 11/10/2023*   Mammogram  12/22/2023   Flu Shot  12/30/2023   Medicare Annual Wellness Visit  09/29/2024   DTaP/Tdap/Td vaccine (2 - Td or Tdap) 02/24/2030   Zoster (Shingles) Vaccine  Completed   HPV Vaccine  Aged Out   Meningitis B Vaccine  Aged Out  *Topic was postponed. The date shown is not the original due date.    Advanced directives: (Declined) Advance directive discussed with you today. Even though you declined this today, please call our office should you change your mind, and we can give you the proper paperwork for you to fill out. Advance Care  Planning is important because it:  [x]  Makes sure you receive the medical care that is consistent with your values, goals, and preferences  [x]  It provides guidance to your family and loved ones and it also reduces their decisional burden about whether or not they are making the right decisions based on what you want done  Follow the link provided in your after visit summary or read over the paperwork we have mailed to you to help you started getting your Advance Directives in place. If you need assistance in completing these, please reach out to us  so that we can help you!  Next Medicare Annual Wellness Visit scheduled for next year: yes  Understanding Your Risk for Falls Millions of people have serious injuries from falls each year. It is important to understand your risk of falling. Talk with your health care provider about your risk and what you can do to lower it. If you do have a serious fall, make sure to tell your provider. Falling once raises your risk of falling again. How can falls affect me? Serious injuries from falls are common. These include: Broken bones, such as hip fractures. Head injuries, such as traumatic brain injuries (TBI) or concussions. A fear of falling can cause you to avoid activities and stay at home. This can make your muscles weaker and raise your risk for a fall. What can increase my  risk? There are a number of risk factors that increase your risk for falling. The more risk factors you have, the higher your risk of falling. Serious injuries from a fall happen most often to people who are older than 83 years old. Teenagers and young adults ages 106-29 are also at higher risk. Common risk factors include: Weakness in the lower body. Being generally weak or confused due to long-term (chronic) illness. Dizziness or balance problems. Poor vision. Medicines that cause dizziness or drowsiness. These may include: Medicines for your blood pressure, heart, anxiety,  insomnia, or swelling (edema). Pain medicines. Muscle relaxants. Other risk factors include: Drinking alcohol. Having had a fall in the past. Having foot pain or wearing improper footwear. Working at a dangerous job. Having any of the following in your home: Tripping hazards, such as floor clutter or loose rugs. Poor lighting. Pets. Having dementia or memory loss. What actions can I take to lower my risk of falling?  Physical activity Stay physically fit. Do strength and balance exercises. Consider taking a regular class to build strength and balance. Yoga and tai chi are good options. Vision Have your eyes checked every year and your prescription for glasses or contacts updated as needed. Shoes and walking aids Wear non-skid shoes. Wear shoes that have rubber soles and low heels. Do not wear high heels. Do not walk around the house in socks or slippers. Use a cane or walker as told by your provider. Home safety Attach secure railings on both sides of your stairs. Install grab bars for your bathtub, shower, and toilet. Use a non-skid mat in your bathtub or shower. Attach bath mats securely with double-sided, non-slip rug tape. Use good lighting in all rooms. Keep a flashlight near your bed. Make sure there is a clear path from your bed to the bathroom. Use night-lights. Do not use throw rugs. Make sure all carpeting is taped or tacked down securely. Remove all clutter from walkways and stairways, including extension cords. Repair uneven or broken steps and floors. Avoid walking on icy or slippery surfaces. Walk on the grass instead of on icy or slick sidewalks. Use ice melter to get rid of ice on walkways in the winter. Use a cordless phone. Questions to ask your health care provider Can you help me check my risk for a fall? Do any of my medicines make me more likely to fall? Should I take a vitamin D supplement? What exercises can I do to improve my strength and  balance? Should I make an appointment to have my vision checked? Do I need a bone density test to check for weak bones (osteoporosis)? Would it help to use a cane or a walker? Where to find more information Centers for Disease Control and Prevention, STEADI: TonerPromos.no Community-Based Fall Prevention Programs: TonerPromos.no General Bhakta on Aging: BaseRingTones.pl Contact a health care provider if: You fall at home. You are afraid of falling at home. You feel weak, drowsy, or dizzy. This information is not intended to replace advice given to you by your health care provider. Make sure you discuss any questions you have with your health care provider. Document Revised: 01/18/2022 Document Reviewed: 01/18/2022 Elsevier Patient Education  2024 [employer].

## 2023-10-26 ENCOUNTER — Other Ambulatory Visit: Payer: Self-pay | Admitting: Family Medicine

## 2023-10-26 DIAGNOSIS — L6612 Frontal fibrosing alopecia: Secondary | ICD-10-CM

## 2023-10-26 DIAGNOSIS — E785 Hyperlipidemia, unspecified: Secondary | ICD-10-CM

## 2023-10-31 ENCOUNTER — Ambulatory Visit (HOSPITAL_COMMUNITY)
Admission: RE | Admit: 2023-10-31 | Discharge: 2023-10-31 | Disposition: A | Discharge status: Home / Self Care | Source: Ambulatory Visit | Attending: Cardiology | Admitting: Cardiology

## 2023-10-31 DIAGNOSIS — I350 Nonrheumatic aortic (valve) stenosis: Secondary | ICD-10-CM | POA: Diagnosis not present

## 2023-10-31 LAB — ECHOCARDIOGRAM COMPLETE
AR max vel: 1.2 cm2
AV Area VTI: 1.18 cm2
AV Area mean vel: 1.24 cm2
AV Mean grad: 15 mmHg
AV Peak grad: 24.2 mmHg
Ao pk vel: 2.46 m/s
Area-P 1/2: 4.68 cm2
MV M vel: 5.26 m/s
MV Peak grad: 110.7 mmHg
P 1/2 time: 503 ms
S' Lateral: 3.2 cm

## 2023-10-31 NOTE — Progress Notes (Signed)
*  PRELIMINARY RESULTS* Echocardiogram 2D Echocardiogram has been performed.  Pamela Hughes 10/31/2023, 4:01 PM

## 2023-11-06 ENCOUNTER — Ambulatory Visit: Payer: Self-pay | Admitting: Cardiology

## 2023-12-14 ENCOUNTER — Encounter: Payer: Self-pay | Admitting: Family Medicine

## 2023-12-14 ENCOUNTER — Ambulatory Visit: Payer: 59 | Admitting: Family Medicine

## 2023-12-14 ENCOUNTER — Ambulatory Visit: Payer: 59

## 2023-12-14 VITALS — BP 187/76 | HR 61 | Ht 65.0 in | Wt 135.0 lb

## 2023-12-14 DIAGNOSIS — I10 Essential (primary) hypertension: Secondary | ICD-10-CM

## 2023-12-14 DIAGNOSIS — E785 Hyperlipidemia, unspecified: Secondary | ICD-10-CM | POA: Diagnosis not present

## 2023-12-14 DIAGNOSIS — F32A Depression, unspecified: Secondary | ICD-10-CM

## 2023-12-14 DIAGNOSIS — F3341 Major depressive disorder, recurrent, in partial remission: Secondary | ICD-10-CM | POA: Diagnosis not present

## 2023-12-14 DIAGNOSIS — R413 Other amnesia: Secondary | ICD-10-CM

## 2023-12-14 LAB — LIPID PANEL

## 2023-12-14 MED ORDER — METOPROLOL SUCCINATE ER 25 MG PO TB24
25.0000 mg | ORAL_TABLET | Freq: Every day | ORAL | 3 refills | Status: AC
Start: 2023-12-14 — End: ?

## 2023-12-14 MED ORDER — BUPROPION HCL ER (SR) 150 MG PO TB12: 150.0000 mg | ORAL_TABLET | Freq: Every morning | ORAL | 3 refills | Status: AC

## 2023-12-14 MED ORDER — ROSUVASTATIN CALCIUM 10 MG PO TABS: 10.0000 mg | ORAL_TABLET | Freq: Every day | ORAL | 3 refills | Status: AC

## 2023-12-14 MED ORDER — AMLODIPINE BESYLATE 10 MG PO TABS: 10.0000 mg | ORAL_TABLET | Freq: Every day | ORAL | 3 refills | Status: AC

## 2023-12-14 MED ORDER — OMEPRAZOLE 20 MG PO CPDR: 20.0000 mg | DELAYED_RELEASE_CAPSULE | Freq: Every day | ORAL | 3 refills | Status: AC

## 2023-12-14 MED ORDER — DONEPEZIL HCL 5 MG PO TBDP: 5.0000 mg | ORAL_TABLET | Freq: Every day | ORAL | 3 refills | Status: AC

## 2023-12-14 NOTE — Progress Notes (Addendum)
 BP (!) 187/76   Pulse 61   Ht 5' 5 (1.651 m)   Wt 135 lb (61.2 kg)   SpO2 96%   BMI 22.47 kg/m    Subjective:   Patient ID: Pamela Hughes, female    DOB: 17-Jan-1941, 83 y.o.   MRN: [id number]  HPI: Pamela Hughes is a 83 y.o. female presenting on 12/14/2023 for Medical Management of Chronic Issues and Hypertension   HPI Hypertension Patient is currently on metoprolol , and their blood pressure today is 187/76. Patient denies any lightheadedness or dizziness. Patient denies headaches, blurred vision, chest pains, shortness of breath, or weakness. Denies any side effects from medication and is content with current medication.   Hyperlipidemia Patient is coming in for recheck of his hyperlipidemia. The patient is currently taking Crestor . They deny any issues with myalgias or history of liver damage from it. They deny any focal numbness or weakness or chest pain.   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  She currently takes trazodone  and Aricept  and Wellbutrin .  Seems to be stable per her and per her cousin who has come with her for    12/14/2023    1:28 PM 12/14/2023    1:27 PM 09/30/2023   11:42 AM 08/15/2023    3:18 PM 06/15/2023    3:37 PM  Depression screen PHQ 2/9  Decreased Interest  0 0 0 0  Down, Depressed, Hopeless  1 0 1 1  PHQ - 2 Score  1 0 1 1  Altered sleeping 1  0  1  Tired, decreased energy 3  0  1  Change in appetite 1  0  0  Feeling bad or failure about yourself  0  0  0  Trouble concentrating 0  0  0  Moving slowly or fidgety/restless 0  0  0  Suicidal thoughts 0  0  0  PHQ-9 Score   0  3  Difficult doing work/chores Not difficult at all  Not difficult at all  Not difficult at all     Relevant past medical, surgical, family and social history reviewed and updated as indicated. Interim medical history since our last visit reviewed. Allergies and medications  reviewed and updated.  Review of Systems  Constitutional:  Negative for chills and fever.  Eyes:  Negative for visual disturbance.  Respiratory:  Negative for chest tightness and shortness of breath.   Cardiovascular:  Negative for chest pain and leg swelling.  Musculoskeletal:  Negative for back pain and gait problem.  Skin:  Negative for rash.  Neurological:  Negative for light-headedness and headaches.  Psychiatric/Behavioral:  Negative for agitation and behavioral problems.   All other systems reviewed and are negative.   Per HPI unless specifically indicated above   Allergies as of 12/14/2023   No Known Allergies      Medication List        Accurate as of December 14, 2023  1:39 PM. If you have any questions, ask your nurse or doctor.          albuterol  108 (90 Base) MCG/ACT inhaler Commonly known as: VENTOLIN  HFA USE 2 PUFFS EVERY 6 HOURS AS NEEDED   amLODipine  10 MG tablet Commonly known as: NORVASC  Take 1 tablet (10 mg total) by mouth daily. What changed:  medication strength how much to take Changed by: Fonda LABOR Brave Dack   Budesonide  ER 9 MG  Tb24 Commonly known as: Uceris  Take 1 tablet (9 mg total) by mouth daily.   buPROPion  150 MG 12 hr tablet Commonly known as: WELLBUTRIN  SR Take 1 tablet (150 mg total) by mouth every morning.   donepezil  5 MG disintegrating tablet Commonly known as: ARICEPT  ODT Take 1 tablet (5 mg total) by mouth at bedtime.   ipratropium 0.03 % nasal spray Commonly known as: ATROVENT  USE 2 SPRAYS IN EACH NOSTRIL TWICE DAILYAS DIRECTED   meclizine  25 MG tablet Commonly known as: ANTIVERT  TAKE ONE TABLET 3 TIMES A DAY AS NEEDED.   meloxicam  15 MG tablet Commonly known as: MOBIC  Take 1 tablet (15 mg total) by mouth daily.   metoprolol  succinate 25 MG 24 hr tablet Commonly known as: TOPROL -XL Take 1 tablet (25 mg total) by mouth daily.   omeprazole  20 MG capsule Commonly known as: PRILOSEC Take 1 capsule (20 mg total) by  mouth daily.   rosuvastatin  10 MG tablet Commonly known as: CRESTOR  Take 1 tablet (10 mg total) by mouth daily. What changed: how much to take Changed by: Fonda LABOR Regenia Erck   traZODone  50 MG tablet Commonly known as: DESYREL  Take 0.5-1 tablets (25-50 mg total) by mouth at bedtime as needed for sleep.   triamcinolone  cream 0.1 % Commonly known as: KENALOG  APPLY TWICE DAILY         Objective:   BP (!) 187/76   Pulse 61   Ht 5' 5 (1.651 m)   Wt 135 lb (61.2 kg)   SpO2 96%   BMI 22.47 kg/m   Wt Readings from Last 3 Encounters:  12/14/23 135 lb (61.2 kg)  09/30/23 137 lb (62.1 kg)  09/23/23 137 lb 3.2 oz (62.2 kg)    Physical Exam Vitals and nursing note reviewed.  Constitutional:      General: She is not in acute distress.    Appearance: She is well-developed. She is not diaphoretic.  Eyes:     Conjunctiva/sclera: Conjunctivae normal.  Cardiovascular:     Rate and Rhythm: Normal rate and regular rhythm.     Heart sounds: Normal heart sounds. No murmur heard. Pulmonary:     Effort: Pulmonary effort is normal. No respiratory distress.     Breath sounds: Normal breath sounds. No wheezing.  Musculoskeletal:        General: No swelling.  Skin:    General: Skin is warm and dry.     Findings: No rash.  Neurological:     Mental Status: She is alert and oriented to person, place, and time.     Coordination: Coordination normal.  Psychiatric:        Behavior: Behavior normal.       Assessment & Plan:   Problem List Items Addressed This Visit       Cardiovascular and Mediastinum   Hypertension - Primary   Relevant Medications   metoprolol  succinate (TOPROL -XL) 25 MG 24 hr tablet   rosuvastatin  (CRESTOR ) 10 MG tablet   amLODipine  (NORVASC ) 10 MG tablet   Other Relevant Orders   CBC with Differential/Platelet   CMP14+EGFR   Lipid panel     Other   Depression   Relevant Medications   buPROPion  (WELLBUTRIN  SR) 150 MG 12 hr tablet   Dyslipidemia (high  LDL; low HDL)   Relevant Medications   rosuvastatin  (CRESTOR ) 10 MG tablet   Other Relevant Orders   CBC with Differential/Platelet   CMP14+EGFR   Lipid panel   Memory disorder   Other Visit Diagnoses  Memory deficit       Relevant Medications   donepezil  (ARICEPT  ODT) 5 MG disintegrating tablet       Seems to be doing well for the most part, will do blood work today.  No change medication.  Increase blood pressure medicine to amlodipine  10 mg Follow up plan: Return in about 6 months (around 06/15/2024), or if symptoms worsen or fail to improve, for Hypertension and dyslipidemia.  Counseling provided for all of the vaccine components Orders Placed This Encounter  Procedures   CBC with Differential/Platelet   CMP14+EGFR   Lipid panel    Fonda Levins, MD [facility] 12/14/2023, 1:39 PM

## 2023-12-15 LAB — CBC WITH DIFFERENTIAL/PLATELET
Basophils Absolute: 0.1 x10E3/uL (ref 0.0–0.2)
Basos: 1 %
EOS (ABSOLUTE): 0.2 x10E3/uL (ref 0.0–0.4)
Eos: 2 %
Hematocrit: 37.8 % (ref 34.0–46.6)
Hemoglobin: 12.6 g/dL (ref 11.1–15.9)
Immature Grans (Abs): 0 x10E3/uL (ref 0.0–0.1)
Immature Granulocytes: 0 %
Lymphocytes Absolute: 2.2 x10E3/uL (ref 0.7–3.1)
Lymphs: 30 %
MCH: 33.1 pg — ABNORMAL HIGH (ref 26.6–33.0)
MCHC: 33.3 g/dL (ref 31.5–35.7)
MCV: 99 fL — ABNORMAL HIGH (ref 79–97)
Monocytes Absolute: 0.8 x10E3/uL (ref 0.1–0.9)
Monocytes: 11 %
Neutrophils Absolute: 4 x10E3/uL (ref 1.4–7.0)
Neutrophils: 56 %
Platelets: 315 x10E3/uL (ref 150–450)
RBC: 3.81 x10E6/uL (ref 3.77–5.28)
RDW: 11.9 % (ref 11.7–15.4)
WBC: 7.3 x10E3/uL (ref 3.4–10.8)

## 2023-12-15 LAB — LIPID PANEL
Cholesterol, Total: 228 mg/dL — AB (ref 100–199)
HDL: 111 mg/dL (ref >39)
LDL CALC COMMENT:: 2.1 ratio (ref 0.0–4.4)
LDL Chol Calc (NIH): 101 mg/dL — AB (ref 0–99)
Triglycerides: 97 mg/dL (ref 0–149)
VLDL Cholesterol Cal: 16 mg/dL (ref 5–40)

## 2023-12-15 LAB — CMP14+EGFR
ALT: 16 IU/L (ref 0–32)
AST: 24 IU/L (ref 0–40)
Albumin: 4.4 g/dL (ref 3.7–4.7)
Alkaline Phosphatase: 110 IU/L (ref 44–121)
BUN/Creatinine Ratio: 18 (ref 12–28)
BUN: 22 mg/dL (ref 8–27)
Bilirubin Total: 0.3 mg/dL (ref 0.0–1.2)
CO2: 21 mmol/L (ref 20–29)
Calcium: 9.2 mg/dL (ref 8.7–10.3)
Chloride: 96 mmol/L (ref 96–106)
Creatinine, Ser: 1.19 mg/dL — AB (ref 0.57–1.00)
Globulin, Total: 2.1 g/dL (ref 1.5–4.5)
Glucose: 87 mg/dL (ref 70–99)
Potassium: 4.4 mmol/L (ref 3.5–5.2)
Sodium: 136 mmol/L (ref 134–144)
Total Protein: 6.5 g/dL (ref 6.0–8.5)
eGFR: 45 mL/min/1.73 — AB (ref >59)

## 2023-12-22 ENCOUNTER — Ambulatory Visit: Payer: Self-pay | Admitting: Family Medicine

## 2024-01-17 ENCOUNTER — Other Ambulatory Visit: Payer: Self-pay | Admitting: Family Medicine

## 2024-01-26 ENCOUNTER — Ambulatory Visit (INDEPENDENT_AMBULATORY_CARE_PROVIDER_SITE_OTHER)

## 2024-01-26 DIAGNOSIS — Z780 Asymptomatic menopausal state: Secondary | ICD-10-CM | POA: Diagnosis not present

## 2024-02-01 ENCOUNTER — Other Ambulatory Visit: Payer: Self-pay | Admitting: Family Medicine

## 2024-02-01 DIAGNOSIS — L6612 Frontal fibrosing alopecia: Secondary | ICD-10-CM

## 2024-02-01 DIAGNOSIS — Z780 Asymptomatic menopausal state: Secondary | ICD-10-CM

## 2024-02-01 DIAGNOSIS — Z23 Encounter for immunization: Secondary | ICD-10-CM

## 2024-02-01 DIAGNOSIS — F3341 Major depressive disorder, recurrent, in partial remission: Secondary | ICD-10-CM

## 2024-02-01 DIAGNOSIS — E785 Hyperlipidemia, unspecified: Secondary | ICD-10-CM

## 2024-02-01 DIAGNOSIS — I10 Essential (primary) hypertension: Secondary | ICD-10-CM

## 2024-02-01 DIAGNOSIS — Z13820 Encounter for screening for osteoporosis: Secondary | ICD-10-CM

## 2024-02-03 DIAGNOSIS — Z780 Asymptomatic menopausal state: Secondary | ICD-10-CM | POA: Diagnosis not present

## 2024-02-03 DIAGNOSIS — M8589 Other specified disorders of bone density and structure, multiple sites: Secondary | ICD-10-CM | POA: Diagnosis not present

## 2024-02-10 ENCOUNTER — Telehealth: Payer: Self-pay | Admitting: Family Medicine

## 2024-02-10 NOTE — Telephone Encounter (Signed)
 Copied from CRM [id number]. Topic: Clinical - Lab/Test Results >> Feb 10, 2024  2:55 PM Wess RAMAN wrote: Reason for CRM: Patient would like to go over her bone density results

## 2024-02-13 ENCOUNTER — Ambulatory Visit: Payer: Self-pay | Admitting: Family Medicine

## 2024-02-13 NOTE — Telephone Encounter (Signed)
 Please review results.

## 2024-02-22 ENCOUNTER — Ambulatory Visit
Admission: RE | Admit: 2024-02-22 | Discharge: 2024-02-22 | Disposition: A | Discharge status: Home / Self Care | Source: Ambulatory Visit | Attending: Family Medicine | Admitting: Family Medicine

## 2024-02-22 DIAGNOSIS — Z1231 Encounter for screening mammogram for malignant neoplasm of breast: Secondary | ICD-10-CM | POA: Diagnosis not present

## 2024-02-22 DIAGNOSIS — Z0000 Encounter for general adult medical examination without abnormal findings: Secondary | ICD-10-CM

## 2024-04-18 ENCOUNTER — Other Ambulatory Visit: Payer: Self-pay | Admitting: Family Medicine

## 2024-04-18 DIAGNOSIS — L6612 Frontal fibrosing alopecia: Secondary | ICD-10-CM

## 2024-04-18 DIAGNOSIS — G4700 Insomnia, unspecified: Secondary | ICD-10-CM

## 2024-06-11 ENCOUNTER — Telehealth: Payer: Self-pay | Admitting: Family Medicine

## 2024-06-11 NOTE — Telephone Encounter (Signed)
 Copied from CRM #[id number]. Topic: General - Call Back - No Documentation >> Jun 11, 2024 12:42 PM Cherylann RAMAN wrote: Reason for CRM: Patient returning call to office. No notes provided. Please contact patient at [phone].

## 2024-06-11 NOTE — Telephone Encounter (Signed)
 No call is documented that was called from here.

## 2024-06-15 NOTE — Telephone Encounter (Signed)
 Patient stated that she is checking who called her. I informed her that no call is documented from [facility].

## 2024-06-18 ENCOUNTER — Ambulatory Visit: Payer: Self-pay | Admitting: Family Medicine

## 2024-06-19 ENCOUNTER — Encounter: Payer: Self-pay | Admitting: Family Medicine

## 2024-10-01 ENCOUNTER — Ambulatory Visit: Payer: Self-pay

## 2024-10-16 ENCOUNTER — Ambulatory Visit
